# Patient Record
Sex: Female | Born: 1980
Health system: Southern US, Community
[De-identification: ages and names within clinical notes are randomized; demographics above are authoritative.]

## PROBLEM LIST (undated history)

## (undated) DIAGNOSIS — M940 Chondrocostal junction syndrome [Tietze]: Secondary | ICD-10-CM

## (undated) DIAGNOSIS — S83249A Other tear of medial meniscus, current injury, unspecified knee, initial encounter: Secondary | ICD-10-CM

## (undated) DIAGNOSIS — K921 Melena: Secondary | ICD-10-CM

## (undated) DIAGNOSIS — K649 Unspecified hemorrhoids: Secondary | ICD-10-CM

## (undated) DIAGNOSIS — F329 Major depressive disorder, single episode, unspecified: Secondary | ICD-10-CM

## (undated) DIAGNOSIS — F32A Depression, unspecified: Secondary | ICD-10-CM

## (undated) DIAGNOSIS — S83519A Sprain of anterior cruciate ligament of unspecified knee, initial encounter: Secondary | ICD-10-CM

## (undated) DIAGNOSIS — R590 Localized enlarged lymph nodes: Secondary | ICD-10-CM

## (undated) DIAGNOSIS — Z9289 Personal history of other medical treatment: Secondary | ICD-10-CM

## (undated) DIAGNOSIS — D509 Iron deficiency anemia, unspecified: Secondary | ICD-10-CM

## (undated) DIAGNOSIS — D649 Anemia, unspecified: Secondary | ICD-10-CM

## (undated) DIAGNOSIS — R112 Nausea with vomiting, unspecified: Secondary | ICD-10-CM

## (undated) DIAGNOSIS — K922 Gastrointestinal hemorrhage, unspecified: Secondary | ICD-10-CM

## (undated) DIAGNOSIS — Z8719 Personal history of other diseases of the digestive system: Secondary | ICD-10-CM

## (undated) DIAGNOSIS — Z9889 Other specified postprocedural states: Secondary | ICD-10-CM

## (undated) DIAGNOSIS — F419 Anxiety disorder, unspecified: Secondary | ICD-10-CM

## (undated) DIAGNOSIS — Z973 Presence of spectacles and contact lenses: Secondary | ICD-10-CM

## (undated) DIAGNOSIS — N939 Abnormal uterine and vaginal bleeding, unspecified: Secondary | ICD-10-CM

## (undated) HISTORY — PX: HYSTEROSCOPY WITH D & C: SHX1775

## (undated) HISTORY — PX: MULTIPLE TOOTH EXTRACTIONS: SHX2053

## (undated) HISTORY — DX: Anemia, unspecified: D64.9

## (undated) HISTORY — PX: KNEE ARTHROSCOPY WITH ANTERIOR CRUCIATE LIGAMENT (ACL) REPAIR: SHX5644

## (undated) HISTORY — DX: Gastrointestinal hemorrhage, unspecified: K92.2

## (undated) NOTE — *Deleted (*Deleted)
HEMATOLOGY/ONCOLOGY CONSULTATION NOTE  Date of Service: 04/18/2020  Patient Care Team: Cain Saupe, MD as PCP - General (Family Medicine)  CHIEF COMPLAINTS/PURPOSE OF CONSULTATION:  Pelvic Lymphadenopathy  HISTORY OF PRESENTING ILLNESS:   Leah Baker is a wonderful 42 y.o. female who has been referred to [place] by Dr Andrey Farmer for evaluation and management of pelvic lymphadenopathy. The pt reports that she is doing well overall.   The pt reports that her pelvic lymphadenopathy was found incidentally while she was being checked for other abdominal pains. In February pt was seeing blood in her stools and had to be hospitalized due to severe blood loss. Pt's 04/20 Colonoscopy revealed that she did have internal hemorrhoids that were bleeding. Dr. Levora Angel did not think that the pt needed an endoscopy because they believe that her internal hemorrhoid is the sole source of her GI bleeding. She also has a history of menorrhagia, for which she has had to have multiple blood transfusions. Dr. Andrey Farmer started her on Megace in July, her dosage has since been increased. Pt's pelvic region is sensitive to touch and the pain is constant. She reports that the pain is centralized in her hip and does not feel like it's going down her leg. Pt denies right leg swelling or injuries to the right side/leg. It does not hurt when she lifts her leg up, down or out. She does note mild pain when she moves her leg in, towards the center of her body. Heat helps ease the pain, so she often walks around with a heating pad and intercourse makes the pain worse. Dr. Delford Field has given her no indication of what is causing her pain. Pt also experiences non-radiating lower back pain that could be related. Pt had a chest CT in 11/2018 due to chest pains after she was moving heavy objects around but nothing of note was found. Her chest pains have since resolved. Pt reports some hematuria but denies any history of UTIs. She is planning on  seeing a Urologist soon. She has no family history of any autoimmune conditions but does have a large family history of cancer.   Pt can not take Ibuprofen due to bleeding concerns so she has had to take several different pain medications. She was last on Vicodin which helped but she has since run out. Dr. Andrey Farmer was trying to discern if most of her pain was due to her menstrual cramps and wanted to do an endometrial ablation but was told to wait until more is discovered about pt's inguinal lymphadenopathy. Pt has been taking Ferrous Sulfate 4x per day and Metamucil. Pt has no issues passing her bowels. Pt was given Amoxicillin & another antibiotic for about 7 days. She was given no reason why they were giving her antibiotics and it did not help her pain.   She reports that she has had a low-grade fever since Thursday and has had very bad night sweats for the past month. The night sweats appear to be due to the increase in her Megace dosing. She has not had any abnormal vaginal discharge or any boils or bumps in her right pelvic region. Pt has not noticed any lymph node growth or discomfort in the cervical region. She has been helping her children with their at-home schooling and has some new stress due to her father's medical concerns.   Of note prior to the patient's visit today, pt has had Pelvis MRI ([id number]) completed on 02/22/2019 with results revealing "Persistent right inguinal  and iliac adenopathy, previously biopsied and within 1-2 mm of size when compared to 09/14/2018. Significance uncertain given unilateral enlargement, unusual for pelvic nodes with reactive changes. Imaging appearance remains nonspecific but again raises the question of lymphoproliferative disorder. Correlate with previous biopsy results with imaging and clinical follow-up as warranted. Mass on ultrasound likely represented pelvic sidewall/external iliac nodal enlargement on the right."   Pt has had US Pelvic ([id number])  completed on 12/16/2018 with results revealing "1. RIGHT adnexal mass, discrete from the RIGHT ovary is 1.9 centimeters and avascular on Doppler evaluation. This likely represents enlarged RIGHT external iliac lymph node as seen on previous CT exams of the abdomen and pelvis 09/14/2018 and 08/31/2018. 2. Given the persistence of RIGHT iliac lymph node enlargement, consider biopsy of RIGHT inguinal lymph node, seen on prior CT exam. 3. Normal endometrial thickness. If bleeding remains unresponsive to hormonal or medical therapy, sonohysterogram should be considered for focal lesion work-up. (Ref: Radiological Reasoning: Algorithmic  Workup of Abnormal Vaginal Bleeding with Endovaginal Sonography and Sonohysterography. AJR 2008; [id number]:[id number])."  Pt has had Lymph node biopsy (right groin) ([id number]) completed on 09/29/2018 with results revealing "LYMPHOID HYPERPLASIA."  Most recent lab results (02/10/2019) of CBC w/diff is as follows: all values are WNL except for RDW at 18.3, PLTs at 457K. 12/08/2018 BMP is as follows: all values are WNL except for CO2 at 18.   On review of systems, pt reports fevers, night sweats, pelvic pain and denies chills, SOB, coughing, runny nose, bloody stools, new lumps/bumps, abdominal pain, leg swelling, calf pain and any other symptoms.   On PMHx the pt reports menorrhagia, lower GI bleeding On Family Hx the pt reports her great aunt passed from liver cancer, her cousin currently has lung cancer, her mother had a mass in her breast (Removed)  INTERVAL HISTORY: Leah Baker is a wonderful 48 y.o. female who is here for evaluation and management of pelvic lymphadenopathy. The patient's last visit with [place] was on 02/28/2019. The pt reports that she is doing well overall.  The pt reports ***  Of note since the patient's last visit, pt has had MRI Abd ([id number]) completed on 02/29/2020 with results revealing "Stable 1.5 cm mass in segment 2 of the left hepatic lobe, with  characteristics suggestive of benign focal nodular hyperplasia."  Pt has had MRI Pel ([id number]) completed on 03/20/2020 with results revealing "1. Stable enlarged right-sided pelvic and inguinal lymph nodes. 2. No new or progressive findings."  Pt has had Flow Pathology Report ([id number]) completed on 04/12/2020 with results revealing "No monoclonal B-cell population or abnormal T-cell phenotype identified."  Pt has also had Right Inguinal LN Surgical Pathology [id number]) completed on 04/12/2020 with results revealing "Florid lymphoid hyperplasia."  Lab results today (04/19/20) of CBC w/diff and CMP is as follows: all values are WNL except for ***. 04/19/2020 Ferritin at *** 04/19/2020 Iron Panel is as follows: ***  On review of systems, pt reports *** and denies *** and any other symptoms.   A&P: -Discussed pt labwork today, 04/19/20; *** -***   MEDICAL HISTORY:  Past Medical History:  Diagnosis Date  . Acute costochondritis 11/08/2018   anterior chest wall pain  per pt pcp note in epic on 11-10-2018  . Anxiety   . Costochondritis, acute 11/10/2018  . Depression   . Hemorrhoids   . History of lower GI bleeding 08/31/2018   s/p  colonoscopy 09-01-2018,  hemorrhoids  . IDA (iron deficiency anemia)   . Inguinal lymphadenopathy  CT 09-14-2018  right side, followed by pcp  . PONV (postoperative nausea and vomiting)    only happened once  . Wears glasses     SURGICAL HISTORY: Past Surgical History:  Procedure Laterality Date  . BIOPSY  09/01/2018   Procedure: BIOPSY;  Surgeon: Kathi Der, MD;  Location: [facility];  Service: Gastroenterology;;  . CESAREAN SECTION  01/15/2002   @[facility]   . CESAREAN SECTION  03/17/2011   Procedure: CESAREAN SECTION;  Surgeon: Tereso Newcomer, MD;  Location: [facility];  Service: Gynecology;  Laterality: N/A;  . COLONOSCOPY WITH PROPOFOL N/A 09/01/2018   Procedure: COLONOSCOPY WITH PROPOFOL;  Surgeon: Kathi Der, MD;  Location:  [facility];  Service: Gastroenterology;  Laterality: N/A;  . HYSTEROSCOPY WITH D & C  06-21-2009  dr constant @[facility]    w/  REMOVAL IUD   . INGUINAL LYMPH NODE BIOPSY Right 04/12/2020   Procedure: RIGHT INGUINAL LYMPH NODE EXCISIONAL BIOPSY;  Surgeon: Almond Lint, MD;  Location: [facility] OR;  Service: General;  Laterality: Right;  . IUD REMOVAL  06/21/2009  . KNEE ARTHROSCOPY WITH ANTERIOR CRUCIATE LIGAMENT (ACL) REPAIR Left 09-08-2013   dr Eulah Pont  @[facility]    w/  meniscal repair  . MULTIPLE TOOTH EXTRACTIONS    . RECTAL EXAM UNDER ANESTHESIA N/A 11/18/2018   Procedure: ANORECTAL EXAM UNDER ANESTHESIA;  Surgeon: Andria Meuse, MD;  Location: [facility];  Service: General;  Laterality: N/A;  . TUBAL LIGATION Bilateral 2012    SOCIAL HISTORY: Social History   Socioeconomic History  . Marital status: Significant Other    Spouse name: Not on file  . Number of children: Not on file  . Years of education: Not on file  . Highest education level: Not on file  Occupational History  . Not on file  Tobacco Use  . Smoking status: Former Smoker    Years: 5.00    Types: Cigarettes    Quit date: 08/22/2008    Years since quitting: 11.6  . Smokeless tobacco: Never Used  Vaping Use  . Vaping Use: Never used  Substance and Sexual Activity  . Alcohol use: Yes    Comment: occasionally  . Drug use: Not Currently    Comment: per pt younger in college marijuana, none since  . Sexual activity: Yes    Birth control/protection: Surgical  Other Topics Concern  . Not on file  Social History Narrative  . Not on file   Social Determinants of Health   Financial Resource Strain:   . Difficulty of Paying Living Expenses: Not on file  Food Insecurity:   . Worried About Programme researcher, broadcasting/film/video in the Last Year: Not on file  . Ran Out of Food in the Last Year: Not on file  Transportation Needs:   . Lack of Transportation (Medical): Not on file  . Lack of Transportation (Non-Medical): Not on  file  Physical Activity:   . Days of Exercise per Week: Not on file  . Minutes of Exercise per Session: Not on file  Stress:   . Feeling of Stress : Not on file  Social Connections:   . Frequency of Communication with Friends and Family: Not on file  . Frequency of Social Gatherings with Friends and Family: Not on file  . Attends Religious Services: Not on file  . Active Member of Clubs or Organizations: Not on file  . Attends Banker Meetings: Not on file  . Marital Status: Not on file  Intimate Partner Violence:   .  Fear of Current or Ex-Partner: Not on file  . Emotionally Abused: Not on file  . Physically Abused: Not on file  . Sexually Abused: Not on file    FAMILY HISTORY: Family History  Problem Relation Age of Onset  . Cancer Mother   . Kidney failure Mother   . Healthy Father     ALLERGIES:  is allergic to latex.  MEDICATIONS:  Current Outpatient Medications  Medication Sig Dispense Refill  . acetaminophen (TYLENOL) 500 MG tablet Take 1 tablet (500 mg total) by mouth every 6 (six) hours as needed. (Patient not taking: Reported on 03/28/2020) 30 tablet 0  . megestrol (MEGACE) 40 MG tablet Take 2 tablets (80 mg total) by mouth 2 (two) times daily. (Patient taking differently: Take 80 mg by mouth in the morning, at noon, and at bedtime. ) 120 tablet 2  . Menthol-Camphor (TIGER BALM ARTHRITIS RUB EX) Apply 1 application topically daily as needed (knee pain).    . Multiple Vitamins-Minerals (WOMENS MULTI VITAMIN & MINERAL PO) Take 1 tablet by mouth daily.     Marland Kitchen oxyCODONE (OXY IR/ROXICODONE) 5 MG immediate release tablet Take 1 tablet (5 mg total) by mouth every 6 (six) hours as needed for severe pain. 15 tablet 0  . psyllium (HYDROCIL/METAMUCIL) 95 % PACK Take 1 packet by mouth daily.     No current facility-administered medications for this visit.   Facility-Administered Medications Ordered in Other Visits  Medication Dose Route Frequency Provider Last Rate  Last Admin  . bupivacaine liposome (EXPAREL) 1.3 % injection 266 mg  20 mL Infiltration Once Almond Lint, MD        REVIEW OF SYSTEMS:   A 10+ POINT REVIEW OF SYSTEMS WAS OBTAINED including neurology, dermatology, psychiatry, cardiac, respiratory, lymph, extremities, GI, GU, Musculoskeletal, constitutional, breasts, reproductive, HEENT.  All pertinent positives are noted in the HPI.  All others are negative.   PHYSICAL EXAMINATION: ECOG PERFORMANCE STATUS: 2 - Symptomatic, <50% confined to bed  . There were no vitals filed for this visit. There were no vitals filed for this visit. .There is no height or weight on file to calculate BMI.  *** GENERAL:alert, in no acute distress and comfortable SKIN: no acute rashes, no significant lesions EYES: conjunctiva are pink and non-injected, sclera anicteric OROPHARYNX: MMM, no exudates, no oropharyngeal erythema or ulceration NECK: supple, no JVD LYMPH:  no palpable lymphadenopathy in the cervical, axillary or inguinal regions ***Pt has 1-1.5 inch lymph node in the inguinal region. LUNGS: clear to auscultation b/l with normal respiratory effort HEART: regular rate & rhythm ABDOMEN:  normoactive bowel sounds , non tender, not distended. No palpable hepatosplenomegaly.  Extremity: no pedal edema PSYCH: alert & oriented x 3 with fluent speech NEURO: no focal motor/sensory deficits  LABORATORY DATA:  I have reviewed the data as listed  . CBC Latest Ref Rng & Units 04/05/2020 11/13/2019 05/24/2019  WBC 4.0 - 10.5 K/uL 5.8 6.1 6.7  Hemoglobin 12.0 - 15.0 g/dL 10.7(L) 12.3 6.5(LL)  Hematocrit 36 - 46 % 35.1(L) 40.0 21.3(L)  Platelets 150 - 400 K/uL 500(H) 539(H) 656(H)    . CMP Latest Ref Rng & Units 03/28/2020 11/13/2019 05/24/2019  Glucose 65 - 99 mg/dL 91 98 89  BUN 6 - 24 mg/dL 13 8 9   Creatinine 0.57 - 1.00 mg/dL 1.61(W) 9.60 4.54  Sodium 134 - 144 mmol/L 136 138 139  Potassium 3.5 - 5.2 mmol/L 4.2 4.3 3.6  Chloride 96 - 106 mmol/L  105 109 111  CO2 20 - 29 mmol/L 18(L) 21(L) 20(L)  Calcium 8.7 - 10.2 mg/dL 9.8 9.6 1.6(X)  Total Protein 6.5 - 8.1 g/dL - - 6.0(L)  Total Bilirubin 0.3 - 1.2 mg/dL - - 0.3  Alkaline Phos 38 - 126 U/L - - 40  AST 15 - 41 U/L - - 18  ALT 0 - 44 U/L - - 15   04/12/2020 Right Inguinal LN Surgical Pathology [id number]):   04/12/2020 Flow Pathology Report ([id number]):   09/29/2018 Lymph node biopsy    RADIOGRAPHIC STUDIES: I have personally reviewed the radiological images as listed and agreed with the findings in the report. MR PELVIS W WO CONTRAST  Result Date: 03/20/2020 CLINICAL DATA:  Follow-up right-sided pelvic adenopathy. EXAM: MRI PELVIS WITHOUT AND WITH CONTRAST TECHNIQUE: Multiplanar multisequence MR imaging of the pelvis was performed both before and after administration of intravenous contrast. CONTRAST:  16mL MULTIHANCE GADOBENATE DIMEGLUMINE 529 MG/ML IV SOLN COMPARISON:  MRI pelvis 02/22/2019 FINDINGS: Urinary Tract: The bladder is unremarkable. No bladder mass or calculi. Bowel: The rectum, sigmoid colon and visualized small bowel loops are unremarkable. Vascular/Lymphatic: Stable enlarged right-sided pelvic and inguinal lymph nodes. Stable 9 mm right common iliac node on image 4/6. Stable 9 mm right external iliac lymph node on image 14/6. Stable 11 mm right obturator lymph node on image 18/6. Stable 2 cm right inguinal lymph node on image number 29/6. No new or progressive findings are identified. The stability would suggest a benign process. Reproductive:  The uterus and ovaries are unremarkable. Other: No free pelvic fluid collections. No pelvic mass. Artifact from tubal ligation clips are noted. Musculoskeletal: No significant bony findings. IMPRESSION: 1. Stable enlarged right-sided pelvic and inguinal lymph nodes. 2. No new or progressive findings. Electronically Signed   By: Rudie Meyer [name]   On: 03/20/2020 12:36    ASSESSMENT & PLAN:   78 yo with   1) Rt  inguiinal and iliac lymphadenopathy of unclear etiology 09/29/2018 Lymph node biopsy (right groin) ([id number]) which revealed "LYMPHOID HYPERPLASIA."  PLAN: *** -Does not appear that the inguinal lymphadenopathy is the cause of pt's pelvic pain -Will refer to [facility] for inguinal lymph node biopsy - to elucidate definitive etiology for her lymphadenopathy  2) Iron deficiency anemia Ferritin improved from 4 to 28. hgb wnl at 13.1 Plan -continue po iron per PCP  FOLLOW UP: ***   The total time spent in the appt was *** minutes and more than 50% was on counseling and direct patient cares.  All of the patient's questions were answered with apparent satisfaction. The patient knows to call the clinic with any problems, questions or concerns.    Wyvonnia Lora MD MS AAHIVMS [facility] Hematology/Oncology Physician [facility]  (Office):       [phone] (Work cell):  [phone] (Fax):           [phone]  04/18/2020 12:02 PM  I, Carollee Herter, am acting as a scribe for Dr. Wyvonnia Lora.   {Add Production assistant, radio Statement}

---
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
## 2001-02-04 ENCOUNTER — Inpatient Hospital Stay (HOSPITAL_COMMUNITY): Admission: AD | Admit: 2001-02-04 | Discharge: 2001-02-04 | Payer: Self-pay | Admitting: Obstetrics & Gynecology

## 2001-08-01 ENCOUNTER — Inpatient Hospital Stay (HOSPITAL_COMMUNITY): Admission: AD | Admit: 2001-08-01 | Discharge: 2001-08-01 | Payer: Self-pay | Admitting: Obstetrics and Gynecology

## 2001-08-03 ENCOUNTER — Encounter: Payer: Self-pay | Admitting: Obstetrics and Gynecology

## 2001-08-03 ENCOUNTER — Ambulatory Visit (HOSPITAL_COMMUNITY): Admission: RE | Admit: 2001-08-03 | Discharge: 2001-08-03 | Payer: Self-pay | Admitting: Obstetrics and Gynecology

## 2001-09-08 ENCOUNTER — Encounter: Admission: RE | Admit: 2001-09-08 | Discharge: 2001-09-08 | Payer: Self-pay | Admitting: Family Medicine

## 2001-09-24 ENCOUNTER — Encounter: Admission: RE | Admit: 2001-09-24 | Discharge: 2001-09-24 | Payer: Self-pay | Admitting: Family Medicine

## 2001-09-24 ENCOUNTER — Other Ambulatory Visit: Admission: RE | Admit: 2001-09-24 | Discharge: 2001-09-24 | Payer: Self-pay | Admitting: Family Medicine

## 2001-10-25 ENCOUNTER — Encounter: Admission: RE | Admit: 2001-10-25 | Discharge: 2001-10-25 | Payer: Self-pay | Admitting: Family Medicine

## 2001-11-12 ENCOUNTER — Encounter: Admission: RE | Admit: 2001-11-12 | Discharge: 2001-11-12 | Payer: Self-pay | Admitting: Family Medicine

## 2001-11-29 ENCOUNTER — Encounter: Admission: RE | Admit: 2001-11-29 | Discharge: 2001-11-29 | Payer: Self-pay | Admitting: Family Medicine

## 2001-12-17 ENCOUNTER — Encounter: Admission: RE | Admit: 2001-12-17 | Discharge: 2001-12-17 | Payer: Self-pay | Admitting: Family Medicine

## 2001-12-30 ENCOUNTER — Encounter: Admission: RE | Admit: 2001-12-30 | Discharge: 2001-12-30 | Payer: Self-pay | Admitting: Family Medicine

## 2002-01-03 ENCOUNTER — Inpatient Hospital Stay (HOSPITAL_COMMUNITY): Admission: AD | Admit: 2002-01-03 | Discharge: 2002-01-03 | Payer: Self-pay | Admitting: *Deleted

## 2002-01-05 ENCOUNTER — Encounter: Admission: RE | Admit: 2002-01-05 | Discharge: 2002-01-05 | Payer: Self-pay | Admitting: Family Medicine

## 2002-01-12 ENCOUNTER — Encounter: Admission: RE | Admit: 2002-01-12 | Discharge: 2002-01-12 | Payer: Self-pay | Admitting: Family Medicine

## 2002-01-15 ENCOUNTER — Inpatient Hospital Stay (HOSPITAL_COMMUNITY): Admission: AD | Admit: 2002-01-15 | Discharge: 2002-01-18 | Payer: Self-pay | Admitting: Family Medicine

## 2002-04-04 ENCOUNTER — Encounter: Admission: RE | Admit: 2002-04-04 | Discharge: 2002-04-04 | Payer: Self-pay | Admitting: Family Medicine

## 2002-04-22 ENCOUNTER — Emergency Department (HOSPITAL_COMMUNITY): Admission: EM | Admit: 2002-04-22 | Discharge: 2002-04-22 | Payer: Self-pay | Admitting: Emergency Medicine

## 2002-05-04 ENCOUNTER — Emergency Department (HOSPITAL_COMMUNITY): Admission: EM | Admit: 2002-05-04 | Discharge: 2002-05-04 | Payer: Self-pay | Admitting: Emergency Medicine

## 2002-05-11 ENCOUNTER — Encounter: Admission: RE | Admit: 2002-05-11 | Discharge: 2002-05-11 | Payer: Self-pay | Admitting: Family Medicine

## 2002-05-24 ENCOUNTER — Encounter: Admission: RE | Admit: 2002-05-24 | Discharge: 2002-05-24 | Payer: Self-pay | Admitting: Family Medicine

## 2002-07-11 ENCOUNTER — Encounter: Admission: RE | Admit: 2002-07-11 | Discharge: 2002-07-11 | Payer: Self-pay | Admitting: Family Medicine

## 2002-11-01 ENCOUNTER — Other Ambulatory Visit: Admission: RE | Admit: 2002-11-01 | Discharge: 2002-11-01 | Payer: Self-pay | Admitting: Family Medicine

## 2002-11-01 ENCOUNTER — Encounter: Admission: RE | Admit: 2002-11-01 | Discharge: 2002-11-01 | Payer: Self-pay | Admitting: Sports Medicine

## 2003-02-13 ENCOUNTER — Emergency Department (HOSPITAL_COMMUNITY): Admission: EM | Admit: 2003-02-13 | Discharge: 2003-02-13 | Payer: Self-pay | Admitting: Emergency Medicine

## 2003-02-15 ENCOUNTER — Encounter: Admission: RE | Admit: 2003-02-15 | Discharge: 2003-02-15 | Payer: Self-pay | Admitting: Sports Medicine

## 2003-02-23 ENCOUNTER — Encounter: Admission: RE | Admit: 2003-02-23 | Discharge: 2003-02-23 | Payer: Self-pay | Admitting: Family Medicine

## 2003-06-16 ENCOUNTER — Encounter: Admission: RE | Admit: 2003-06-16 | Discharge: 2003-06-16 | Payer: Self-pay | Admitting: Family Medicine

## 2003-07-05 ENCOUNTER — Encounter: Admission: RE | Admit: 2003-07-05 | Discharge: 2003-07-05 | Payer: Self-pay | Admitting: Family Medicine

## 2003-07-19 ENCOUNTER — Encounter: Admission: RE | Admit: 2003-07-19 | Discharge: 2003-07-19 | Payer: Self-pay | Admitting: Family Medicine

## 2003-07-21 ENCOUNTER — Encounter: Admission: RE | Admit: 2003-07-21 | Discharge: 2003-07-21 | Payer: Self-pay | Admitting: Family Medicine

## 2003-08-02 ENCOUNTER — Encounter: Admission: RE | Admit: 2003-08-02 | Discharge: 2003-08-02 | Payer: Self-pay | Admitting: Family Medicine

## 2003-08-13 ENCOUNTER — Emergency Department (HOSPITAL_COMMUNITY): Admission: EM | Admit: 2003-08-13 | Discharge: 2003-08-13 | Payer: Self-pay

## 2003-08-15 ENCOUNTER — Emergency Department (HOSPITAL_COMMUNITY): Admission: EM | Admit: 2003-08-15 | Discharge: 2003-08-15 | Payer: Self-pay | Admitting: Emergency Medicine

## 2003-12-21 ENCOUNTER — Inpatient Hospital Stay (HOSPITAL_COMMUNITY): Admission: AD | Admit: 2003-12-21 | Discharge: 2003-12-21 | Payer: Self-pay | Admitting: *Deleted

## 2003-12-26 ENCOUNTER — Encounter: Admission: RE | Admit: 2003-12-26 | Discharge: 2003-12-26 | Payer: Self-pay | Admitting: Sports Medicine

## 2003-12-26 ENCOUNTER — Other Ambulatory Visit: Admission: RE | Admit: 2003-12-26 | Discharge: 2003-12-26 | Payer: Self-pay | Admitting: Family Medicine

## 2004-01-02 ENCOUNTER — Encounter: Admission: RE | Admit: 2004-01-02 | Discharge: 2004-01-02 | Payer: Self-pay | Admitting: Family Medicine

## 2004-03-12 ENCOUNTER — Ambulatory Visit: Payer: Self-pay | Admitting: Sports Medicine

## 2004-03-19 ENCOUNTER — Ambulatory Visit: Payer: Self-pay | Admitting: Sports Medicine

## 2004-06-21 ENCOUNTER — Ambulatory Visit: Payer: Self-pay | Admitting: Family Medicine

## 2004-10-23 ENCOUNTER — Ambulatory Visit: Payer: Self-pay | Admitting: Family Medicine

## 2004-11-27 ENCOUNTER — Ambulatory Visit: Payer: Self-pay | Admitting: Family Medicine

## 2004-12-14 ENCOUNTER — Emergency Department (HOSPITAL_COMMUNITY): Admission: EM | Admit: 2004-12-14 | Discharge: 2004-12-14 | Payer: Self-pay | Admitting: Emergency Medicine

## 2005-01-31 ENCOUNTER — Ambulatory Visit: Payer: Self-pay | Admitting: Family Medicine

## 2005-04-17 ENCOUNTER — Emergency Department (HOSPITAL_COMMUNITY): Admission: EM | Admit: 2005-04-17 | Discharge: 2005-04-17 | Payer: Self-pay | Admitting: Emergency Medicine

## 2005-07-24 ENCOUNTER — Emergency Department (HOSPITAL_COMMUNITY): Admission: EM | Admit: 2005-07-24 | Discharge: 2005-07-24 | Payer: Self-pay | Admitting: Emergency Medicine

## 2005-07-31 ENCOUNTER — Encounter (INDEPENDENT_AMBULATORY_CARE_PROVIDER_SITE_OTHER): Payer: Self-pay | Admitting: *Deleted

## 2005-08-13 ENCOUNTER — Ambulatory Visit: Payer: Self-pay | Admitting: Family Medicine

## 2005-08-15 ENCOUNTER — Ambulatory Visit: Payer: Self-pay | Admitting: Sports Medicine

## 2005-08-27 ENCOUNTER — Ambulatory Visit: Payer: Self-pay | Admitting: Family Medicine

## 2005-08-27 ENCOUNTER — Encounter: Admission: RE | Admit: 2005-08-27 | Discharge: 2005-08-27 | Payer: Self-pay | Admitting: Sports Medicine

## 2005-09-10 ENCOUNTER — Ambulatory Visit: Payer: Self-pay | Admitting: Family Medicine

## 2005-09-17 ENCOUNTER — Ambulatory Visit: Payer: Self-pay | Admitting: Family Medicine

## 2005-10-04 ENCOUNTER — Emergency Department (HOSPITAL_COMMUNITY): Admission: EM | Admit: 2005-10-04 | Discharge: 2005-10-04 | Payer: Self-pay | Admitting: Emergency Medicine

## 2006-01-30 ENCOUNTER — Emergency Department (HOSPITAL_COMMUNITY): Admission: EM | Admit: 2006-01-30 | Discharge: 2006-01-30 | Payer: Self-pay | Admitting: *Deleted

## 2006-05-10 ENCOUNTER — Emergency Department (HOSPITAL_COMMUNITY): Admission: EM | Admit: 2006-05-10 | Discharge: 2006-05-10 | Payer: Self-pay | Admitting: Emergency Medicine

## 2006-07-17 ENCOUNTER — Ambulatory Visit: Payer: Self-pay

## 2006-07-31 ENCOUNTER — Encounter (INDEPENDENT_AMBULATORY_CARE_PROVIDER_SITE_OTHER): Payer: Self-pay | Admitting: *Deleted

## 2006-09-11 ENCOUNTER — Emergency Department (HOSPITAL_COMMUNITY): Admission: EM | Admit: 2006-09-11 | Discharge: 2006-09-11 | Payer: Self-pay | Admitting: Emergency Medicine

## 2007-06-16 ENCOUNTER — Telehealth: Payer: Self-pay | Admitting: *Deleted

## 2007-06-18 ENCOUNTER — Emergency Department (HOSPITAL_COMMUNITY): Admission: EM | Admit: 2007-06-18 | Discharge: 2007-06-18 | Payer: Self-pay | Admitting: Emergency Medicine

## 2007-11-25 ENCOUNTER — Emergency Department (HOSPITAL_COMMUNITY): Admission: EM | Admit: 2007-11-25 | Discharge: 2007-11-25 | Payer: Self-pay | Admitting: Emergency Medicine

## 2008-02-23 ENCOUNTER — Ambulatory Visit: Payer: Self-pay | Admitting: Family Medicine

## 2008-02-23 ENCOUNTER — Encounter (INDEPENDENT_AMBULATORY_CARE_PROVIDER_SITE_OTHER): Payer: Self-pay | Admitting: Family Medicine

## 2008-02-23 ENCOUNTER — Other Ambulatory Visit: Admission: RE | Admit: 2008-02-23 | Discharge: 2008-02-23 | Payer: Self-pay | Admitting: Family Medicine

## 2008-02-23 DIAGNOSIS — N898 Other specified noninflammatory disorders of vagina: Secondary | ICD-10-CM | POA: Insufficient documentation

## 2008-02-23 DIAGNOSIS — N912 Amenorrhea, unspecified: Secondary | ICD-10-CM

## 2008-02-23 LAB — CONVERTED CEMR LAB
GC Probe Amp, Genital: NEGATIVE
Whiff Test: POSITIVE

## 2008-02-28 ENCOUNTER — Encounter (INDEPENDENT_AMBULATORY_CARE_PROVIDER_SITE_OTHER): Payer: Self-pay | Admitting: Family Medicine

## 2008-12-10 ENCOUNTER — Emergency Department (HOSPITAL_COMMUNITY): Admission: EM | Admit: 2008-12-10 | Discharge: 2008-12-10 | Payer: Self-pay | Admitting: Emergency Medicine

## 2009-02-15 ENCOUNTER — Telehealth: Payer: Self-pay | Admitting: Family Medicine

## 2009-02-20 ENCOUNTER — Ambulatory Visit: Payer: Self-pay | Admitting: Family Medicine

## 2009-02-20 DIAGNOSIS — R109 Unspecified abdominal pain: Secondary | ICD-10-CM | POA: Insufficient documentation

## 2009-02-20 LAB — CONVERTED CEMR LAB
Beta hcg, urine, semiquantitative: NEGATIVE
Bilirubin Urine: NEGATIVE
Glucose, Urine, Semiquant: NEGATIVE
Specific Gravity, Urine: >=1.03
WBC Urine, dipstick: NEGATIVE
pH: 6

## 2009-02-27 ENCOUNTER — Telehealth (INDEPENDENT_AMBULATORY_CARE_PROVIDER_SITE_OTHER): Payer: Self-pay | Admitting: *Deleted

## 2009-03-05 ENCOUNTER — Ambulatory Visit (HOSPITAL_COMMUNITY): Admission: RE | Admit: 2009-03-05 | Discharge: 2009-03-05 | Payer: Self-pay | Admitting: Family Medicine

## 2009-03-10 ENCOUNTER — Encounter (INDEPENDENT_AMBULATORY_CARE_PROVIDER_SITE_OTHER): Payer: Self-pay | Admitting: *Deleted

## 2009-03-10 ENCOUNTER — Emergency Department (HOSPITAL_COMMUNITY): Admission: EM | Admit: 2009-03-10 | Discharge: 2009-03-10 | Payer: Self-pay | Admitting: Emergency Medicine

## 2009-03-10 DIAGNOSIS — F17200 Nicotine dependence, unspecified, uncomplicated: Secondary | ICD-10-CM

## 2009-05-01 ENCOUNTER — Encounter: Payer: Self-pay | Admitting: Family Medicine

## 2009-05-01 ENCOUNTER — Ambulatory Visit: Payer: Self-pay | Admitting: Family Medicine

## 2009-05-01 LAB — CONVERTED CEMR LAB
AST: 23 units/L (ref 0–37)
Alkaline Phosphatase: 46 units/L (ref 39–117)
BUN: 8 mg/dL (ref 6–23)
Basophils Absolute: 0 10*3/uL (ref 0.0–0.1)
Creatinine, Ser: 0.88 mg/dL (ref 0.40–1.20)
Eosinophils Absolute: 0.1 10*3/uL (ref 0.0–0.7)
Eosinophils Relative: 1 % (ref 0–5)
HCT: 39.1 % (ref 36.0–46.0)
Ketones, urine, test strip: NEGATIVE
Lymphocytes Relative: 38 % (ref 12–46)
Neutrophils Relative %: 52 % (ref 43–77)
Nitrite: NEGATIVE
Platelets: 389 10*3/uL (ref 150–400)
Potassium: 3.8 meq/L (ref 3.5–5.3)
Protein, U semiquant: NEGATIVE
RDW: 13.2 % (ref 11.5–15.5)
Specific Gravity, Urine: 1.02
Urobilinogen, UA: 0.2

## 2009-05-24 ENCOUNTER — Ambulatory Visit: Payer: Self-pay | Admitting: Obstetrics and Gynecology

## 2009-06-21 ENCOUNTER — Ambulatory Visit (HOSPITAL_COMMUNITY): Admission: RE | Admit: 2009-06-21 | Discharge: 2009-06-21 | Payer: Self-pay | Admitting: Obstetrics and Gynecology

## 2009-06-21 ENCOUNTER — Ambulatory Visit: Payer: Self-pay | Admitting: Obstetrics and Gynecology

## 2009-06-21 HISTORY — PX: IUD REMOVAL: SHX5392

## 2009-06-21 HISTORY — PX: HYSTEROSCOPY W/D&C: SHX1775

## 2009-07-08 ENCOUNTER — Observation Stay (HOSPITAL_COMMUNITY): Admission: AD | Admit: 2009-07-08 | Discharge: 2009-07-09 | Payer: Self-pay | Admitting: Obstetrics and Gynecology

## 2009-07-08 ENCOUNTER — Ambulatory Visit: Payer: Self-pay | Admitting: Obstetrics and Gynecology

## 2009-07-26 ENCOUNTER — Telehealth: Payer: Self-pay | Admitting: Family Medicine

## 2009-07-30 ENCOUNTER — Encounter: Admission: RE | Admit: 2009-07-30 | Discharge: 2009-07-30 | Payer: Self-pay | Admitting: Family Medicine

## 2009-07-30 ENCOUNTER — Telehealth: Payer: Self-pay | Admitting: Family Medicine

## 2009-08-13 ENCOUNTER — Telehealth: Payer: Self-pay | Admitting: Family Medicine

## 2009-08-14 ENCOUNTER — Telehealth (INDEPENDENT_AMBULATORY_CARE_PROVIDER_SITE_OTHER): Payer: Self-pay | Admitting: *Deleted

## 2010-06-02 HISTORY — PX: MULTIPLE TOOTH EXTRACTIONS: SHX2053

## 2010-06-02 HISTORY — PX: TUBAL LIGATION: SHX77

## 2010-06-24 ENCOUNTER — Encounter: Payer: Self-pay | Admitting: Family Medicine

## 2010-07-02 NOTE — Progress Notes (Signed)
Summary: TRIAGE  Phone Note Call from Patient Call back at [phone]   Caller: fiance, Phil Call For: Nurse Reason for Call: Talk to Nurse Summary of Call: pt appt early April, but condition is worsening... would like to come in much sooner... vomiting increased  Initial call taken by: Vallarie Mare,  August 14, 2009 2:08 PM  Follow-up for Phone Call        Per Phil-Pt. needs a sooner appt, she is vomiting daily since August.  Pt. will see Mike Gip [facility] on 08-15-09 at 1:30pm. Michele Mcalpine will advise pt. of med.list/co-pay/cx.policy. He will have referring MD get records faxed.  Follow-up by: Laureen Ochs LPN,  August 14, 2009 2:31 PM

## 2010-07-02 NOTE — Progress Notes (Signed)
Summary: Referral  Phone Note Call from Patient Call back at Home Phone [phone]   Reason for Call: Talk to Nurse Summary of Call: has appt today for referral but sts has been seen for this problem 3 times and just wants the referral Initial call taken by: Knox Royalty,  August 13, 2009 8:39 AM  Follow-up for Phone Call        wants GI referral . states she has another 24hr episode Sunday. OV notes do indicate that pcp wrote if pt has no improvement, will refer to specialist  message to pcp to make the referral. told pt I will call her back Follow-up by: Golden Circle RN,  August 13, 2009 8:40 AM  Additional Follow-up for Phone Call Additional follow up Details #1::        I will send referral,s he does not have to come in for this Additional Follow-up by: Milinda Antis MD,  August 13, 2009 8:45 AM     Appended Document: Referral patient advised that RN will work on referral and call back.  Appended Document: Referral appointment scheduled 09/10/2009 at 3:30. patient notified.

## 2010-07-02 NOTE — Progress Notes (Signed)
Summary: referral  Phone Note Call from Patient Call back at [phone]   Caller: Patient Summary of Call: pt had IUD removed and is still in a lot of abd pain - wants to know if she can be referral to a specialist Initial call taken by: De Nurse,  July 26, 2009 2:45 PM  Follow-up for Phone Call        spoke with patient . she had IUD removed 06/21/2009. she had to be dilated and IUD  surgically removed because they could not find strings.  she has continued however with nausea and vomiting and abdominal pain off and on. On Super Bowl Sunday  (Feb 6 ) she had a bad bout of N & V and went back to [facility] . her last bout was this week on 07/24/2009. suggested she needs to come back in to follow up with PCP and appointment is scheduled for 08/13/2009 ( this is the first available appointment ). will send message to MD to ask if she wants to go ahead and make referral or if she wants patient to come back in first. Follow-up by: Theresia Lo RN,  July 26, 2009 3:08 PM  Additional Follow-up for Phone Call Additional follow up Details #1::        Continued abdominal pain with cramping, epsidoes of N/V. [facility] I plan to send for CT scan and then to GI  spoke with patient no change in symptoms s/p IUD removal Additional Follow-up by: Milinda Antis MD,  July 27, 2009 12:10 PM

## 2010-07-02 NOTE — Progress Notes (Signed)
  Phone Note Outgoing Call   Call placed by: Milinda Antis MD,  July 30, 2009 3:50 PM Details for Reason: CT results Summary of Call: I discussed with patient unclear cause of abdominal cramping episodes, still no change in stools but with emesis associated, Episodes are intermittant and no specific aura to bring them about. Neg CT scan, neg pelvic ultrasound, negative labs, s/p removal of IUD with no change in symptoms. Will give trial of anti-cholinergic for cramping for IBS, if no change in symptoms will refer to GI. Pt in agreement.    New/Updated Medications: LEVSIN 0.125 MG TABS (HYOSCYAMINE SULFATE) 1 by mouth  every 6 hours as needed abdominal cramps Prescriptions: ZOFRAN 4 MG TABS (ONDANSETRON HCL) 1 by mouth q6hrs as needed N/V  #30 x 1   Entered and Authorized by:   Milinda Antis MD   Signed by:   Milinda Antis MD on 07/30/2009   Method used:   Electronically to        [facility] [id number] (retail)       [address] [place], [place]  [address]       Ph: [phone]       Fax: [phone]   RxID:   [id number] LEVSIN 0.125 MG TABS (HYOSCYAMINE SULFATE) 1 by mouth  every 6 hours as needed abdominal cramps  #30 x 1   Entered and Authorized by:   Milinda Antis MD   Signed by:   Milinda Antis MD on 07/30/2009   Method used:   Electronically to        [facility] [id number] (retail)       [address] [place], [place]  [address]       Ph: [phone]       Fax: [phone]   RxID:   [id number]

## 2010-07-03 ENCOUNTER — Encounter: Payer: Self-pay | Admitting: *Deleted

## 2010-08-16 ENCOUNTER — Other Ambulatory Visit: Payer: Medicaid Other

## 2010-08-16 ENCOUNTER — Other Ambulatory Visit: Payer: Self-pay | Admitting: Family Medicine

## 2010-08-16 DIAGNOSIS — Z3480 Encounter for supervision of other normal pregnancy, unspecified trimester: Secondary | ICD-10-CM

## 2010-08-16 LAB — CONVERTED CEMR LAB
Basophils Absolute: 0 10*3/uL (ref 0.0–0.1)
Basophils Relative: 0 % (ref 0–1)
Eosinophils Absolute: 0.1 10*3/uL (ref 0.0–0.7)
Hepatitis B Surface Ag: NEGATIVE
Lymphs Abs: 2.7 10*3/uL (ref 0.7–4.0)
MCV: 84.6 fL (ref 78.0–100.0)
Neutrophils Relative %: 59 % (ref 43–77)
Platelets: 397 10*3/uL (ref 150–400)
RDW: 13.2 % (ref 11.5–15.5)
Rh Type: POSITIVE
WBC: 8.4 10*3/uL (ref 4.0–10.5)

## 2010-08-16 NOTE — Progress Notes (Signed)
Drew Prenatal Panel, HIV, Sickle Cell, and collected an OB urine for culture. AC

## 2010-08-17 LAB — OBSTETRIC PANEL
Antibody Screen: NEGATIVE
Basophils Relative: 0 % (ref 0–1)
Eosinophils Absolute: 0.1 10*3/uL (ref 0.0–0.7)
HCT: 31.8 % — ABNORMAL LOW (ref 36.0–46.0)
Hemoglobin: 10.5 g/dL — ABNORMAL LOW (ref 12.0–15.0)
MCH: 27.9 pg (ref 26.0–34.0)
MCHC: 33 g/dL (ref 30.0–36.0)
Monocytes Absolute: 0.7 10*3/uL (ref 0.1–1.0)
Monocytes Relative: 9 % (ref 3–12)
Rh Type: POSITIVE

## 2010-08-17 LAB — SICKLE CELL SCREEN: Sickle Cell Screen: NEGATIVE

## 2010-08-18 LAB — CBC
Hemoglobin: 12.3 g/dL (ref 12.0–15.0)
MCHC: 32.8 g/dL (ref 30.0–36.0)
RDW: 13.2 % (ref 11.5–15.5)

## 2010-08-18 LAB — CULTURE, OB URINE: Colony Count: NO GROWTH

## 2010-08-21 LAB — COMPREHENSIVE METABOLIC PANEL
Alkaline Phosphatase: 58 U/L (ref 39–117)
BUN: 13 mg/dL (ref 6–23)
CO2: 21 mEq/L (ref 19–32)
Chloride: 103 mEq/L (ref 96–112)
Creatinine, Ser: 0.83 mg/dL (ref 0.4–1.2)
GFR calc non Af Amer: >60 mL/min (ref >60)
Glucose, Bld: 86 mg/dL (ref 70–99)
Potassium: 3.2 mEq/L — ABNORMAL LOW (ref 3.5–5.1)
Total Bilirubin: 0.5 mg/dL (ref 0.3–1.2)

## 2010-08-21 LAB — DIFFERENTIAL
Basophils Absolute: 0 10*3/uL (ref 0.0–0.1)
Basophils Relative: 1 % (ref 0–1)
Lymphocytes Relative: 36 % (ref 12–46)
Neutro Abs: 3.3 10*3/uL (ref 1.7–7.7)

## 2010-08-21 LAB — CBC
HCT: 42.8 % (ref 36.0–46.0)
Hemoglobin: 13.9 g/dL (ref 12.0–15.0)
MCV: 88.3 fL (ref 78.0–100.0)
RBC: 4.85 MIL/uL (ref 3.87–5.11)
WBC: 6.5 10*3/uL (ref 4.0–10.5)

## 2010-08-21 LAB — WET PREP, GENITAL
Trich, Wet Prep: NONE SEEN
Yeast Wet Prep HPF POC: NONE SEEN

## 2010-08-21 LAB — GC/CHLAMYDIA PROBE AMP, GENITAL: GC Probe Amp, Genital: NEGATIVE

## 2010-08-23 ENCOUNTER — Other Ambulatory Visit (HOSPITAL_COMMUNITY)
Admission: RE | Admit: 2010-08-23 | Discharge: 2010-08-23 | Disposition: A | Discharge status: Home / Self Care | Payer: Medicaid Other | Source: Ambulatory Visit | Attending: Family Medicine | Admitting: Family Medicine

## 2010-08-23 ENCOUNTER — Ambulatory Visit (INDEPENDENT_AMBULATORY_CARE_PROVIDER_SITE_OTHER): Payer: Medicaid Other | Admitting: Family Medicine

## 2010-08-23 ENCOUNTER — Encounter: Payer: Self-pay | Admitting: Family Medicine

## 2010-08-23 DIAGNOSIS — O219 Vomiting of pregnancy, unspecified: Secondary | ICD-10-CM

## 2010-08-23 DIAGNOSIS — F1011 Alcohol abuse, in remission: Secondary | ICD-10-CM

## 2010-08-23 DIAGNOSIS — Z9889 Other specified postprocedural states: Secondary | ICD-10-CM

## 2010-08-23 DIAGNOSIS — O26899 Other specified pregnancy related conditions, unspecified trimester: Secondary | ICD-10-CM

## 2010-08-23 DIAGNOSIS — N898 Other specified noninflammatory disorders of vagina: Secondary | ICD-10-CM

## 2010-08-23 DIAGNOSIS — Z3480 Encounter for supervision of other normal pregnancy, unspecified trimester: Secondary | ICD-10-CM

## 2010-08-23 DIAGNOSIS — Z01419 Encounter for gynecological examination (general) (routine) without abnormal findings: Secondary | ICD-10-CM | POA: Insufficient documentation

## 2010-08-23 DIAGNOSIS — Z331 Pregnant state, incidental: Secondary | ICD-10-CM

## 2010-08-23 DIAGNOSIS — F1010 Alcohol abuse, uncomplicated: Secondary | ICD-10-CM

## 2010-08-23 DIAGNOSIS — A599 Trichomoniasis, unspecified: Secondary | ICD-10-CM

## 2010-08-23 DIAGNOSIS — Z98891 History of uterine scar from previous surgery: Secondary | ICD-10-CM

## 2010-08-23 DIAGNOSIS — D509 Iron deficiency anemia, unspecified: Secondary | ICD-10-CM

## 2010-08-23 DIAGNOSIS — F1210 Cannabis abuse, uncomplicated: Secondary | ICD-10-CM

## 2010-08-23 LAB — COMPREHENSIVE METABOLIC PANEL
ALT: 25 U/L (ref 0–35)
BUN: 13 mg/dL (ref 6–23)
CO2: 21 mEq/L (ref 19–32)
Calcium: 9.6 mg/dL (ref 8.4–10.5)
Chloride: 103 mEq/L (ref 96–112)
Creat: 0.65 mg/dL (ref 0.40–1.20)
Total Bilirubin: 0.2 mg/dL — ABNORMAL LOW (ref 0.3–1.2)

## 2010-08-23 LAB — POCT WET PREP (WET MOUNT)

## 2010-08-23 LAB — CBC
HCT: 31.4 % — ABNORMAL LOW (ref 36.0–46.0)
MCH: 28.6 pg (ref 26.0–34.0)
MCV: 84 fL (ref 78.0–100.0)
Platelets: 398 10*3/uL (ref 150–400)
RBC: 3.74 MIL/uL — ABNORMAL LOW (ref 3.87–5.11)
WBC: 8.4 10*3/uL (ref 4.0–10.5)

## 2010-08-23 LAB — POCT URINALYSIS DIPSTICK
Bilirubin, UA: NEGATIVE
Nitrite, UA: NEGATIVE
Protein, UA: NEGATIVE
Urobilinogen, UA: 0.2
pH, UA: 7

## 2010-08-23 LAB — RETICULOCYTES
ABS Retic: 33.7 10*3/uL (ref 19.0–186.0)
RBC.: 3.74 MIL/uL — ABNORMAL LOW (ref 3.87–5.11)
Retic Ct Pct: 0.9 % (ref 0.4–3.1)

## 2010-08-23 MED ORDER — PYRIDOXINE HCL 100 MG PO TABS: 100.0000 mg | ORAL_TABLET | Freq: Every day | ORAL | Status: DC

## 2010-08-23 NOTE — Patient Instructions (Signed)
Iron Deficiency Anemia There are many types of anemia. Iron deficiency anemia is the most common. Iron deficiency anemia is a decrease in the number of red blood cells caused by too little iron. Without enough iron, your body does not produce enough hemoglobin. Hemoglobin is a substance in red blood cells that carries oxygen to the body's tissues. Iron deficiency anemia may leave you tired and short of breath. CAUSES  Lack of iron in the diet.   This may be seen in infants and children, because there is little iron in milk.   This may be seen in adults who do not eat enough iron-rich foods.   This may be seen in pregnant or breastfeeding women who do not take iron supplements. There is a much higher need for iron intake at these times.   Poor absorption of iron, as seen with intestinal disorders, such as surgical removal of the small intestines or intestinal bypass.   Intestinal bleeding.   Heavy periods.  SYMPTOMS Mild anemia may not be noticeable. Symptoms may include:  Fatigue.   Headache.   Pale skin.   Weakness.   Shortness of breath.   Dizziness.   Cold hands and feet.   Fast or irregular heartbeat.  DIAGNOSIS Diagnosis requires a thorough evaluation and physical exam by your caregiver.  Blood tests are generally used to confirm iron deficiency anemia.   Additional tests may be done to find the underlying cause of your anemia. This may include:   Testing for blood in the stool (fecal occult blood test).   A procedure to see inside the colon and rectum (colonoscopy).   A procedure to see inside the esophagus and stomach (endoscopy).  TREATMENT  Correcting the cause of the iron deficiency is the first step.   Medicines, such as oral contraceptives, can make heavy menstrual flows lighter.   Antibiotics and other medicines can be used to treat peptic ulcers.   Surgery may be needed to remove a bleeding polyp, tumor, or fibroid.   Often, iron supplements  (ferrous sulfate) are taken.   For the best iron absorption, take these supplements with an empty stomach.   You may need to take the supplements with food if you cannot tolerate them on an empty stomach. Vitamin C improves the absorption of iron. Your caregiver might recommend taking your iron tablets with a glass of orange juice or vitamin C supplement.   Milk and antacids should not be taken at the same time as iron supplements. They may interfere with the absorption of iron.   Iron supplements can cause constipation. A stool softener is often recommended.   Pregnant and breastfeeding women will need to take extra iron, because their normal diet usually will not provide the required amount.   Patients who cannot tolerate iron by mouth can take it through a vein (intravenously) or by an injection into the muscle.  HOME CARE INSTRUCTIONS  Ask your dietitian for help with diet questions.   Take iron and vitamins as directed by your caregiver.   Eat a diet rich in iron. Eat liver, lean beef, whole-grain bread, eggs, dried fruit, and dark green, leafy vegetables.  SEEK IMMEDIATE MEDICAL CARE IF:  You have a fainting episode. Do not drive yourself. Call your local emergency services  (911 in [place] if no other help is available.   You have chest pain, nausea, or vomiting.   You develop severe or increased shortness of breath with activities.   You develop weakness or  increased thirst.   You have a rapid heartbeat.   You develop unexplained sweating or become lightheaded when getting up from a chair or bed.  MAKE SURE YOU:  Understand these instructions.   Will watch your condition.   Will get help right away if you are not doing well or get worse.  Document Released: 05/16/2000 Document Re-Released: 11/06/2009 [facility] Patient Information 2011 [place], [place].Marijuana Abuse and Chemical Dependency WHEN IS DRUG USE A PROBLEM? Problems related to drug use usually begin with  abuse of the substance and lead to dependency.  Abuse is repeated use of a drug with recurrent and significant negative consequences. Abuse happens anytime drug use is interfering with normal living activities including:   Failure to fulfill major obligations at work, school or home (poor work International aid/development worker, missing work or school and/or neglecting children and home).   Engaging in activities that are physically dangerous (driving a car or doing recreational activities such as swimming or rock climbing) while under the effects of the drug.   Recurrent drug-related legal problems (arrests for disorderly conduct or assault and battery).   Recurrent social or interpersonal problems caused or increased by the effects of the drug (arguments with family or friends, or physical fights).  Dependency has two parts.  1. You first develop an emotional/psychological dependence. Psychological dependence develops when your mind tells you that the drug is needed. You come to believe it helps you cope with life.  2. This is usually followed by physical dependence which has developed when continuing increases of drugs are required to get the same feeling or "high." This may result in:   Withdrawal symptoms such as shakes or tremors.   The substance being over a longer period of time than intended.   An ongoing desire, or unsuccessful effort to, cut down or control the use.   Greater amounts of time spent getting the drug, using the drug or recovering from the effects of the drug.   Important social, work or interests and activities are given up or reduced because or drug use.   Substance is used despite knowledge of ongoing physical (ulcers) or psychological (depression) problems.  SIGNS OF CHEMICAL DEPENDENCY:  Friends or family say there is a problem.   Fighting when using drugs.   Having blackouts (not remembering what you do while using).   Feel sick from using drugs but continue using.   Lie  about use or amounts of drugs used.   Need drugs to get you going.   Need drugs to relate to people or feel comfortable in social situations.   Use drugs to forget problems.  A "yes" answered to any of the above signs of chemical dependency indicates there are problems. The longer the use of drugs continues, the greater the problems will become. If there is a family history of drug or alcohol use it is best not to experiment with drugs. Experimentation leads to tolerance. Addiction is followed by dependency where drugs are now needed not just to get high but to feel normal. Addiction cannot be cured but it can be stopped. This often requires outside help and the care of professionals. Treatment centers are listed in the yellow pages under: Cocaine, Narcotics, and Alcoholics anonymous. Most hospitals and clinics can refer you to a specialized care center. WHAT IS MARIJUANA? Marijuana is a plant which grows wild all over the world. The plant contains many chemicals but the active ingredient of the plant is THC (tetrahydrocannabinol). This is responsible  for the "high" perceived by people using the drug. HOW IS MARIJUANA USED? Marijuana is smoked, eaten in brownies or any other food, and drank as a tea. WHAT ARE THE EFFECTS OF MARIJUANA? Marijuana is a nervous system depressant which slows the thinking process. Because of this effect, users think marijuana has a calming effect. Actually what happens is the air carrying tubules in the lung become relaxed and allow more oxygen to enter. This causes the user to feel high. The blood pressure falls so less blood reaches the brain and the heart speeds up. As the effects wear off the user becomes depressed. Some people become very paranoid during use. They feel as though people are out to get them. Periodic use can interfere with performance at school or work. Generally Marijuana use does not develop into a physical dependence, but it is very habit forming.  Marijuana is also seen as a gateway to use of harder drugs. Strong habits such as using Marijuana, as with all drugs and addictions, can only be helped by stopping use of all chemicals. This is hard but may save your life.  OTHER HEALTH RISKS OF MARIJUANA AND DRUG USE ARE:  The increased possibility of getting AIDS or hepatitis (liver inflammation).  HOW TO STAY DRUG FREE ONCE YOU HAVE QUIT USING:  Develop healthy activities and form friends who do not use drugs.   Stay away from the drug scene.   Tell the those who want you to use drugs you have other, better things to do.   Have ready excuses available about why you cannot use.   Attend 12-Step Meetings for support from other recovering people.  FOR MORE HELP OR INFORMATION CONTACT YOUR LOCAL CAREGIVER, CLINIC, HOSPITAL OR DIAL 1-800-MARIJUANA [phone]). Document Released: 05/16/2000 Document Re-Released: 02/26/2008 [facility] Patient Information 2011 [place], [place].Pregnancy If you are planning on getting pregnant, it is a good idea to make a preconception appointment with your care- giver to discuss having a healthy lifestyle before getting pregnant. Such as, diet, weight, exercise, taking prenatal vitamins especially folic acid (it helps prevent brain and spinal cord defects), avoiding alcohol, smoking and illegal drugs, medical problems (diabetes, convulsions), family history of genetic problems, working conditions and immunizations. It is better to have knowledge of these things and do something about them before getting pregnant. In your pregnancy, it is important to follow certain guidelines to have a healthy baby. It is very important to get good prenatal care and follow your caregiver's instructions. Prenatal care includes all the medical care you receive before your baby's birth. This helps to prevent problems during the pregnancy and childbirth. HOME CARE INSTRUCTIONS  Start your prenatal visits by the 12th week of pregnancy  or before when possible. They are usually scheduled monthly at first. They are more often in the last 2 months before delivery. It is important that you keep your caregiver's appointments and follow your caregiver's instructions regarding medication use, exercise, and diet.   During pregnancy, you are providing food for you and your baby. Eat a regular, well-balanced diet. Choose foods such as meat, fish, milk and other dairy products, vegetables, fruits, whole-grain breads and cereals. Your caregiver will inform you of the ideal weight gain depending on your current height and weight. Drink lots of liquids. Try to drink 8 glasses of water a day.   Alcohol is associated with a number of birth defects including fetal alcohol syndrome. It is best to avoid alcohol completely. Smoking will cause low birth rate and  prematurity. Use of alcohol and nicotine during your pregnancy also increases the chances that your child will be chemically dependent later in their life and may contribute to SIDS (Sudden Infant Death Syndrome).   Do not use illegal drugs.   Only take prescription or over-the-counter medications that are recommended by your caregiver. Other medications can cause genetic and physical problems in the baby.   Morning sickness can often be helped by keeping soda crackers at the bedside. Eat a couple before arising in the morning.   A sexual relationship may be continued until near the end of pregnancy if there are no other problems such as early (premature) leaking of amniotic fluid from the membranes, vaginal bleeding, painful intercourse or belly (abdominal) pain.   Exercise regularly. Check with your caregiver if you are unsure of the safety of some of your exercises.   Do not use hot tubs, steam rooms or saunas. These increase the risk of fainting or passing out and hurting yourself and the baby. Swimming is OK for exercise. Get plenty of rest, including afternoon naps when possible  especially in the third trimester.   Avoid toxic odors and chemicals.   Do not wear high heels. They may cause you to lose your balance and fall.   Do not lift over 5 pounds. If you do lift anything, lift with your legs and thighs not your back.   Avoid long trips, especially in the third trimester.   If you have to travel out of the city or state, take a copy of your medical records with you.  SEEK IMMEDIATE MEDICAL CARE IF:  You develop an unexplained oral temperature above 100.5, or as your caregiver suggests.   You have leaking of fluid from the vagina. If leaking membranes are suspected, take your temperature and inform your caregiver of this when you call.   There is vaginal spotting or bleeding. Notify your caregiver of the amount and how many pads are used.   You continue to feel sick to your stomach (nauseous) and have no relief from remedies suggested, or you throw up (vomit) blood or coffee ground like materials.   You develop upper abdominal pain.   You have round ligament discomfort in the lower abdominal area. This still must be evaluated by your caregiver.   You feel contractions of the uterus.   You do not feel the baby move, or there is less movement than before.   You have painful urination.   You have abnormal vaginal discharge.   You have persistent diarrhea.   You get a severe headache.   You have problems with your vision.   You develop muscle weakness.   You feel dizzy and faint.   You develop shortness of breath.   You develop chest pain.   You have back pain that travels down to your leg and feet.   You feel irregular or a very fast heartbeat.   You develop excessive weight gain in a short period of time (5 pounds in 3 to 5 days).   You are involved with a domestic violence situation.  Document Released: 05/19/2005 Document Re-Released: 08/13/2009 [facility] Patient Information 2011 [place], [place].

## 2010-08-24 LAB — GC/CHLAMYDIA PROBE AMP, GENITAL
Chlamydia, DNA Probe: NEGATIVE
GC Probe Amp, Genital: NEGATIVE

## 2010-08-25 DIAGNOSIS — F1210 Cannabis abuse, uncomplicated: Secondary | ICD-10-CM | POA: Insufficient documentation

## 2010-08-25 DIAGNOSIS — F1010 Alcohol abuse, uncomplicated: Secondary | ICD-10-CM | POA: Insufficient documentation

## 2010-08-25 LAB — CULTURE, OB URINE: Colony Count: 25000

## 2010-08-25 NOTE — Progress Notes (Signed)
  Subjective:    Patient ID: Leah Baker, female    [name]: 1980-11-21, 30 y.o.   MRN: [id number]  HPI 30 YO G4 P1021 here at approx 9wk 6 days gestation (based on sure LMP) here for new OB visit. Pt denies any acute issues or concerns. Overall stable home environment per pt w/ FOB being overall very supportive.  Pt does report recent sudden death of mother approx 1 month ago. Pt states that this was very stressful for her. No HI/SI, depressed mood per pt.   Drug Use:   1-Pt does report longstanding hx/o daily marijuana use. Pt was previously smoking 1-2 marijuana cigarettes per day. Now down to 1/4-1/2 joint per day. No cigarette use per pt.  2-Pt also reports longstanding hx/o chronic ETOH use. Previously drinking 1-2 beers and up to 1 shot per day of liquor. Pt states that she stopped drinking alcohol once she found out that she was pregnant at the beginning of March.  No other drug use per pt.  Pt currently taking prenatal vitamin daily. Daily emesis per  pt. Some intermittent dysuria, no fever or flank pain. Intermittent vaginal discharge per pt.  Discharge non-foul smelling.     Review of Systems See HPI     Objective:   Physical Exam Gen:up in bed, NAD HEENT: NCAT, EOMI, TMs clear bilaterally, good dentition  CV: RRR, no murmurs auscultated PULM: CTAB, no wheezes, rales, rhoncii ABD: S/NT/+ bowel sounds  GU: Normal external genitalia, no intravaginal lesions, + discharge EXT: 2+ peripheral pulses      Assessment & Plan:  30 YO G4 P1021 here for new OB visit. Reviewed 1st trimester labs. Noted anemia- Encouraged iron intake. Will recheck CBC today Drug Use: Discussed with pt importance of marijuana ETOH abstinence in setting of fetal (especially 1st trimester) growth. Also discussed possible relation of daily emesis with daily marijuana use. Pt agreeable to discontinuation of marijuana. Will also check CMET as to assess any hepatic dysfunction.  Nausea: Discussed Marijuana  abstinence. Will prescribe vitamin B6.  Pap, GC/Chl, Wet prep done today UA and repeat urine cx done today in setting of dysuria. Will perform urine cx if any indicative findings on UA.

## 2010-08-26 ENCOUNTER — Ambulatory Visit (HOSPITAL_COMMUNITY)
Admission: RE | Admit: 2010-08-26 | Discharge: 2010-08-26 | Disposition: A | Discharge status: Home / Self Care | Payer: Medicaid Other | Source: Ambulatory Visit | Attending: Family Medicine | Admitting: Family Medicine

## 2010-08-26 ENCOUNTER — Other Ambulatory Visit: Payer: Self-pay | Admitting: Family Medicine

## 2010-08-26 DIAGNOSIS — Z3689 Encounter for other specified antenatal screening: Secondary | ICD-10-CM | POA: Insufficient documentation

## 2010-08-26 DIAGNOSIS — Z331 Pregnant state, incidental: Secondary | ICD-10-CM

## 2010-08-27 MED ORDER — METRONIDAZOLE 500 MG PO TABS: ORAL_TABLET | ORAL | Status: AC

## 2010-08-28 ENCOUNTER — Encounter: Payer: Self-pay | Admitting: Family Medicine

## 2010-08-28 DIAGNOSIS — Z98891 History of uterine scar from previous surgery: Secondary | ICD-10-CM | POA: Insufficient documentation

## 2010-08-28 NOTE — Progress Notes (Signed)
Addended by: [place], Mozell Haber on: 08/28/2010 12:15 PM   Modules accepted: Level of Service

## 2010-08-29 ENCOUNTER — Telehealth: Payer: Self-pay | Admitting: Family Medicine

## 2010-08-29 NOTE — Telephone Encounter (Signed)
Pt is having issues with toothache and need to know what she can do about it because she is pregnant and can't take a lot of meds.  Please call back to advise

## 2010-08-29 NOTE — Telephone Encounter (Signed)
Called her back and relayed Dr. Deirdre Peer message.  She tried the ice and and more Tylenol it does feel better.  She's going to wait and see what happens.  Told her if it's not any better by tomorrow am to call us early and we would work her in.

## 2010-08-29 NOTE — Telephone Encounter (Signed)
Began experiencing a toothache yesterday and by last night her gum and her jaw were hurting.  Took 2 Tylenol w/o relief.  Told her that she definitely needs to see a dentist to address the issue with the tooth but the only advice I could giver her at this point was to try an OTC numbing agent or ice pack to the side of that jaw.  I will route this to her PCP for any additional advice if would call her back.

## 2010-08-29 NOTE — Telephone Encounter (Signed)
She can take Tylenol for the toothache, If she is worried she can come in to be seen. If she has fever or sees any sign of infection she needs to come in to be seen. Note. Dr. Alvester Morin is her [facility] physician

## 2010-09-02 NOTE — Telephone Encounter (Signed)
Pt interested in [place] [facility] for prenatal care.  She has modified her work schedule to come to the Thursday pm appts.

## 2010-09-09 ENCOUNTER — Ambulatory Visit (INDEPENDENT_AMBULATORY_CARE_PROVIDER_SITE_OTHER): Payer: Medicaid Other | Admitting: Family Medicine

## 2010-09-09 VITALS — BP 112/72 | Wt 153.8 lb

## 2010-09-09 DIAGNOSIS — Z3480 Encounter for supervision of other normal pregnancy, unspecified trimester: Secondary | ICD-10-CM

## 2010-09-09 DIAGNOSIS — D649 Anemia, unspecified: Secondary | ICD-10-CM

## 2010-09-09 MED ORDER — FERROUS SULFATE 325 (65 FE) MG PO TABS: 325.0000 mg | ORAL_TABLET | Freq: Every day | ORAL | Status: DC

## 2010-09-09 NOTE — Patient Instructions (Signed)
Pregnancy - First Trimester During sexual intercourse, millions of sperm go into the vagina. Only 1 sperm will penetrate and fertilize the female egg while it is in the Fallopian tube. One week later, the fertilized egg implants into the wall of the uterus. An embryo begins to develop into a baby. At 6 to 8 weeks, the eyes and face are formed and the heartbeat can be seen on ultrasound. At the end of 12 weeks (first trimester), all the baby's organs are formed. Now that you are pregnant, you will want to do everything you can to have a healthy baby. Two of the most important things are to get good prenatal care and follow your caregiver's instructions. Prenatal care is all the medical care you receive before the baby's birth. It is given to prevent, find and treat problems during the pregnancy and childbirth. PRENATAL EXAMS:  During prenatal visits, your weight, blood pressure and urine are checked. This is done to make sure you are healthy and progressing normally during the pregnancy.   A pregnant woman should gain 25 to 35 pounds during the pregnancy. However, if you are over weight or underweight, your caregiver will advise you regarding your weight.   Your caregiver will ask and answer questions for you.   Blood work, cervical cultures, other necessary tests and a Pap test are done during your prenatal exams. These tests are done to check on your health and the probable health of your baby. Tests are strongly recommended and done for HIV with your permission. This is the virus that causes AIDS. These tests are done because medications can be given to help prevent your baby from being born with this infection should you have been infected without knowing it. Blood work is also used to find out your blood type, previous infections and follow your blood levels (hemoglobin).   Low hemoglobin (anemia) is common during pregnancy. Iron and vitamins are given to help prevent this. Later in the pregnancy,  blood tests for diabetes will be done along with any other tests if any problems develop. You may need tests to make sure you and the baby are doing well.   You may need other tests to make sure you and the baby are doing well.  CHANGES DURING THE FIRST TRIMESTER (THE FIRST 3 MONTHS OF PREGNANCY) Your body goes through many changes during pregnancy. They vary from person to person. Talk to your caregiver about changes you notice and are concerned about. Changes can include:  Your menstrual period stops.   The egg and sperm carry the genes that determine what you look like. Genes from you and your partner are forming a baby. The female genes determine whether the baby is a boy or a girl.   Your body increases in girth and you may feel bloated.   Feeling sick to your stomach (nauseous) and throwing up (vomiting). If the vomiting is uncontrollable, call your caregiver.   Your breasts will begin to enlarge and become tender.   Your nipples may stick out more and become darker.   The need to urinate more. Painful urination may mean you have a bladder infection.   Tiring easily.   Loss of appetite.   Cravings for certain kinds of food.   At first, you may gain or lose a couple of pounds.   You may have changes in your emotions from day to day (excited to be pregnant or concerned something may go wrong with the pregnancy and baby).  You may have more vivid and strange dreams.  HOME CARE INSTRUCTIONS  It is very important to avoid all smoking, alcohol and un-prescribed drugs during your pregnancy. These affect the formation and growth of the baby. Avoid chemicals while pregnant to ensure the delivery of a healthy infant.   Start your prenatal visits by the 12th week of pregnancy. They are usually scheduled monthly at first, then more often in the last 2 months before delivery. Keep your caregiver's appointments. Follow your caregiver's instructions regarding medication use, blood and lab  tests, exercise, and diet.   During pregnancy, you are providing food for you and your baby. Eat regular, well-balanced meals. Choose foods such as meat, fish, milk and other low fat dairy products, vegetables, fruits, and whole-grain breads and cereals. Your caregiver will tell you of the ideal weight gain.   You can help morning sickness by keeping soda crackers (saltines) at the bedside. Eat a couple before arising in the morning. You may want to use the crackers without salt on them.   Eating 4 to 5 small meals rather than 3 large meals a day also may help the nausea and vomiting.   Drinking liquids between meals instead of during meals also seems to help nausea and vomiting.   A physical sexual relationship may be continued throughout pregnancy if there are no other problems. Problems may be early (premature) leaking of amniotic fluid from the membranes, vaginal bleeding, or belly (abdominal) pain.   Exercise regularly if there are no restrictions. Check with your caregiver or physical therapist if you are unsure of the safety of some of your exercises. Greater weight gain will occur in the last 2 trimesters of pregnancy. Exercising will help:   Control your weight.   Keep you in shape.   Prepare you for labor and delivery.   Help you lose your pregnancy weight after you deliver your baby.   Wear a good support or jogging bra for breast tenderness during pregnancy. This may help if worn during sleep too.   Ask when prenatal classes are available. Begin classes when they are offered.   Do not use hot tubs, steam rooms or saunas.   Wear your seat belt when driving. This protects you and your baby if you are in an accident.   Avoid raw meat, uncooked cheese, cat litter boxes and soil used by cats throughout the pregnancy. These carry germs that can cause birth defects in the baby.   The first trimester is a good time to visit your dentist for your dental health. Getting your teeth  cleaned is OK. Use a softer toothbrush and brush gently during pregnancy.   Ask for help if you have financial, counseling or nutritional needs during pregnancy. Your caregiver will be able to offer counseling for these needs as well as refer you for other special needs.   Do not take any medications or herbs unless told by your caregiver.   Inform your caregiver if there is any mental or physical domestic violence.   Make a list of emergency phone numbers of family, friends, hospital, police and fire department.   Write down your questions. Take them to your prenatal visit.   Do not douche.   Do not cross your legs.   If you have to stand for long periods of time, rotate you feet or take small steps in a circle.   You may have more vaginal secretions that may require a sanitary pad. Do not use tampons   or scented sanitary pads.  MEDICATIONS AND DRUG USE IN PREGNANCY  Take prenatal vitamins as directed. The vitamin should contain 1 milligram of folic acid. Keep all vitamins out of reach of children. Only a couple vitamins or tablets containing iron may be fatal to a baby or young child when ingested.   Avoid use of all medications, including herbs, over-the-counter medications, not prescribed or suggested by your caregiver. Only take over-the-counter or prescription medicines for pain, discomfort, or fever as directed by your caregiver. Do not use aspirin, ibuprofen (Motrin, Advil, Nuprin) or naproxen (Aleve) unless OK'd by your caregiver.   Let your caregiver also know about herbs you may be using.   Alcohol is related to a number of birth defects. This includes fetal alcohol syndrome. All alcohol, in any form, should be avoided completely. Smoking will cause low birth rate and premature babies.   Street/illegal drugs are very harmful to the baby. They are absolutely forbidden. A baby born to an addicted mother will be addicted at birth. The baby will go through the same withdrawal  an adult does.   Let your caregiver know about any medications that you have to take and for what reason you take them.  MISCARRIAGE IS COMMON DURING PREGNANCY A miscarriage does not mean you did something wrong. It is not a reason to worry about getting pregnant again. Your caregiver will help you with questions you may have. If you have a miscarriage, you may need minor surgery (a D & C). SEEK MEDICAL CARE IF:  You have any concerns or worries during your pregnancy. It is better to call with your questions if you feel they cannot wait, rather than worry about them.  SEEK IMMEDIATE MEDICAL CARE IF:  An unexplained oral temperature above 100.5 develops, or as your caregiver suggests.   You have leaking of fluid from the vagina (birth canal). If leaking membranes are suspected, take your temperature and inform your caregiver of this when you call.   There is vaginal spotting or bleeding. Notify your caregiver of the amount and how many pads are used.   You develop a bad smelling vaginal discharge with a change in the color.   You continue to feel sick to your stomach (nauseated) and have no relief from remedies suggested. You vomit blood or coffee ground like materials.   You lose more than 2 pounds of weight in one week.   You gain more than 2 pounds of weight in a week and you notice swelling of your face, hands, feet or legs.   You gain 5 pounds or more in 1 week (even if you do not have swelling of your hands, face, legs or feet).   You get exposed to [place] measles and have never had them.   You are exposed to fifth disease or chicken pox.   You develop belly (abdominal) pain. Round ligament discomfort is a common non-cancerous (benign) cause of abdominal pain in pregnancy. Your caregiver still must evaluate this.   You develop headache, fever, diarrhea, pain with urination, or shortness of breath.   You fall, are in a car accident or have any kind of trauma.   There is mental  or physical violence in your home.  Document Released: 05/13/2001 Document Re-Released: 11/06/2009 [facility] Patient Information 2011 [place], [place].

## 2010-09-09 NOTE — Progress Notes (Signed)
S-  Tooth Pain- Went to UC last week 2/2 to significant tooth pain. Was dxd with tooth infection. Rxd amoxicillin. Tooth pain much improved s/p abx. No need for tylenol. No fevers. No longer smoking marijuana per pt No ETOH intake per pt Nausea somewhat improved w/ vit b6.  O- HEENT- No signs of infection, mild dental caries present CV-RRR PULM-CTAB See flowsheet for rest of PE A/P 29 YO G4P1021 here at 12.2 weeks for OB follow up visit Overall doing well  Early u/s concordant with LMP Encouraged MJ and ETOH cessation No signs of infection from HEENT/Tooth infection standpoint--> Will refer pt to medicaid dental list Will also start pt on iron for baseline anemia Will otherwise follow up in 2 weeks

## 2010-09-19 ENCOUNTER — Telehealth: Payer: Self-pay | Admitting: Family Medicine

## 2010-09-19 NOTE — Telephone Encounter (Signed)
Message copied by [place], Vinnie Bobst on Thu Sep 19, 2010 12:38 PM ------      Message from: Knox Royalty      Created: Thu Sep 19, 2010 10:40 AM      Regarding: Patient not coming      Contact: [phone]       Patient called to say she wasn't coming today, did not give a reason.

## 2010-09-19 NOTE — Telephone Encounter (Signed)
Spoke with pt.  She has to cover shifts at work today because her boss's daughter is having surgery.  She has an individual follow up appt next week, and will try to join the group after that.

## 2010-09-25 ENCOUNTER — Ambulatory Visit (INDEPENDENT_AMBULATORY_CARE_PROVIDER_SITE_OTHER): Payer: Medicaid Other | Admitting: Family Medicine

## 2010-09-25 DIAGNOSIS — Z331 Pregnant state, incidental: Secondary | ICD-10-CM

## 2010-09-25 NOTE — Patient Instructions (Addendum)
Next visit in 3 weeks  At that time we will do QUAD Screen and set up Anatomy Ultrasound  Try to take the iron at least once a day, take with water Continue to eat small snacks  If you have any vaginal bleeding or severe pain [facility] You can tylenol as needed  If you have any questions please call.

## 2010-09-27 NOTE — Progress Notes (Signed)
Pt here for routine OB care no concerns No further tooth pain Has not started iron supplements, occ Nausea, no emesis, occ HA, resolves with Tylenol if needed No fever, no recent illness Has stopped ETOH, and marijuana   GEN- NAD, alert and oriented HEENT- oropharynx clear, dental caries no abscess, MMM  A/P 29 YO G4P1021 here at 14.4 weeks for OB follow up visit  Overall doing well  Early u/s concordant with LMP  Discussed SW will see pt secondary to previous substance abuse  No signs of current tooth infection- awaiting dental visit- why may be deferred if pt does well Pt to start taking iron tablets at least once  A day Discussed genetic screening- pt to proceed with QUAD screen, missed first trimester screening RTC 3 week She will not be able to participate in the group prenatal care

## 2010-10-03 ENCOUNTER — Other Ambulatory Visit: Payer: Self-pay | Admitting: Family Medicine

## 2010-10-03 ENCOUNTER — Telehealth: Payer: Self-pay | Admitting: Family Medicine

## 2010-10-03 DIAGNOSIS — D649 Anemia, unspecified: Secondary | ICD-10-CM

## 2010-10-03 MED ORDER — FERROUS SULFATE 325 (65 FE) MG PO TABS: 325.0000 mg | ORAL_TABLET | Freq: Every day | ORAL | Status: DC

## 2010-10-03 NOTE — Telephone Encounter (Signed)
Done. Note Dr. Alvester Morin is pt physician for pregnancy

## 2010-10-03 NOTE — Telephone Encounter (Signed)
Please send Rx to [facility], thanks.Busick, Rodena Medin

## 2010-10-03 NOTE — Telephone Encounter (Signed)
Pt lost rx from last visit for iron, asking for it to be called into [facility]

## 2010-10-18 ENCOUNTER — Ambulatory Visit (INDEPENDENT_AMBULATORY_CARE_PROVIDER_SITE_OTHER): Payer: Medicaid Other | Admitting: Family Medicine

## 2010-10-18 DIAGNOSIS — Z331 Pregnant state, incidental: Secondary | ICD-10-CM

## 2010-10-18 DIAGNOSIS — K5900 Constipation, unspecified: Secondary | ICD-10-CM

## 2010-10-18 MED ORDER — POLYETHYLENE GLYCOL 3350 17 GM/SCOOP PO POWD: 17.0000 g | Freq: Every day | ORAL | Status: AC

## 2010-10-18 NOTE — Discharge Summary (Signed)
NAME:  Leah Baker, Leah Baker                         ACCOUNT NO.:  [id number]   MEDICAL RECORD NO.:  [id number]                   PATIENT TYPE:  INP   LOCATION:  9144                                 FACILITY:  [facility]   PHYSICIAN:  Billey Gosling, MD                   DATE OF BIRTH:  September 11, 1980   DATE OF ADMISSION:  01/15/2002  DATE OF DISCHARGE:  01/18/2002                                 DISCHARGE SUMMARY   ADMISSION DIAGNOSIS:  Intrauterine pregnancy at term with compound  presentation.   DISCHARGE DIAGNOSES:  1. Status post low transverse cesarean section secondary to compound fetal     presentation of a prolapsed hand.  2. Mild preeclampsia status post magnesium sulfate.   ATTENDING PHYSICIAN:  Reva Bores, MD.   RESIDENT:  Billey Gosling, [name]   PROCEDURE:  Low transverse cesarean section, see operative report.   HOSPITAL COURSE:  The patient is a 30 year old G2, P 0-0-1-0 who presented  to the [facility] at 39 weeks and six days with  complaints of leaking of fluid and contractions.  The patient had a gush of  fluid at 0630 on day of admission and painful contractions since 0600 that  day.  The patient was seen at the [facility] by Ocie Doyne,  [name] and history is significant for one spontaneous abortion.  On exam blood  pressure was 143/92.  On speculum exam there was positive pooling, positive  Nitrazine and postive ferning.  The general cervical exam revealed 1-2 cm  long cervix at negative 2 station and a prolapsed hand was felt.  Fetal  heart rate was 125-135 and reassuring.  Contractions were 2-4 minutes apart.  The patient was admitted for expectant management and subsequently had a low  transverse cesarean section secondary to footling breech presentation.  The  procedure went well without complications (see operative report).  The  patient delivered a viable girl with Apgars of 9 at one minute, 9 at five  minutes.  Following date of  admission, hemoglobin was 11.3.  On August 17th,  postop day #1, hemoglobin was 10.1.  Also on postop #0, the patient was  started on magnesium sulfate and after 24 hours was discontinued as the  patient was diuresing and blood pressures were remaining stable in the range  of 135-145/65-90.  On postop day #3, the patient was doing well, had no  complaints and had scant lochia, she was eating well and bottle feeding her  baby without difficulty.  The patient was then discharged and orders were  written to remove staples before being discharged and for the patient to  follow up with Dr. Remer Macho at the [facility] in six weeks  for a postpartum check.   DISCHARGE MEDICATIONS:  1. Percocet 5/325 mg on p.o. q.4 h. p.r.n. pain  #20 given.  2. Ortho Evra #1 pack  with three refills, start on Sunday January 30, 2002.   DISCHARGE DISPOSITION:  Stable.   DIET:  Regular.    ACTIVITY:  Pelvic rest.   FOLLOW UP:  Followup in six weeks at the [facility] with Dr.  Maple Hudson.                                               Billey Gosling, MD    AS/MEDQ  D:  01/18/2002  T:  01/18/2002  Job:  [id number]

## 2010-10-18 NOTE — Patient Instructions (Signed)
Next visit in 4 weeks  We will discuss your QUAD Screen at the next visit  Continue to eat small snacks  If you have any vaginal bleeding or severe pain [facility] to [facility] You can tylenol as needed  Increase your fluid intake and fiber for the constipation You can use the Miralax if you do not have a Bowel movement If you have any questions please call.

## 2010-10-18 NOTE — Op Note (Signed)
   NAME:  Leah Baker, Leah Baker                         ACCOUNT NO.:  [id number]   MEDICAL RECORD NO.:  [id number]                   PATIENT TYPE:  INP   LOCATION:  9179                                 [facility]ILITY:  [facility]   PHYSICIAN:  Reva Bores, MD                   DATE OF BIRTH:  [birth year]   DATE OF PROCEDURE:  01/15/2002  DATE OF DISCHARGE:                                 OPERATIVE REPORT   PREOPERATIVE DIAGNOSIS:  Probable hand presentation.   POSTOPERATIVE DIAGNOSIS:  Footling breech presentation.   OPERATION:  Low transverse cesarean.   SURGEON:  Conni Elliot, [name]   ANESTHESIA:  Spinal.   OPERATIVE FINDINGS:  Fetus with Apgars of 9 and 9.  Cord pH was sent.  Fluids clear.   PROCEDURE:  The patient was placed under spinal anesthetic.  The patient was  supine in the __________ position.  She was given oxygen and was prepped and  draped in sterile fashion, after which a Pfannenstiel incision was made.  This was made in the skin and fascia.  No cavity or bladder flap created.  A  uterine incision was made, pulling intrauterine cavity.  It was apparent  that this was a breech presentation and the incision was extended laterally  with bandage scissors.  The double foot breech presentation was delivered  without difficulty.  There was no entrapment of fetal head.  Cord was double  clamped and cut and baby handed to neonatologist in attendance.  Placenta  delivered spontaneously.  Uterus, bladder flap, anterior peritoneum, fascia  __________.  Estimated blood loss less than 800 cc.       Conni Elliot, [name]                   Reva Bores, MD    ASG/MEDQ  D:  01/15/2002  T:  01/15/2002  Job:  [id number] [id number]

## 2010-10-20 NOTE — Progress Notes (Signed)
S- constipation on and off, last week no BM for 3-4 days, had abd cramping, gas. Tried prune juice which helped some, but still feels constipated, asking for something to help, no N/V, no dysuria  GEN- NAD, alert and oriented ABD- uterus gravid, soft, NT, no CVA tenderness  A/P 29 YO G4P1021 here at 17.6 weeks for OB follow up visit  Early u/s concordant with LMP  Constipation typical of pregnancy- encouraged more fluids, fiber, prune juice okay, if unable to have BM for multiple days and having difficulty okay to use Miralax QUAD screen to be done, Ultrasound scheduled for anatomy F/U 4 weeks Note pt not taking iron supplement

## 2010-10-21 ENCOUNTER — Other Ambulatory Visit: Payer: Medicaid Other

## 2010-10-21 NOTE — Progress Notes (Signed)
QUAD screen drawn and sent to [facility] Nolon Bussing, MLS

## 2010-11-04 ENCOUNTER — Ambulatory Visit (HOSPITAL_COMMUNITY)
Admission: RE | Admit: 2010-11-04 | Discharge: 2010-11-04 | Disposition: A | Discharge status: Home / Self Care | Payer: Medicaid Other | Source: Ambulatory Visit | Attending: Family Medicine | Admitting: Family Medicine

## 2010-11-04 DIAGNOSIS — Z3689 Encounter for other specified antenatal screening: Secondary | ICD-10-CM | POA: Insufficient documentation

## 2010-11-14 ENCOUNTER — Ambulatory Visit (INDEPENDENT_AMBULATORY_CARE_PROVIDER_SITE_OTHER): Payer: Medicaid Other | Admitting: Family Medicine

## 2010-11-14 VITALS — BP 113/75 | Temp 98.7°F | Wt 168.0 lb

## 2010-11-14 DIAGNOSIS — F4321 Adjustment disorder with depressed mood: Secondary | ICD-10-CM

## 2010-11-14 DIAGNOSIS — Z331 Pregnant state, incidental: Secondary | ICD-10-CM

## 2010-11-14 DIAGNOSIS — Z3480 Encounter for supervision of other normal pregnancy, unspecified trimester: Secondary | ICD-10-CM

## 2010-11-14 NOTE — Patient Instructions (Signed)
Move to front of the kitchen at work If you notice you have any bleeding, loss of fluid, severe abdominal pain, go to [facility]. If you do not feel any movement in 2 hours then drink something sugary, if you still don't feel movement go to [facility] numbers as needed Follow-up in 4 weeks with Dr. Alvester Morin

## 2010-11-15 NOTE — Progress Notes (Signed)
Constipation improved, has been feeling very emotional these past few weeks. Pt began to recollect on her Mothers death this year in 2022/08/01 she found out she was pregnant in March, she spent a lot of time getting things together and consoling others but never let herself grief. Now that she is pregnant she finds herself crying at various times, she is upset a lot, and thinks about her mother. She feels this is out of character for her because she is the "strong one".  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  GEN- NAD, alert and oriented  Psych- crying when discussing her mother, good eye contact, does not appear depressed or anxious, but sad during approriate conversation, no apparent SI  A/P 29 YO G4P1021 here at 21.5 weeks for OB follow up visit  Early u/s concordant with LMP  Constipation improved- not on iron supplment secondary to constipation Grief reaction and emotional instability during pregnancy- given information about couseling, also recommended close visits with Dr. Alvester Morin QUAD neg, Anatomy screen-normal Female Infant RTC 4 weeks, sooner if needed to discuss mood, Boyfriend at visit today Move position at work- encouraged fluids to not get dehyrated, elevate feet at home

## 2010-11-18 ENCOUNTER — Inpatient Hospital Stay (HOSPITAL_COMMUNITY)
Admission: AD | Admit: 2010-11-18 | Discharge: 2010-11-18 | Disposition: A | Discharge status: Home / Self Care | Payer: Medicaid Other | Source: Ambulatory Visit | Attending: Family Medicine | Admitting: Family Medicine

## 2010-11-18 ENCOUNTER — Telehealth: Payer: Self-pay | Admitting: Family Medicine

## 2010-11-18 DIAGNOSIS — O212 Late vomiting of pregnancy: Secondary | ICD-10-CM | POA: Insufficient documentation

## 2010-11-18 DIAGNOSIS — K5289 Other specified noninfective gastroenteritis and colitis: Secondary | ICD-10-CM | POA: Insufficient documentation

## 2010-11-18 DIAGNOSIS — O98819 Other maternal infectious and parasitic diseases complicating pregnancy, unspecified trimester: Secondary | ICD-10-CM | POA: Insufficient documentation

## 2010-11-18 DIAGNOSIS — A5901 Trichomonal vulvovaginitis: Secondary | ICD-10-CM

## 2010-11-18 LAB — URINALYSIS, ROUTINE W REFLEX MICROSCOPIC
Nitrite: NEGATIVE
Specific Gravity, Urine: 1.02 (ref 1.005–1.030)
Urobilinogen, UA: 0.2 mg/dL (ref 0.0–1.0)

## 2010-11-18 LAB — URINE MICROSCOPIC-ADD ON

## 2010-11-18 MED ORDER — ONDANSETRON 8 MG PO TBDP: 8.0000 mg | ORAL_TABLET | Freq: Three times a day (TID) | ORAL | Status: AC | PRN

## 2010-11-18 NOTE — Telephone Encounter (Signed)
Patient notified of message from Dr. [place]. States baby is moving as usual. She voices understanding that she is to go to [facility] if she continues to have vomiting and is not able to hold down fluids.

## 2010-11-18 NOTE — Telephone Encounter (Signed)
Needs to talk nurse about nausea/vomiting/diarrhea - ob- 25 weeks

## 2010-11-18 NOTE — Telephone Encounter (Signed)
Discussed with Myrlene Broker, RN. Pt reports vomiting and diarrhea yesterday, last episode of vomiting this morning at 4:30.  Denies fever.  Able to tolerate sips of gatorade and ice chips.  If she has normal fetal movement, no vaginal bleeding, no LOF and no contractions and no abdominal pain, then it is likely she has gastroenteritis that is resolving.  Will call in ondansetron for the perisitent nausea.  Pt to follow up with [facility] if any concerning sx.

## 2010-11-27 ENCOUNTER — Telehealth: Payer: Self-pay | Admitting: Family Medicine

## 2010-11-27 NOTE — Telephone Encounter (Signed)
Req rx for prenatal vitamins to be sent to  [facility]/[address]

## 2010-11-27 NOTE — Telephone Encounter (Signed)
Forward to [place] for Rx request. 

## 2010-11-28 MED ORDER — PRENATAL PLUS 27-1 MG PO TABS: 1.0000 | ORAL_TABLET | Freq: Every day | ORAL | Status: DC

## 2010-11-28 NOTE — Telephone Encounter (Signed)
Medication refilled

## 2010-12-11 ENCOUNTER — Encounter: Payer: Self-pay | Admitting: Family Medicine

## 2010-12-16 ENCOUNTER — Ambulatory Visit (INDEPENDENT_AMBULATORY_CARE_PROVIDER_SITE_OTHER): Payer: Medicaid Other | Admitting: Family Medicine

## 2010-12-16 VITALS — BP 110/62 | Wt 168.5 lb

## 2010-12-16 DIAGNOSIS — N760 Acute vaginitis: Secondary | ICD-10-CM

## 2010-12-16 DIAGNOSIS — A499 Bacterial infection, unspecified: Secondary | ICD-10-CM

## 2010-12-16 DIAGNOSIS — N898 Other specified noninflammatory disorders of vagina: Secondary | ICD-10-CM

## 2010-12-16 DIAGNOSIS — K089 Disorder of teeth and supporting structures, unspecified: Secondary | ICD-10-CM

## 2010-12-16 DIAGNOSIS — O26899 Other specified pregnancy related conditions, unspecified trimester: Secondary | ICD-10-CM

## 2010-12-16 DIAGNOSIS — K0889 Other specified disorders of teeth and supporting structures: Secondary | ICD-10-CM

## 2010-12-16 LAB — POCT WET PREP (WET MOUNT): Trichomonas Wet Prep HPF POC: NEGATIVE

## 2010-12-16 MED ORDER — HYDROCODONE-ACETAMINOPHEN 7.5-500 MG PO TABS: 1.0000 | ORAL_TABLET | ORAL | Status: AC | PRN

## 2010-12-16 MED ORDER — METRONIDAZOLE 500 MG PO TABS: 500.0000 mg | ORAL_TABLET | Freq: Two times a day (BID) | ORAL | Status: AC

## 2010-12-16 NOTE — Patient Instructions (Signed)
It was good to see you again  Your pregnancy is overall doing well We are checking cervical cultures because of your vaginal discharge  Be sure to have your partner tested at the health department to make sure that any STDs do not reoccur.  I am prescribing you some medication for your dental pain.  If you have any severe abdominal pain, vaginal bleeding or decreased fetal movement, please give [place] a call or go to [facility].  Come back in 2 weeks to go to [facility] clinic  Call with any questions,  God Bless,  Doree Albee MD

## 2010-12-16 NOTE — Progress Notes (Signed)
Pt here for routine OB visit:  Pt recently seen at [facility] on 6/18 for gastroenteritis. This has resolved per pt. Pt does report dx/o trichmoniasis at [facility]. Pt reports having Trichimoniasis in the past. Has been monogamous with partner. Partner has never treated for STDs in the past. Pt does report vaginal discharge and mild irritation over last 2-3 weeks. Vaginal discharge mildly foul smelling. Tooth Pain: Pt reports dental pain since beginning of pregnancy. Has been seen by Dentistry previously for R tooth pain. Now with L tooth pain. Pt states that dentistry referred to R tooth being needed to be removed at prior visit. Per pt, dentistry stated that it would likely be able to be removed after pregnancy. Now with recurrence of tooth pain over last 2-3 weeks. Has been using up to 4 g tylenol daily with moderate relief in pain. No fever, no drainage, no swelling per pt.  Mood has been overall stable. Pt states that she was able to look at mother's funeral DVD without significant crying. Feels that she has able to process her emotions, and is more able to deal with mother's passing. No HI/SI. Has not gone to grief counseling, though resources were offered.  Constipation has resolved. Pt is trying to eat high fiber foods. This has helped with BMs. Is ok if she can have 1 BM daily.  Is taking multivitamin daily.  Work has been stable. No significant stressors noted.  Boyfriend Michele Mcalpine has been very supportive. No ETOH, tobacco, marijuana use per pt.   Has not been to [facility] clinic to date per pt.  O:  Gen: up in bed, NAD HEENT: + Tooth decay in L lower molars.  CV: RRR, no murmurs PULM: CTAB, no wheezes ABD: S/NT/gravid to 27 cm EXT: 2+ peripheral pulses, no edema A/P: 30 YO G4P1021 here at 26 2/7 weeks here for routine OB visit.  Will obtain wet prep GC/Chl for vaginal discharge. Stressed importance of partner being tested for STDs. Pt/partner agreeable.  + Clue cells on wet prep--> Will tx w/ flagyl Will place  pt on course of vicodin for dental pain, will also re-refer to dentistry  Constipation has resolved, encouraged high fiber intake Encouraged prenatal vitamin use.  Discussed PTL red flags.  Will refer to [facility] clinic for visit in 2 weeks Will likely need HIV, RPR, CBC, and 1 hour gtt at that time.

## 2010-12-26 ENCOUNTER — Ambulatory Visit: Payer: Medicaid Other | Admitting: Family Medicine

## 2010-12-26 LAB — RPR

## 2010-12-26 LAB — CBC
MCHC: 31.5 g/dL (ref 30.0–36.0)
Platelets: 352 10*3/uL (ref 150–400)
RDW: 14.5 % (ref 11.5–15.5)

## 2010-12-26 LAB — HIV ANTIBODY (ROUTINE TESTING W REFLEX): HIV: NONREACTIVE

## 2010-12-26 NOTE — Patient Instructions (Signed)
It was good to see you again.  Your pregnancy is going overall well Complete your course of metronidazole Be sure to set up an appointment in OB clinic in the next 2 weeks,  Come back to see me 2 weeks after that appointment If you develop any severe abdominal pain, vaginal bleeding, or concerning decreased fetal movement, please call us or go to [facility] Otherwise call with any other questions. God Bless,  Doree Albee MD

## 2010-12-30 ENCOUNTER — Telehealth: Payer: Self-pay | Admitting: *Deleted

## 2010-12-30 ENCOUNTER — Telehealth: Payer: Self-pay | Admitting: Family Medicine

## 2010-12-30 NOTE — Progress Notes (Signed)
Pt here for routine prenatal visit: Pt reports dental pain that has been present over last 1-2 weeks. Has had previous dental work in the past. No swelling, or drainage. But has noticed intermittent pain in L post molar; unrelieved with tylenol.  Otherwise no acute issues per pt. Tolerating prenatal vitamin. 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 No ETOH, marijuana, tobacco use. Boyfriend has been very supportive since onset of pregnancy.  Vaginal D/C from previous visit has resolved s/p flagyl.  O: Gen: up in bed, NAD  HEENT: + Tooth decay in L lower molars.  CV: RRR, no murmurs  PULM: CTAB, no wheezes  ABD: S/NT/gravid to 27 cm  EXT: 2+ peripheral pulses, no edema A/P: 30 YO G4P1021 here at 27 5/7 weeks here for routine prenatal visit.  HIV, RPR, CBC and 1 hour gtt today.  Will re-refer to dentistry for tooth pain.  Pt also instructed to set up appt with OB clinic.  PTL red flags discussed.

## 2010-12-30 NOTE — Telephone Encounter (Signed)
Was given HYDROcodone-acetaminophen (LORTAB) 7.5-500 MG per tablet For tooth pain and she can't get into see the dentist until next Monday - wants to know if she can get enough until then.

## 2010-12-30 NOTE — Telephone Encounter (Signed)
Called pt. Pt reports, that Dr.Newton has given her Lortab before. I told the pt, that I will send her request to Dr.Newton. Leah Baker, Leah Baker

## 2010-12-30 NOTE — Telephone Encounter (Signed)
Paged Dr.Newton in re: rx Lortab. He will write Rx tomorrow and pt can pick it up.  Called pt. She can pick up the RX tomorrow. Pt agreed. Lorenda Hatchet, Renato Battles

## 2010-12-31 ENCOUNTER — Telehealth: Payer: Self-pay | Admitting: Family Medicine

## 2010-12-31 ENCOUNTER — Inpatient Hospital Stay (HOSPITAL_COMMUNITY)
Admission: AD | Admit: 2010-12-31 | Discharge: 2010-12-31 | Disposition: A | Discharge status: Home / Self Care | Payer: Medicaid Other | Source: Ambulatory Visit | Attending: Obstetrics and Gynecology | Admitting: Obstetrics and Gynecology

## 2010-12-31 ENCOUNTER — Encounter (HOSPITAL_COMMUNITY): Payer: Self-pay

## 2010-12-31 DIAGNOSIS — K089 Disorder of teeth and supporting structures, unspecified: Secondary | ICD-10-CM | POA: Insufficient documentation

## 2010-12-31 DIAGNOSIS — O9989 Other specified diseases and conditions complicating pregnancy, childbirth and the puerperium: Secondary | ICD-10-CM | POA: Insufficient documentation

## 2010-12-31 DIAGNOSIS — K0889 Other specified disorders of teeth and supporting structures: Secondary | ICD-10-CM

## 2010-12-31 DIAGNOSIS — Z87891 Personal history of nicotine dependence: Secondary | ICD-10-CM | POA: Insufficient documentation

## 2010-12-31 DIAGNOSIS — K029 Dental caries, unspecified: Secondary | ICD-10-CM | POA: Insufficient documentation

## 2010-12-31 MED ORDER — AMOXICILLIN 500 MG PO CAPS: 500.0000 mg | ORAL_CAPSULE | Freq: Three times a day (TID) | ORAL | Status: AC

## 2010-12-31 MED ORDER — ACETAMINOPHEN-CODEINE 300-30 MG PO TABS: 1.0000 | ORAL_TABLET | ORAL | Status: AC | PRN

## 2010-12-31 NOTE — Telephone Encounter (Signed)
Tooth pain worsened overnight such that no pain medication will work.  Has dental appointment Monday (6 days).  Feeling the baby move. No fevers or chills.

## 2010-12-31 NOTE — ED Provider Notes (Signed)
History     Chief Complaint  Patient presents with  . Dental Pain   HPI  Patient presents with tooth pain x 3 wks; previously seen by Dr. Alvester Morin and referred to dentist.  Pt has scheduled  Appointment with dentist on 01/06/11.  Denies fever, body aches or chills.  +radiation to left ear.  No report of contraction, Leaking of fluid, or vaginal bleeding.    Past Medical History  Diagnosis Date  . Anemia     Past Surgical History  Procedure Date  . Cesarean section   . Iud removal 07/2009    IUD strings were attached to uterine wall, had to be operatively removed.     History reviewed. No pertinent family history.  History  Substance Use Topics  . Smoking status: Former Smoker    Quit date: 08/22/2008  . Smokeless tobacco: Never Used  . Alcohol Use: No    Allergies: No Known Allergies  Prescriptions prior to admission  Medication Sig Dispense Refill  . acetaminophen (TYLENOL) 500 MG tablet Take 1,000 mg by mouth every 6 (six) hours as needed. pain       . bisacodyl (BISACODYL) 5 MG EC tablet Take 5 mg by mouth 2 (two) times daily as needed. bm       . prenatal vitamin w/FE, FA (PRENATAL 1 + 1) 27-1 MG TABS Take 1 tablet by mouth at bedtime.        . ferrous sulfate 325 (65 FE) MG tablet Take 1 tablet (325 mg total) by mouth daily.  30 tablet  6  . PRENATAL MV-MIN-FE FUM-FA-DHA PO Take 1 tablet by mouth.        . prenatal vitamin w/FE, FA (PRENATAL 1 + 1) 27-1 MG TABS Take 1 tablet by mouth daily.  30 each  11  . pyridoxine (B-6) 100 MG tablet Take 1 tablet (100 mg total) by mouth daily.  90 tablet  3    ROS Pertinent ROS included in the HPI.  Physical Exam   Blood pressure 110/75, pulse 91, temperature 98.1 F (36.7 C), temperature source Oral, resp. rate 18, height 5\' 3"  (1.6 m), weight 78.019 kg (172 lb), last menstrual period 06/15/2010.  Physical Exam  Constitutional: She is oriented to person, place, and time. She appears well-developed and well-nourished.    HENT:  Head: Normocephalic.  Mouth/Throat: Dental caries present.    Neck: Normal range of motion. Neck supple.  Cardiovascular: Normal rate, regular rhythm and normal heart sounds.   Respiratory: Effort normal and breath sounds normal.  Genitourinary: No bleeding around the vagina. Vaginal discharge: mucusy.  Neurological: She is alert and oriented to person, place, and time. She has normal reflexes.  Skin: Skin is warm and dry.    MAU Course  Procedures  Assessment and Plan  Dental Pain  Plan: Keep scheduled dental appointment RX Tylenol#3 and Amoxicillin  [facility] 12/31/2010, 7:53 AM

## 2010-12-31 NOTE — Telephone Encounter (Signed)
<<  Append from prior note>> Plan: Either wait for clinic to open or go to the ED. Will likely need antibiotics.

## 2010-12-31 NOTE — Progress Notes (Signed)
Pt reports she has an infected tooth on left lower jaw, has a dental appoint on Monday but pain is unbearable. Denies fever. Pt is 28 weeks preg and a pt at [facility]

## 2010-12-31 NOTE — Progress Notes (Signed)
Pt states bottom left tooth infected. Woke up this morning states tooth "inflamed" and painful. Has dentist appt Monday. Took tylenol with no relief.

## 2011-01-01 NOTE — ED Provider Notes (Signed)
Agree with above note.  Leah Baker 01/01/2011 2:11 PM

## 2011-01-02 ENCOUNTER — Ambulatory Visit (INDEPENDENT_AMBULATORY_CARE_PROVIDER_SITE_OTHER): Payer: Medicaid Other | Admitting: Family Medicine

## 2011-01-02 VITALS — BP 104/60 | Wt 170.3 lb

## 2011-01-02 DIAGNOSIS — K047 Periapical abscess without sinus: Secondary | ICD-10-CM | POA: Insufficient documentation

## 2011-01-02 DIAGNOSIS — Z3480 Encounter for supervision of other normal pregnancy, unspecified trimester: Secondary | ICD-10-CM

## 2011-01-02 MED ORDER — HYDROCODONE-ACETAMINOPHEN 5-325 MG PO TABS: 1.0000 | ORAL_TABLET | ORAL | Status: DC | PRN

## 2011-01-02 NOTE — Patient Instructions (Addendum)
It was a pleasure to see you today.  As we discussed, it is important to see the dentist on Monday for definitive treatment of your tooth issue.   Please use the hydrocodone as needed; keep up with the cold compresses and tylenol as you have been doing.   You may take iron (ferrous sulfate 325mg ) one time daily to help boost your blood count.  You may take fiber supplements (psyllium, or Metamucil or MiraLax) to combat constipation that is worsened with the iron.   Please make a follow up appointment with Dr Alvester Morin in 2 weeks (keep Aug 21 appointment).

## 2011-01-02 NOTE — Progress Notes (Signed)
Patient seen in [facility].  She is [redacted]w[redacted]d by LMP c/w 1st trimester [place].  Daily fetal movement, no ctx, no vaginal bleeding or discharge.   Her biggest concern is L lower molar pain from tooth abscess; went to [facility] on 7/31 and given amoxil, also T#3.  The T3 does not help much, she prefers the hydrocodone that she received from Dr Alvester Morin in July for pain control.  Had seen a dentist who was uncomfortable with idea of tooth extraction in pregnant patient.  Has appointment with dentist on August 6th and is hoping to have extraction done then.  On exam, poor dentition with broken L lower and R lower molars, no gingival erythema.  Plan to continue with pregnancy care routine.   Pregnant patients may have dental extractions during pregnancy, a letter is provided to her for her to give to dentist to address this issue.  Refill on hydrocodone for limited use; she understands concern for prolonged use during pregnancy.  Follow up in 2 weeks.

## 2011-01-06 ENCOUNTER — Telehealth: Payer: Self-pay | Admitting: Family Medicine

## 2011-01-06 NOTE — Telephone Encounter (Signed)
Dentist office needs a letter with permission for X-ray and procedure due to a toothache due to her pregnancy.  Fax # [phone].

## 2011-01-06 NOTE — Telephone Encounter (Signed)
Patient is calling back.  She has an appt with the Dentist today at 4:00.  She wants to reschedule her appt with the Dentist because of being sick on her stomach.  She wants to get the ok from Dr. Edmonia James before she reschedules the Dental appt.  The Dentist office is still going to need a letter of approval since she is pregnant.  Would like a phone call please and is willing to come by and pick up the letter.

## 2011-01-06 NOTE — Telephone Encounter (Signed)
OB patient that had an appt with dentist today was called by them to say dentist had a death in family and can't see her until next week.  She is very upset and has been waiting for 1.5 weeks to be seen and now has to wait again.  She has been up all night with tooth pain and doesn't think she can wait another week.  Wants to know what we can do to help her been seen sooner somewhere else. Burr Medico is the dentist that rescheduled her.

## 2011-01-06 NOTE — Telephone Encounter (Signed)
Forward to Dr Edmonia James for letter

## 2011-01-08 ENCOUNTER — Other Ambulatory Visit: Payer: Self-pay | Admitting: Family Medicine

## 2011-01-08 ENCOUNTER — Encounter: Payer: Self-pay | Admitting: Family Medicine

## 2011-01-08 DIAGNOSIS — K047 Periapical abscess without sinus: Secondary | ICD-10-CM

## 2011-01-08 MED ORDER — HYDROCODONE-ACETAMINOPHEN 5-325 MG PO TABS: 1.0000 | ORAL_TABLET | ORAL | Status: AC | PRN

## 2011-01-08 NOTE — Telephone Encounter (Signed)
Will send to Dr.Newton (Dr.Caviness is out) .Leah Baker

## 2011-01-08 NOTE — Telephone Encounter (Signed)
Called pt back and reviewed case with Dr. Mauricio Po. OK letter for xrays and anesthesia has been given. Short course of additonal norco also written for.

## 2011-01-08 NOTE — Telephone Encounter (Signed)
Pt calling back, needs to know something asap, she is in severe pain.

## 2011-01-21 ENCOUNTER — Ambulatory Visit: Payer: Medicaid Other | Admitting: Family Medicine

## 2011-01-21 NOTE — Patient Instructions (Addendum)
It was good to see you today Be sure to follow up with the dentist today to schedule your tooth extraction Try to avoid drinking caffeinated beverages and try drinking more water to help with your round ligament pain.  You have  an appt at [facility] for c-section counseling on 02/11/11 at 10am.  Come back to see me in 2 weeks for a follow up visit If you have any severe abdominal pain, vaginal bleeding, or decreased fetal movement well below normal (< 5 movements per hour), please call the [facility] or go to [facility], If you have any questions, please call the [facility] God Bless, Doree Albee MD

## 2011-01-21 NOTE — Progress Notes (Signed)
Pt here for routine prenatal visit.  Pt was scheduled for tooth extraction, however, pt had to be rescheduled. Pt is pending follow up appt time with dentistry.  Tooth pain has been intermittent in nature. There are some days that tooth is not painful, there are other days that tooth is not very bothersome. No fevers, mouth swelling, tooth drainage per pt.  Pt has also had some back and lower abdominal pain that has been present over the last 2 weeks. Pt states that back pain was previously minimal and relieved with rest. Pt now noted lower back and lower abdominal that is present throughout the day. Back pain is improved with rest and at night. Lower abdominal pain also improved with rest. Pt also been working on feet at [employer] up to 40 hours per week. Pt states that she has been drinking water daily. Pt states that she has also been drinking a lot of caffeinated drinks including mountain dew and sweet tea.  O  Gen: up in chair, NAD HEENT: NCAT, EOMI, TMs clear bilaterally, poor dentition CV: RRR, no murmurs auscultated PULM: CTAB, no wheezes, rales, rhoncii ABD: S/+ bowel sounds/gravid abdomen/mild tenderness to deep palpation in lower abdomen bilaterally  EXT: 2+ peripheral pulses, no edema A/P: 30 YO G4P1021 here at 31 3/7 weeks for routine prenatal visit. Adbominal pain consistent with round ligament pain. Encouraged liberal po water intake as pt is still fairly active. Will follow up at next prenatal appt.  Pt pending appt with dentist for tooth extraction. Discussed with pt that if any other documentation for medical clearance are needed or if the dentist had any other particular concerns, to call the [facility]. Pt agreeable Will set up pt for c/s counseling at [facility] in setting of hx/o c/s with prior pregnancy Pt is considering BTL for contraception, will follow up on this at next prenatal visit.  Pt plans on pumping.  Discussed PTL red flags.  Will follow up in 2 weeks.

## 2011-01-28 ENCOUNTER — Telehealth: Payer: Self-pay | Admitting: Family Medicine

## 2011-01-28 NOTE — Telephone Encounter (Signed)
Has a dental appt on 9/5 and would like something else for the pain because the Tylenol is not doing the trick.  Please give her a call.

## 2011-01-28 NOTE — Telephone Encounter (Signed)
Patient requesting something for pain, tylenol is not helping.

## 2011-01-30 NOTE — Telephone Encounter (Signed)
Pt needs to come in for evaluation if pain is increasing.  Needs to be evaluated to see if she needs antibiotic or just pain meds to get her through to her appointment.  Will have staff call and schedule pt for a work in appointment.

## 2011-01-30 NOTE — Telephone Encounter (Signed)
Called patient and advised her to schedule appointment to be worked in tomorrow, patient agreed.Busick, Rodena Medin

## 2011-01-31 ENCOUNTER — Ambulatory Visit (INDEPENDENT_AMBULATORY_CARE_PROVIDER_SITE_OTHER): Payer: Medicaid Other | Admitting: Family Medicine

## 2011-01-31 VITALS — BP 110/70 | Wt 180.9 lb

## 2011-01-31 DIAGNOSIS — K047 Periapical abscess without sinus: Secondary | ICD-10-CM

## 2011-01-31 MED ORDER — OXYCODONE HCL 10 MG PO TABS: 1.0000 | ORAL_TABLET | Freq: Two times a day (BID) | ORAL | Status: DC | PRN

## 2011-01-31 MED ORDER — AMOXICILLIN-POT CLAVULANATE 875-125 MG PO TABS: 1.0000 | ORAL_TABLET | Freq: Two times a day (BID) | ORAL | Status: AC

## 2011-01-31 NOTE — Progress Notes (Signed)
Worsening left-sided tooth pain. Pain was 5/10 now 7/10. Difficulty eating and even drinking, although able to drink water. Ice helps. Tylenol not helping. Scheduled for surgery for tooth removal next week 09/05. History of marijuana and alcohol abuse noted.  PE:    Gen: NAD   Mouth: discrete area of erythema in left lower gum and TTP of lower molars and upper molars on that same side; no pus visualized   Abd: gravid  A/P: Precepted with Dr. Mauricio Po 1. Continue to take Tylenol for pain. Will give short-course of oxycodone for breakthrough pain to take at least through next week (day of her oral surgery). Hopefully controlling pain with help her eat regularly and stay hydrated. Baby's heart rate was on the lower side today, probably due to dehydration and decreased PO. Re-discussed kick counts and worrisome symptoms that should prompt return to the clinic or [facility] 2. Follow-up at regular [facility] next week.

## 2011-01-31 NOTE — Patient Instructions (Signed)
Continue to take the Tylenol. You may also take oxycodone as needed. This may cause your baby to become sleepy. If your baby has decreased movement, hold off on taking this medication and see how your baby does the rest of the day. If your baby still is not doing well, come into the clinic or go to [facility].   Take the antibiotic for your tooth infection.   Follow-up with [place] as needed or at your regular prenatal visit.   Drink plenty of fluids (8-10 glasses) and eat regular meals/snacks.

## 2011-02-05 ENCOUNTER — Ambulatory Visit: Payer: Medicaid Other | Admitting: Family Medicine

## 2011-02-05 NOTE — Progress Notes (Signed)
Pt has had some abd pain, cramping consistent with round ligament pain. 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 Has tried rest and fluids. This has helped. Still drinking caffeinated sodas.  Has appt with dentist today. Pain has been controlled since last clinical visit with Dr. Madolyn Frieze. Pt is hopeful that tooth extraction will occur today. Otherwise no acute issues.  PE Unchanged  PTL red flags discussed Pt is planning for pregnancy classes at [facility] Discussed adequate hydration and decreased work activity.  Plan for follow up visit in 2 weeks.  Will obtain GC/Chl/GBS at this time PTL red flags reviewed.

## 2011-02-05 NOTE — Patient Instructions (Addendum)
It was good to see you today  Dry drinking more water and staying off of your feet more If you ave any vaginal bleeding, or decreased fetal movement, please give [place] a call Best wishes with your dental appointment today  Come back to see me in 2-3 weeks Call with any questions,  God Bless, Doree Albee MD

## 2011-02-11 ENCOUNTER — Encounter: Payer: Self-pay | Admitting: Obstetrics and Gynecology

## 2011-02-11 DIAGNOSIS — O34219 Maternal care for unspecified type scar from previous cesarean delivery: Secondary | ICD-10-CM

## 2011-02-11 NOTE — Progress Notes (Signed)
30 y.o. at [redacted]w[redacted]d with Estimated Date of Delivery: 03/22/11 was seen today in office to discuss VBAC versus repeat cesarean section.  The patient had a previous primary cesarean section secondary to breech presentation. The following risks were discussed with the patient.  Risk of uterine rupture at term is 0.78 percent with TOLAC and 0.22 percent with elective repeat cesarean section 1 in 10 uterine ruptures will result in neonatal death or neurological injury. The benefits of a trial of labor after cesarean resulting in a vaginal birth after cesarean include the following: shorter length of hospital stay and postpartum recovery (in most cases); fewer complications, such as postpartum fever, wound or uterine infection, thromboembolism (blood clots in the leg or lung), need for blood transfusion and fewer neonatal breathing problems. The risks of an attempted VBAC or TOLAC include the following: Risk of failed trial of labor after cesarean without a vaginal birth after cesarean resulting in repeat cesarean delivery in about 20 to 40 percent of women who attempt VBAC.  Risk of rupture of uterus resulting in an emergency cesarean delivery. The risk of uterine rupture may be related in part to the type of uterine incision made during the first cesarean delivery. A previous transverse uterine incision has the lowest risk of rupture (0.2 to 1.5 percent risk). Vertical or T-shaped uterine incisions have a higher risk of uterine rupture (4 to 9 percent risk)The risk of fetal death is very low with both VBAC and elective repeat cesarean delivery, but the likelihood of fetal death is higher with VBAC than with elective repeat cesarean section. Maternal death is very rare with either type of delivery.  The risks of a repeat cesarean section were reviewed with the patient including but not limited to: 07/998 risk of uterine rupture which could have serious consequences, bleeding which may require transfusion; infection  which may require antibiotics; injury to bowel, bladder or other surrounding organs (bowel, bladder, ureters); injury to the fetus; need for additional procedures including hysterectomy in the event of a life-threatening hemorrhage; thromboembolic phenomenon, incisional problems and other postoperative/anesthesia complications.    All her questions were answered and the patient verbalized understanding. She signed a consent indicating a preference for trial of labor after cesarean section (TOLAC).  Patient also expressed concern that the fetus was again in breech presentation. A bedside ultrasound demonstrated the fetus to be in vertex position. Preterm labor/fetal movement precautions were discussed.

## 2011-02-12 ENCOUNTER — Telehealth: Payer: Self-pay | Admitting: Family Medicine

## 2011-02-12 NOTE — Telephone Encounter (Signed)
I recommend that pt come in tomorrow for recheck.

## 2011-02-12 NOTE — Telephone Encounter (Signed)
Appointment scheduled for tomorrow.

## 2011-02-12 NOTE — Telephone Encounter (Signed)
Pt is still in a lot of pain with her tooth and oral surgeon cannot do anything until after she delivers.  She is asking some pain meds and/or abx to get her thru delivery.   [facility] [address]

## 2011-02-12 NOTE — Telephone Encounter (Signed)
Dr. Alvester Morin is unavailable today, consulted with Dr. Earnest Bailey. She advises to discuss with Dr. Alvester Morin tomorrow,  Dr. Alvester Morin may want to speak with oral surgeon. Dr. Vela Prose  Phone # [phone]   patient states Dr. Christianne Borrow concern is the anesethia required for the extraction. Patient states the oxycodone was too strong , made her feel bad . Would like another pain medication and she thinks she needs another antibiotic .    will send message to Dr. Edmonia James.

## 2011-02-12 NOTE — Telephone Encounter (Signed)
Spoke with patient . Advised her I will try to contact Dr. Alvester Morin.

## 2011-02-13 ENCOUNTER — Encounter: Payer: Self-pay | Admitting: Family Medicine

## 2011-02-13 ENCOUNTER — Ambulatory Visit (INDEPENDENT_AMBULATORY_CARE_PROVIDER_SITE_OTHER): Payer: Medicaid Other | Admitting: Family Medicine

## 2011-02-13 VITALS — BP 115/79 | HR 105 | Temp 98.7°F | Ht 63.0 in | Wt 179.0 lb

## 2011-02-13 DIAGNOSIS — K047 Periapical abscess without sinus: Secondary | ICD-10-CM

## 2011-02-13 DIAGNOSIS — B3731 Acute candidiasis of vulva and vagina: Secondary | ICD-10-CM | POA: Insufficient documentation

## 2011-02-13 DIAGNOSIS — B373 Candidiasis of vulva and vagina: Secondary | ICD-10-CM

## 2011-02-13 MED ORDER — PENICILLIN V POTASSIUM 500 MG PO TABS: 500.0000 mg | ORAL_TABLET | Freq: Three times a day (TID) | ORAL | Status: AC

## 2011-02-13 MED ORDER — MICONAZOLE NITRATE 2 % VA CREA: 1.0000 | TOPICAL_CREAM | Freq: Every day | VAGINAL | Status: AC

## 2011-02-13 MED ORDER — HYDROCODONE-ACETAMINOPHEN 5-500 MG PO TABS: 1.0000 | ORAL_TABLET | Freq: Four times a day (QID) | ORAL | Status: AC | PRN

## 2011-02-13 NOTE — Assessment & Plan Note (Signed)
Very poor dentition. Patient has done her job of attempting to get this tooth taken care of during this pregnancy. However she will be unable to have extraction until after she delivers. Plan to provide analgesia with oral Vicodin 60 pills with one refill and antibiotics consisting of Penicillin VK 3 times a day. Patient will followup with Dr. Alvester Morin

## 2011-02-13 NOTE — Assessment & Plan Note (Signed)
Based on wet prep results and physical exam. Will treat initially with miconazole cream. Patient will followup with Dr. Alvester Morin in one week for her regularly scheduled prenatal visit. At that point if she is still symptomatic would recommend switching to oral fluconazole.

## 2011-02-13 NOTE — Progress Notes (Signed)
Ms. Ulatowski presents to clinic today for tooth pain in the yeast infection.  Tooth pain: Followup of dental abscess previously diagnosed. In the interim she has seen dentists and an oral surgeon she will have to have oral surgery following her delivery. She has run out of her oral antibiotic that she was taking. Additionally she was taking oxycodone 10 mg however this made her sick to her stomach and excessively sleepy. She requests switching to a different pain. She denies any fevers or chills.  Yeast infection: Patient noted external vaginal irritation and itching starting this more. She thinks this is consistent with her prior yeast infection. She has not tried any medications at this time. She denies any dysuria and does note her normal clear vaginal discharge that she's had with this pregnancy. She feels the baby move and denies any contraction or vaginal bleeding.  PMH reviewed.  ROS as above otherwise neg Medications reviewed.  Exam:  BP 115/79  Pulse 105  Temp(Src) 98.7 F (37.1 C) (Oral)  Ht 5\' 3"  (1.6 m)  Wt 179 lb (81.194 kg)  BMI 31.71 kg/m2  LMP 06/15/2010 Gen: Well NAD HEENT: EOMI,  MMM, very poor dentition with multiple caries noted throughout abscess noted in the upper molar. Lungs: CTABL Nl WOB Heart: RRR no MRG Abd: NABS, NT, ND, gravid positive fetal movement GYN: Normal appearing external genitalia also noted external hemorrhoids. Vaginal canal relatively normal as is the cervix which is closed. Thick white cottage cheese consistence vaginal discharge noted.   Wet prep: Multiple squamous cells seen no clue cells no trichomonas    budding yeast seen.

## 2011-02-13 NOTE — Patient Instructions (Signed)
Thank you for coming in today. 1) Tooth pain: I changed to vicodin and added penicillin antibiotics. Follow up with you regular doctor ([facility]).  2) Yeast infection. Try the cream I called in. Use as directed. If this does not work we will use the pill.  Good luck and take care.

## 2011-02-14 ENCOUNTER — Telehealth: Payer: Self-pay | Admitting: Family Medicine

## 2011-02-14 NOTE — Telephone Encounter (Signed)
Call and spoke to pt about tooth. Pt was recently seen 9/13 for this, placed on abx and vicodin. Pt states that pain has been excruciating, pt was wondering if she could have her c-section time moved up as early as possible because of tooth pain. Pt has been seen by oral surgeon for this with recommendation of tooth extraction inder general anesthesia. Written clearance for this was given on 01/08/11. Per pt, oral surgeon is uncomfortable with general anesthesia and would like to wait until after delivery for tooth extraction.  Instructed pt that it would take some time for abx to kick and to use up to 2 vicodins at once to help with pain as tolerated for the next 24-48 hours.  Attempted to call Dr. Earleen Reaper at [phone] to discuss case as to improtance of abscessed tooth extraction. He was still at lunch. Pager and clinic contact number was given for him to contact me about this. Secretary said that she would pass message on to the pt.

## 2011-02-14 NOTE — Telephone Encounter (Signed)
Pt is still having a really bad time with her tooth pain and can't get anything from oral surgeon.  She is wants to have a Csection asap in order to take care of this tooth.

## 2011-02-17 NOTE — Telephone Encounter (Signed)
Dr Ulla Potash office called her to say that he still didn't feel comfortable with sedation.  Pt has had counseling with C-Section counselor on 9/11.  Everything looks good with that in mind and she is wanting to go ahead and schedule C-Section so she can get this tooth taken care of.

## 2011-02-20 ENCOUNTER — Ambulatory Visit: Payer: Medicaid Other | Admitting: Family Medicine

## 2011-02-20 ENCOUNTER — Telehealth: Payer: Self-pay | Admitting: Family Medicine

## 2011-02-20 NOTE — Telephone Encounter (Signed)
Called to say that [facility] told her that her doctor would need to make referral for her to have C-Section - would not let her make her appt

## 2011-02-20 NOTE — Patient Instructions (Signed)
It was good to see you today, Continue with the antibiotic and pain medication as prescribed, Try taking one capful of miralax with 8ozs of water daily to help with your bowel movements. If you have any vaginal bleeding, or decreased fetal movement, please give [place] a call  Come back to see me in 1 week,  Call with any other questions,  God Bless,  Doree Albee MD

## 2011-02-20 NOTE — Telephone Encounter (Signed)
Called [facility]. We need to call and schedule appt for pt (30-32 weeks) for consultation. Fwd. To Dr.Caviness for order. Lorenda Hatchet, Renato Battles

## 2011-02-20 NOTE — Telephone Encounter (Signed)
Doree Albee saw this pt today, will forward this message to him and ask him if she needs referral to OB/GYN.  I am marked as pt's primary md but I have never seen this pt in the office.

## 2011-02-21 NOTE — Progress Notes (Signed)
Patient is here for routine OB visit. Patient denies any acute issues currently. Patient is noted to have a lingering tooth abscess that was originally scheduled to be surgically removed. However this has been deferred until after pregnancy as the oral surgeon desires for removal of this tooth until after pregnancy. Patient is noted to be currently on long-term narcotics as well as antibiotics with Percocet and penicillin. Patient has been tolerating these medications well no reported nausea or vomiting. Dental pain has been well-controlled Percocet. Patient does report some overall decreased by mouth intake over the past 2-3 weeks patient doesn't denies any issues with chilling. has had adequate by mouth intake per patient. Patient has had some chronic low back pain that is relieved with rest as well as fluid intake with working diagnoses of round ligament pain. No vaginal bleeding vaginal discharge. Patient was recent seen for a C-section counseling at [facility]. Patient states that she discussed a TOLAC,  however patient is considering doing a possible C-section as to expedite delivery so that dental abscess can be removed. Plan: Labor red flags as well as kick counts were discussed today. Will place patient on scheduled MiraLax in setting of chronic narcotic use. Plan for GBS GC and Chlamydia at visit next week. Patient referred back to [facility] for discussion of possible early C-section as currently but overall priority is 4 of expedite delivery as soon as to adjust this chronic dental abscess. Preferably I would like for this abscess to be surgically treated now however will defer this as the patient's oral surgeon desires for surgical removal until after delivery.

## 2011-02-27 ENCOUNTER — Ambulatory Visit: Payer: Medicaid Other | Admitting: Family Medicine

## 2011-02-27 NOTE — Patient Instructions (Signed)
It was good to see you today, Continue with the antibiotic and pain medication as prescribed, If you have any vaginal bleeding, or decreased fetal movement, please give us a call  Come back to see me in 1 week,  Call with any other questions,  God Bless,  Luan Urbani MD  

## 2011-02-28 NOTE — Progress Notes (Signed)
Patient is here for routine OB visit. No acute complaints or concerns currently. Both back and dental pain is currently well controlled. Patient is tolerating Percocet and penicillin well.  Plan: Labor red flags as well as kick counts were discussed today. GBS/GC/Chl performed today Will follow up in 1 week.

## 2011-03-02 LAB — CULTURE, BETA STREP (GROUP B ONLY)

## 2011-03-03 ENCOUNTER — Encounter (HOSPITAL_BASED_OUTPATIENT_CLINIC_OR_DEPARTMENT_OTHER): Payer: Medicaid Other | Admitting: Obstetrics and Gynecology

## 2011-03-03 NOTE — Progress Notes (Signed)
30 y.o. [id number] at [redacted]w[redacted]d with Estimated Date of Delivery: 03/22/11 was seen today in office to discuss elective repeat cesarean delivery.   The patient was previously seen and had elected for a trial of labor. However, she has a dental abscess which requires surgical intervention and would like to start planning for her dental procedure.  For that reason, she has now elected to have an elective repeat cesarean section. Risk, benefits and alternatives were again discussed with the patient, including but not limited to risk of bleeding, infection and damage to adjacent organs.   Patient also desires bilateral tubal ligation at the time of R C/S, also discussed the additional risks of regret, failure rate of 0.5-1%  with increased risk of ectopic gestation and permanent and irreversible nature of the procedure.  Other forms of reversible BCM discussed, also discussed vasectomy, patient desires BTL. Bilateral tubal ligation consent form was signed today in our office.  All her questions answered the patient will be contacted with the date and time of her repeat cesarean section with tubal ligation which will be scheduled at 39 weeks on 10/15.

## 2011-03-05 ENCOUNTER — Ambulatory Visit: Payer: Medicaid Other | Admitting: Family Medicine

## 2011-03-05 NOTE — Patient Instructions (Signed)
It was good to see you today, Continue with the antibiotic and pain medication as prescribed, If you have any vaginal bleeding, or decreased fetal movement, please give [place] a call  Come back to see me in 1 week,  Call with any other questions,  God Bless,  Doree Albee MD

## 2011-03-05 NOTE — Progress Notes (Signed)
Patient is here for routine OB visit. No acute complaints or concerns currently. Both back and dental pain is currently well controlled. Patient is tolerating Percocet and penicillin well. Pt had follow up appt with OB-GYN. Pt planned for c-section at 39 weeks as well as BTL on 03/17/11.  Plan:  Labor red flags as well as kick counts were discussed today.  Will follow up in 1 week.

## 2011-03-11 ENCOUNTER — Encounter: Payer: Medicaid Other | Admitting: Family Medicine

## 2011-03-12 ENCOUNTER — Ambulatory Visit: Payer: Medicaid Other | Admitting: Family Medicine

## 2011-03-12 NOTE — Patient Instructions (Addendum)
   Your procedure is scheduled on: Monday, Oct. 15, 2012 at 1:00pm  Enter through the [employer] of [facility] at: 1130am Pick up the phone at the desk and dial [phone] and inform us of your arrival.  Please call this number if you have any problems the morning of surgery: [phone]  Remember: Do not eat food after midnight: Sunday Do not drink clear liquids after: 9:00am Monday  Take these medicines the morning of surgery with a SIP OF WATER:  Antibotic and vicodin  Do not wear jewelry, make-up, or FINGER nail polish Do not wear lotions, powders, or perfumes.  You may wear deodorant. Do not shave 48 hours prior to surgery. Do not bring valuables to the hospital.  Leave suitcase in the car. After Surgery it may be brought to your room. For patients being admitted to the hospital, checkout time is 11:00am the day of discharge.  Patients discharged on the day of surgery will not be allowed to drive home.   Name and phone number of your driver: [employer] - Harley Hallmark  [phone]  Remember to use your hibiclens as instructed.Please shower with 1/2 bottle the evening before your surgery and the other 1/2 bottle the morning of surgery.

## 2011-03-12 NOTE — Patient Instructions (Signed)
It was good to see you today, Continue with the antibiotic and pain medication as prescribed, If you have any vaginal bleeding, or decreased fetal movement, please give [place] a call  I will see you at your C-Section Call with any other questions,  God Bless,  Doree Albee MD

## 2011-03-12 NOTE — Progress Notes (Signed)
Patient is here for routine OB visit. No acute complaints or concerns currently. Both back and dental pain is currently well controlled. Patient is tolerating Percocet and penicillin well. Pt had follow up appt with OB-GYN. Pt planned for c-section at 39 weeks as well as BTL on 03/17/11. Pt also ade oral surgeon aware of c-section with planned tooth removal s/p c-section.  Plan:  Labor red flags as well as kick counts were discussed today.  Plan for c-section on 15th with hopeful tooth extraction in weeks proceeding c-section.

## 2011-03-13 ENCOUNTER — Encounter (HOSPITAL_COMMUNITY)
Admission: RE | Admit: 2011-03-13 | Discharge: 2011-03-13 | Disposition: A | Discharge status: Home / Self Care | Payer: Medicaid Other | Source: Ambulatory Visit | Attending: Obstetrics & Gynecology | Admitting: Obstetrics & Gynecology

## 2011-03-13 ENCOUNTER — Encounter (HOSPITAL_COMMUNITY): Payer: Self-pay

## 2011-03-13 HISTORY — DX: Depression, unspecified: F32.A

## 2011-03-13 HISTORY — DX: Other specified postprocedural states: Z98.890

## 2011-03-13 HISTORY — DX: Other specified postprocedural states: R11.2

## 2011-03-13 LAB — CBC
HCT: 33.9 % — ABNORMAL LOW (ref 36.0–46.0)
MCH: 28.7 pg (ref 26.0–34.0)
MCHC: 32.7 g/dL (ref 30.0–36.0)
MCV: 87.6 fL (ref 78.0–100.0)
Platelets: 299 10*3/uL (ref 150–400)
RDW: 14.8 % (ref 11.5–15.5)
WBC: 8.6 10*3/uL (ref 4.0–10.5)

## 2011-03-16 MED ORDER — DEXTROSE 5 % IV SOLN
2.0000 g | INTRAVENOUS | Status: AC
  Administered 2011-03-17: 2 g via INTRAVENOUS
  Filled 2011-03-16: qty 2

## 2011-03-17 ENCOUNTER — Encounter (HOSPITAL_COMMUNITY): Payer: Self-pay | Admitting: Anesthesiology

## 2011-03-17 ENCOUNTER — Inpatient Hospital Stay (HOSPITAL_COMMUNITY): Payer: Medicaid Other | Admitting: Anesthesiology

## 2011-03-17 ENCOUNTER — Encounter (HOSPITAL_COMMUNITY)
Admission: RE | Disposition: A | Discharge status: Home / Self Care | Payer: Self-pay | Source: Ambulatory Visit | Attending: Obstetrics & Gynecology

## 2011-03-17 ENCOUNTER — Inpatient Hospital Stay (HOSPITAL_COMMUNITY)
Admission: RE | Admit: 2011-03-17 | Discharge: 2011-03-20 | DRG: 766 | Disposition: A | Discharge status: Home / Self Care | Payer: Medicaid Other | Source: Ambulatory Visit | Attending: Obstetrics & Gynecology | Admitting: Obstetrics & Gynecology

## 2011-03-17 DIAGNOSIS — O34219 Maternal care for unspecified type scar from previous cesarean delivery: Principal | ICD-10-CM | POA: Diagnosis present

## 2011-03-17 DIAGNOSIS — Z98891 History of uterine scar from previous surgery: Secondary | ICD-10-CM

## 2011-03-17 DIAGNOSIS — Z302 Encounter for sterilization: Secondary | ICD-10-CM

## 2011-03-17 LAB — TYPE AND SCREEN: Antibody Screen: NEGATIVE

## 2011-03-17 LAB — ABO/RH: ABO/RH(D): O POS

## 2011-03-17 SURGERY — Surgical Case
Anesthesia: Regional | Site: Uterus | Wound class: Clean Contaminated

## 2011-03-17 MED ORDER — NALOXONE HCL 0.4 MG/ML IJ SOLN: 0.4000 mg | INTRAMUSCULAR | Status: DC | PRN

## 2011-03-17 MED ORDER — NALBUPHINE HCL 10 MG/ML IJ SOLN: 5.0000 mg | INTRAMUSCULAR | Status: DC | PRN

## 2011-03-17 MED ORDER — MEPERIDINE HCL 25 MG/ML IJ SOLN: 6.2500 mg | INTRAMUSCULAR | Status: DC | PRN

## 2011-03-17 MED ORDER — SCOPOLAMINE 1 MG/3DAYS TD PT72: 1.0000 | MEDICATED_PATCH | Freq: Once | TRANSDERMAL | Status: DC

## 2011-03-17 MED ORDER — NALBUPHINE HCL 10 MG/ML IJ SOLN
5.0000 mg | INTRAMUSCULAR | Status: DC | PRN
  Administered 2011-03-18: 10 mg via INTRAVENOUS
  Filled 2011-03-17: qty 1

## 2011-03-17 MED ORDER — KETOROLAC TROMETHAMINE 60 MG/2ML IM SOLN
60.0000 mg | Freq: Once | INTRAMUSCULAR | Status: AC | PRN
  Administered 2011-03-17: 60 mg via INTRAMUSCULAR

## 2011-03-17 MED ORDER — DIPHENHYDRAMINE HCL 50 MG/ML IJ SOLN: 25.0000 mg | INTRAMUSCULAR | Status: DC | PRN

## 2011-03-17 MED ORDER — ONDANSETRON HCL 4 MG/2ML IJ SOLN: 4.0000 mg | Freq: Three times a day (TID) | INTRAMUSCULAR | Status: DC | PRN

## 2011-03-17 MED ORDER — METOCLOPRAMIDE HCL 5 MG/ML IJ SOLN: 10.0000 mg | Freq: Three times a day (TID) | INTRAMUSCULAR | Status: DC | PRN

## 2011-03-17 MED ORDER — LACTATED RINGERS IV SOLN
INTRAVENOUS | Status: DC
  Administered 2011-03-17 (×4): via INTRAVENOUS

## 2011-03-17 MED ORDER — IBUPROFEN 600 MG PO TABS
600.0000 mg | ORAL_TABLET | Freq: Four times a day (QID) | ORAL | Status: DC | PRN
  Filled 2011-03-17 (×4): qty 1

## 2011-03-17 MED ORDER — OXYCODONE-ACETAMINOPHEN 5-325 MG PO TABS
1.0000 | ORAL_TABLET | ORAL | Status: DC | PRN
  Administered 2011-03-18 (×2): 1 via ORAL
  Administered 2011-03-18 – 2011-03-20 (×7): 2 via ORAL
  Filled 2011-03-17 (×4): qty 2
  Filled 2011-03-17 (×2): qty 1
  Filled 2011-03-17 (×3): qty 2

## 2011-03-17 MED ORDER — SIMETHICONE 80 MG PO CHEW
80.0000 mg | CHEWABLE_TABLET | Freq: Three times a day (TID) | ORAL | Status: DC
  Administered 2011-03-17 – 2011-03-20 (×8): 80 mg via ORAL

## 2011-03-17 MED ORDER — ZOLPIDEM TARTRATE 5 MG PO TABS: 5.0000 mg | ORAL_TABLET | Freq: Every evening | ORAL | Status: DC | PRN

## 2011-03-17 MED ORDER — SODIUM CHLORIDE 0.9 % IV SOLN: 1.0000 ug/kg/h | INTRAVENOUS | Status: DC | PRN

## 2011-03-17 MED ORDER — ONDANSETRON HCL 4 MG/2ML IJ SOLN
INTRAMUSCULAR | Status: AC
  Filled 2011-03-17: qty 2

## 2011-03-17 MED ORDER — SIMETHICONE 80 MG PO CHEW: 80.0000 mg | CHEWABLE_TABLET | ORAL | Status: DC | PRN

## 2011-03-17 MED ORDER — WITCH HAZEL-GLYCERIN EX PADS: 1.0000 "application " | MEDICATED_PAD | CUTANEOUS | Status: DC | PRN

## 2011-03-17 MED ORDER — IBUPROFEN 600 MG PO TABS: 600.0000 mg | ORAL_TABLET | Freq: Four times a day (QID) | ORAL | Status: DC | PRN

## 2011-03-17 MED ORDER — KETOROLAC TROMETHAMINE 30 MG/ML IJ SOLN
30.0000 mg | Freq: Four times a day (QID) | INTRAMUSCULAR | Status: AC | PRN
  Administered 2011-03-17: 30 mg via INTRAMUSCULAR
  Filled 2011-03-17: qty 1

## 2011-03-17 MED ORDER — DIPHENHYDRAMINE HCL 25 MG PO CAPS
25.0000 mg | ORAL_CAPSULE | Freq: Four times a day (QID) | ORAL | Status: DC | PRN
  Administered 2011-03-17: 25 mg via ORAL

## 2011-03-17 MED ORDER — SCOPOLAMINE 1 MG/3DAYS TD PT72
1.0000 | MEDICATED_PATCH | Freq: Once | TRANSDERMAL | Status: DC
  Administered 2011-03-17: 1.5 mg via TRANSDERMAL

## 2011-03-17 MED ORDER — FENTANYL CITRATE 0.05 MG/ML IJ SOLN: 25.0000 ug | INTRAMUSCULAR | Status: DC | PRN

## 2011-03-17 MED ORDER — DIBUCAINE 1 % RE OINT: 1.0000 "application " | TOPICAL_OINTMENT | RECTAL | Status: DC | PRN

## 2011-03-17 MED ORDER — PHENYLEPHRINE 40 MCG/ML (10ML) SYRINGE FOR IV PUSH (FOR BLOOD PRESSURE SUPPORT)
PREFILLED_SYRINGE | INTRAVENOUS | Status: AC
  Filled 2011-03-17: qty 5

## 2011-03-17 MED ORDER — BISACODYL 10 MG RE SUPP: 10.0000 mg | Freq: Every day | RECTAL | Status: DC | PRN

## 2011-03-17 MED ORDER — SENNOSIDES-DOCUSATE SODIUM 8.6-50 MG PO TABS
2.0000 | ORAL_TABLET | Freq: Every day | ORAL | Status: DC
  Administered 2011-03-17 – 2011-03-19 (×3): 2 via ORAL

## 2011-03-17 MED ORDER — BUPIVACAINE IN DEXTROSE 0.75-8.25 % IT SOLN
INTRATHECAL | Status: DC | PRN
  Administered 2011-03-17: 11.75 mg via INTRATHECAL

## 2011-03-17 MED ORDER — ONDANSETRON HCL 4 MG PO TABS: 4.0000 mg | ORAL_TABLET | ORAL | Status: DC | PRN

## 2011-03-17 MED ORDER — DIPHENHYDRAMINE HCL 50 MG/ML IJ SOLN: 12.5000 mg | INTRAMUSCULAR | Status: DC | PRN

## 2011-03-17 MED ORDER — OXYTOCIN 10 UNIT/ML IJ SOLN
INTRAMUSCULAR | Status: AC
  Filled 2011-03-17: qty 2

## 2011-03-17 MED ORDER — ONDANSETRON HCL 4 MG/2ML IJ SOLN: 4.0000 mg | INTRAMUSCULAR | Status: DC | PRN

## 2011-03-17 MED ORDER — MENTHOL 3 MG MT LOZG: 1.0000 | LOZENGE | OROMUCOSAL | Status: DC | PRN

## 2011-03-17 MED ORDER — MORPHINE SULFATE (PF) 0.5 MG/ML IJ SOLN
INTRAMUSCULAR | Status: DC | PRN
  Administered 2011-03-17: .1 ug via INTRATHECAL

## 2011-03-17 MED ORDER — FERROUS SULFATE 325 (65 FE) MG PO TABS
325.0000 mg | ORAL_TABLET | Freq: Two times a day (BID) | ORAL | Status: DC
  Administered 2011-03-18 – 2011-03-20 (×5): 325 mg via ORAL
  Filled 2011-03-17 (×5): qty 1

## 2011-03-17 MED ORDER — ONDANSETRON HCL 4 MG/2ML IJ SOLN
INTRAMUSCULAR | Status: DC | PRN
  Administered 2011-03-17: 4 mg via INTRAVENOUS

## 2011-03-17 MED ORDER — MORPHINE SULFATE 0.5 MG/ML IJ SOLN
INTRAMUSCULAR | Status: AC
  Filled 2011-03-17: qty 10

## 2011-03-17 MED ORDER — PHENYLEPHRINE HCL 10 MG/ML IJ SOLN
INTRAMUSCULAR | Status: DC | PRN
  Administered 2011-03-17 (×2): 120 ug via INTRAVENOUS

## 2011-03-17 MED ORDER — FENTANYL CITRATE 0.05 MG/ML IJ SOLN
INTRAMUSCULAR | Status: AC
  Filled 2011-03-17: qty 2

## 2011-03-17 MED ORDER — DIPHENHYDRAMINE HCL 25 MG PO CAPS
25.0000 mg | ORAL_CAPSULE | ORAL | Status: DC | PRN
  Administered 2011-03-18 (×2): 25 mg via ORAL
  Filled 2011-03-17 (×3): qty 1

## 2011-03-17 MED ORDER — IBUPROFEN 600 MG PO TABS
600.0000 mg | ORAL_TABLET | Freq: Four times a day (QID) | ORAL | Status: DC
  Administered 2011-03-18 – 2011-03-20 (×10): 600 mg via ORAL
  Filled 2011-03-17 (×5): qty 1

## 2011-03-17 MED ORDER — SODIUM CHLORIDE 0.9 % IJ SOLN: 3.0000 mL | INTRAMUSCULAR | Status: DC | PRN

## 2011-03-17 MED ORDER — LACTATED RINGERS IV SOLN
INTRAVENOUS | Status: DC
  Administered 2011-03-17: 17:00:00 via INTRAVENOUS

## 2011-03-17 MED ORDER — EPHEDRINE 5 MG/ML INJ
INTRAVENOUS | Status: AC
  Filled 2011-03-17: qty 10

## 2011-03-17 MED ORDER — KETOROLAC TROMETHAMINE 30 MG/ML IJ SOLN: 15.0000 mg | Freq: Once | INTRAMUSCULAR | Status: DC | PRN

## 2011-03-17 MED ORDER — KETOROLAC TROMETHAMINE 60 MG/2ML IM SOLN
INTRAMUSCULAR | Status: AC
  Filled 2011-03-17: qty 2

## 2011-03-17 MED ORDER — TETANUS-DIPHTH-ACELL PERTUSSIS 5-2.5-18.5 LF-MCG/0.5 IM SUSP
0.5000 mL | Freq: Once | INTRAMUSCULAR | Status: AC
  Administered 2011-03-18: 0.5 mL via INTRAMUSCULAR
  Filled 2011-03-17: qty 0.5

## 2011-03-17 MED ORDER — KETOROLAC TROMETHAMINE 30 MG/ML IJ SOLN: 30.0000 mg | Freq: Four times a day (QID) | INTRAMUSCULAR | Status: DC | PRN

## 2011-03-17 MED ORDER — SCOPOLAMINE 1 MG/3DAYS TD PT72
MEDICATED_PATCH | TRANSDERMAL | Status: AC
  Administered 2011-03-17: 1.5 mg via TRANSDERMAL
  Filled 2011-03-17: qty 1

## 2011-03-17 MED ORDER — ACETAMINOPHEN 325 MG PO TABS: 325.0000 mg | ORAL_TABLET | ORAL | Status: DC | PRN

## 2011-03-17 MED ORDER — NALBUPHINE HCL 10 MG/ML IJ SOLN
5.0000 mg | INTRAMUSCULAR | Status: DC | PRN
  Administered 2011-03-17: 10 mg via SUBCUTANEOUS
  Filled 2011-03-17: qty 1

## 2011-03-17 MED ORDER — OXYTOCIN 20 UNITS IN LACTATED RINGERS INFUSION - SIMPLE
INTRAVENOUS | Status: DC | PRN
  Administered 2011-03-17 (×2): 20 [IU] via INTRAVENOUS

## 2011-03-17 MED ORDER — PROMETHAZINE HCL 25 MG/ML IJ SOLN: 6.2500 mg | INTRAMUSCULAR | Status: DC | PRN

## 2011-03-17 MED ORDER — LANOLIN HYDROUS EX OINT: 1.0000 "application " | TOPICAL_OINTMENT | CUTANEOUS | Status: DC | PRN

## 2011-03-17 MED ORDER — DIPHENHYDRAMINE HCL 25 MG PO CAPS: 25.0000 mg | ORAL_CAPSULE | ORAL | Status: DC | PRN

## 2011-03-17 MED ORDER — ACETAMINOPHEN 10 MG/ML IV SOLN: 1000.0000 mg | Freq: Four times a day (QID) | INTRAVENOUS | Status: AC | PRN

## 2011-03-17 MED ORDER — FLEET ENEMA 7-19 GM/118ML RE ENEM: 1.0000 | ENEMA | RECTAL | Status: DC | PRN

## 2011-03-17 MED ORDER — BUPIVACAINE HCL (PF) 0.25 % IJ SOLN
INTRAMUSCULAR | Status: DC | PRN
  Administered 2011-03-17: 20 mL

## 2011-03-17 MED ORDER — OXYTOCIN 20 UNITS IN LACTATED RINGERS INFUSION - SIMPLE: 125.0000 mL/h | INTRAVENOUS | Status: AC

## 2011-03-17 MED ORDER — HYDROMORPHONE HCL 1 MG/ML IJ SOLN: 0.2500 mg | INTRAMUSCULAR | Status: DC | PRN

## 2011-03-17 MED ORDER — PRENATAL PLUS 27-1 MG PO TABS
1.0000 | ORAL_TABLET | Freq: Every day | ORAL | Status: DC
  Administered 2011-03-18 – 2011-03-20 (×3): 1 via ORAL
  Filled 2011-03-17 (×3): qty 1

## 2011-03-17 MED ORDER — FENTANYL CITRATE 0.05 MG/ML IJ SOLN
INTRAMUSCULAR | Status: DC | PRN
  Administered 2011-03-17: 15 ug via INTRATHECAL

## 2011-03-17 SURGICAL SUPPLY — 35 items
APL SKNCLS STERI-STRIP NONHPOA (GAUZE/BANDAGES/DRESSINGS) ×1
BENZOIN TINCTURE PRP APPL 2/3 (GAUZE/BANDAGES/DRESSINGS) ×1 IMPLANT
CHLORAPREP W/TINT 26ML (MISCELLANEOUS) ×2 IMPLANT
CLIP FILSHIE TUBAL LIGA STRL (Clip) ×1 IMPLANT
CLOTH BEACON ORANGE TIMEOUT ST (SAFETY) ×2 IMPLANT
DRESSING TELFA 8X3 (GAUZE/BANDAGES/DRESSINGS) ×2 IMPLANT
ELECT REM PT RETURN 9FT ADLT (ELECTROSURGICAL) ×2
ELECTRODE REM PT RTRN 9FT ADLT (ELECTROSURGICAL) ×1 IMPLANT
EXTRACTOR VACUUM M CUP 4 TUBE (SUCTIONS) IMPLANT
GAUZE SPONGE 4X4 12PLY STRL LF (GAUZE/BANDAGES/DRESSINGS) ×3 IMPLANT
GLOVE BIO SURGEON STRL SZ7 (GLOVE) ×2 IMPLANT
GLOVE BIOGEL PI IND STRL 7.0 (GLOVE) ×2 IMPLANT
GLOVE BIOGEL PI INDICATOR 7.0 (GLOVE) ×2
GOWN PREVENTION PLUS LG XLONG (DISPOSABLE) ×4 IMPLANT
GOWN STRL REIN XL XLG (GOWN DISPOSABLE) ×5 IMPLANT
NDL HYPO 25X5/8 SAFETYGLIDE (NEEDLE) ×1 IMPLANT
NEEDLE HYPO 22GX1.5 SAFETY (NEEDLE) ×2 IMPLANT
NEEDLE HYPO 25X5/8 SAFETYGLIDE (NEEDLE) ×2 IMPLANT
NS IRRIG 1000ML POUR BTL (IV SOLUTION) ×2 IMPLANT
PACK C SECTION WH (CUSTOM PROCEDURE TRAY) ×2 IMPLANT
PAD ABD 7.5X8 STRL (GAUZE/BANDAGES/DRESSINGS) IMPLANT
RTRCTR C-SECT PINK 25CM LRG (MISCELLANEOUS) ×1 IMPLANT
SLEEVE SCD COMPRESS KNEE MED (MISCELLANEOUS) IMPLANT
STRIP CLOSURE SKIN 1/2X4 (GAUZE/BANDAGES/DRESSINGS) ×1 IMPLANT
SUT MNCRL 0 VIOLET CTX 36 (SUTURE) ×2 IMPLANT
SUT MONOCRYL 0 CTX 36 (SUTURE) ×2
SUT PDS AB 0 CT1 27 (SUTURE) IMPLANT
SUT VIC AB 0 CT1 36 (SUTURE) ×4 IMPLANT
SUT VIC AB 2-0 CT1 27 (SUTURE)
SUT VIC AB 2-0 CT1 TAPERPNT 27 (SUTURE) IMPLANT
SUT VIC AB 4-0 KS 27 (SUTURE) ×1 IMPLANT
SYR CONTROL 10ML LL (SYRINGE) ×2 IMPLANT
TOWEL OR 17X24 6PK STRL BLUE (TOWEL DISPOSABLE) ×4 IMPLANT
TRAY FOLEY CATH 14FR (SET/KITS/TRAYS/PACK) ×2 IMPLANT
WATER STERILE IRR 1000ML POUR (IV SOLUTION) ×2 IMPLANT

## 2011-03-17 NOTE — H&P (Signed)
Leah Baker is a 30 y.o. female presenting for RTLCS and BTL .Maternal Medical History:  Reason for admission: Scheduled RTLCS   Fetal activity: Perceived fetal activity is normal.   Last perceived fetal movement was within the past hour.    Prenatal complications: Infection (ongoing treatment for dental abscess. Peding tooth extraction s/p c-section ).   Prenatal Complications - Diabetes: none.    OB History    Grav Para Term Preterm Abortions TAB SAB Ect Mult Living   4 1 1  0 2 2 0 0 0 1     Past Medical History  Diagnosis Date  . Anemia   . PONV (postoperative nausea and vomiting)   . Depression     no meds  . Tooth abscess     currently on abx and vicodin for pain   Past Surgical History  Procedure Date  . Cesarean section   . Iud removal 07/2009    IUD strings were attached to uterine wall, had to be operatively removed.   . Wisdom tooth extraction    Family History: family history is not on file. Social History:  reports that she quit smoking about 2 years ago. Her smoking use included Cigarettes. She has a .25 pack-year smoking history. She has never used smokeless tobacco. She reports that she drinks alcohol. She reports that she uses illicit drugs (Marijuana) about 7 times per week.  Review of Systems  Constitutional: Negative.   HENT:       +intermittent tooth pain   Eyes: Negative.   Respiratory: Negative.   Cardiovascular: Negative.   Gastrointestinal: Negative.   Genitourinary: Negative.   Musculoskeletal: Positive for back pain.  Skin: Negative.   Neurological: Negative.   Endo/Heme/Allergies: Negative.   Psychiatric/Behavioral: Negative.       Blood pressure 126/80, pulse 95, temperature 98.4 F (36.9 C), temperature source Oral, resp. rate 18, last menstrual period 06/15/2010, SpO2 99.00%. Maternal Exam:  Abdomen: Patient reports no abdominal tenderness. Surgical scars: low transverse.   Fundal height is 39.   Estimated fetal weight is 7  pounds.   Fetal presentation: vertex  Introitus: Normal vulva. Normal vagina.  Ferning test: not done.  Nitrazine test: not done.     Physical Exam  Constitutional: She is oriented to person, place, and time. She appears well-developed.  HENT:  Head: Normocephalic and atraumatic.  Eyes: Pupils are equal, round, and reactive to light.  Neck: Normal range of motion. Neck supple.  Cardiovascular: Normal rate and regular rhythm.   Respiratory: Effort normal and breath sounds normal.  GI:       Gravid abdomen appropriate for dating, + fetal movement  Genitourinary: Vagina normal.  Musculoskeletal: Normal range of motion.  Neurological: She is alert and oriented to person, place, and time. She has normal reflexes.  Skin: Skin is warm.  Psychiatric: She has a normal mood and affect.       Mildly nervous about procedure, but also excited     Prenatal labs: ABO, Rh: --/--/O POS (10/15 1140) Antibody: PENDING (10/15 1140) Rubella: 23.2 (03/16 2105) RPR: NON REACTIVE (10/11 1401)  HBsAg: NEGATIVE (03/16 2105)  HIV: NON REACTIVE (07/26 1108)  GBS:   negative   Assessment/Plan: Admission to for RLTCS and BTL Plan for breast and bottle feeding Anticipate routine post-partum course.   Karem Tomaso 03/17/2011, 12:32 PM

## 2011-03-17 NOTE — Transfer of Care (Signed)
Immediate Anesthesia Transfer of Care Note  Patient: Leah Baker  Procedure(s) Performed:  CESAREAN SECTION  Patient Location: PACU  Anesthesia Type: Spinal  Level of Consciousness: awake, alert , oriented and patient cooperative  Airway & Oxygen Therapy: Patient Spontanous Breathing  Post-op Assessment: Report given to PACU RN and Post -op Vital signs reviewed and stable  Post vital signs: Reviewed and stable  Complications: No apparent anesthesia complications

## 2011-03-17 NOTE — Anesthesia Postprocedure Evaluation (Signed)
Anesthesia Post Note  Patient: Leah Baker  Procedure(s) Performed:  CESAREAN SECTION  Anesthesia type: Spinal  Patient location: PACU  Post pain: Pain level controlled  Post assessment: Post-op Vital signs reviewed  Last Vitals:  Filed Vitals:   03/17/11 1545  BP:   Pulse: 77  Temp:   Resp: 18    Post vital signs: Reviewed  Level of consciousness: awake  Complications: No apparent anesthesia complications

## 2011-03-17 NOTE — Anesthesia Preprocedure Evaluation (Signed)
Anesthesia Evaluation  Name, MR# and [name] Patient awake  General Assessment Comment  Reviewed: Allergy & Precautions, H&P , Patient's Chart, lab work & pertinent test results  History of Anesthesia Complications (+) PONV  Airway Mallampati: II TM Distance: >3 FB Neck ROM: full    Dental No notable dental hx.    Pulmonary  clear to auscultation  Pulmonary exam normal       Cardiovascular regular Normal    Neuro/Psych Negative Neurological ROS  Negative Psych ROS   GI/Hepatic negative GI ROS Neg liver ROS    Endo/Other  Negative Endocrine ROS  Renal/GU negative Renal ROS     Musculoskeletal   Abdominal   Peds  Hematology negative hematology ROS (+)   Anesthesia Other Findings   Reproductive/Obstetrics (+) Pregnancy                           Anesthesia Physical Anesthesia Plan  ASA: II  Anesthesia Plan: Spinal   Post-op Pain Management:    Induction:   Airway Management Planned:   Additional Equipment:   Intra-op Plan:   Post-operative Plan:   Informed Consent: I have reviewed the patients History and Physical, chart, labs and discussed the procedure including the risks, benefits and alternatives for the proposed anesthesia with the patient or authorized representative who has indicated his/her understanding and acceptance.     Plan Discussed with:   Anesthesia Plan Comments:         Anesthesia Quick Evaluation

## 2011-03-17 NOTE — Transfer of Care (Incomplete)
Immediate Anesthesia Transfer of Care Note  Patient: Leah Baker  Procedure(s) Performed:  CESAREAN SECTION  Patient Location: PACU  Anesthesia Type: Spinal  Level of Consciousness: awake, alert , oriented and patient cooperative  Airway & Oxygen Therapy: Patient Spontanous Breathing and Patient connected to nasal cannula oxygen  Post-op Assessment: Report given to PACU RN and Post -op Vital signs reviewed and stable  Post vital signs: Reviewed and stable  Complications: No apparent anesthesia complications

## 2011-03-17 NOTE — Op Note (Signed)
Cesarean Section Procedure Note  Indications: patient declines vag del attempt  Pre-operative Diagnosis: 39 week 2 day pregnancy.  Post-operative Diagnosis: same  Surgeon: Jaynie Collins, MD  Assistants: Lucina Mellow, DO and Doree Albee, MD  Anesthesia: Spinal anesthesia  ASA Class: 2   Procedure Details   The patient was seen in the Holding Room. The risks, benefits, complications, treatment options, and expected outcomes were discussed with the patient.  The patient concurred with the proposed plan, giving informed consent.  The site of surgery properly noted/marked. The patient was taken to Operating Room # 9, identified as Leah Baker and the procedure verified as C-Section Delivery. A Time Out was held and the above information confirmed.  After induction of anesthesia, the patient was draped and prepped in the usual sterile manner. A Pfannenstiel incision was made and carried down through the subcutaneous tissue to the fascia. Fascial incision was made and extended transversely. The fascia was separated from the underlying rectus tissue superiorly and inferiorly. The peritoneum was identified and entered. Peritoneal incision was extended longitudinally. An Alexis retractor was placed into the abdomen with good visualization of the uterus. A low transverse uterine incision was made. Delivered from cephalic presentation was a  7 pounds 8.6 ounce Female with Apgar scores of 9 at one minute and 9 at five minutes. After the umbilical cord was clamped and cut cord blood was obtained for evaluation. The placenta was removed intact and appeared normal. The uterine outline, tubes and ovaries appeared normal. The uterine incision was closed with running locked sutures of Monocryl in two layers. Hemostasis was observed.   Attention was then turned to the patient's right ovary which was grasped with the Babcock clamps and a Filshie clip was placed. In a similar fashion, a Filshie clip was  placed on the patient's left tube. Tubes were returned to the abdomen. A single stitch was placed to reapproximate the rectus muscles. The fascia was then reapproximated with running sutures of PDS. The skin was reapproximated with Vicryl.  Instrument, sponge, and needle counts were correct prior the abdominal closure and at the conclusion of the case.   Findings: Viable infant female, normal uterus, tubes, ovaries.  Estimated Blood Loss:  850 mL        Total IV Fluids:  3000 ml         Specimens: Placenta to L&D         Complications:  None; patient tolerated the procedure well.         Disposition: PACU - hemodynamically stable.         Condition: stable

## 2011-03-17 NOTE — Preoperative (Signed)
Beta Blockers   Reason not to administer Beta Blockers:Not Applicable 

## 2011-03-17 NOTE — Anesthesia Procedure Notes (Addendum)
Spinal Block  Patient location during procedure: OR Start time: 03/17/2011 1:15 PM Staffing Performed by: anesthesiologist  Preanesthetic Checklist Completed: patient identified, site marked, surgical consent, pre-op evaluation, timeout performed, IV checked, risks and benefits discussed and monitors and equipment checked Spinal Block Patient position: sitting Prep: site prepped and draped and DuraPrep Patient monitoring: heart rate, cardiac monitor, continuous pulse ox and blood pressure Approach: midline Location: L3-4 Injection technique: single-shot Needle Needle type: Sprotte  Needle gauge: 24 G Needle length: 9 cm Assessment Sensory level: T4 Additional Notes Clear free flow CSF on first attempt.  Jasmine December, MD

## 2011-03-18 ENCOUNTER — Encounter (HOSPITAL_COMMUNITY): Payer: Self-pay | Admitting: *Deleted

## 2011-03-18 LAB — CBC
Hemoglobin: 9.8 g/dL — ABNORMAL LOW (ref 12.0–15.0)
Platelets: 261 10*3/uL (ref 150–400)
RBC: 3.4 MIL/uL — ABNORMAL LOW (ref 3.87–5.11)
WBC: 10.6 10*3/uL — ABNORMAL HIGH (ref 4.0–10.5)

## 2011-03-18 NOTE — Progress Notes (Addendum)
Subjective: Postpartum Day1c: Cesarean Delivery Patient reports tolerating PO, + flatus and no problems voiding.    Objective: Vital signs in last 24 hours: Temp:  [97.4 F (36.3 C)-98.6 F (37 C)] 97.4 F (36.3 C) (10/16 0500) Pulse Rate:  [75-95] 91  (10/16 0500) Resp:  [14-22] 18  (10/16 0500) BP: (104-126)/(69-85) 104/69 mmHg (10/16 0500) SpO2:  [97 %-100 %] 98 % (10/16 0600) Weight:  [186 lb (84.369 kg)] 186 lb (84.369 kg) (10/15 1715)  Physical Exam:  General: alert and cooperative Lochia: appropriate Uterine Fundus: firm Incision: healing well, no significant drainage DVT Evaluation: No evidence of DVT seen on physical exam.   Basename 03/18/11 0510  HGB 9.8*  HCT 30.2*    Assessment/Plan: Status post Cesarean section. Doing well postoperatively.  Continue current care. S/p BTL for birth control   NEWTON,STEVEN 03/18/2011, 8:43 AM

## 2011-03-18 NOTE — Progress Notes (Signed)
PSYCHOSOCIAL ASSESSMENT ~ MATERNAL/CHILD  Name: Leah Baker Age: 30  Referral Date: 16 / 80 / 75  Reason/Source: History of MJ & Etoh / CN  I. FAMILY/HOME ENVIRONMENT  A. Child's Legal Guardian _X__Parent(s) ___Grandparent ___Foster parent ___DSS_________________  Name: Thamas Jaegers [name]: // Age: 30  Address: [address]  Name: Harley Hallmark [name]: // Age: 51  Address: (same as above)  B. Other Household Members/Support Persons Name: Mailyn Steichen Relationship:daughter [name] 01/15/02  Name: Relationship: [name] ___/___/___  Name: Relationship: [name] ___/___/___  Name: Relationship: [name] ___/___/___  C. Other Support:  II. PSYCHOSOCIAL DATA A. Information Source _X_Patient Interview __Family Interview __Other___________ B. Surveyor, quantity and [employer] __Employment:  _X_Medicaid [place]: [place] __Private Insurance: __Self Pay  _X_Food Stamps _X_WIC __Work First __Public Housing __Section 8  __Maternity Care Coordination/Child Service Coordination/Early Intervention  ___School: Grade:  __Other:  Salena Saner Cultural and Environment Information Cultural Issues Impacting Care:  III. STRENGTHS _X__Supportive family/friends  _X__Adequate Resources  ___Compliance with medical plan  _X__Home prepared for Child (including basic supplies)  ___Understanding of illness  ___Other:  RISK FACTORS AND CURRENT PROBLEMS ____No Problems Noted  History of MJ and Etoh  IV. SOCIAL WORK ASSESSMENT Pt to daily MJ use prior to positive UPT at 8 weeks. Pt explained that her mother passed away in 07/20/22, which caused a lot of depression and insomnia. Pt told Sw that the MJ helped her calm down and sleep. Pt continued to smoke MJ for about 2 weeks after she learned of pregnancy before stopping. She denies any use since April '12 or use of other illegal substances. She denies any Etoh abuse, as she states she was never a big drinker. She denies any depression symptoms now. Sw explained  hospital drug testing policy. Pt verbalized understanding. FOB is at the bedside and supportive. She has all the supplies she needs for the infant. Sw will follow up with drug screen results and make a report if needed.  V. SOCIAL WORK PLAN _X__No Further Intervention Required/No Barriers to Discharge  ___Psychosocial Support and Ongoing Assessment of Needs  ___Patient/Family Education:  ___Child Protective Services Report County___________ Date___/____/____  ___Information/Referral to [employer]_________________________  ___Other:

## 2011-03-18 NOTE — Anesthesia Postprocedure Evaluation (Signed)
  Anesthesia Post-op Note  Patient: Leah Baker  Procedure(s) Performed:  CESAREAN SECTION  Patient Location: Mother/Baby  Anesthesia Type: Spinal  Level of Consciousness: awake, alert  and oriented  Airway and Oxygen Therapy: Patient Spontanous Breathing  Post-op Pain: mild  Post-op Assessment: Post-op Vital signs reviewed, Respiratory Function Stable, Patent Airway, No signs of Nausea or vomiting and Adequate PO intake  Post-op Vital Signs: stable  Complications: No apparent anesthesia complications

## 2011-03-18 NOTE — Progress Notes (Signed)
UR chart review completed.  

## 2011-03-19 MED ORDER — POLYETHYLENE GLYCOL 3350 17 G PO PACK
17.0000 g | PACK | Freq: Two times a day (BID) | ORAL | Status: DC
  Administered 2011-03-19: 17 g via ORAL
  Filled 2011-03-19 (×5): qty 1

## 2011-03-19 NOTE — Progress Notes (Signed)
Subjective: Postpartum Day2: Cesarean Delivery Patient reports + flatus and no problems voiding.  Still with no BM   Objective: Vital signs in last 24 hours: Temp:  [98.4 F (36.9 C)-98.5 F (36.9 C)] 98.5 F (36.9 C) (10/17 0557) Pulse Rate:  [87-98] 87  (10/17 0557) Resp:  [18-20] 18  (10/17 0557) BP: (103-113)/(68-79) 113/79 mmHg (10/17 0557) SpO2:  [95 %-98 %] 95 % (10/16 1300)  Physical Exam:  General: alert and cooperative Lochia: appropriate Uterine Fundus: firm Incision: healing well DVT Evaluation: No evidence of DVT seen on physical exam.   Basename 03/18/11 0510  HGB 9.8*  HCT 30.2*    Assessment/Plan: Status post Cesarean section. Doing well postoperatively.  Continue current care. Possible PM D/C vs D/C in am.  Miralax for BM  Leah Baker 03/19/2011, 8:55 AM

## 2011-03-20 MED ORDER — IBUPROFEN 600 MG PO TABS: 600.0000 mg | ORAL_TABLET | Freq: Four times a day (QID) | ORAL | Status: AC | PRN

## 2011-03-20 MED ORDER — FERROUS SULFATE 325 (65 FE) MG PO TABS: 325.0000 mg | ORAL_TABLET | Freq: Two times a day (BID) | ORAL | Status: DC

## 2011-03-20 NOTE — Discharge Summary (Signed)
Obstetric Discharge Summary Reason for Admission: cesarean section Prenatal Procedures: none Intrapartum Procedures: cesarean: low cervical, transverse Postpartum Procedures: P.P. tubal ligation Complications-Operative and Postpartum: none Hemoglobin  Date Value Range Status  03/18/2011 9.8* 12.0-15.0 (g/dL) Final     HCT  Date Value Range Status  03/18/2011 30.2* 36.0-46.0 (%) Final   Physical Exam:  General: alert and cooperative  Lochia: appropriate  Uterine Fundus: firm  Incision: healing well  DVT Evaluation: No evidence of DVT seen on physical exam.    Discharge Diagnoses: Term Pregnancy-delivered  Discharge Information: Date: 03/20/2011 Activity: pelvic rest Diet: routine Medications: PNV, Ibuprofen, Iron and Vicodin Condition: stable Instructions: refer to practice specific booklet Discharge to: home   Newborn Data: Live born female  Birth Weight: 7 lb 8.6 oz (3420 g) APGAR: ,   Home with mother.  Sharol Croghan 03/20/2011, 8:27 AM

## 2011-03-21 ENCOUNTER — Other Ambulatory Visit: Payer: Self-pay | Admitting: Family Medicine

## 2011-03-21 MED ORDER — HYDROCODONE-ACETAMINOPHEN 10-500 MG PO TABS: 1.0000 | ORAL_TABLET | Freq: Four times a day (QID) | ORAL | Status: AC | PRN

## 2011-03-24 ENCOUNTER — Encounter (HOSPITAL_COMMUNITY): Payer: Self-pay | Admitting: Obstetrics & Gynecology

## 2011-04-16 ENCOUNTER — Encounter: Payer: Self-pay | Admitting: Family Medicine

## 2011-04-16 ENCOUNTER — Ambulatory Visit (INDEPENDENT_AMBULATORY_CARE_PROVIDER_SITE_OTHER): Payer: Medicaid Other | Admitting: Family Medicine

## 2011-04-16 VITALS — BP 155/97 | HR 69 | Temp 98.1°F | Ht 63.0 in | Wt 175.0 lb

## 2011-04-16 DIAGNOSIS — K089 Disorder of teeth and supporting structures, unspecified: Secondary | ICD-10-CM

## 2011-04-16 DIAGNOSIS — K0889 Other specified disorders of teeth and supporting structures: Secondary | ICD-10-CM | POA: Insufficient documentation

## 2011-04-16 MED ORDER — HYDROCODONE-ACETAMINOPHEN 10-300 MG PO TABS: 1.0000 | ORAL_TABLET | Freq: Three times a day (TID) | ORAL | Status: DC | PRN

## 2011-04-16 NOTE — Patient Instructions (Signed)
It was good to see today I'm sorry that your teeth are hurting. For pain take ibuprofen scheduled 100 mg every 6-8 hours. Take Vicodin for breakthrough pain every 8-12 hours Be sure to followup with a dentist Call if any questions God bless and Happy Thanksgiving Doree Albee MD

## 2011-04-16 NOTE — Progress Notes (Signed)
  Subjective:    Patient ID: Leah Baker, female    [name]: [birth year], 30 y.o.   MRN: [id number]  HPI Pt recently had dental procedure for dental abscess removal that was present through pregnancy. Pt states that a total of 7 teeth were removed during this procedure. Pt states that she has had severe pain since procedure. Pt states that she attempted to set up appt with orthodontic surgeon, however, pt was referred to PCP.   Today, pt states that she has had severe pain since procedure. Pain has been poorly controlled with vicodin 5/500 (2 tabs QID) as well as high ibuprofen. Pt states that she has an appt with Dentist on the 19th.   Review of Systems See HPI     Objective:   Physical Exam Gen: in bed, in mod distress 2/2 pain  HEENT: multiple missing teeth with peripheral erythema   Assessment & Plan:

## 2011-04-16 NOTE — Assessment & Plan Note (Signed)
Will place pt on short course of vicodin 10/300 and scheduled ibuprofen pending dental appt. Discussed infectious red flags for return.

## 2011-05-08 ENCOUNTER — Ambulatory Visit: Payer: Medicaid Other | Admitting: Family Medicine

## 2011-05-14 ENCOUNTER — Ambulatory Visit (INDEPENDENT_AMBULATORY_CARE_PROVIDER_SITE_OTHER): Payer: Medicaid Other | Admitting: Family Medicine

## 2011-05-14 ENCOUNTER — Encounter: Payer: Self-pay | Admitting: Family Medicine

## 2011-05-14 VITALS — BP 129/88 | HR 75 | Ht 63.0 in | Wt 175.0 lb

## 2011-05-14 DIAGNOSIS — K0889 Other specified disorders of teeth and supporting structures: Secondary | ICD-10-CM

## 2011-05-14 DIAGNOSIS — K089 Disorder of teeth and supporting structures, unspecified: Secondary | ICD-10-CM

## 2011-05-14 DIAGNOSIS — F329 Major depressive disorder, single episode, unspecified: Secondary | ICD-10-CM

## 2011-05-14 MED ORDER — FLUOXETINE HCL 20 MG PO CAPS: 20.0000 mg | ORAL_CAPSULE | Freq: Every day | ORAL | Status: DC

## 2011-05-14 NOTE — Assessment & Plan Note (Addendum)
Plan to start pt on prozac for mood (EPDS score 14). I suspect that pt may have had some mood issues prior to pregnancy given hx/o ETOH and MJ abuse in the past. Another contributing factor is that pt's mother died at beginning of pregnancy. Psych red flags discussed. Handout given. Will follow up 2-4 weeks to reassess.

## 2011-05-14 NOTE — Assessment & Plan Note (Signed)
Otherwise normal postpartum exam s/p c-section. Given subjective complaints of mood lability as well as EPDS score 14 will start pt on prozac. Plan to follow up in 2-4 weeks to reassess sxs. Psych red flags discussed.

## 2011-05-14 NOTE — Assessment & Plan Note (Signed)
Improved s/p extraction.

## 2011-05-14 NOTE — Progress Notes (Signed)
S:  Pt is here for a postpartum visit. Pt is noted to have a viable female  infant born via c-section. Delivery date was 03/17/11. 1.Feeding:breast and bottle; mainly bottle  2.Vaginal bleeding:bleeding has stopped  3.Stress/Mood:Pt states that she has been moody.  4.Contraception:BTL 5.Overall recovery:pt feels that recovery has been overall well  6.Home Support:Good overall support system with father and older daughter  77.Issues or concerns:no  8.Sexual Activity: Yes; no issues 9.EPDS Score: 14  Dental Pain/Abscess: Much improved s/p surgery. Pt states that she still has some teeth that need to be removed in the near future. Pain is overall controlled currently.   O:  Current outpatient prescriptions:bisacodyl (BISACODYL) 5 MG EC tablet, Take 5-10 mg by mouth daily as needed. bm, Disp: , Rfl: ;  ferrous sulfate 325 (65 FE) MG tablet, Take 1 tablet (325 mg total) by mouth 2 (two) times daily with a meal., Disp: 60 tablet, Rfl: 3;  Hydrocodone-Acetaminophen 10-300 MG TABS, Take 1 tablet by mouth every 8 (eight) hours as needed., Disp: 15 each, Rfl: 0 polyethylene glycol (MIRALAX / GLYCOLAX) packet, Take 17 g by mouth daily.  , Disp: , Rfl:   Wt Readings from Last 3 Encounters:  05/14/11 175 lb (79.379 kg)  04/16/11 175 lb (79.379 kg)  03/17/11 186 lb (84.369 kg)   Temp Readings from Last 3 Encounters:  04/16/11 98.1 F (36.7 C) Oral  03/20/11 98.5 F (36.9 C) Oral  03/20/11 98.5 F (36.9 C) Oral   BP Readings from Last 3 Encounters:  05/14/11 129/88  04/16/11 155/97  03/20/11 108/74   Pulse Readings from Last 3 Encounters:  05/14/11 75  04/16/11 69  03/20/11 87    General: alert and cooperative HEENT: PERRLA and extra ocular movement intact; mild dental caries.  Heart: S1, S2 normal, no murmur, rub or gallop, regular rate and rhythm Lungs: clear to auscultation, no wheezes or rales and unlabored breathing Abdomen: abdomen is soft without significant tenderness, masses,  organomegaly or guarding; + lower abdominal transverse scar s/p c-section; well healed.  Extremities: extremities normal, atraumatic, no cyanosis or edema Skin:no rashes  A/P:

## 2011-05-14 NOTE — Patient Instructions (Signed)
Postpartum Depression After delivery, your body is going through a drastic change in hormone levels. You may find yourself crying for no apparent reason and unable to cope with all the changes a new baby brings. This is a common response following a pregnancy. Seek support from your partner and/or friends and just give yourself time to recover. If these feelings persist and you feel you are getting worse, contact your caregiver or other professionals who can help you. WHAT IS DEPRESSION? Depression can be described as feeling sad, blue, unhappy, miserable, or down in the dumps. Most of us feel this way at one time or another for short periods. But true clinical depression is a mood disorder in which feelings of sadness, loss, anger, fear, or frustration interfere with everyday life for an extended time. Depression can be mild, moderate, or severe. The degree of depression, which your caregiver can determine, influences your treatment. Postpartum depression occurs within a couple days to months after delivering your baby. HOW COMMON IS DEPRESSION DURING AND AFTER PREGNANCY? Depression that occurs during pregnancy or within a year after delivery is called perinatal depression. Depression after pregnancy is also called postpartum depression or peripartum depression. The exact number of women with depression during this time is unknown, but it occurs in between 10-15% of women. Researchers believe that depression is one of the most common complications during and after pregnancy. The depression is often not recognized or treated, because some normal pregnancy changes cause similar symptoms and are happening at the same time. Tiredness, problems sleeping, stronger emotional reactions, and changes in body weight may occur during and after pregnancy. But these symptoms may also be signs of depression.  CAUSES  Rapid hormone changes. Estrogen and progesterone usually decrease immediately after delivering your  baby. Researchers think the fast change in hormone levels may lead to depression, just as smaller changes in hormones can affect a woman's moods before she gets her menstrual period.   Decrease in thyroid hormone. Thyroid hormone regulates how your body uses and stores energy from food (metabolism). A simple blood test can tell if this condition is causing a woman's depression. If so, thyroid medicine can be prescribed by your caregiver.   A stressful life event, such as a death in the family. This can cause chemical changes in the brain that lead to depression.   Feeling overwhelmed by caring for and raising a new baby.   Depression is also an illness that runs in some families. It is not always clear what causes depression.  FACTORS THAT MAY INCREASE A WOMAN'S CHANCE OF DEPRESSION DURING PREGNANCY:  History of depression.   Substance abuse, alcohol, or drugs.   Little support from family and friends.   Problems with previous pregnancy or birth.   Young age for motherhood.   Living alone.   Little or no social support.   Family history of mental illness.   Anxiety about the fetus.   Marital or financial problems.   Postpartum depression in a previous pregnancy.   Having a psychiatric illness (schizophrenia, bipolar disorder).   Going through a difficult or stressful pregnancy.   Going through a difficult labor and delivery.   Moving to another city or state during your pregnancy, or just after delivering your baby.  OTHER FACTORS THAT MAY CONTRIBUTE TO POSTPARTUM DEPRESSION INCLUDE:   Feeling tired after delivery, broken sleep patterns, and not getting enough rest. This often keeps a new mother from regaining her full strength for weeks.   Feeling   overwhelmed with a new baby to take care of and doubting your ability to be a good mother.   Feeling stress from changes in work and home routines. Women sometimes think they need to be "super mom" or perfect. This is not  realistic and can add stress.   Having feelings of loss. This can include loss of the identity of who you are, or were, before having the baby, loss of control, loss of your pre-pregnancy figure, and feeling less attractive.   Having less free time and less control over your time. Needing to stay home, indoors, for longer periods of time and having less time to spend with your partner and loved ones can contribute to depression.   Having trouble doing your daily activities at home or at work.   Fears about not knowing how to take of the baby correctly and about harming the baby.   Feelings of guilt that you are not taking care of the baby properly.  SYMPTOMS Any of these symptoms, during and after pregnancy, that last longer than 2 weeks are signs of depression:  Feeling restless or irritable.   Feeling sad, hopeless, and overwhelmed.   Crying a lot.   Having no energy or motivation.   Eating too little or too much.   Sleeping too little or too much.   Trouble focusing, remembering, or making decisions.   Feeling worthless and guilty.   Loss of interest or pleasure in activities.   Withdrawal from friends and family.   Having headaches, chest pains, rapid or irregular heartbeat (palpitations), or fast and shallow breathing (hyperventilation).   After pregnancy, being afraid of hurting the baby or oneself, and not having any interest in the baby.   Not being able to care for yourself or the baby.   Loss of interest in caring for the baby.   Anxiety and panic attacks.   Thoughts of harming yourself, the baby, or someone else.   Feelings of guilt because you feel you are not taking care of the baby well enough.  WHAT IS THE DIFFERENCE BETWEEN "BABY BLUES," POSTPARTUM DEPRESSION, AND POSTPARTUM PSYCHOSIS?  The "baby blues" occurs 70 to 80% of the time, and it can happen in the days right after childbirth. It normally goes away within a few days to a week. A new mother can  have sudden mood swings, sadness, crying spells, loss of appetite, sleeping problems, and feel irritable, restless, anxious, and lonely. Symptoms are not severe and treatment usually is not needed. But there are things you can do to feel better. Nap when the baby does. Ask for help from your spouse, family members, and friends. Join a support group of new moms or talk with other moms. If the "baby blues" does not go away in a week to 10 days or gets worse, you may have postpartum depression.   Postpartum depression can happen anytime within the first year after childbirth. A woman may have a number of symptoms, such as sadness, lack of energy, trouble concentrating, anxiety, and feelings of guilt and worthlessness. The difference between postpartum depression and the "baby blues" is that the feelings in postpartum depression are much stronger and often affects a woman's well-being. It keeps her from functioning well for a longer period of time. Postpartum depression needs to be treated by a caregiver. Counseling, support groups, and medicines can help.   Postpartum psychosis is rare. It occurs in 1 or 2 out of every 1000 births. It usually begins in   the first 6 weeks after delivery. Women who have bipolar disorder, schizoaffective disorder, or family history of psychotic disease have a higher risk for developing postpartum psychosis. Symptoms may include delusions, hallucinations, sleep disturbances, and obsessive thoughts about the baby. A woman may have rapid mood swings, from depression, to irritability, to euphoria. This is a serious condition and needs professional care and treatment.  WHAT STEPS CAN I TAKE IF I HAVE SYMPTOMS OF DEPRESSION DURING PREGNANCY OR AFTER CHILDBIRTH?  Some women do not tell anyone about their symptoms, because they feel embarrassed, ashamed, or guilty about feeling depressed when they are supposed to be happy. They worry that they will be viewed as unfit parents. Perinatal  depression can happen to any woman. It does not mean you are a bad or a "not together" mom. You and your baby do not need to suffer. There is help. You should discuss these feelings with your spouse or partner, family, and caregiver.   There are different types of individual and group "talk therapies" that can help a woman with perinatal depression feel better and do better as a mom and as a person. Limited research suggests that many women with perinatal depression improve when treated with antidepressant medicine. Your caregiver can help you learn more about these options and decide which approach is best for you and your baby.   Speak to your caregiver if you are having symptoms of depression while you are pregnant or after you deliver your baby. Your caregiver can give you a questionnaire to test for depression. You can also be referred to a mental health professional who specializes in treating depression.  HOME CARE INSTRUCTIONS  Try to get as much rest as you can. Try to nap when the baby naps.   Stop putting pressure on yourself to do everything. Do as much as you can and leave the rest.   Ask for help with household chores and nighttime feedings. Ask your partner to bring the baby to you so you can breastfeed. If you can, have a friend, family member, or professional support person help you in the home for part of the day.   Talk to your partner, family, and friends about how you are feeling.   Do not spend a lot of time alone. Get dressed and leave the house. Run an errand or take a short walk.   Spend time alone with your partner.   Talk with other mothers so you can learn from their experiences.   Join a support group for women with depression. Call a local hotline or look in your telephone book for information and services.   Do not make any major life changes during pregnancy. Major changes can cause unneeded stress. However, sometimes big changes cannot be avoided. Arrange  support and help in your new situation ahead of time.   Exercise regularly.   Eat a balanced and nourishing diet.   Seek help if there are marital or financial problems.   Take the medicine your caregiver gives, as directed.   Keep all your postpartum appointments.  TREATMENT There are 2 common types of treatment for depression.  Talk therapy. This involves talking to a therapist, psychologist, clergyperson, or social worker, in order to learn to change how depression makes you think, feel, and act.   Medicine. Your caregiver can give you an antidepressant medicine to help you. These medicines can help relieve the symptoms of depression.   Women who are pregnant or breast-feeding should talk with   their caregivers about the advantages and risks of taking antidepressant medicines. Some women are concerned that taking these medicines may harm the baby. A mother's depression can affect her baby's development. Getting treatment is important for both mother and baby. The risks of taking medicine must be weighed against the risks of depression. It is a decision that women need to discuss carefully with their caregivers. Women who decide to take antidepressant medicines should talk to their caregivers about which antidepressant medicines are safer to take while pregnant or breastfeeding.  What effects can untreated depression have?  Depression not only hurts the mother, but it also affects her family. Some researchers have found that depression during pregnancy can raise the risk of delivering an underweight baby or a premature infant. Some women with depression have difficulty caring for themselves during pregnancy. They may have trouble eating and do not gain enough weight during the pregnancy. They may also have trouble sleeping, may miss prenatal visits, may not follow medical instructions, have a poor diet, or may use harmful substances, like tobacco, alcohol, or illegal drugs.   Postpartum  depression can affect a mother's ability to parent. She may lack energy, have trouble concentrating, be irritable, and not be able to meet her child's needs for love and affection. As a result, she may feel guilty and lose confidence in herself as a mother. This can make the depression worse. Researchers believe that postpartum depression can affect the infant by causing delays in language development, problems with emotional bonding to others, behavioral problems, lower activity levels, sleep problems, and distress. It helps if the father or another caregiver can assist in meeting the needs of the baby, and other children in the family, while the mother is depressed.   All children deserve the chance to have a healthy mom. All moms deserve the chance to enjoy their life and their children. Do not suffer alone. If you are experiencing symptoms of depression during pregnancy or after having a baby, tell a loved one and call your caregiver right away.  SEEK MEDICAL CARE IF:  You think you have postpartum depression.   You want medicine to treat your postpartum depression.   You want a referral to a psychiatrist or psychologist.   You are having a reaction or problems with your medicine.  SEEK IMMEDIATE MEDICAL CARE IF:  You have suicidal feelings.   You feel you may harm the baby.   You feel you may harm your spouse/partner, or someone else.   You feel you need to be admitted to a hospital now.   You feel you are losing control and need treatment immediately.  FOR MORE INFORMATION National Women's Health Information Center: www.womenshealth.gov National Institute of Mental Health, NIH, HHS: www.nimh.nih.gov American Psychological Association: www.apa.org  Postpartum Education for Parents: www.sbpep.org National Mental Health Information Center, SAMHSA, HHS: www.mentalhealth.org  National Mental Health Association: www.nmha.org Postpartum Support International: www.postpartum.net    Document Released: 02/21/2004 Document Revised: 01/29/2011 Document Reviewed: 05/31/2009 [employer] Patient Information 2012 [employer]. 

## 2011-06-14 ENCOUNTER — Emergency Department (HOSPITAL_COMMUNITY): Payer: No Typology Code available for payment source

## 2011-06-14 ENCOUNTER — Emergency Department (HOSPITAL_COMMUNITY)
Admission: EM | Admit: 2011-06-14 | Discharge: 2011-06-14 | Disposition: A | Discharge status: Home / Self Care | Payer: No Typology Code available for payment source | Attending: Emergency Medicine | Admitting: Emergency Medicine

## 2011-06-14 ENCOUNTER — Encounter (HOSPITAL_COMMUNITY): Payer: Self-pay

## 2011-06-14 DIAGNOSIS — IMO0002 Reserved for concepts with insufficient information to code with codable children: Secondary | ICD-10-CM | POA: Insufficient documentation

## 2011-06-14 DIAGNOSIS — M1710 Unilateral primary osteoarthritis, unspecified knee: Secondary | ICD-10-CM | POA: Insufficient documentation

## 2011-06-14 DIAGNOSIS — M25569 Pain in unspecified knee: Secondary | ICD-10-CM | POA: Insufficient documentation

## 2011-06-14 MED ORDER — DIAZEPAM 5 MG PO TABS: 5.0000 mg | ORAL_TABLET | Freq: Two times a day (BID) | ORAL | Status: DC

## 2011-06-14 MED ORDER — IBUPROFEN 800 MG PO TABS: 800.0000 mg | ORAL_TABLET | Freq: Three times a day (TID) | ORAL | Status: DC

## 2011-06-14 MED ORDER — HYDROCODONE-ACETAMINOPHEN 5-325 MG PO TABS: 1.0000 | ORAL_TABLET | ORAL | Status: DC | PRN
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
## 2011-06-14 NOTE — ED Notes (Signed)
[employer] called  
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
## 2011-06-15 NOTE — ED Provider Notes (Signed)
Medical screening examination/treatment/procedure(s) were performed by non-physician practitioner and as supervising physician I was immediately available for consultation/collaboration.   Riham Polyakov E Dazja Houchin, MD 06/15/11 0728 

## 2011-06-16 ENCOUNTER — Ambulatory Visit
Admission: RE | Admit: 2011-06-16 | Discharge: 2011-06-16 | Disposition: A | Discharge status: Home / Self Care | Payer: No Typology Code available for payment source | Source: Ambulatory Visit | Attending: Family Medicine | Admitting: Family Medicine

## 2011-06-16 ENCOUNTER — Ambulatory Visit (INDEPENDENT_AMBULATORY_CARE_PROVIDER_SITE_OTHER): Payer: No Typology Code available for payment source | Admitting: Family Medicine

## 2011-06-16 ENCOUNTER — Encounter: Payer: Self-pay | Admitting: Family Medicine

## 2011-06-16 VITALS — BP 115/81 | HR 71 | Temp 97.6°F | Ht 63.0 in | Wt 177.9 lb

## 2011-06-16 DIAGNOSIS — M25539 Pain in unspecified wrist: Secondary | ICD-10-CM

## 2011-06-16 DIAGNOSIS — M25531 Pain in right wrist: Secondary | ICD-10-CM

## 2011-06-16 MED ORDER — MELOXICAM 15 MG PO TABS: 15.0000 mg | ORAL_TABLET | Freq: Every day | ORAL | Status: DC
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
## 2011-06-20 ENCOUNTER — Ambulatory Visit (INDEPENDENT_AMBULATORY_CARE_PROVIDER_SITE_OTHER): Payer: Self-pay | Admitting: Family Medicine

## 2011-06-20 ENCOUNTER — Encounter: Payer: Self-pay | Admitting: Family Medicine

## 2011-06-20 VITALS — BP 134/82 | HR 86 | Ht 63.0 in | Wt 179.4 lb

## 2011-06-20 DIAGNOSIS — M25531 Pain in right wrist: Secondary | ICD-10-CM

## 2011-06-20 DIAGNOSIS — M25539 Pain in unspecified wrist: Secondary | ICD-10-CM

## 2011-06-20 DIAGNOSIS — M25519 Pain in unspecified shoulder: Secondary | ICD-10-CM

## 2011-06-20 DIAGNOSIS — M25511 Pain in right shoulder: Secondary | ICD-10-CM

## 2011-06-20 MED ORDER — TRAMADOL HCL 50 MG PO TABS: 50.0000 mg | ORAL_TABLET | Freq: Three times a day (TID) | ORAL | Status: AC | PRN
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
## 2011-06-20 NOTE — Assessment & Plan Note (Signed)
X-rays negative. Her differential is right wrist sprain, de Quervain's tenosynovitis, occult scaphoid fracture. Plan to continue right wrist splint for another week.  Will work on removing splint and moving wrist around over the next week. If pain still present continued use of splint. If been present at ten-day followup we'll rex-ray hand to confirm no scaphoid fracture.  She expresses understanding.

## 2011-06-20 NOTE — Assessment & Plan Note (Signed)
I suspect she has a incomplete rotator cuff injury of the supraspinatus muscle.   Plan for range of motion exercises and referral to physical therapy.  Additionally plan for tramadol as needed for pain in addition to meloxicam.  We'll followup in 10 days or sooner if needed. Discussed that this injury will take some time to heal and will heal much better if continued arm range of motion is preserved. She expresses understanding. Handout provided.

## 2011-06-24 ENCOUNTER — Telehealth: Payer: Self-pay | Admitting: Family Medicine

## 2011-06-24 NOTE — Telephone Encounter (Signed)
Need to have a note stating restrictions to duties at work due to shoulder pain.  Call when ready to pickup.

## 2011-06-25 NOTE — Telephone Encounter (Signed)
Letter is at the front desk

## 2011-06-25 NOTE — Telephone Encounter (Signed)
Ms. Greenley is calling about this letter to see if it is ready.  She says that it also needs to say that she was seen in the office on Friday.  Please call her when the letter is ready.

## 2011-06-30 ENCOUNTER — Ambulatory Visit (INDEPENDENT_AMBULATORY_CARE_PROVIDER_SITE_OTHER): Payer: No Typology Code available for payment source | Admitting: Family Medicine

## 2011-06-30 VITALS — BP 116/81 | HR 80 | Ht 63.0 in | Wt 178.0 lb

## 2011-06-30 DIAGNOSIS — M25511 Pain in right shoulder: Secondary | ICD-10-CM

## 2011-06-30 DIAGNOSIS — M25539 Pain in unspecified wrist: Secondary | ICD-10-CM

## 2011-06-30 DIAGNOSIS — M25531 Pain in right wrist: Secondary | ICD-10-CM

## 2011-06-30 DIAGNOSIS — M25519 Pain in unspecified shoulder: Secondary | ICD-10-CM

## 2011-06-30 NOTE — Patient Instructions (Signed)
Thank you for coming in today. Continue wearing the brace.  Get the xray today.  If the xray is normal stop the brace and start doing WRIST PT as well.  If there is a fracture we will send you to orthopedics.  See me or Dr. Alvester Morin in 4 weeks.  I will call you with the xray results.

## 2011-06-30 NOTE — Assessment & Plan Note (Signed)
Continued pain however she will be starting physical therapy in 2 days. Plan to followup in 4 weeks.

## 2011-06-30 NOTE — Assessment & Plan Note (Signed)
I am concerned for occult scaphoid fracture given the location and duration of her pain. Plan to repeat x-ray today to evaluate. If negative will encourage her to physical therapy for her right wrist.
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
## 2011-07-02 ENCOUNTER — Telehealth: Payer: Self-pay | Admitting: Family Medicine

## 2011-07-02 ENCOUNTER — Ambulatory Visit
Payer: No Typology Code available for payment source | Attending: Family Medicine | Admitting: Rehabilitative and Restorative Service Providers"

## 2011-07-02 ENCOUNTER — Ambulatory Visit
Admission: RE | Admit: 2011-07-02 | Discharge: 2011-07-02 | Disposition: A | Discharge status: Home / Self Care | Payer: No Typology Code available for payment source | Source: Ambulatory Visit | Attending: Family Medicine | Admitting: Family Medicine

## 2011-07-02 DIAGNOSIS — IMO0001 Reserved for inherently not codable concepts without codable children: Secondary | ICD-10-CM | POA: Insufficient documentation

## 2011-07-02 DIAGNOSIS — M25519 Pain in unspecified shoulder: Secondary | ICD-10-CM | POA: Insufficient documentation

## 2011-07-02 DIAGNOSIS — M25531 Pain in right wrist: Secondary | ICD-10-CM

## 2011-07-02 DIAGNOSIS — M6281 Muscle weakness (generalized): Secondary | ICD-10-CM | POA: Insufficient documentation

## 2011-07-02 NOTE — Telephone Encounter (Signed)
Message copied by Rodolph Bong on Wed Jul 02, 2011  3:46 PM ------      Message from: Barbaraann Barthel      Created: Wed Jul 02, 2011 11:13 AM                   ----- Message -----         From: Rad Results In Interface         Sent: 07/02/2011  10:45 AM           To: Barbaraann Barthel, MD

## 2011-07-02 NOTE — Telephone Encounter (Signed)
Called about Wrist Xray results.  Encourage no use of splint and incorporation of Wrist PT into PT exercises. Left a message.

## 2011-07-03 ENCOUNTER — Encounter: Payer: No Typology Code available for payment source | Admitting: Rehabilitative and Restorative Service Providers"

## 2011-07-07 ENCOUNTER — Ambulatory Visit
Payer: No Typology Code available for payment source | Attending: Family Medicine | Admitting: Rehabilitative and Restorative Service Providers"

## 2011-07-07 DIAGNOSIS — M25519 Pain in unspecified shoulder: Secondary | ICD-10-CM | POA: Insufficient documentation

## 2011-07-07 DIAGNOSIS — M6281 Muscle weakness (generalized): Secondary | ICD-10-CM | POA: Insufficient documentation

## 2011-07-07 DIAGNOSIS — IMO0001 Reserved for inherently not codable concepts without codable children: Secondary | ICD-10-CM | POA: Insufficient documentation

## 2011-07-09 ENCOUNTER — Ambulatory Visit: Payer: No Typology Code available for payment source | Admitting: Rehabilitative and Restorative Service Providers"

## 2011-07-10 ENCOUNTER — Ambulatory Visit: Payer: No Typology Code available for payment source | Admitting: Rehabilitative and Restorative Service Providers"

## 2011-07-14 ENCOUNTER — Ambulatory Visit: Payer: No Typology Code available for payment source | Admitting: Rehabilitative and Restorative Service Providers"

## 2011-07-15 ENCOUNTER — Encounter: Payer: No Typology Code available for payment source | Admitting: Rehabilitative and Restorative Service Providers"

## 2011-07-15 ENCOUNTER — Telehealth: Payer: Self-pay | Admitting: *Deleted

## 2011-07-15 NOTE — Telephone Encounter (Signed)
Received call from Thornell Sartorius, PT at [facility].  Patient has been seen there for 2 weeks due to right shoulder pain.  Has faxed order/evaluation to our office requesting approval for patient to be able to receive iontopheresis with dexamethasone. Thinks this will help with her shoulder pain.  Patient has appt tomorrow morning.  Can fax order to [phone] or call with verbal order to [phone].  Will send note to Dr. Alvester Morin.  Gaylene Brooks, RN

## 2011-07-15 NOTE — Telephone Encounter (Signed)
Please address .Leah Baker  

## 2011-07-16 ENCOUNTER — Ambulatory Visit: Payer: No Typology Code available for payment source | Admitting: Rehabilitative and Restorative Service Providers"

## 2011-07-17 ENCOUNTER — Encounter: Payer: Self-pay | Admitting: Rehabilitative and Restorative Service Providers"

## 2011-07-21 NOTE — Telephone Encounter (Signed)
Verbal order attempted but office was closed today. Will fax order.Informed pt via voice mail that Order will be faxed in.

## 2011-07-22 ENCOUNTER — Ambulatory Visit: Payer: No Typology Code available for payment source | Admitting: Rehabilitative and Restorative Service Providers"

## 2011-07-23 ENCOUNTER — Ambulatory Visit: Payer: No Typology Code available for payment source | Admitting: Rehabilitative and Restorative Service Providers"

## 2011-07-24 ENCOUNTER — Ambulatory Visit: Payer: No Typology Code available for payment source | Admitting: Rehabilitative and Restorative Service Providers"

## 2011-07-29 ENCOUNTER — Ambulatory Visit: Payer: No Typology Code available for payment source | Admitting: Rehabilitative and Restorative Service Providers"

## 2011-07-31 ENCOUNTER — Ambulatory Visit: Payer: No Typology Code available for payment source | Admitting: Rehabilitative and Restorative Service Providers"

## 2011-08-04 ENCOUNTER — Ambulatory Visit
Payer: No Typology Code available for payment source | Attending: Family Medicine | Admitting: Rehabilitative and Restorative Service Providers"

## 2011-08-04 DIAGNOSIS — M25519 Pain in unspecified shoulder: Secondary | ICD-10-CM | POA: Insufficient documentation

## 2011-08-04 DIAGNOSIS — M6281 Muscle weakness (generalized): Secondary | ICD-10-CM | POA: Insufficient documentation

## 2011-08-04 DIAGNOSIS — IMO0001 Reserved for inherently not codable concepts without codable children: Secondary | ICD-10-CM | POA: Insufficient documentation

## 2011-08-06 ENCOUNTER — Ambulatory Visit: Payer: No Typology Code available for payment source | Admitting: Rehabilitative and Restorative Service Providers"

## 2011-08-07 ENCOUNTER — Encounter: Payer: No Typology Code available for payment source | Admitting: Rehabilitative and Restorative Service Providers"

## 2011-08-08 ENCOUNTER — Encounter: Payer: Self-pay | Admitting: Family Medicine

## 2011-08-08 ENCOUNTER — Ambulatory Visit (INDEPENDENT_AMBULATORY_CARE_PROVIDER_SITE_OTHER): Payer: No Typology Code available for payment source | Admitting: Family Medicine

## 2011-08-08 VITALS — BP 108/70 | HR 89 | Temp 98.0°F | Ht 63.0 in | Wt 179.0 lb

## 2011-08-08 DIAGNOSIS — M25539 Pain in unspecified wrist: Secondary | ICD-10-CM

## 2011-08-08 DIAGNOSIS — M25519 Pain in unspecified shoulder: Secondary | ICD-10-CM

## 2011-08-08 DIAGNOSIS — M25511 Pain in right shoulder: Secondary | ICD-10-CM

## 2011-08-08 DIAGNOSIS — M25531 Pain in right wrist: Secondary | ICD-10-CM

## 2011-08-08 NOTE — Patient Instructions (Signed)
It was good to see you today  I would like to get an xray of your shoulder  I am referring you to sports medicine for your shoulder and wrist pain You can take tramadol and tylenol combined for your pain Follow up with me in 2-3 months, Call if any questions,  God Bless, Doree Albee MD

## 2011-08-12 ENCOUNTER — Ambulatory Visit: Payer: No Typology Code available for payment source | Admitting: Rehabilitative and Restorative Service Providers"
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
## 2011-08-13 ENCOUNTER — Ambulatory Visit: Payer: No Typology Code available for payment source | Admitting: Rehabilitative and Restorative Service Providers"

## 2011-08-14 ENCOUNTER — Telehealth: Payer: Self-pay | Admitting: Family Medicine

## 2011-08-14 NOTE — Assessment & Plan Note (Signed)
Will refer to [facility] for this. Will obtain shoulder xrays as these have not been obtained to date. Will continue tramadol and tylenol for pain.
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
## 2011-08-14 NOTE — Assessment & Plan Note (Signed)
Referral to [facility]. Tylenol and tramadol for pain relief. Given nonspecificity of wrist ain, ?if pt may benefit from wrist U/S.

## 2011-08-14 NOTE — Telephone Encounter (Signed)
Returned call to patient.  Is having PT 3-4 days/week.  Has been having increased right shoulder pain (8/10).  Took 2 Tramadol tabs with Tylenol and still no pain relief.  Work-in appt scheduled for tomorrow with Dr. Edmonia James at 8:45 am.  Gaylene Brooks, RN

## 2011-08-15 ENCOUNTER — Encounter: Payer: Self-pay | Admitting: Family Medicine

## 2011-08-15 ENCOUNTER — Ambulatory Visit (INDEPENDENT_AMBULATORY_CARE_PROVIDER_SITE_OTHER): Payer: No Typology Code available for payment source | Admitting: Family Medicine

## 2011-08-15 VITALS — BP 119/83 | HR 98 | Ht 63.0 in | Wt 179.3 lb

## 2011-08-15 DIAGNOSIS — M25519 Pain in unspecified shoulder: Secondary | ICD-10-CM

## 2011-08-15 DIAGNOSIS — M25539 Pain in unspecified wrist: Secondary | ICD-10-CM

## 2011-08-15 DIAGNOSIS — M25531 Pain in right wrist: Secondary | ICD-10-CM

## 2011-08-15 DIAGNOSIS — M25511 Pain in right shoulder: Secondary | ICD-10-CM

## 2011-08-15 MED ORDER — IBUPROFEN 800 MG PO TABS: 800.0000 mg | ORAL_TABLET | Freq: Three times a day (TID) | ORAL | Status: AC | PRN

## 2011-08-15 MED ORDER — TRAMADOL HCL 50 MG PO TABS: 100.0000 mg | ORAL_TABLET | Freq: Four times a day (QID) | ORAL | Status: AC | PRN
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
## 2011-08-15 NOTE — Assessment & Plan Note (Signed)
Most likely rotator cuff injury- pt to continue PT as scheduled.  Go to appt with SM as scheduled for Tuesday.  Pt to control pain over the weekend with motrin 800mg  po 2 x day scheduled and tramadol 2 tabs Q 8 hours as needed for breakthrough pain.

## 2011-08-15 NOTE — Patient Instructions (Signed)
For right shoulder: Motrin 800mg  2 x day- scheduled.   You can take motrin 800mg  up to q 8 hours if needed.  I am going to increase tramadol--  100mg  every 6 hours as needed for breakthrough pain.  Follow up with sports medicine as scheduled.

## 2011-08-15 NOTE — Assessment & Plan Note (Signed)
Pain management with motrin and tramadol as per above.  Pain not worsened compared to previous exam.  No other intervention needed at this time.  Go to PT as scheduled.  F/Up with sports medicine as scheduled.

## 2011-08-19 ENCOUNTER — Encounter: Payer: No Typology Code available for payment source | Admitting: Rehabilitative and Restorative Service Providers"

## 2011-08-19 ENCOUNTER — Ambulatory Visit: Payer: Self-pay | Admitting: Sports Medicine

## 2011-08-25 ENCOUNTER — Encounter: Payer: No Typology Code available for payment source | Admitting: Rehabilitative and Restorative Service Providers"

## 2011-08-27 ENCOUNTER — Ambulatory Visit: Payer: No Typology Code available for payment source | Admitting: Rehabilitative and Restorative Service Providers"

## 2011-09-02 ENCOUNTER — Ambulatory Visit (INDEPENDENT_AMBULATORY_CARE_PROVIDER_SITE_OTHER): Payer: Self-pay | Admitting: Sports Medicine

## 2011-09-02 VITALS — BP 110/70

## 2011-09-02 DIAGNOSIS — M25539 Pain in unspecified wrist: Secondary | ICD-10-CM

## 2011-09-02 DIAGNOSIS — M25531 Pain in right wrist: Secondary | ICD-10-CM

## 2011-09-02 NOTE — Assessment & Plan Note (Signed)
Symptoms mostly likely related to trigger thumb/FPL nodule. US guided CSI as above. Trigger finger rehab. RTC 3 weeks to reassess.

## 2011-09-02 NOTE — Progress Notes (Signed)
  Subjective:    Patient ID: Leah Baker, female    [name]: [birth year], 31 y.o.   MRN: [id number]  HPI I am seeing this patient is a referral from the family practice center. She has had several months of pain in her right hand that she localizes at the metacarpophalangeal joint of her thumb. She notes episodes where it will lock and she will be unable to get the thumb straight. The pain does tend to radiate up to her distal forearm.  Past medical history, surgical history, family history, social history, allergies, and medications reviewed from the medical record and no changes needed.  Review of Systems    No fevers, chills, night sweats, weight loss, chest pain, or shortness of breath.  Social History: Non-smoker. Objective:   Physical Exam General:  Well developed, well nourished, and in no acute distress. Neuro:  Alert and oriented x3, extra-ocular muscles intact. Skin: Warm and dry, no rashes noted. Respiratory:  Not using accessory muscles, speaking in full sentences. Musculoskeletal: Gen. Palpation over the volar aspect of the first metacarpophalangeal joint on the right side. Palpable nodule noted as interphalangeal joint taken through its range of motion. No tenderness to palpation over snuffbox or over first extensor compartment. Negative Finkelstein's test.  I did independently reviewed her x-rays, and they show no signs of degenerative changes at the carpometacarpal joint.  Real-time Ultrasound Guided Injection of: right first flexor pollicis longus tendon sheath at the A1 pulley Ultrasound guided injection is preferred based studies that show increased duration, increased effect, greater accuracy, decreased procedural pain, increased response rate, and decreased cost with ultrasound guided versus blind injection. Consent obtained. Time-out conducted. Noted no overlying erythema, induration, or other signs of local infection. Skin prepped in a sterile fashion. Local  anesthesia: Topical Ethyl chloride. Thumb basal joint as well as first eccentric compartment appeared overall unremarkable there was slightly more fluid in the first extensor compartment than would be expected however. Noted nodule a flexor pollicis longus tendon. With sterile technique and under real time ultrasound guidance: needle advanced in short axis into flexor pollicis longus tendon sheath, 1 cc Kenalog-40, 1 cc lidocaine injected without difficulty. Completed without difficulty Pain immediately resolved suggesting accurate placement of the medication. Advised to call if fevers/chills, erythema, induration, drainage, or persistent bleeding. Images saved.     Assessment & Plan:

## 2011-09-03 ENCOUNTER — Encounter: Payer: Self-pay | Admitting: Rehabilitative and Restorative Service Providers"

## 2011-09-08 ENCOUNTER — Ambulatory Visit
Payer: No Typology Code available for payment source | Attending: Family Medicine | Admitting: Rehabilitative and Restorative Service Providers"

## 2011-09-08 DIAGNOSIS — M6281 Muscle weakness (generalized): Secondary | ICD-10-CM | POA: Insufficient documentation

## 2011-09-08 DIAGNOSIS — IMO0001 Reserved for inherently not codable concepts without codable children: Secondary | ICD-10-CM | POA: Insufficient documentation

## 2011-09-08 DIAGNOSIS — M25519 Pain in unspecified shoulder: Secondary | ICD-10-CM | POA: Insufficient documentation

## 2011-09-10 ENCOUNTER — Ambulatory Visit: Payer: No Typology Code available for payment source | Admitting: Rehabilitative and Restorative Service Providers"

## 2011-09-16 ENCOUNTER — Ambulatory Visit: Payer: No Typology Code available for payment source | Admitting: Rehabilitative and Restorative Service Providers"

## 2011-09-18 ENCOUNTER — Ambulatory Visit: Payer: No Typology Code available for payment source | Admitting: Rehabilitative and Restorative Service Providers"

## 2011-09-24 ENCOUNTER — Ambulatory Visit: Payer: Self-pay | Admitting: Sports Medicine

## 2011-09-30 ENCOUNTER — Ambulatory Visit: Payer: No Typology Code available for payment source | Admitting: Rehabilitative and Restorative Service Providers"

## 2011-10-01 ENCOUNTER — Ambulatory Visit: Payer: Self-pay | Admitting: Sports Medicine

## 2011-10-02 ENCOUNTER — Encounter: Payer: Self-pay | Admitting: Rehabilitative and Restorative Service Providers"

## 2011-10-09 ENCOUNTER — Ambulatory Visit: Payer: Self-pay | Admitting: Family Medicine

## 2011-10-16 ENCOUNTER — Ambulatory Visit (INDEPENDENT_AMBULATORY_CARE_PROVIDER_SITE_OTHER): Payer: Self-pay | Admitting: Family Medicine

## 2011-10-16 ENCOUNTER — Encounter: Payer: Self-pay | Admitting: Family Medicine

## 2011-10-16 VITALS — BP 114/73 | HR 74 | Temp 98.1°F | Ht 63.0 in | Wt 175.0 lb

## 2011-10-16 DIAGNOSIS — N946 Dysmenorrhea, unspecified: Secondary | ICD-10-CM

## 2011-10-16 DIAGNOSIS — M25511 Pain in right shoulder: Secondary | ICD-10-CM

## 2011-10-16 DIAGNOSIS — M25519 Pain in unspecified shoulder: Secondary | ICD-10-CM
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
## 2011-10-16 NOTE — Patient Instructions (Signed)
Dysmenorrhea Menstrual pain is caused by the muscles of the uterus tightening (contracting) during a menstrual period. The muscles of the uterus contract due to the chemicals in the uterine lining. Primary dysmenorrhea is menstrual cramps that last a couple of days when you start having menstrual periods or soon after. This often begins after a teenager starts having her period. As a woman gets older or has a baby, the cramps will usually lesson or disappear. Secondary dysmenorrhea begins later in life, lasts longer, and the pain may be stronger than primary dysmenorrhea. The pain may start before the period and last a few days after the period. This type of dysmenorrhea is usually caused by an underlying problem such as:  The tissue lining the uterus grows outside of the uterus in other areas of the body (endometriosis).   The endometrial tissue, which normally lines the uterus, is found in or grows into the muscular walls of the uterus (adenomyosis).   The pelvic blood vessels are engorged with blood just before the menstrual period (pelvic congestive syndrome).   Overgrowth of cells in the lining of the uterus or cervix (polyps of the uterus or cervix).   Falling down of the uterus (prolapse) because of loose or stretched ligaments.   Depression.   Bladder problems, infection, or inflammation.   Problems with the intestine, a tumor, or irritable bowel syndrome.   Cancer of the female organs or bladder.   A severely tipped uterus.   A very tight opening or closed cervix.   Noncancerous tumors of the uterus (fibroids).   Pelvic inflammatory disease (PID).   Pelvic scarring (adhesions) from a previous surgery.   Ovarian cyst.   An intrauterine device (IUD) used for birth control.  CAUSES  The cause of menstrual pain is often unknown. SYMPTOMS   Cramping or throbbing pain in your lower abdomen.   Sometimes, a woman may also experience headaches.   Lower back pain.    Feeling sick to your stomach (nausea) or vomiting.   Diarrhea.   Sweating or dizziness.  DIAGNOSIS  A diagnosis is based on your history, symptoms, physical examination, diagnostic tests, or procedures. Diagnostic tests or procedures may include:  Blood tests.   An ultrasound.   An examination of the lining of the uterus (dilation and curettage, D&C).   An examination inside your abdomen or pelvis with a scope (laparoscopy).   X-rays.   CT Scan.   MRI.   An examination inside the bladder with a scope (cystoscopy).   An examination inside the intestine or stomach with a scope (colonoscopy, gastroscopy).  TREATMENT  Treatment depends on the cause of the dysmenorrhea. Treatment may include:  Pain medicine prescribed by your caregiver.   Birth control pills.   Hormone replacement therapy.   Nonsteroidal anti-inflammatory drugs (NSAIDs). These may help stop the production of prostaglandins.   An IUD with progesterone hormone in it.   Acupuncture.   Surgery to remove adhesions, endometriosis, ovarian cyst, or fibroids.   Removal of the uterus (hysterectomy).   Progesterone shots to stop the menstrual period.   Cutting the nerves on the sacrum that go to the female organs (presacral neurectomy).   Electric currant to the sacral nerves (sacral nerve stimulation).   Antidepressant medicine.   Psychiatric therapy, counseling, or group therapy.   Exercise and physical therapy.   Meditation and yoga therapy.  HOME CARE INSTRUCTIONS   Only take over-the-counter or prescription medicines for pain, discomfort, or fever as directed by your   caregiver.   Place a heating pad or hot water bottle on your lower back or abdomen. Do not sleep with the heating pad.   Use aerobic exercises, walking, swimming, biking, and other exercises to help lessen the cramping.   Massage to the lower back or abdomen may help.   Stop smoking.   Avoid alcohol and caffeine.   Yoga,  meditation, or acupuncture may help.  SEEK MEDICAL CARE IF:   The pain does not get better with medicine.   You have pain with sexual intercourse.  SEEK IMMEDIATE MEDICAL CARE IF:   Your pain increases and is not controlled with medicines.   You have a fever.   You develop nausea or vomiting with your period not controlled with medicine.   You have abnormal vaginal bleeding with your period.   You pass out.  MAKE SURE YOU:   Understand these instructions.   Will watch your condition.   Will get help right away if you are not doing well or get worse.  Document Released: 05/19/2005 Document Revised: 05/08/2011 Document Reviewed: 09/04/2008 [employer] Patient Information 2012 [employer]. 

## 2011-10-17 ENCOUNTER — Telehealth: Payer: Self-pay | Admitting: Family Medicine

## 2011-10-17 ENCOUNTER — Other Ambulatory Visit: Payer: Self-pay | Admitting: Family Medicine

## 2011-10-17 DIAGNOSIS — N946 Dysmenorrhea, unspecified: Secondary | ICD-10-CM

## 2011-10-17 MED ORDER — NORETHINDRONE 0.35 MG PO TABS: 1.0000 | ORAL_TABLET | Freq: Every day | ORAL | Status: DC

## 2011-10-17 NOTE — Telephone Encounter (Signed)
Called to follow up with pt. Pt has been having severe cramping with steady menstrual flow despite NSAIDs. Discussed with pt that I would call in some micronor to help with bleeding (discussed Depo shot, but was almost 5 oclock. Discussed continued NSAIDs as well as ED visit/calling if sxs didn't improve with micronor).  Case also precepted with Dr. Shawnie Pons on batphone. Agrees with treatment plan.

## 2011-10-17 NOTE — Telephone Encounter (Signed)
Pt was here yesterday for really bad menses cramps - she has done what he suggested and she is still in a lot of pain.  Not sure what to do.

## 2011-10-17 NOTE — Telephone Encounter (Signed)
Returned call to patient.  Has been taking ibuprofen 800mg  and alternating with Tylenol.  Not helping with pain.  Felt nauseous last night and was unable to sleep.  Has also been using heating pad.  Pain is 7-8 and has had constant cramping with small clots and steady menstrual flow.  Will route note to Dr. Alvester Morin for advice and call patient back.  Gaylene Brooks, RN

## 2011-10-19 DIAGNOSIS — N946 Dysmenorrhea, unspecified: Secondary | ICD-10-CM | POA: Insufficient documentation

## 2011-10-19 NOTE — Assessment & Plan Note (Signed)
Clinically improved s/p PT. Continue to follow as needed.

## 2011-10-19 NOTE — Assessment & Plan Note (Signed)
Will place on scheduled NSAIDs. Next step would be depo shot vs. Progestin OCP. Handout given.

## 2011-10-22 ENCOUNTER — Telehealth: Payer: Self-pay | Admitting: Family Medicine

## 2011-10-22 NOTE — Telephone Encounter (Signed)
Leah Baker was expecting to receive letter stating when she was to return to work.  Did not have it with her discharge form when she came in for her appt.  Please let her know when ready for pickup.  Need to have before next Tuesday.

## 2011-10-23 ENCOUNTER — Encounter: Payer: Self-pay | Admitting: Family Medicine

## 2011-10-23 ENCOUNTER — Telehealth: Payer: Self-pay | Admitting: Family Medicine

## 2011-10-23 NOTE — Telephone Encounter (Signed)
Advised pt note to return to work is done per Dr Alvester Morin. Pt to rt to work 10/28/11.

## 2011-10-23 NOTE — Telephone Encounter (Signed)
OK for pt to have return to work note for date of earliest convenience for pt (preferrably no later than day after memorial day) Thank you

## 2011-12-02 ENCOUNTER — Emergency Department (HOSPITAL_COMMUNITY): Payer: No Typology Code available for payment source

## 2011-12-02 ENCOUNTER — Encounter (HOSPITAL_COMMUNITY): Payer: Self-pay | Admitting: Emergency Medicine

## 2011-12-02 ENCOUNTER — Emergency Department (HOSPITAL_COMMUNITY)
Admission: EM | Admit: 2011-12-02 | Discharge: 2011-12-02 | Disposition: A | Discharge status: Home / Self Care | Payer: No Typology Code available for payment source | Attending: Emergency Medicine | Admitting: Emergency Medicine

## 2011-12-02 DIAGNOSIS — M25569 Pain in unspecified knee: Secondary | ICD-10-CM | POA: Insufficient documentation

## 2011-12-02 DIAGNOSIS — S8992XA Unspecified injury of left lower leg, initial encounter: Secondary | ICD-10-CM

## 2011-12-02 DIAGNOSIS — S8002XA Contusion of left knee, initial encounter: Secondary | ICD-10-CM

## 2011-12-02 MED ORDER — HYDROCODONE-ACETAMINOPHEN 5-325 MG PO TABS: 1.0000 | ORAL_TABLET | Freq: Four times a day (QID) | ORAL | Status: AC | PRN

## 2011-12-02 MED ORDER — IBUPROFEN 800 MG PO TABS: 800.0000 mg | ORAL_TABLET | Freq: Three times a day (TID) | ORAL | Status: AC | PRN
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
## 2011-12-02 NOTE — ED Notes (Signed)
Patient transported to X-ray 

## 2011-12-02 NOTE — ED Notes (Signed)
Pt refused knee sleeve 

## 2011-12-02 NOTE — Discharge Instructions (Signed)
Return to emergency Dept. as needed.  Followup with your orthopedist for recheck. ice and elevate your knee.  Your x-rays were normal here today

## 2011-12-03 NOTE — ED Provider Notes (Signed)
Medical screening examination/treatment/procedure(s) were performed by non-physician practitioner and as supervising physician I was immediately available for consultation/collaboration.  Cheri Guppy, MD 12/03/11 [phone]

## 2011-12-30 ENCOUNTER — Ambulatory Visit (INDEPENDENT_AMBULATORY_CARE_PROVIDER_SITE_OTHER): Payer: Self-pay | Admitting: Family Medicine

## 2011-12-30 ENCOUNTER — Encounter: Payer: Self-pay | Admitting: Family Medicine

## 2011-12-30 VITALS — BP 94/67 | HR 87 | Ht 63.0 in | Wt 163.0 lb

## 2011-12-30 DIAGNOSIS — M25511 Pain in right shoulder: Secondary | ICD-10-CM

## 2011-12-30 DIAGNOSIS — M25519 Pain in unspecified shoulder: Secondary | ICD-10-CM

## 2011-12-30 MED ORDER — TRAMADOL HCL 50 MG PO TABS: 50.0000 mg | ORAL_TABLET | Freq: Three times a day (TID) | ORAL | Status: AC | PRN

## 2011-12-30 MED ORDER — CYCLOBENZAPRINE HCL 10 MG PO TABS: 10.0000 mg | ORAL_TABLET | Freq: Three times a day (TID) | ORAL | Status: AC | PRN

## 2011-12-30 MED ORDER — ACETAMINOPHEN 500 MG PO TABS: 500.0000 mg | ORAL_TABLET | Freq: Four times a day (QID) | ORAL | Status: AC | PRN

## 2011-12-30 NOTE — Patient Instructions (Addendum)
It sounds like the shoulder pain may be having a flare of your shoulder pain from the rotator cuff injury.  You can take tylenol 500mg , no more than 6 tablets in 24 hours (no more than 3000mg ). You an alternate between the ibuprofen and the tylenol.  I would recommend decreasing the amount of ibuprofen to 800mg  twice daily. I am also going to send a muscle relaxant to help with the tightness in the muscle. Continue with water therapy if you can as this could help. Additional physical therapy will probably also be helpful.
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
## 2011-12-30 NOTE — Assessment & Plan Note (Addendum)
Likely from impingement syndrome from previous rotator cuff inury. Recommended scheduled ibuprofen 800mg  bid. Recommended reducing current ibuprofen use since she had been taking up to 5 tabs. Patient said she does not tolerate tramadol (nausea and bad abdominal pain), therefore recommended high dose tylenol (no more than 3000mg ) to alternate. Flexeril for muscle spasm. Reduce activities that make pain worst and return to physical therapy. Recommended to return if pain not improved or worsening.

## 2013-02-22 ENCOUNTER — Inpatient Hospital Stay (HOSPITAL_COMMUNITY)
Admission: AD | Admit: 2013-02-22 | Discharge: 2013-02-22 | Disposition: A | Discharge status: Home / Self Care | Payer: Self-pay | Source: Ambulatory Visit | Attending: Obstetrics & Gynecology | Admitting: Obstetrics & Gynecology

## 2013-02-22 ENCOUNTER — Inpatient Hospital Stay (HOSPITAL_COMMUNITY): Payer: Self-pay

## 2013-02-22 ENCOUNTER — Encounter (HOSPITAL_COMMUNITY): Payer: Self-pay | Admitting: General Practice

## 2013-02-22 DIAGNOSIS — N946 Dysmenorrhea, unspecified: Secondary | ICD-10-CM | POA: Insufficient documentation

## 2013-02-22 DIAGNOSIS — N920 Excessive and frequent menstruation with regular cycle: Secondary | ICD-10-CM | POA: Insufficient documentation

## 2013-02-22 DIAGNOSIS — N939 Abnormal uterine and vaginal bleeding, unspecified: Secondary | ICD-10-CM

## 2013-02-22 DIAGNOSIS — R109 Unspecified abdominal pain: Secondary | ICD-10-CM | POA: Insufficient documentation

## 2013-02-22 LAB — CBC
Hemoglobin: 11.4 g/dL — ABNORMAL LOW (ref 12.0–15.0)
MCH: 28.1 pg (ref 26.0–34.0)
Platelets: 314 10*3/uL (ref 150–400)
RBC: 4.05 MIL/uL (ref 3.87–5.11)
WBC: 6.3 10*3/uL (ref 4.0–10.5)

## 2013-02-22 LAB — URINALYSIS, ROUTINE W REFLEX MICROSCOPIC
Bilirubin Urine: NEGATIVE
Nitrite: NEGATIVE
Specific Gravity, Urine: 1.02 (ref 1.005–1.030)
Urobilinogen, UA: 0.2 mg/dL (ref 0.0–1.0)
pH: 7 (ref 5.0–8.0)

## 2013-02-22 LAB — URINE MICROSCOPIC-ADD ON

## 2013-02-22 MED ORDER — OXYCODONE-ACETAMINOPHEN 5-325 MG PO TABS
2.0000 | ORAL_TABLET | ORAL | Status: AC
  Administered 2013-02-22: 2 via ORAL
  Filled 2013-02-22: qty 2

## 2013-02-22 MED ORDER — KETOROLAC TROMETHAMINE 60 MG/2ML IM SOLN
60.0000 mg | INTRAMUSCULAR | Status: AC
  Administered 2013-02-22: 60 mg via INTRAMUSCULAR
  Filled 2013-02-22: qty 2

## 2013-02-22 MED ORDER — MEGESTROL ACETATE 20 MG PO TABS: ORAL_TABLET | ORAL | Status: DC

## 2013-02-22 MED ORDER — MEGESTROL ACETATE 40 MG PO TABS: ORAL_TABLET | ORAL | Status: DC

## 2013-02-22 MED ORDER — OXYCODONE-ACETAMINOPHEN 5-325 MG PO TABS: 1.0000 | ORAL_TABLET | Freq: Four times a day (QID) | ORAL | Status: DC | PRN
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
## 2013-02-22 NOTE — MAU Provider Note (Signed)
Chief Complaint: Vaginal Bleeding and Abdominal Pain   First Provider Initiated Contact with Patient 02/22/13 1736     SUBJECTIVE HPI: Leah Baker is a 32 y.o. [id number] who presents to maternity admissions reporting heavy menstrual bleeding with clots x1 month, dizziness, and cramping x2 weeks, worsening today.  Ibuprofen resolved her cramping when it started but has been ineffective in the last couple of days, last dose at 1 am this morning.  She denies exposure to STDs, vaginal itching/burning, urinary symptoms, h/a, n/v, or fever/chills.    Past Medical History  Diagnosis Date  . Anemia   . PONV (postoperative nausea and vomiting)   . Depression     no meds  . Tooth abscess     currently on abx and vicodin for pain   Past Surgical History  Procedure Laterality Date  . Cesarean section    . Iud removal  07/2009    IUD strings were attached to uterine wall, had to be operatively removed.   . Wisdom tooth extraction    . Cesarean section  03/17/2011    Procedure: CESAREAN SECTION;  Surgeon: Tereso Newcomer, MD;  Location: [facility];  Service: Gynecology;  Laterality: N/A;   History   Social History  . Marital Status: Single    Spouse Name: N/A    Number of Children: N/A  . Years of Education: N/A   Occupational History  . Not on file.   Social History Main Topics  . Smoking status: Former Smoker -- 0.25 packs/day for 1 years    Types: Cigarettes    Quit date: 08/22/2008  . Smokeless tobacco: Never Used  . Alcohol Use: No     Comment: chronic ETOH use. 1-2 beers and 1 shot liquor - stopping ETOH in 09/2010  . Drug Use: No     Comment: last use marijuana 09/2010 (daily use 1-2 marijuana cigarettes per day prior to last use)  . Sexual Activity: Yes     Comment: pregnant   Other Topics Concern  . Not on file   Social History Narrative  . No narrative on file   No current facility-administered medications on file prior to encounter.   Current Outpatient Prescriptions  on File Prior to Encounter  Medication Sig Dispense Refill  . ibuprofen (ADVIL,MOTRIN) 600 MG tablet Take 600 mg by mouth every 6 (six) hours as needed. Pain      . ferrous sulfate 325 (65 FE) MG tablet Take 1 tablet (325 mg total) by mouth 2 (two) times daily with a meal.  60 tablet  3  . FLUoxetine (PROZAC) 20 MG capsule Take 1 capsule (20 mg total) by mouth daily.  30 capsule  2   No Known Allergies  ROS: Pertinent items in HPI  OBJECTIVE Blood pressure 109/72, pulse 68, temperature 97.7 F (36.5 C), temperature source Oral, resp. rate 18, height 5\' 3"  (1.6 m), weight 73.936 kg (163 lb), last menstrual period 01/31/2013. GENERAL: Well-developed, well-nourished female in no acute distress.  HEENT: Normocephalic HEART: normal rate RESP: normal effort ABDOMEN: Soft, non-tender EXTREMITIES: Nontender, no edema NEURO: Alert and oriented Pelvic exam: Cervix pink, visually closed, without lesion, moderate amount dark red blood without clots noted, vaginal walls and external genitalia normal Bimanual exam: Cervix 0/long/high, firm, anterior, neg CMT, uterus mildly tender, nonenlarged, adnexa without tenderness, enlargement, or mass  LAB RESULTS Results for orders placed during the hospital encounter of 02/22/13 (from the past 24 hour(s))  URINALYSIS, ROUTINE W REFLEX MICROSCOPIC  Status: Abnormal   Collection Time    02/22/13  5:00 PM      Result Value Range   Color, Urine YELLOW  YELLOW   APPearance HAZY (*) CLEAR   Specific Gravity, Urine 1.020  1.005 - 1.030   pH 7.0  5.0 - 8.0   Glucose, UA NEGATIVE  NEGATIVE mg/dL   Hgb urine dipstick LARGE (*) NEGATIVE   Bilirubin Urine NEGATIVE  NEGATIVE   Ketones, ur NEGATIVE  NEGATIVE mg/dL   Protein, ur NEGATIVE  NEGATIVE mg/dL   Urobilinogen, UA 0.2  0.0 - 1.0 mg/dL   Nitrite NEGATIVE  NEGATIVE   Leukocytes, UA NEGATIVE  NEGATIVE  URINE MICROSCOPIC-ADD ON     Status: Abnormal   Collection Time    02/22/13  5:00 PM      Result  Value Range   Squamous Epithelial / LPF FEW (*) RARE   RBC / HPF 21-50  <3 RBC/hpf  POCT PREGNANCY, URINE     Status: None   Collection Time    02/22/13  5:08 PM      Result Value Range   Preg Test, Ur NEGATIVE  NEGATIVE  CBC     Status: Abnormal   Collection Time    02/22/13  7:13 PM      Result Value Range   WBC 6.3  4.0 - 10.5 K/uL   RBC 4.05  3.87 - 5.11 MIL/uL   Hemoglobin 11.4 (*) 12.0 - 15.0 g/dL   HCT 30.8 (*) 65.7 - 84.6 %   MCV 84.7  78.0 - 100.0 fL   MCH 28.1  26.0 - 34.0 pg   MCHC 33.2  30.0 - 36.0 g/dL   RDW 96.2  95.2 - 84.1 %   Platelets 314  150 - 400 K/uL    IMAGING US Transvaginal Non-ob  02/22/2013   CLINICAL DATA:  Heavy vaginal bleeding for 3 weeks. History of IUD removal.  EXAM: TRANSABDOMINAL AND TRANSVAGINAL ULTRASOUND OF PELVIS  TECHNIQUE: Both transabdominal and transvaginal ultrasound examinations of the pelvis were performed. Transabdominal technique was performed for global imaging of the pelvis including uterus, ovaries, adnexal regions, and pelvic cul-de-sac. It was necessary to proceed with endovaginal exam following the transabdominal exam to visualize the endometrium and ovaries to better advantage.  COMPARISON:  Pelvic ultrasound 03/05/2009.  FINDINGS: Uterus  Measurements: 9.5 x 3.6 x 5.5 cm. No fibroids or other mass visualized.  Endometrium  Thickness: 11 mm. No intrauterine device demonstrated. No focal abnormality visualized.  Right ovary  Measurements: 3.2 x 2.7 x 2.4 cm. Normal appearance/no adnexal mass.  Left ovary  Measurements: 2.6 x 2.5 x 2.3 cm. Normal appearance/no adnexal mass.  Other findings  No free fluid.  IMPRESSION: Normal pelvic ultrasound. No significant endometrial thickening or adnexal pathology.   Electronically Signed   By: Roxy Horseman   On: 02/22/2013 19:09   US Pelvis Complete  02/22/2013   CLINICAL DATA:  Heavy vaginal bleeding for 3 weeks. History of IUD removal.  EXAM: TRANSABDOMINAL AND TRANSVAGINAL ULTRASOUND OF PELVIS   TECHNIQUE: Both transabdominal and transvaginal ultrasound examinations of the pelvis were performed. Transabdominal technique was performed for global imaging of the pelvis including uterus, ovaries, adnexal regions, and pelvic cul-de-sac. It was necessary to proceed with endovaginal exam following the transabdominal exam to visualize the endometrium and ovaries to better advantage.  COMPARISON:  Pelvic ultrasound 03/05/2009.  FINDINGS: Uterus  Measurements: 9.5 x 3.6 x 5.5 cm. No fibroids or other mass visualized.  Endometrium  Thickness: 11 mm. No intrauterine device demonstrated. No focal abnormality visualized.  Right ovary  Measurements: 3.2 x 2.7 x 2.4 cm. Normal appearance/no adnexal mass.  Left ovary  Measurements: 2.6 x 2.5 x 2.3 cm. Normal appearance/no adnexal mass.  Other findings  No free fluid.  IMPRESSION: Normal pelvic ultrasound. No significant endometrial thickening or adnexal pathology.   Electronically Signed   By: Roxy Horseman   On: 02/22/2013 19:09    ASSESSMENT 1. Dysmenorrhea   2. Abnormal uterine bleeding (AUB)     PLAN Toradol 60 mg IM and Percocet 2 tabs in MAU Discharge home Megace 40 mg TID x3 days, BID x3 days, then daily for total of 30 days tx Ibuprofen 600 mg PO Q 6hours Percocet 5/325 x15 tabs, take 1-2 tabs Q 4 hours PRN F/U with Family Practice in October when insurance begins Return to MAU as needed     Medication List    STOP taking these medications       multivitamin-prenatal 27-0.8 MG Tabs tablet      TAKE these medications       ferrous sulfate 325 (65 FE) MG tablet  Take 1 tablet (325 mg total) by mouth 2 (two) times daily with a meal.     FLUoxetine 20 MG capsule  Commonly known as:  PROZAC  Take 1 capsule (20 mg total) by mouth daily.     ibuprofen 600 MG tablet  Commonly known as:  ADVIL,MOTRIN  Take 600 mg by mouth every 6 (six) hours as needed. Pain     megestrol 20 MG tablet  Commonly known as:  MEGACE  Take 2 tablets 3  times/day for 3 days, 2 tablets twice per day/3 days, then 2 tablets daily for the rest of the month.     oxyCODONE-acetaminophen 5-325 MG per tablet  Commonly known as:  PERCOCET/ROXICET  Take 1-2 tablets by mouth every 6 (six) hours as needed for pain.       Follow-up Information   Schedule an appointment as soon as possible for a visit with [facility]   Contact information:   [address] [place] [address]       Sharen Counter Certified Nurse-Midwife 02/22/2013  8:47 PM

## 2013-02-26 NOTE — MAU Provider Note (Signed)
Attestation of Attending Supervision of Advanced Practitioner (CNM/NP): Evaluation and management procedures were performed by the Advanced Practitioner under my supervision and collaboration. I have reviewed the Advanced Practitioner's note and chart, and I agree with the management and plan.  Lilian Fuhs H. 4:34 PM

## 2013-03-10 ENCOUNTER — Ambulatory Visit: Payer: Self-pay | Admitting: Emergency Medicine

## 2013-03-11 ENCOUNTER — Ambulatory Visit (INDEPENDENT_AMBULATORY_CARE_PROVIDER_SITE_OTHER): Payer: Self-pay | Admitting: Emergency Medicine

## 2013-03-11 ENCOUNTER — Encounter: Payer: Self-pay | Admitting: Emergency Medicine

## 2013-03-11 VITALS — BP 108/71 | HR 71 | Temp 98.2°F | Ht 63.0 in | Wt 159.0 lb

## 2013-03-11 DIAGNOSIS — N926 Irregular menstruation, unspecified: Secondary | ICD-10-CM

## 2013-03-11 DIAGNOSIS — N939 Abnormal uterine and vaginal bleeding, unspecified: Secondary | ICD-10-CM

## 2013-03-11 LAB — CBC
HCT: 34.2 % — ABNORMAL LOW (ref 36.0–46.0)
Hemoglobin: 11.3 g/dL — ABNORMAL LOW (ref 12.0–15.0)
RDW: 14.4 % (ref 11.5–15.5)
WBC: 5.3 10*3/uL (ref 4.0–10.5)

## 2013-03-11 LAB — TSH: TSH: 0.831 u[IU]/mL (ref 0.350–4.500)

## 2013-03-11 MED ORDER — DICLOFENAC SODIUM 75 MG PO TBEC: 75.0000 mg | DELAYED_RELEASE_TABLET | Freq: Two times a day (BID) | ORAL | Status: DC

## 2013-03-11 MED ORDER — MEDROXYPROGESTERONE ACETATE 10 MG PO TABS: 10.0000 mg | ORAL_TABLET | Freq: Every day | ORAL | Status: DC

## 2013-03-11 NOTE — Progress Notes (Signed)
  Subjective:    Patient ID: Leah Baker, female    [name]: [birth year], 32 y.o.   MRN: [id number]  HPI Leah Baker is here for vaginal bleeding.  She reports heavy vaginal bleeding for the last 6 weeks.  She was having regular periods (with cramps and heavy bleeding) for the last 2 years.  Her cycle started like normal on 01/31/13.  It slowed down as usual on the 6th, but then she felt a large gush on the 7th.  Since then, she has had heavier than normal bleeding, worse cramps, and is passing lots of clots.  She is going through at least 8 pads a day.  She was seen at MAU 2 weeks ago and started on Megace and ibuprofen 600mg .  She had a pelvic ultrasound at that time that was normal and a hemoglobin that was stable from previous.  She states that the medications have not helped the bleeding or the cramps at all.  She is taking iron pills. She has had 2 children; had a BTL after her 2nd child 2 years ago.  Has previously been on OCPs, depo and had a mirena.    I have reviewed and updated the following as appropriate: allergies and current medications SHx: former smoker  Review of Systems Pressure headache No dizziness, shortness of breath or chest pain     Objective:   Physical Exam BP 108/71  Pulse 71  Temp(Src) 98.2 F (36.8 C) (Oral)  Ht 5\' 3"  (1.6 m)  Wt 159 lb (72.122 kg)  BMI 28.17 kg/m2  LMP 01/31/2013  Breastfeeding? No Gen: alert, cooperative, NAD Pelvis: normal external genitalia, normal vagina, blood and clots in vaginal vault, normal appearing cervix with slowly oozing blood from os     Assessment & Plan:

## 2013-03-11 NOTE — Assessment & Plan Note (Addendum)
Continued bleeding despite megace. Pelvic ultrasound reviewed and normal. Will check TSH and CBC.  Will stop megace and start provera 10mg  x10 days. Will give diclofenac 75mg  BID. Follow up in 2-3 weeks. May need to consider endometrial biopsy vs referral for hysteroscopy if continued bleeding.

## 2013-03-11 NOTE — Patient Instructions (Signed)
It was nice to meet you!  Please stop the Megace and Ibuprofen. Start taking Provera 10mg , 1 pill once a day for 10 days. I sent a prescription for Diclofenac - this is a stronger medicine than Ibuprofen for your cramps.  Take 1 pill twice a day.  Do not take with ibuprofen, advil or aleve.    We will go over your lab test at your follow up appointment.  Follow up in 2-3 weeks.

## 2013-05-19 ENCOUNTER — Encounter: Payer: Self-pay | Admitting: Family Medicine

## 2013-05-19 ENCOUNTER — Ambulatory Visit (INDEPENDENT_AMBULATORY_CARE_PROVIDER_SITE_OTHER): Payer: Self-pay | Admitting: Family Medicine

## 2013-05-19 ENCOUNTER — Ambulatory Visit
Admission: RE | Admit: 2013-05-19 | Discharge: 2013-05-19 | Disposition: A | Discharge status: Home / Self Care | Payer: No Typology Code available for payment source | Source: Ambulatory Visit | Attending: Family Medicine | Admitting: Family Medicine

## 2013-05-19 VITALS — BP 105/77 | HR 80 | Ht 63.0 in | Wt 157.9 lb

## 2013-05-19 DIAGNOSIS — M25569 Pain in unspecified knee: Secondary | ICD-10-CM

## 2013-05-19 DIAGNOSIS — M25562 Pain in left knee: Secondary | ICD-10-CM

## 2013-05-19 DIAGNOSIS — M25519 Pain in unspecified shoulder: Secondary | ICD-10-CM

## 2013-05-19 DIAGNOSIS — M25511 Pain in right shoulder: Secondary | ICD-10-CM

## 2013-05-19 DIAGNOSIS — G8929 Other chronic pain: Secondary | ICD-10-CM | POA: Insufficient documentation

## 2013-05-19 MED ORDER — DICLOFENAC SODIUM 75 MG PO TBEC: 75.0000 mg | DELAYED_RELEASE_TABLET | Freq: Two times a day (BID) | ORAL | Status: DC

## 2013-05-19 MED ORDER — CYCLOBENZAPRINE HCL 10 MG PO TABS: 10.0000 mg | ORAL_TABLET | Freq: Two times a day (BID) | ORAL | Status: DC | PRN

## 2013-05-19 NOTE — Patient Instructions (Signed)
Use ice and compression to the knee. Take the diclofenac twice a day for 2-3 days with food and water.   If not better in 1-2 weeks, please return to the clinic.
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
## 2013-05-19 NOTE — Assessment & Plan Note (Signed)
Xray obtained is normal not showing any dislocation or AC separation Pain is mostly located in trapezius muscle. Normal range of motion and no evidence of new rotator cuff injury at this time Will treat symptomatically with diclofenac and flexeril.  Return to care if not better or worst in 1-2 weeks

## 2013-05-20 ENCOUNTER — Telehealth: Payer: Self-pay | Admitting: Emergency Medicine

## 2013-05-20 MED ORDER — TRAMADOL HCL 50 MG PO TABS: 50.0000 mg | ORAL_TABLET | Freq: Three times a day (TID) | ORAL | Status: DC | PRN

## 2013-05-20 NOTE — Telephone Encounter (Signed)
Called and discussed with patient.  She states that she was up off and on all night secondary to pain in her shoulder.  She is taking the diclofenac and flexeril as prescribed.  I will call in tramadol 50mg  q8hr prn #20.  Discussed using this primarily at night to help with sleep.  She is to continue the diclofenac and flexeril.  Discussed that the tramadol is a temporary medication and if she finds she still needs it in 1-2 weeks, she needs to come to re-evaluate the shoulder.  She expressed understanding and agreement.

## 2013-05-20 NOTE — Telephone Encounter (Signed)
Pt called to let Dr. Piedad Climes  To call her in something stronger than diclofenac. She said that it is not helping at all. jw

## 2013-05-20 NOTE — Telephone Encounter (Signed)
Will fwd. To PCP. Last OV states 'f/up in 2 weeks, if not better'. X-ray was normal. .Arlyss Repress

## 2013-07-20 ENCOUNTER — Emergency Department (HOSPITAL_COMMUNITY)
Admission: EM | Admit: 2013-07-20 | Discharge: 2013-07-20 | Disposition: A | Discharge status: Home / Self Care | Payer: Medicaid Other | Source: Home / Self Care | Attending: Emergency Medicine | Admitting: Emergency Medicine

## 2013-07-20 ENCOUNTER — Encounter (HOSPITAL_COMMUNITY): Payer: Self-pay | Admitting: Emergency Medicine

## 2013-07-20 DIAGNOSIS — K089 Disorder of teeth and supporting structures, unspecified: Secondary | ICD-10-CM

## 2013-07-20 DIAGNOSIS — K0889 Other specified disorders of teeth and supporting structures: Secondary | ICD-10-CM

## 2013-07-20 DIAGNOSIS — K029 Dental caries, unspecified: Secondary | ICD-10-CM

## 2013-07-20 MED ORDER — HYDROCODONE-ACETAMINOPHEN 5-325 MG PO TABS: 1.0000 | ORAL_TABLET | Freq: Four times a day (QID) | ORAL | Status: DC | PRN

## 2013-07-20 MED ORDER — LIDOCAINE VISCOUS 2 % MT SOLN: OROMUCOSAL | Status: DC

## 2013-07-20 MED ORDER — PENICILLIN V POTASSIUM 500 MG PO TABS: 500.0000 mg | ORAL_TABLET | Freq: Three times a day (TID) | ORAL | Status: DC

## 2013-07-20 NOTE — ED Notes (Signed)
Pt c/o right lower dental pain since Monday Sxs also include right ear pain Taking diclofenac and ibup w/no relief Alert w/no signs of acute distress.

## 2013-07-20 NOTE — ED Provider Notes (Signed)
CSN: [id number]     Arrival date & time 07/20/13  0907 History   First MD Initiated Contact with Patient 07/20/13 [phone]     Chief Complaint  Patient presents with  . Dental Pain     (Consider location/radiation/quality/duration/timing/severity/associated sxs/prior Treatment) HPI Comments: Patient reports that right rear molar broke while she was eating a piece of candy on 07/17/2013. Remainder of tooth and gumline have been painful since fracture. Has developed radiation of pain to right ear over past 12-24 hours. Limited relief with ibuprofen and voltaren. No fever or facial swelling. Patient is non-smoker  Patient is a 33 y.o. female presenting with tooth pain. The history is provided by the patient.  Dental Pain   Past Medical History  Diagnosis Date  . Anemia   . PONV (postoperative nausea and vomiting)   . Depression     no meds  . Tooth abscess     currently on abx and vicodin for pain   Past Surgical History  Procedure Laterality Date  . Cesarean section    . Iud removal  07/2009    IUD strings were attached to uterine wall, had to be operatively removed.   . Wisdom tooth extraction    . Cesarean section  03/17/2011    Procedure: CESAREAN SECTION;  Surgeon: Osborne Oman, MD;  Location: [facility];  Service: Gynecology;  Laterality: N/A;   No family history on file. History  Substance Use Topics  . Smoking status: Former Smoker -- 0.25 packs/day for 1 years    Types: Cigarettes    Quit date: 08/22/2008  . Smokeless tobacco: Never Used  . Alcohol Use: No     Comment: chronic ETOH use. 1-2 beers and 1 shot liquor - stopping ETOH in 09/2010   OB History   Grav Para Term Preterm Abortions TAB SAB Ect Mult Living   4 2 2  0 2 2 0 0 0 2     Review of Systems  All other systems reviewed and are negative.      Allergies  Review of patient's allergies indicates no known allergies.  Home Medications   Current Outpatient Rx  Name  Route  Sig  Dispense  Refill  .  diclofenac (VOLTAREN) 75 MG EC tablet   Oral   Take 1 tablet (75 mg total) by mouth 2 (two) times daily.   30 tablet   1   . cyclobenzaprine (FLEXERIL) 10 MG tablet   Oral   Take 1 tablet (10 mg total) by mouth 2 (two) times daily as needed for muscle spasms.   30 tablet   0   . EXPIRED: ferrous sulfate 325 (65 FE) MG tablet   Oral   Take 1 tablet (325 mg total) by mouth 2 (two) times daily with a meal.   60 tablet   3   . EXPIRED: FLUoxetine (PROZAC) 20 MG capsule   Oral   Take 1 capsule (20 mg total) by mouth daily.   30 capsule   2   . medroxyPROGESTERone (PROVERA) 10 MG tablet   Oral   Take 1 tablet (10 mg total) by mouth daily.   10 tablet   0   . traMADol (ULTRAM) 50 MG tablet   Oral   Take 1 tablet (50 mg total) by mouth every 8 (eight) hours as needed for moderate pain.   20 tablet   0    BP 107/77  Pulse 76  Temp(Src) 97.7 F (36.5 C) (Oral)  Resp  18  SpO2 97%  LMP 06/18/2013 Physical Exam  Nursing note and vitals reviewed. Constitutional: She is oriented to person, place, and time. She appears well-developed and well-nourished. No distress.  HENT:  Head: Normocephalic and atraumatic.  Right Ear: Hearing, tympanic membrane, external ear and ear canal normal.  Left Ear: Hearing, tympanic membrane, external ear and ear canal normal.  Nose: Nose normal.  Mouth/Throat:    Diffuse poor dentition  Eyes: Conjunctivae are normal.  Neck: Normal range of motion. Neck supple.  Cardiovascular: Normal rate.   Pulmonary/Chest: Effort normal.  Lymphadenopathy:    She has no cervical adenopathy.  Neurological: She is alert and oriented to person, place, and time.  Skin: Skin is warm and dry. No rash noted.  Psychiatric: She has a normal mood and affect. Her behavior is normal.    ED Course  Procedures (including critical care time) Labs Review Labs Reviewed - No data to display Imaging Review No results found.    MDM   Final diagnoses:  None    Patient states she has established an appointment with her dentist on March 3rd, 2015. Patient given additional dental contact information in hopes that she can find an appointment before March. Will place on PCN VK, topical lidocaine and vicodin (#15) and for gumline swelling and facial pain.    [place], [place] 07/20/13 [phone]

## 2013-07-20 NOTE — Discharge Instructions (Signed)
Dental Caries  Dental caries (also called tooth decay) is the most common oral disease. It can occur at any age, but is more common in children and young adults.  HOW DENTAL CARIES DEVELOPS  The process of decay begins when bacteria and foods (particularly sugars and starches) combine in your mouth to produce plaque. Plaque is a substance that sticks to the hard, outer surface of a tooth (enamel). The bacteria in plaque produce acids that attack enamel. These acids may also attack the root surface of a tooth (cementum) if it is exposed. Repeated attacks dissolve these surfaces and create holes in the tooth (cavities). If left untreated, the acids destroy the other layers of the tooth.  RISK FACTORS  Frequent sipping of sugary beverages.   Frequent snacking on sugary and starchy foods, especially those that easily get stuck in the teeth.   Poor oral hygiene.   Dry mouth.   Substance abuse such as methamphetamine abuse.   Broken or poor-fitting dental restorations.   Eating disorders.   Gastroesophageal reflux disease (GERD).   Certain radiation treatments to the head and neck. SYMPTOMS In the early stages of dental caries, symptoms are seldom present. Sometimes white, chalky areas may be seen on the enamel or other tooth layers. In later stages, symptoms may include:  Pits and holes on the enamel.  Toothache after sweet, hot, or cold foods or drinks are consumed.  Pain around the tooth.  Swelling around the tooth. DIAGNOSIS  Most of the time, dental caries is detected during a regular dental checkup. A diagnosis is made after a thorough medical and dental history is taken and the surfaces of your teeth are checked for signs of dental caries. Sometimes special instruments, such as lasers, are used to check for dental caries. Dental X-ray exams may be taken so that areas not visible to the eye (such as between the contact areas of the teeth) can be checked for cavities.    TREATMENT  If dental caries is in its early stages, it may be reversed with a fluoride treatment or an application of a remineralizing agent at the dental office. Thorough brushing and flossing at home is needed to aid these treatments. If it is in its later stages, treatment depends on the location and extent of tooth destruction:   If a small area of the tooth has been destroyed, the destroyed area will be removed and cavities will be filled with a material such as gold, silver amalgam, or composite resin.   If a large area of the tooth has been destroyed, the destroyed area will be removed and a cap (crown) will be fitted over the remaining tooth structure.   If the center part of the tooth (pulp) is affected, a procedure called a root canal will be needed before a filling or crown can be placed.   If most of the tooth has been destroyed, the tooth may need to be pulled (extracted). HOME CARE INSTRUCTIONS You can prevent, stop, or reverse dental caries at home by practicing good oral hygiene. Good oral hygiene includes:  Thoroughly cleaning your teeth at least twice a day with a toothbrush and dental floss.   Using a fluoride toothpaste. A fluoride mouth rinse may also be used if recommended by your dentist or health care provider.   Restricting the amount of sugary and starchy foods and sugary liquids you consume.   Avoiding frequent snacking on these foods and sipping of these liquids.   Keeping regular visits  Restricting the amount of sugary and starchy foods and sugary liquids you consume.    · Avoiding frequent snacking on these foods and sipping of these liquids.    · Keeping regular visits with a dentist for checkups and cleanings.  PREVENTION   · Practice good oral hygiene.  · Consider a dental sealant. A dental sealant is a coating material that is applied by your dentist to the pits and grooves of teeth. The sealant prevents food from being trapped in them. It may protect the teeth for several years.  · Ask about fluoride supplements if you live in a community without fluorinated water or with water that has a low fluoride content. Use fluoride supplements  as directed by your dentist or health care provider.  · Allow fluoride varnish applications to teeth if directed by your dentist or health care provider.  Document Released: 02/08/2002 Document Revised: 01/19/2013 Document Reviewed: 05/21/2012  [employer]® Patient Information ©2014 [employer].

## 2013-07-21 NOTE — ED Provider Notes (Signed)
Medical screening examination/treatment/procedure(s) were performed by non-physician practitioner and as supervising physician I was immediately available for consultation/collaboration.  Philipp Deputy, [name]  Harden Mo, MD 07/21/13 [phone]

## 2013-08-03 ENCOUNTER — Ambulatory Visit (INDEPENDENT_AMBULATORY_CARE_PROVIDER_SITE_OTHER): Payer: Medicaid Other | Admitting: Family Medicine

## 2013-08-03 ENCOUNTER — Encounter: Payer: Self-pay | Admitting: Family Medicine

## 2013-08-03 VITALS — BP 102/65 | HR 92 | Temp 98.1°F | Ht 63.0 in | Wt 156.0 lb

## 2013-08-03 DIAGNOSIS — M25562 Pain in left knee: Secondary | ICD-10-CM

## 2013-08-03 DIAGNOSIS — M25569 Pain in unspecified knee: Secondary | ICD-10-CM

## 2013-08-03 MED ORDER — NAPROXEN 500 MG PO TABS: 500.0000 mg | ORAL_TABLET | Freq: Two times a day (BID) | ORAL | Status: DC

## 2013-08-03 NOTE — Patient Instructions (Signed)
Apply a compressive bandage or knee sleeve. Rest and elevate the affected painful area.  Apply cold compresses intermittently as needed.  As pain recedes, begin normal activities slowly as tolerated.  Call if symptoms persist.

## 2013-08-05 NOTE — Progress Notes (Signed)
Family Medicine Office Visit Note   Subjective:   Patient ID: Leah Baker, female  [name]: 01-04-1981, 33 y.o.. MRN: [id number]   Pt that comes today for same day appointment complaining of left knee pain after a fall. She reports on Monday she missed a step and hit the ground with her knee. She has been able to bare weight and walk without difficulty but reports pain still persists. Denies redness, bruising or swelling. She mentions the using tight pants help her with pain.  Review of Systems:  Per HPI  Objective:   Physical Exam: Gen:  NAD HEENT: Moist mucous membranes  CV: Regular rate and rhythm, no murmurs rubs or gallops PULM: Clear to auscultation bilaterally. No wheezes/rales/rhonchi EXT: left knee with no edema, erythema, ROM is intact. No joint laxity. Neurovascular intact.  Neuro: Alert and oriented x3. No focalization  Assessment & Plan:

## 2013-08-05 NOTE — Assessment & Plan Note (Signed)
Similar episode 2 months ago. Knee with no signs of trauma, completely bening physical exam.  xrays ordered, RICE and f/u pending on xray results and/or symptoms.

## 2013-08-09 ENCOUNTER — Telehealth: Payer: Self-pay | Admitting: Emergency Medicine

## 2013-08-09 NOTE — Telephone Encounter (Signed)
Will fwd to [place] for review. (see last OV) .[place]

## 2013-08-09 NOTE — Telephone Encounter (Signed)
Patient was prescribed Naproxen and states that meds are not agreeing with her stomach. She is having a lot of nausea and a churning feeling. Please advise patient what she needs to do.

## 2013-08-09 NOTE — Telephone Encounter (Signed)
Most of medication in that category have the same side effect profile. She can stop med and come for re-evaluation

## 2013-08-12 ENCOUNTER — Encounter (HOSPITAL_COMMUNITY): Payer: Self-pay | Admitting: Emergency Medicine

## 2013-08-12 ENCOUNTER — Emergency Department (INDEPENDENT_AMBULATORY_CARE_PROVIDER_SITE_OTHER)
Admission: EM | Admit: 2013-08-12 | Discharge: 2013-08-12 | Disposition: A | Discharge status: Home / Self Care | Payer: Medicaid Other | Source: Home / Self Care | Attending: Family Medicine | Admitting: Family Medicine

## 2013-08-12 ENCOUNTER — Ambulatory Visit: Payer: Medicaid Other | Admitting: Emergency Medicine

## 2013-08-12 DIAGNOSIS — M25569 Pain in unspecified knee: Secondary | ICD-10-CM

## 2013-08-12 MED ORDER — METHYLPREDNISOLONE ACETATE 40 MG/ML IJ SUSP
INTRAMUSCULAR | Status: AC
  Filled 2013-08-12: qty 5

## 2013-08-12 MED ORDER — METHYLPREDNISOLONE ACETATE 40 MG/ML IJ SUSP
40.0000 mg | Freq: Once | INTRAMUSCULAR | Status: AC
  Administered 2013-08-12: 40 mg via INTRA_ARTICULAR

## 2013-08-12 MED ORDER — TRAMADOL HCL 50 MG PO TABS: 50.0000 mg | ORAL_TABLET | Freq: Four times a day (QID) | ORAL | Status: DC | PRN

## 2013-08-12 NOTE — ED Notes (Addendum)
Patient reports twisted left knee.  Pain intermittent for 2 weeks.  Patient reports this is an old basketball injury and reoccurs over the years.

## 2013-08-12 NOTE — ED Provider Notes (Signed)
Leah Baker is a 33 y.o. female who presents to Urgent Care today for left knee pain. Patient has had ongoing intermittent left knee pain for several years. She states that her left knee feels unstable. He notes occasional locking catching and giving way. She exacerbated her knee this morning when trying to get out of the car. She points to the medial aspect of her knee. She denies any radiating pain weakness or numbness. She's had several x-rays recently. She has not had MRI.   Past Medical History  Diagnosis Date  . Anemia   . PONV (postoperative nausea and vomiting)   . Depression     no meds  . Tooth abscess     currently on abx and vicodin for pain   History  Substance Use Topics  . Smoking status: Former Smoker -- 0.25 packs/day for 1 years    Types: Cigarettes    Quit date: 08/22/2008  . Smokeless tobacco: Never Used  . Alcohol Use: No     Comment: chronic ETOH use. 1-2 beers and 1 shot liquor - stopping ETOH in 09/2010   ROS as above Medications: No current facility-administered medications for this encounter.   Current Outpatient Prescriptions  Medication Sig Dispense Refill  . ferrous sulfate 325 (65 FE) MG tablet Take 1 tablet (325 mg total) by mouth 2 (two) times daily with a meal.  60 tablet  3  . FLUoxetine (PROZAC) 20 MG capsule Take 1 capsule (20 mg total) by mouth daily.  30 capsule  2  . medroxyPROGESTERone (PROVERA) 10 MG tablet Take 1 tablet (10 mg total) by mouth daily.  10 tablet  0  . traMADol (ULTRAM) 50 MG tablet Take 1 tablet (50 mg total) by mouth every 6 (six) hours as needed.  15 tablet  0    Exam:  BP 109/68  Pulse 74  Temp(Src) 98.6 F (37 C) (Oral)  Resp 18  SpO2 100%  LMP 07/19/2013 Gen: Well NAD Left knee: Normal-appearing no effusion. Range of motion 0-120 with 1+ retropatellar crepitations. Tender palpation medial joint line. Nontender otherwise. Stable ligamentous exam. Positive medial McMurray's test negative  bilaterally. Negative patellar apprehension test.   Right knee: Normal-appearing no effusion. Full range of motion. Stable ligamentous exam. Negative McMurray's testing.   Capillary refill sensation pulses are intact bilateral lower extremities  Knee injection. Left Consent obtained and timeout performed. Patient seated in a comfortable position with legs hanging off the table.  The medial Peri-patellar tendon space was palpated and marked. The skin was then cleaned with alcohol. Cold spray applied. A 25-gauge inch and a half needle was used to inject 40 mg of Depo-Medrol and 4 mL of lidocaine. Patient tolerated procedure well no bleeding. Pain improved following injection  Assessment and Plan: 33 y.o. female with left knee pain. Most likely medial meniscus injury. Corticosteroid injection today. Recommend patient followup with orthopedics for further evaluation and management. Would benefit from MRI.  Discussed warning signs or symptoms. Please see discharge instructions. Patient expresses understanding.    Gregor Hams, MD 08/12/13 [phone]

## 2013-08-12 NOTE — Discharge Instructions (Signed)
Thank you for coming in today. I think you need an MRI to evaluate for medial meniscus injury.  Use tramadol as needed Followup with Dr. Alfonso Ramus at [facility] or Dr. Bridgett Larsson for referral.  This will continue to bother you. Call or go to the ER if you develop a large red swollen joint with extreme pain or oozing puss.   Joint Injection Care After Refer to this sheet in the next few days. These instructions provide you with information on caring for yourself after you have had a joint injection. Your caregiver also may give you more specific instructions. Your treatment has been planned according to current medical practices, but problems sometimes occur. Call your caregiver if you have any problems or questions after your procedure. After any type of joint injection, it is not uncommon to experience:  Soreness, swelling, or bruising around the injection site.  Mild numbness, tingling, or weakness around the injection site caused by the numbing medicine used before or with the injection. It also is possible to experience the following effects associated with the specific agent after injection:  Iodine-based contrast agents:  Allergic reaction (itching, hives, widespread redness, and swelling beyond the injection site).  Corticosteroids (These effects are rare.):  Allergic reaction.  Increased blood sugar levels (If you have diabetes and you notice that your blood sugar levels have increased, notify your caregiver).  Increased blood pressure levels.  Mood swings.  Hyaluronic acid in the use of viscosupplementation.  Temporary heat or redness.  Temporary rash and itching.  Increased fluid accumulation in the injected joint. These effects all should resolve within a day after your procedure.  HOME CARE INSTRUCTIONS  Limit yourself to light activity the day of your procedure. Avoid lifting heavy objects, bending, stooping, or twisting.  Take prescription or  over-the-counter pain medication as directed by your caregiver.  You may apply ice to your injection site to reduce pain and swelling the day of your procedure. Ice may be applied 03-04 times:  Put ice in a plastic bag.  Place a towel between your skin and the bag.  Leave the ice on for no longer than 15-20 minutes each time. SEEK IMMEDIATE MEDICAL CARE IF:   Pain and swelling get worse rather than better or extend beyond the injection site.  Numbness does not go away.  Blood or fluid continues to leak from the injection site.  You have chest pain.  You have swelling of your face or tongue.  You have trouble breathing or you become dizzy.  You develop a fever, chills, or severe tenderness at the injection site that last longer than 1 day. MAKE SURE YOU:  Understand these instructions.  Watch your condition.  Get help right away if you are not doing well or if you get worse. Document Released: 01/30/2011 Document Revised: 08/11/2011 Document Reviewed: 01/30/2011 [facility] Patient Information 2014 [place].

## 2013-08-15 ENCOUNTER — Encounter: Payer: Self-pay | Admitting: Emergency Medicine

## 2013-08-15 ENCOUNTER — Ambulatory Visit (INDEPENDENT_AMBULATORY_CARE_PROVIDER_SITE_OTHER): Payer: Medicaid Other | Admitting: Emergency Medicine

## 2013-08-15 VITALS — BP 113/76 | HR 85 | Temp 98.4°F | Ht 63.0 in | Wt 158.0 lb

## 2013-08-15 DIAGNOSIS — M25569 Pain in unspecified knee: Secondary | ICD-10-CM

## 2013-08-15 DIAGNOSIS — M25562 Pain in left knee: Secondary | ICD-10-CM

## 2013-08-15 MED ORDER — HYDROCODONE-ACETAMINOPHEN 5-325 MG PO TABS: 1.0000 | ORAL_TABLET | Freq: Four times a day (QID) | ORAL | Status: DC | PRN

## 2013-08-15 MED ORDER — CELECOXIB 100 MG PO CAPS: 100.0000 mg | ORAL_CAPSULE | Freq: Two times a day (BID) | ORAL | Status: DC

## 2013-08-15 NOTE — Progress Notes (Signed)
   Subjective:    Patient ID: Leah Baker, female    [name]: 1980-11-14, 33 y.o.   MRN: [id number]  HPI AMERIS AKAMINE is here for urgent care f/u.  She was seen at urgent care last Friday for left knee pain.  She reports that on Friday, she stepped out of her car funny and had immediate pain and swelling in her left knee.  She was seen at urgent care and diagnosed with likely medial meniscal tear.  She received a steroid injection at that time.  Today, she reports continued pain - described as burning and crunchy.  She is able to bear weight on the leg.  States the swelling has improved with ice and elevation.  Has not been able to tolerate any NSAIDs secondary to GI upset.  She also states she feels like the knee is shifting or sliding sometimes.  Reports popping and crunching, but no locking.   She states that she injured the knee playing basketball in high school, and reports another injury about 2 weeks ago when she stepped off a curb wrong.  Current Outpatient Prescriptions on File Prior to Visit  Medication Sig Dispense Refill  . traMADol (ULTRAM) 50 MG tablet Take 1 tablet (50 mg total) by mouth every 6 (six) hours as needed.  15 tablet  0  . ferrous sulfate 325 (65 FE) MG tablet Take 1 tablet (325 mg total) by mouth 2 (two) times daily with a meal.  60 tablet  3  . FLUoxetine (PROZAC) 20 MG capsule Take 1 capsule (20 mg total) by mouth daily.  30 capsule  2  . medroxyPROGESTERone (PROVERA) 10 MG tablet Take 1 tablet (10 mg total) by mouth daily.  10 tablet  0   No current facility-administered medications on file prior to visit.    I have reviewed and updated the following as appropriate: allergies and current medications SHx: former smoker   Review of Systems See HPI    Objective:   Physical Exam BP 113/76  Pulse 85  Temp(Src) 98.4 F (36.9 C) (Oral)  Ht 5\' 3"  (1.6 m)  Wt 158 lb (71.668 kg)  BMI 28.00 kg/m2  LMP 07/19/2013 Gen: alert, cooperative, NAD, walks with  limp Left knee: soft tissue swelling over medial aspect, minimal joint effusion; no erythema or warmth; tender over medial joint line only; +McMurrays, normal anterior drawer, some mild laxity of posterior drawer with reproduction of "sliding" sensation; LCL intact; MCL with slight laxity     Assessment & Plan:

## 2013-08-15 NOTE — Assessment & Plan Note (Signed)
Swelling improved. Concerned for meniscal injury.  Also some concern for partial tear of PCL and/or MCL. MRI ordered. Celebrex 100mg  BID x1 week. Continue ice and elevation. Recommended brace to help with the sliding sensation. Norco for pain. Referral to orthopedics placed. F/u in 2-4 weeks if not improving.

## 2013-08-15 NOTE — Patient Instructions (Signed)
It was nice to see you! I'm sorry about your knee.  We are going to work on scheduling an MRI of the knee. I will also work on getting you in to see an orthopedic doctor.  In the meantime... - Take the celebrex twice a day for the next week, then as needed.  Do not take with ibuprofen, diclofenac or naprosyn. - Use the Vicodin as needed.  Do not drive while on this medicine.  Do not take with Tramadol. - continue to ice and elevated the leg. - get a brace at [facility] to help with the sliding sensation.  Follow up in 2-4 weeks if knee is not improving.

## 2013-08-18 ENCOUNTER — Ambulatory Visit
Admission: RE | Admit: 2013-08-18 | Discharge: 2013-08-18 | Disposition: A | Discharge status: Home / Self Care | Payer: Medicaid Other | Source: Ambulatory Visit | Attending: Family Medicine | Admitting: Family Medicine

## 2013-08-18 DIAGNOSIS — M25562 Pain in left knee: Secondary | ICD-10-CM

## 2013-08-22 ENCOUNTER — Telehealth: Payer: Self-pay | Admitting: Emergency Medicine

## 2013-08-22 NOTE — Telephone Encounter (Signed)
Called and reviewed MRI results with patient.  She has a medical meniscus tear, ACL tear, and PCL degeneration.  She has an appointment scheduled with orthopedics on Thursday.  She purchased a brace for her knee which she states helps some with the stability issues.  Still having significant discomfort, particularly at night.  Discussed that it is okay to take 2 norco at bedtime.  All of her questions were answered.

## 2013-08-25 ENCOUNTER — Telehealth: Payer: Self-pay | Admitting: Emergency Medicine

## 2013-08-25 MED ORDER — OXYCODONE-ACETAMINOPHEN 5-325 MG PO TABS: 1.0000 | ORAL_TABLET | Freq: Three times a day (TID) | ORAL | Status: DC | PRN

## 2013-08-25 NOTE — Telephone Encounter (Signed)
Called and spoke with patient.  She had a frustrating experience at [facility] this morning regarding her knee.  She has been scheduled to see one of the surgeons on Tuesday morning.  However, she continues to have significant trouble resting at night.  She does okay during the day, but her knee aches and swells at night.  I will change her Norco to Percocet and have put a prescription up front for pick up.  Patient expressed appreciation.  Discussed not taking Norco and Percocet together.

## 2013-09-01 ENCOUNTER — Encounter (HOSPITAL_BASED_OUTPATIENT_CLINIC_OR_DEPARTMENT_OTHER): Payer: Self-pay | Admitting: *Deleted

## 2013-09-05 ENCOUNTER — Other Ambulatory Visit: Payer: Self-pay | Admitting: Physician Assistant

## 2013-09-07 NOTE — H&P (Signed)
Leah Baker/[facility] [address] [place], [place] [address] [phone] A Division of [facility]  [name], [name]   [name], [name]   [name], [name]   [name], [name]   [name], [name] [name]. [name], [name]  [name], [name]  [name], [name]    [name], [name] [name], PA-C  [name], PA-C  [name], PA-C [place], [place]  RE: Leah Baker, Leah Baker   [phone]      [name]: Jul 09, 1980 INITIAL EVALUATION: 08-25-13  SUBJECTIVE: Ms. Fasig is a 33 year old female new patient to me whom I see at the request of her physician at the [facility].  She comes in with concerns about her left knee.  She injured her knee in high school playing basketball.  Never sought medical treatment but she never has felt her knee has been right since.  Several weeks ago she stepped off a curb, her knee gave way, and she felt a pop.  She had another episode several weeks ago where her knee twisted and gave way and she has had discreet sense of catching, popping, and locking of the knee since.  She does describe occasional episodes of giving way over the years but not a lot of daily instability or symptoms of instability.  She had seen her primary care physician and was put on Celebrex and Vicodin but has had no improvement with her pain.  She sees me in further evaluation after having undergone an MRI of the left knee.  Upon review of the MRI, there is a large flap tear to the medial meniscus with evidence of an ACL deficient knee. The ACL is torn proximally.  There is some proximal PCL degeneration as well.  Some degenerative changes of the medial patellar facet.    She comes in frustrated with continued pain and catching to the inner side of her knee.   Her Medical History upon review otherwise is unremarkable.  She has no allergies.  No history of diabetes.  She is on no  chronic medications. Past Surgical History is unremarkable.   Family History is negative upon review.   Review of systems is significant for depression after the death of her mother several years ago.   She is a nonsmoker, single, who currently is a full time Ship broker.  OBJECTIVE: 5'3" , 158 pounds, blood pressure 114/80.  She can fully weight bear with the left lower extremity with a subtle limp.  Exam of the left knee shows a positive Lachman.  I cannot elicit a pivot slip.  She has discreet tenderness at the medial joint line.  Positive McMurry's, stable varus and valgus stress.  No effusion.  She is neurovascularly intact distally.     X-RAYS: None repeated.  Negative by review.  MRI upon review shows an ACL deficient knee with a medial meniscus tear.     Continued----- Leah Baker/[facility] [address] [place], [place] [address] [phone] A Division of [facility]  [name], [name]   [name], [name]   [name], [name]   [name], [name]   [name], [name] [name]. [name], [name]  [name], [name]  [name], [name]    [name], [name] [name], PA-C  [name], PA-C  Josh Atglen, PA-C [place],  OPA-C  RE: Leah Baker, Leah Baker   [phone]      [name]: Mar 12, 1981 INITIAL EVALUATION: 08-25-13 Page 2   ASSESSMENT: 1. ACL deficient left knee. 2. Medial meniscus tear.  PLAN: I had a lengthy discussion with Leah Baker as to her knee dilemma.  Literature is provided in the midst of our discussion.  I discussed with her symptoms of instability and her having an ACL deficient knee relating back to her high school basketball injury.  If she wants definitive intervention in light of her feeling of instability and giving way, she would require an ACL reconstruction with associated attention to her current new meniscus tear.  That being said, we discussed the merits of  knee arthroscopy, not addressing her ACL with consideration of functional bracing and physical therapy.  She is not sure how she wants to proceed although with her worsening pain and ongoing level of functional disability at a minimum she is interested in knee arthroscopy with attention to the new acute meniscus tear.  To that end, crutches are provided.  Norco 10/325 #30 for short term acute pain.  Surgical consultation with Dr. [name] next week to garner his further  diagnostic and therapeutic recommendations with regards to her knee dilemma, that being knee arthroscopy or ACL reconstruction.    [name], [name]  Electronically verified by [name], [name] JSK:gde D 08-25-13 T 08-26-13  Chrles Selley/[facility] [address] [place], [place] [address] [phone] A Division of [facility]  [name], [name]   [name], [name]   [name], [name]   [name], [name]   [name], M.D [name]. [name], [name]  [name], [name]  [name], [name]    [name], [name] [name], PA-C  [name], PA-C  Josh Calmar, PA-C [place], [place]   RE: Leah Baker, Leah Baker PROGRESS NOTE: 08-30-13 Leah Baker is seen in consultation today at the request of Dr. Alfonso Ramus. History, workup and treatment to date is reviewed.  Previous x-rays, as well as MRI scan from August 18, 2013 reviewed.  Longstanding ACL deficient knee on the left.  A degree of instability since this was injured in high school.  New traumatic injury with a documented medial meniscus tear.  She feels like she is also having more instability since the more recent event.  She presents to discuss definitive treatment.  Her degree of meniscus tear is to a point that this needs to be addressed, the main question being whether or not we should address her chronic ACL instability.  She is 33 years old and wants to remain active.   The instability she has had prior to this event has definitely had an impact on her degree of being able to participate in all activity.  Her MRI shows what appears to be an acute on chronic ACL tear.  I think whatever she had left of her ACL is now gone.  Some mild degenerative changes, but nothing marked.  Large displaced fragment tear medial meniscus.   Remaining history and general exam is reviewed, updated and included in the chart.    EXAMINATION: Specifically, healthy appearing 33 year-old female.  She still has an antalgic gait on the left and does not like going into full extension.  ACL is deficient.  Positive McMurray's.  PCL and other ligaments are stable.  Neurovascularly intact distally.  Her swelling is definitely better    X-RAYS: I  did get a four view standing x-ray today and happy to report that I am not seeing any significant degenerative change, despite her history of injury in the past.    DISPOSITION:  We have discussed definitive treatment.  We talked about two options.  After thorough discussion I don't think she or I will be happy with just addressing her meniscus tear and leaving her unstable.  Elected to proceed with definitive treatment of both problems.  Exam under anesthesia, arthroscopy and partial medial meniscectomy.  An anterior tib allograft, arthroscopic endoscopic reconstruction.  Procedure, risks, benefits and complications reviewed in detail.  All paperwork complete.  All questions answered.  More than 25 minutes spent with her face-to-face, almost all of this in regards to counseling of injury and treatment.  I will see her at the time of operative intervention.    [name], [name]   Electronically verified by [name], [name] DFM:jjh D 08-30-13 T 08-31-13

## 2013-09-08 ENCOUNTER — Ambulatory Visit (HOSPITAL_BASED_OUTPATIENT_CLINIC_OR_DEPARTMENT_OTHER): Payer: Medicaid Other | Admitting: Anesthesiology

## 2013-09-08 ENCOUNTER — Ambulatory Visit (HOSPITAL_BASED_OUTPATIENT_CLINIC_OR_DEPARTMENT_OTHER)
Admission: RE | Admit: 2013-09-08 | Discharge: 2013-09-09 | Disposition: A | Discharge status: Home / Self Care | Payer: Medicaid Other | Source: Ambulatory Visit | Attending: Orthopedic Surgery | Admitting: Orthopedic Surgery

## 2013-09-08 ENCOUNTER — Encounter (HOSPITAL_BASED_OUTPATIENT_CLINIC_OR_DEPARTMENT_OTHER): Payer: Medicaid Other | Admitting: Anesthesiology

## 2013-09-08 ENCOUNTER — Encounter (HOSPITAL_BASED_OUTPATIENT_CLINIC_OR_DEPARTMENT_OTHER): Payer: Self-pay

## 2013-09-08 ENCOUNTER — Encounter (HOSPITAL_BASED_OUTPATIENT_CLINIC_OR_DEPARTMENT_OTHER)
Admission: RE | Disposition: A | Discharge status: Home / Self Care | Payer: Self-pay | Source: Ambulatory Visit | Attending: Orthopedic Surgery

## 2013-09-08 DIAGNOSIS — S83509A Sprain of unspecified cruciate ligament of unspecified knee, initial encounter: Secondary | ICD-10-CM | POA: Insufficient documentation

## 2013-09-08 DIAGNOSIS — IMO0002 Reserved for concepts with insufficient information to code with codable children: Secondary | ICD-10-CM | POA: Insufficient documentation

## 2013-09-08 DIAGNOSIS — S83512A Sprain of anterior cruciate ligament of left knee, initial encounter: Secondary | ICD-10-CM

## 2013-09-08 DIAGNOSIS — S83207A Unspecified tear of unspecified meniscus, current injury, left knee, initial encounter: Secondary | ICD-10-CM

## 2013-09-08 DIAGNOSIS — Z87891 Personal history of nicotine dependence: Secondary | ICD-10-CM | POA: Insufficient documentation

## 2013-09-08 DIAGNOSIS — S83289A Other tear of lateral meniscus, current injury, unspecified knee, initial encounter: Secondary | ICD-10-CM | POA: Insufficient documentation

## 2013-09-08 DIAGNOSIS — M2340 Loose body in knee, unspecified knee: Secondary | ICD-10-CM | POA: Insufficient documentation

## 2013-09-08 DIAGNOSIS — F32A Depression, unspecified: Secondary | ICD-10-CM

## 2013-09-08 DIAGNOSIS — F329 Major depressive disorder, single episode, unspecified: Secondary | ICD-10-CM

## 2013-09-08 DIAGNOSIS — W101XXA Fall (on)(from) sidewalk curb, initial encounter: Secondary | ICD-10-CM | POA: Insufficient documentation

## 2013-09-08 DIAGNOSIS — M2350 Chronic instability of knee, unspecified knee: Secondary | ICD-10-CM | POA: Insufficient documentation

## 2013-09-08 HISTORY — DX: Sprain of anterior cruciate ligament of unspecified knee, initial encounter: S83.519A

## 2013-09-08 HISTORY — DX: Other tear of medial meniscus, current injury, unspecified knee, initial encounter: S83.249A

## 2013-09-08 LAB — POCT HEMOGLOBIN-HEMACUE: HEMOGLOBIN: 11.1 g/dL — AB (ref 12.0–15.0)

## 2013-09-08 SURGERY — KNEE ARTHROSCOPY WITH ANTERIOR CRUCIATE LIGAMENT (ACL) REPAIR WITH ANTERIOR TIBILIAS GRAFT
Anesthesia: General | Site: Knee | Laterality: Left

## 2013-09-08 MED ORDER — FENTANYL CITRATE 0.05 MG/ML IJ SOLN
INTRAMUSCULAR | Status: AC
  Filled 2013-09-08: qty 2

## 2013-09-08 MED ORDER — METOCLOPRAMIDE HCL 5 MG PO TABS: 5.0000 mg | ORAL_TABLET | Freq: Three times a day (TID) | ORAL | Status: DC | PRN

## 2013-09-08 MED ORDER — BISACODYL 5 MG PO TBEC: 5.0000 mg | DELAYED_RELEASE_TABLET | Freq: Every day | ORAL | Status: DC | PRN

## 2013-09-08 MED ORDER — ONDANSETRON HCL 4 MG/2ML IJ SOLN
4.0000 mg | Freq: Once | INTRAMUSCULAR | Status: AC | PRN
  Filled 2013-09-08: qty 2

## 2013-09-08 MED ORDER — FERROUS SULFATE 325 (65 FE) MG PO TABS: 325.0000 mg | ORAL_TABLET | Freq: Two times a day (BID) | ORAL | Status: DC

## 2013-09-08 MED ORDER — ONDANSETRON HCL 4 MG PO TABS: 4.0000 mg | ORAL_TABLET | Freq: Four times a day (QID) | ORAL | Status: DC | PRN

## 2013-09-08 MED ORDER — METOCLOPRAMIDE HCL 5 MG/ML IJ SOLN: 5.0000 mg | Freq: Three times a day (TID) | INTRAMUSCULAR | Status: DC | PRN

## 2013-09-08 MED ORDER — OXYCODONE HCL 5 MG/5ML PO SOLN: 5.0000 mg | Freq: Once | ORAL | Status: AC | PRN

## 2013-09-08 MED ORDER — MORPHINE SULFATE 10 MG/ML IJ SOLN
INTRAMUSCULAR | Status: DC | PRN
  Administered 2013-09-08 (×3): 2 mg via INTRAVENOUS

## 2013-09-08 MED ORDER — OXYCODONE HCL 5 MG PO TABS: 5.0000 mg | ORAL_TABLET | Freq: Once | ORAL | Status: AC | PRN

## 2013-09-08 MED ORDER — CEFAZOLIN SODIUM 1-5 GM-% IV SOLN
1.0000 g | Freq: Four times a day (QID) | INTRAVENOUS | Status: DC
  Administered 2013-09-08 – 2013-09-09 (×2): 1 g via INTRAVENOUS

## 2013-09-08 MED ORDER — SODIUM CHLORIDE 0.9 % IR SOLN
Status: DC | PRN
  Administered 2013-09-08: 12000 mL

## 2013-09-08 MED ORDER — FLUOXETINE HCL 20 MG PO CAPS: 20.0000 mg | ORAL_CAPSULE | Freq: Every day | ORAL | Status: DC

## 2013-09-08 MED ORDER — OXYCODONE-ACETAMINOPHEN 5-325 MG PO TABS
1.0000 | ORAL_TABLET | ORAL | Status: DC | PRN
Start: 2013-09-08 — End: 2013-09-09
  Administered 2013-09-08 – 2013-09-09 (×4): 2 via ORAL
  Filled 2013-09-08 (×4): qty 2

## 2013-09-08 MED ORDER — ESMOLOL HCL 10 MG/ML IV SOLN
INTRAVENOUS | Status: DC | PRN
  Administered 2013-09-08 (×3): 20 mg via INTRAVENOUS

## 2013-09-08 MED ORDER — BUPIVACAINE-EPINEPHRINE PF 0.5-1:200000 % IJ SOLN
INTRAMUSCULAR | Status: DC | PRN
  Administered 2013-09-08: 22 mL via PERINEURAL

## 2013-09-08 MED ORDER — ONDANSETRON HCL 4 MG/2ML IJ SOLN
INTRAMUSCULAR | Status: DC | PRN
  Administered 2013-09-08: 4 mg via INTRAVENOUS

## 2013-09-08 MED ORDER — HYDROMORPHONE HCL PF 1 MG/ML IJ SOLN: 0.5000 mg | INTRAMUSCULAR | Status: DC | PRN

## 2013-09-08 MED ORDER — FENTANYL CITRATE 0.05 MG/ML IJ SOLN
50.0000 ug | INTRAMUSCULAR | Status: DC | PRN
  Administered 2013-09-08: 100 ug via INTRAVENOUS

## 2013-09-08 MED ORDER — CEFAZOLIN SODIUM 1-5 GM-% IV SOLN
INTRAVENOUS | Status: AC
  Filled 2013-09-08: qty 100

## 2013-09-08 MED ORDER — MIDAZOLAM HCL 2 MG/ML PO SYRP: 12.0000 mg | ORAL_SOLUTION | Freq: Once | ORAL | Status: DC | PRN

## 2013-09-08 MED ORDER — CEFAZOLIN SODIUM-DEXTROSE 2-3 GM-% IV SOLR
INTRAVENOUS | Status: AC
  Filled 2013-09-08: qty 50

## 2013-09-08 MED ORDER — METHOCARBAMOL 500 MG PO TABS
500.0000 mg | ORAL_TABLET | Freq: Four times a day (QID) | ORAL | Status: DC | PRN
  Administered 2013-09-08 (×2): 500 mg via ORAL
  Filled 2013-09-08 (×3): qty 1

## 2013-09-08 MED ORDER — HYDROMORPHONE HCL PF 1 MG/ML IJ SOLN
INTRAMUSCULAR | Status: AC
  Filled 2013-09-08: qty 1

## 2013-09-08 MED ORDER — MORPHINE SULFATE 10 MG/ML IJ SOLN
INTRAMUSCULAR | Status: AC
  Filled 2013-09-08: qty 1

## 2013-09-08 MED ORDER — OXYCODONE-ACETAMINOPHEN 5-325 MG PO TABS: 1.0000 | ORAL_TABLET | ORAL | Status: DC | PRN

## 2013-09-08 MED ORDER — LACTATED RINGERS IV SOLN
INTRAVENOUS | Status: DC
Start: 2013-09-08 — End: 2013-09-08

## 2013-09-08 MED ORDER — PROPOFOL 10 MG/ML IV BOLUS
INTRAVENOUS | Status: DC | PRN
  Administered 2013-09-08: 200 mg via INTRAVENOUS

## 2013-09-08 MED ORDER — CHLORHEXIDINE GLUCONATE 4 % EX LIQD: 60.0000 mL | Freq: Once | CUTANEOUS | Status: DC

## 2013-09-08 MED ORDER — HYDROMORPHONE HCL PF 1 MG/ML IJ SOLN
0.2500 mg | INTRAMUSCULAR | Status: DC | PRN
  Administered 2013-09-08 (×2): 0.5 mg via INTRAVENOUS

## 2013-09-08 MED ORDER — OXYCODONE-ACETAMINOPHEN 5-325 MG PO TABS
1.0000 | ORAL_TABLET | ORAL | Status: DC | PRN
  Administered 2013-09-09: 2 via ORAL
  Filled 2013-09-08: qty 2

## 2013-09-08 MED ORDER — METHOCARBAMOL 500 MG PO TABS
500.0000 mg | ORAL_TABLET | Freq: Four times a day (QID) | ORAL | Status: DC | PRN
  Administered 2013-09-09: 500 mg via ORAL

## 2013-09-08 MED ORDER — MIDAZOLAM HCL 2 MG/2ML IJ SOLN
1.0000 mg | INTRAMUSCULAR | Status: DC | PRN
  Administered 2013-09-08: 2 mg via INTRAVENOUS

## 2013-09-08 MED ORDER — METHOCARBAMOL 100 MG/ML IJ SOLN: 500.0000 mg | Freq: Four times a day (QID) | INTRAVENOUS | Status: DC | PRN

## 2013-09-08 MED ORDER — ONDANSETRON HCL 4 MG/2ML IJ SOLN
4.0000 mg | Freq: Four times a day (QID) | INTRAMUSCULAR | Status: DC | PRN
  Administered 2013-09-08: 4 mg via INTRAVENOUS

## 2013-09-08 MED ORDER — SODIUM CHLORIDE 0.9 % IV SOLN
INTRAVENOUS | Status: DC
  Administered 2013-09-08: 75 mL/h via INTRAVENOUS

## 2013-09-08 MED ORDER — DOCUSATE SODIUM 100 MG PO CAPS
100.0000 mg | ORAL_CAPSULE | Freq: Two times a day (BID) | ORAL | Status: DC
  Administered 2013-09-08: 100 mg via ORAL
  Filled 2013-09-08: qty 1

## 2013-09-08 MED ORDER — MIDAZOLAM HCL 2 MG/2ML IJ SOLN
INTRAMUSCULAR | Status: AC
Start: 2013-09-08 — End: 2013-09-08
  Filled 2013-09-08: qty 2

## 2013-09-08 MED ORDER — ONDANSETRON HCL 4 MG/2ML IJ SOLN: 4.0000 mg | Freq: Four times a day (QID) | INTRAMUSCULAR | Status: DC | PRN

## 2013-09-08 MED ORDER — DEXAMETHASONE SODIUM PHOSPHATE 4 MG/ML IJ SOLN
INTRAMUSCULAR | Status: DC | PRN
  Administered 2013-09-08: 10 mg via INTRAVENOUS

## 2013-09-08 MED ORDER — CEFAZOLIN SODIUM-DEXTROSE 2-3 GM-% IV SOLR
2.0000 g | INTRAVENOUS | Status: AC
  Administered 2013-09-08: 2 g via INTRAVENOUS

## 2013-09-08 MED ORDER — LACTATED RINGERS IV SOLN
INTRAVENOUS | Status: DC
  Administered 2013-09-08: 07:00:00 via INTRAVENOUS

## 2013-09-08 MED ORDER — LIDOCAINE HCL (CARDIAC) 20 MG/ML IV SOLN
INTRAVENOUS | Status: DC | PRN
  Administered 2013-09-08: 80 mg via INTRAVENOUS

## 2013-09-08 MED ORDER — ONDANSETRON HCL 4 MG PO TABS: 4.0000 mg | ORAL_TABLET | Freq: Three times a day (TID) | ORAL | Status: DC | PRN

## 2013-09-08 SURGICAL SUPPLY — 84 items
ANCH SUT PUSHLCK 19.5X3.5 STRL (Anchor) ×1 IMPLANT
ANCHOR BUTTON TIGHTROPE ACL RT (Orthopedic Implant) ×2 IMPLANT
ANCHOR PUSHLOCK PEEK 3.5X19.5 (Anchor) ×2 IMPLANT
APL SKNCLS STERI-STRIP NONHPOA (GAUZE/BANDAGES/DRESSINGS) ×1
BANDAGE ELASTIC 6 VELCRO ST LF (GAUZE/BANDAGES/DRESSINGS) ×2 IMPLANT
BANDAGE ESMARK 6X9 LF (GAUZE/BANDAGES/DRESSINGS) ×1 IMPLANT
BENZOIN TINCTURE PRP APPL 2/3 (GAUZE/BANDAGES/DRESSINGS) ×3 IMPLANT
BLADE 4.2CUDA (BLADE) IMPLANT
BLADE CUDA 5.5 (BLADE) IMPLANT
BLADE CUDA GRT WHITE 3.5 (BLADE) IMPLANT
BLADE CUTTER GATOR 3.5 (BLADE) ×3 IMPLANT
BLADE CUTTER MENIS 5.5 (BLADE) IMPLANT
BLADE GREAT WHITE 4.2 (BLADE) ×2 IMPLANT
BLADE GREAT WHITE 4.2MM (BLADE) ×1
BLADE SURG 15 STRL LF DISP TIS (BLADE) ×1 IMPLANT
BLADE SURG 15 STRL SS (BLADE) ×3
BNDG CMPR 9X6 STRL LF SNTH (GAUZE/BANDAGES/DRESSINGS) ×1
BNDG ESMARK 6X9 LF (GAUZE/BANDAGES/DRESSINGS) ×3
BUR OVAL 6.0 (BURR) ×3 IMPLANT
CANISTER SUCT 3000ML (MISCELLANEOUS) IMPLANT
CLOSURE WOUND 1/2 X4 (GAUZE/BANDAGES/DRESSINGS) ×1
COVER TABLE BACK 60X90 (DRAPES) ×3 IMPLANT
CUFF TOURNIQUET SINGLE 34IN LL (TOURNIQUET CUFF) ×2 IMPLANT
CUTTER MENISCUS  4.2MM (BLADE)
CUTTER MENISCUS 4.2MM (BLADE) IMPLANT
DECANTER SPIKE VIAL GLASS SM (MISCELLANEOUS) IMPLANT
DRAPE ARTHROSCOPY W/POUCH 114 (DRAPES) ×3 IMPLANT
DRAPE OEC MINIVIEW 54X84 (DRAPES) ×3 IMPLANT
DRAPE U-SHAPE 47X51 STRL (DRAPES) ×3 IMPLANT
DURAPREP 26ML APPLICATOR (WOUND CARE) ×3 IMPLANT
ELECT MENISCUS 165MM 90D (ELECTRODE) IMPLANT
ELECT REM PT RETURN 9FT ADLT (ELECTROSURGICAL) ×3
ELECTRODE REM PT RTRN 9FT ADLT (ELECTROSURGICAL) ×1 IMPLANT
GAUZE XEROFORM 1X8 LF (GAUZE/BANDAGES/DRESSINGS) ×3 IMPLANT
GLOVE BIO SURGEON STRL SZ 6.5 (GLOVE) ×1 IMPLANT
GLOVE BIO SURGEONS STRL SZ 6.5 (GLOVE) ×1
GLOVE BIOGEL PI IND STRL 7.0 (GLOVE) ×1 IMPLANT
GLOVE BIOGEL PI INDICATOR 7.0 (GLOVE) ×4
GLOVE ECLIPSE 6.5 STRL STRAW (GLOVE) ×3 IMPLANT
GLOVE EXAM NITRILE PF MED BLUE (GLOVE) ×2 IMPLANT
GLOVE ORTHO TXT STRL SZ7.5 (GLOVE) ×4 IMPLANT
GOWN STRL REUS W/ TWL LRG LVL3 (GOWN DISPOSABLE) ×2 IMPLANT
GOWN STRL REUS W/ TWL XL LVL3 (GOWN DISPOSABLE) ×1 IMPLANT
GOWN STRL REUS W/TWL LRG LVL3 (GOWN DISPOSABLE) ×9
GOWN STRL REUS W/TWL XL LVL3 (GOWN DISPOSABLE)
GRAFT TISS ANT TIB TNDN (Tissue) IMPLANT
IMMOBILIZER KNEE 22 UNIV (SOFTGOODS) ×3 IMPLANT
IMMOBILIZER KNEE 24 THIGH 36 (MISCELLANEOUS) ×1 IMPLANT
IMMOBILIZER KNEE 24 UNIV (MISCELLANEOUS)
IV NS IRRIG 3000ML ARTHROMATIC (IV SOLUTION) ×12 IMPLANT
KNEE WRAP E Z 3 GEL PACK (MISCELLANEOUS) ×3 IMPLANT
MANIFOLD NEPTUNE II (INSTRUMENTS) ×3 IMPLANT
NEEDLE MENISCAL REPAIR DBL ARM (NEEDLE) IMPLANT
NS IRRIG 1000ML POUR BTL (IV SOLUTION) ×3 IMPLANT
PACK ARTHROSCOPY DSU (CUSTOM PROCEDURE TRAY) ×3 IMPLANT
PACK BASIN DAY SURGERY FS (CUSTOM PROCEDURE TRAY) ×3 IMPLANT
PAD CAST 4YDX4 CTTN HI CHSV (CAST SUPPLIES) ×1 IMPLANT
PADDING CAST COTTON 4X4 STRL (CAST SUPPLIES)
PADDING CAST COTTON 6X4 STRL (CAST SUPPLIES) ×3 IMPLANT
PENCIL BUTTON HOLSTER BLD 10FT (ELECTRODE) IMPLANT
PIN DRILL ACL TIGHTROPE 4MM (PIN) ×2 IMPLANT
SCREW BIO INTER 8X23 (Screw) ×2 IMPLANT
SET ARTHROSCOPY TUBING (MISCELLANEOUS) ×3
SET ARTHROSCOPY TUBING LN (MISCELLANEOUS) ×1 IMPLANT
SLEEVE SCD COMPRESS KNEE MED (MISCELLANEOUS) ×2 IMPLANT
SPONGE GAUZE 4X4 12PLY (GAUZE/BANDAGES/DRESSINGS) ×6 IMPLANT
SPONGE LAP 4X18 X RAY DECT (DISPOSABLE) ×3 IMPLANT
STRIP CLOSURE SKIN 1/2X4 (GAUZE/BANDAGES/DRESSINGS) ×2 IMPLANT
SUCTION FRAZIER TIP 10 FR DISP (SUCTIONS) IMPLANT
SUT 2 FIBERLOOP 20 STRT BLUE (SUTURE) ×3
SUT ETHILON 3 0 PS 1 (SUTURE) ×3 IMPLANT
SUT FIBERWIRE #2 38 T-5 BLUE (SUTURE)
SUT MNCRL AB 3-0 PS2 18 (SUTURE) ×3 IMPLANT
SUT VIC AB 2-0 SH 27 (SUTURE) ×3
SUT VIC AB 2-0 SH 27XBRD (SUTURE) ×1 IMPLANT
SUT VIC AB 3-0 SH 27 (SUTURE)
SUT VIC AB 3-0 SH 27X BRD (SUTURE) IMPLANT
SUT VICRYL 4-0 PS2 18IN ABS (SUTURE) IMPLANT
SUTURE 2 FIBERLOOP 20 STRT BLU (SUTURE) ×1 IMPLANT
SUTURE FIBERWR #2 38 T-5 BLUE (SUTURE) IMPLANT
TENDON ANTERIOR TIBIALIS (Tissue) ×3 IMPLANT
TOWEL OR 17X24 6PK STRL BLUE (TOWEL DISPOSABLE) ×4 IMPLANT
TOWEL OR NON WOVEN STRL DISP B (DISPOSABLE) ×3 IMPLANT
WATER STERILE IRR 1000ML POUR (IV SOLUTION) ×3 IMPLANT

## 2013-09-08 NOTE — Anesthesia Procedure Notes (Addendum)
Anesthesia Procedure Image  Anesthesia Regional Block:  Femoral nerve block  Pre-Anesthetic Checklist: ,, timeout performed, Correct Patient, Correct Site, Correct Laterality, Correct Procedure, Correct Position, site marked, Risks and benefits discussed,  Surgical consent,  Pre-op evaluation,  At surgeon's request and post-op pain management  Laterality: Left and Lower  Prep: chloraprep       Needles:  Injection technique: Single-shot  Needle Type: Echogenic Needle     Needle Length: 9cm 9 cm Needle Gauge: 21 and 21 G    Additional Needles:  Procedures: ultrasound guided (picture in chart) Femoral nerve block Narrative:  Start time: 09/08/2013 7:15 AM End time: 09/08/2013 7:20 AM Injection made incrementally with aspirations every 5 mL.  Performed by: Personally  Anesthesiologist: Lorrene Reid, MD

## 2013-09-08 NOTE — Progress Notes (Signed)
Assisted Dr. Al Corpus with left, ultrasound guided, femoral nerve block. Side rails up, monitors on throughout procedure. See vital signs in flow sheet. Tolerated Procedure well.

## 2013-09-08 NOTE — Anesthesia Preprocedure Evaluation (Signed)
Anesthesia Evaluation  Patient identified by MRN, date of birth, ID band Patient awake    Reviewed: Allergy & Precautions, H&P , NPO status , Patient's Chart, lab work & pertinent test results  History of Anesthesia Complications (+) PONV  Airway Mallampati: I TM Distance: >3 FB Neck ROM: Full    Dental  (+) Teeth Intact, Dental Advisory Given   Pulmonary former smoker,  breath sounds clear to auscultation        Cardiovascular Rhythm:Regular Rate:Normal     Neuro/Psych    GI/Hepatic   Endo/Other    Renal/GU      Musculoskeletal   Abdominal   Peds  Hematology   Anesthesia Other Findings   Reproductive/Obstetrics                           Anesthesia Physical Anesthesia Plan  ASA: I  Anesthesia Plan: General   Post-op Pain Management:    Induction: Intravenous  Airway Management Planned: LMA  Additional Equipment:   Intra-op Plan:   Post-operative Plan: Extubation in OR  Informed Consent: I have reviewed the patients History and Physical, chart, labs and discussed the procedure including the risks, benefits and alternatives for the proposed anesthesia with the patient or authorized representative who has indicated his/her understanding and acceptance.   Dental advisory given  Plan Discussed with: CRNA, Anesthesiologist and Surgeon  Anesthesia Plan Comments:         Anesthesia Quick Evaluation

## 2013-09-08 NOTE — Discharge Instructions (Signed)
Anterior Cruciate Ligament Reconstruction Care After Refer to this sheet in the next few weeks. These instructions provide you with information on caring for yourself after your procedure. Your caregiver may also give you specific instructions. Your treatment has been planned according to current medical practices, but problems sometimes occur. Call your caregiver if you have any problems or questions after your procedure. HOME CARE INSTRUCTIONS  Weight bearing as tolerated but must be in knee immobilizer at all times unless using CPM machine.  May change dressing on Sunday.  May shower on Tuesday, but do not let incision get wet.  May apply ice for up to 20 minutes at a time for pain and swelling.  Follow up appointment in our office in one week.  To ease pain and swelling, apply ice to your knee, twice per day, for 2 3 days after your procedure:  Put ice in a plastic bag.  Place a towel between your skin and the bag.  Leave the ice on for 15 minutes.  Change dressings as directed.  Take over-the-counter or prescription medicines for pain, discomfort, or fever only as directed by your caregiver.  Use crutches or braces or splints as directed by your caregiver.  Do not exercise your leg unless instructed to do so by your caregiver. SEEK IMMEDIATE MEDICAL CARE IF:   You develop increased redness, swelling, or pain around your incision sites.  You have increased pain with movement of your knee.  You have a marked increase in swelling around your knee.  You have a lot of pain in your leg when you move your foot up and down at your ankle.  There is pus or any unusual drainage coming from your incision sites.  You develop a fever.  You notice a bad smell coming from your incision sites.  Any of your incisions break open (edges do not stay together) after sutures or staples have been removed. MAKE SURE YOU:  Understand these instructions.  Will watch your condition.  Will get  help right away if you are not doing well or get worse. Document Released: 12/06/2004 Document Revised: 02/11/2012 Document Reviewed: 10/12/2011 [facility] Patient Information 2014 [place].  Post Anesthesia Home Care Instructions  Activity: Get plenty of rest for the remainder of the day. A responsible adult should stay with you for 24 hours following the procedure.  For the next 24 hours, DO NOT: -Drive a car -Paediatric nurse -Drink alcoholic beverages -Take any medication unless instructed by your physician -Make any legal decisions or sign important papers.  Meals: Start with liquid foods such as gelatin or soup. Progress to regular foods as tolerated. Avoid greasy, spicy, heavy foods. If nausea and/or vomiting occur, drink only clear liquids until the nausea and/or vomiting subsides. Call your physician if vomiting continues.  Special Instructions/Symptoms: Your throat may feel dry or sore from the anesthesia or the breathing tube placed in your throat during surgery. If this causes discomfort, gargle with warm salt water. The discomfort should disappear within 24 hours.   Regional Anesthesia Blocks  1. Numbness or the inability to move the "blocked" extremity may last from 3-48 hours after placement. The length of time depends on the medication injected and your individual response to the medication. If the numbness is not going away after 48 hours, call your surgeon.  2. The extremity that is blocked will need to be protected until the numbness is gone and the  Strength has returned. Because you cannot feel it, you will  need to take extra care to avoid injury. Because it may be weak, you may have difficulty moving it or using it. You may not know what position it is in without looking at it while the block is in effect.  3. For blocks in the legs and feet, returning to weight bearing and walking needs to be done carefully. You will need to wait until the numbness is entirely  gone and the strength has returned. You should be able to move your leg and foot normally before you try and bear weight or walk. You will need someone to be with you when you first try to ensure you do not fall and possibly risk injury.  4. Bruising and tenderness at the needle site are common side effects and will resolve in a few days.  5. Persistent numbness or new problems with movement should be communicated to the surgeon or the [facility] [phone] [place] [phone]).

## 2013-09-08 NOTE — Interval H&P Note (Signed)
History and Physical Interval Note:  09/08/2013 7:28 AM  Leah Baker  has presented today for surgery, with the diagnosis of LEFT KNEE TEAR MENISCUS MEDIAL KNEE,SPRAIN/STRAIN/TEAR CRUCIATE LIGAMENT  The various methods of treatment have been discussed with the patient and family. After consideration of risks, benefits and other options for treatment, the patient has consented to  Procedure(s): LEFT KNEE ARTHROSCOPY WITH MEDIAL MENISECTOMY ANTERIOR CRUCIATE LIGAMENT REPAIR (Left) as a surgical intervention .  The patient's history has been reviewed, patient examined, no change in status, stable for surgery.  I have reviewed the patient's chart and labs.  Questions were answered to the patient's satisfaction.     Leah Baker

## 2013-09-08 NOTE — Anesthesia Postprocedure Evaluation (Signed)
  Anesthesia Post-op Note  Patient: Leah Baker  Procedure(s) Performed: Procedure(s): LEFT KNEE ARTHROSCOPY WITH MEDIAL AND LATERAL MENISCECTOMY, REMOVAL LOOSE BODY, ANTERIOR CRUCIATE LIGAMENT REPAIR (Left)  Patient Location: PACU  Anesthesia Type:GA combined with regional for post-op pain  Level of Consciousness: awake, alert  and oriented  Airway and Oxygen Therapy: Patient Spontanous Breathing  Post-op Pain: mild  Post-op Assessment: Post-op Vital signs reviewed  Post-op Vital Signs: Reviewed  Last Vitals:  Filed Vitals:   09/08/13 1000  BP: 117/72  Pulse: 88  Temp:   Resp: 8    Complications: No apparent anesthesia complications

## 2013-09-08 NOTE — Transfer of Care (Signed)
Immediate Anesthesia Transfer of Care Note  Patient: EMIYAH SPRAGGINS  Procedure(s) Performed: Procedure(s): LEFT KNEE ARTHROSCOPY WITH MEDIAL AND LATERAL MENISCECTOMY, REMOVAL LOOSE BODY, ANTERIOR CRUCIATE LIGAMENT REPAIR (Left)  Patient Location: PACU  Anesthesia Type:General  Level of Consciousness: awake, alert  and oriented  Airway & Oxygen Therapy: Patient Spontanous Breathing and Patient connected to face mask oxygen  Post-op Assessment: Report given to PACU RN and Post -op Vital signs reviewed and stable  Post vital signs: Reviewed and stable  Complications: No apparent anesthesia complications

## 2013-09-09 NOTE — Op Note (Signed)
NAMEANNER, BAITY NO.:  [id number]  MEDICAL RECORD NO.:  [id number]  LOCATION:                               FACILITY:  [facility]  PHYSICIAN:  [name], [name] DATE OF BIRTH:  [birth year]  DATE OF PROCEDURE:  09/08/2013 DATE OF DISCHARGE:  09/08/2013                              OPERATIVE REPORT   PREOPERATIVE DIAGNOSIS:  Left knee acute on chronic anterior cruciate ligament tear with anterolateral rotatory instability.  Medial meniscus tear.  POSTOPERATIVE DIAGNOSES:  Left knee acute on chronic anterior cruciate ligament tear with anterolateral rotatory instability.  Medial meniscus tear with reparable complex tearing medial meniscus.  Also radial tearing of lateral meniscus.  Also, a soft tissue meniscal loose body found in the lateral compartment.  PROCEDURE:  Left knee exam under anesthesia, arthroscopy.  Partial medial and lateral meniscectomy.  Removal of loose body.  Arthroscopic and endoscopic anterior cruciate ligament reconstruction utilizing anterior tib allograft.  Tight rope above with interference screw and push lock distally.  Notchplasty.  SURGEON:  [name], [name]  ASSISTANT:  Doran Stabler PA-C, present throughout the entire case and necessary for timely completion of the procedure.  ANESTHESIA:  General.  BLOOD LOSS:  Minimal.  SPECIMENS:  None.  CULTURES:  None.  COMPLICATIONS:  None.  DRESSINGS:  Soft compressive knee immobilizer.  TOURNIQUET TIME:  1 hour and 10 minutes.  PROCEDURE:  The patient was brought to the operating room, placed on the operating table in supine position.  After adequate anesthesia had been obtained, knee examined.  Positive Lachman, drawer, pivot shift.  Other ligament stable.  Tourniquet applied.  Prepped and draped in usual sterile fashion.  Exsanguinated with elevation of Esmarch.  Tourniquet inflated to 350 mmHg.  Two portals, one each medial and lateral parapatellar.  Arthroscope  was introduced.  Knee distended and inspected.  Good articular cartilage throughout.  Good patellar tracking.  Acute on chronic ACL tear with some remaining fibers from her new injury, but nothing functional.  Debrided.  Notch opened with notchplasty.  Medial compartment, medial meniscus tear, bucket-handle torn in half with ball of fragments on both ends.  All the loose tissue removed.  Salvaged a little bit of the posterior horn tip remaining meniscus.  That compartment otherwise looked good.  Laterally radial tearing throughout the lateral meniscus that was saucerized out and tapered in smoothly.  Some partial tearing on the top of the back debrided.  I found a 4-5 mm loose body in the posterolateral aspect that appeared to be a meniscal fragments on the medial side that was removed with a grasping forceps.  A anterior tibial artery was prepared for 9-mm tunnels.  With appropriate hardware for the tightrope above and fixation distally.  A small incision next to tibial tubercle.  Guidewire from there up to the footprint of the ACL.  Overdrilled with a 9-mm reamer. Debris cleared with a shaver.  Femoral guide was inserted across tibial tunnel notch on the back cortex of femur.  K-wire driven, overdrilled with a reamer for appropriate depth of the graft.  Debris cleared throughout.  These tunnels assessed on either position.  The 2 pin Passer  inserted across both tunnels out through a stab wound on the thigh.  The graft was then attached to that and pulled in across the knee with the tightrope button going up in the femur.  With the fluoroscopic guidance, this was exited through the femur then seated down well on the femur.  Suture advanced pointing the graft out nicely into the femoral tunnel.  Fluoroscopy used to confirm good position of that.  The graft was then tensioned to 70 degrees and fixed with interference screw in the tibial tunnel distally.  Exiting sutures were then further  anchored down into a push lock anchor.  At completion, nice stability.  Good clearance of graft throughout.  Wounds were all irrigated.  Subcutaneous and subcuticular closure.  Portals were closed with nylon.  A sterile compressive dressing applied.  Knee immobilizer applied.  Anesthesia reversed.  Brought to the recovery room.  Tolerated the surgery well.  No complications.     [name], [name]     DFM/MEDQ  D:  09/08/2013  T:  09/08/2013  Job:  [id number]

## 2013-09-10 ENCOUNTER — Telehealth: Payer: Self-pay | Admitting: Family Medicine

## 2013-09-10 NOTE — Telephone Encounter (Signed)
Received call on the emergency line. Pt reports she had knee surgery yesterday by Dr. [name] and has been in significant pain since then, and is taking oxycodone 1-2 pills every 4 hours as needed (per prescription / instructions), but has only one dose left from a Rx filled 2 days prior to surgery (#20). Her pharmacy would not fill the Rx she got yesterday (#60); she states they told her "you have a 7 day supply at home already." She denies fever, increased swelling, N/V, bleeding or drainage from the wound, but reports her pain is significant and she feels unable to get by without pain medication.  Advised pt that I cannot prescribe or give a new Rx for narcotics over the phone. It seems odd that her pharmacy would not honor a new valid Rx, but I advised her to call or go to the [facility] clinic (they should be open from 10-2 today) to see if their providers can help. Otherwise gave her red flags to watch for and instructions on when to contact her surgeon from a potential complication standpoint. Otherwise advised her to go to regular urgent care or the ED if her pain increases or is not controlled, or if she is unable to otherwise figure out pain management or get her Rx filled. I suggested she try a different pharmacy, potentially, as well. Pt voiced understanding.  Emmaline Kluver, MD PGY-2, [facility] 09/10/2013, 8:17 AM

## 2013-09-29 ENCOUNTER — Ambulatory Visit: Payer: Medicaid Other | Admitting: Occupational Therapy

## 2013-10-04 ENCOUNTER — Ambulatory Visit: Payer: Medicaid Other | Attending: Orthopedic Surgery

## 2013-10-04 DIAGNOSIS — R262 Difficulty in walking, not elsewhere classified: Secondary | ICD-10-CM | POA: Insufficient documentation

## 2013-10-04 DIAGNOSIS — IMO0001 Reserved for inherently not codable concepts without codable children: Secondary | ICD-10-CM | POA: Insufficient documentation

## 2013-10-04 DIAGNOSIS — M25569 Pain in unspecified knee: Secondary | ICD-10-CM | POA: Insufficient documentation

## 2013-10-25 ENCOUNTER — Telehealth: Payer: Self-pay | Admitting: Emergency Medicine

## 2013-10-25 NOTE — Telephone Encounter (Signed)
Left message for patient to return call. Please ask her if she has a particular Dr or practice she prefers and what it is she needs a second opinion on.Jaymes Graff Busick

## 2013-10-25 NOTE — Telephone Encounter (Signed)
Pt called and would like a second opinion from another ortho doctor. jw

## 2013-10-25 NOTE — Telephone Encounter (Signed)
Pt called to give her new phone number [phone] jw

## 2013-10-31 NOTE — Telephone Encounter (Signed)
Pt never called back. Leah Baker

## 2013-11-16 ENCOUNTER — Encounter: Payer: Self-pay | Admitting: Emergency Medicine

## 2013-11-16 ENCOUNTER — Ambulatory Visit (INDEPENDENT_AMBULATORY_CARE_PROVIDER_SITE_OTHER): Payer: Medicaid Other | Admitting: Emergency Medicine

## 2013-11-16 VITALS — BP 105/73 | HR 87 | Temp 98.1°F | Ht 63.0 in | Wt 147.5 lb

## 2013-11-16 DIAGNOSIS — S83207A Unspecified tear of unspecified meniscus, current injury, left knee, initial encounter: Secondary | ICD-10-CM

## 2013-11-16 DIAGNOSIS — S83512A Sprain of anterior cruciate ligament of left knee, initial encounter: Principal | ICD-10-CM

## 2013-11-16 DIAGNOSIS — S83509A Sprain of unspecified cruciate ligament of unspecified knee, initial encounter: Secondary | ICD-10-CM

## 2013-11-16 DIAGNOSIS — IMO0002 Reserved for concepts with insufficient information to code with codable children: Secondary | ICD-10-CM

## 2013-11-16 MED ORDER — MELOXICAM 15 MG PO TABS: 15.0000 mg | ORAL_TABLET | Freq: Every day | ORAL | Status: DC

## 2013-11-16 NOTE — Assessment & Plan Note (Signed)
Provided reassurance to the patient that her knee is stable at this point. We also discussed that her left knee will never be the same as her right knee. She will likely to continue to have some mild aches and pains, however these should be controlled with occasional ibuprofen and Tylenol. She is currently on Norco from the orthopedic office. I have changed her anti-inflammatory to meloxicam 15 mg daily. Discussed that she needs to stop all ibuprofen, Naprosyn, Aleve, Advil while on this medicine. I think physical therapy would be very beneficial for her. I have placed an additional order for physical therapy to see if this will help get her scheduled. I also discussed continuing to do gentle range of motion exercises. Recommended starting straight leg raises in all 4 directions. Followup as needed.

## 2013-11-16 NOTE — Patient Instructions (Signed)
It was nice to see you! I'm sorry there's been so much trouble getting you into physical therapy.  I put in a PT referral to see if that will help.  I also sent in a prescription for meloxicam.  Take 1 pill daily.  Do not take any ibuprofen, aleve, advil, naprosyn with this medicine.  Follow up as needed.

## 2013-11-16 NOTE — Progress Notes (Signed)
   Subjective:    Patient ID: Leah Baker, female    [name]: 19-Aug-1980, 33 y.o.   MRN: [id number]  HPI Leah Baker is here for followup left knee surgery.  She had surgery on her left knee in April 2015 for torn meniscus and anterior cruciate ligament. She has been very frustrated since the surgery. She continues to feel like her knee is unstable, particularly going up and down stairs. She also continues to have pain, requiring Norco. She has pain with both full extension and flexion. She has been doing some straight leg raises at home, as well as range of motion exercises. She has not been able to go to physical therapy. She did go for an initial evaluation, and her Medicaid approved for physical therapy, but she has not been able to schedule any appointments. She is also taking ibuprofen 800 mg 4 times a day.  Current Outpatient Prescriptions on File Prior to Visit  Medication Sig Dispense Refill  . bisacodyl (DULCOLAX) 5 MG EC tablet Take 1 tablet (5 mg total) by mouth daily as needed for moderate constipation.  30 tablet  0  . ferrous sulfate 325 (65 FE) MG tablet Take 325 mg by mouth daily with breakfast.      . FLUoxetine (PROZAC) 20 MG capsule Take 1 capsule (20 mg total) by mouth daily.  30 capsule  2  . ondansetron (ZOFRAN) 4 MG tablet Take 1 tablet (4 mg total) by mouth every 8 (eight) hours as needed for nausea or vomiting.  40 tablet  0   No current facility-administered medications on file prior to visit.    I have reviewed and updated the following as appropriate: allergies and current medications SHx: former smoker  Review of Systems See HPI    Objective:   Physical Exam BP 105/73  Pulse 87  Temp(Src) 98.1 F (36.7 C) (Oral)  Ht 5\' 3"  (1.6 m)  Wt 147 lb 8 oz (66.906 kg)  BMI 26.14 kg/m2  LMP 11/11/2013 Gen: alert, cooperative, NAD Left knee: wearing immobilizer; slightly warm with medial subcutaneous swelling, no joint effusion appreciated; tender along the  medial and lateral joint lines; MCL and LCL without laxity; ACL and PCL also stable although not quite the same as on the right     Assessment & Plan:

## 2013-11-30 ENCOUNTER — Ambulatory Visit: Payer: Medicaid Other | Attending: Orthopedic Surgery | Admitting: Physical Therapy

## 2013-11-30 DIAGNOSIS — R262 Difficulty in walking, not elsewhere classified: Secondary | ICD-10-CM | POA: Insufficient documentation

## 2013-11-30 DIAGNOSIS — M25569 Pain in unspecified knee: Secondary | ICD-10-CM | POA: Insufficient documentation

## 2013-11-30 DIAGNOSIS — IMO0001 Reserved for inherently not codable concepts without codable children: Secondary | ICD-10-CM | POA: Insufficient documentation

## 2013-12-07 ENCOUNTER — Encounter: Payer: Medicaid Other | Admitting: Physical Therapy

## 2013-12-12 ENCOUNTER — Ambulatory Visit: Payer: Medicaid Other | Admitting: Physical Therapy

## 2013-12-14 ENCOUNTER — Encounter: Payer: Medicaid Other | Admitting: Physical Therapy

## 2014-01-02 ENCOUNTER — Ambulatory Visit (INDEPENDENT_AMBULATORY_CARE_PROVIDER_SITE_OTHER): Payer: Medicaid Other | Admitting: Family Medicine

## 2014-01-02 ENCOUNTER — Encounter: Payer: Self-pay | Admitting: Family Medicine

## 2014-01-02 VITALS — BP 127/89 | HR 90 | Temp 98.1°F | Wt 143.0 lb

## 2014-01-02 DIAGNOSIS — Y33XXXS Other specified events, undetermined intent, sequela: Secondary | ICD-10-CM

## 2014-01-02 DIAGNOSIS — S83207S Unspecified tear of unspecified meniscus, current injury, left knee, sequela: Secondary | ICD-10-CM

## 2014-01-02 DIAGNOSIS — M25562 Pain in left knee: Secondary | ICD-10-CM

## 2014-01-02 DIAGNOSIS — M25569 Pain in unspecified knee: Secondary | ICD-10-CM

## 2014-01-02 DIAGNOSIS — Z0000 Encounter for general adult medical examination without abnormal findings: Secondary | ICD-10-CM

## 2014-01-02 DIAGNOSIS — IMO0002 Reserved for concepts with insufficient information to code with codable children: Secondary | ICD-10-CM

## 2014-01-02 DIAGNOSIS — S83512S Sprain of anterior cruciate ligament of left knee, sequela: Secondary | ICD-10-CM

## 2014-01-02 MED ORDER — TRAMADOL HCL 50 MG PO TABS
50.0000 mg | ORAL_TABLET | Freq: Three times a day (TID) | ORAL | Status: DC | PRN
Start: 2014-01-02 — End: 2014-01-13

## 2014-01-02 NOTE — Progress Notes (Signed)
Patient ID: Leah Baker, female   [name]: 06-30-1980, 33 y.o.   MRN: [id number]  Kenn File, MD Phone: [phone]  Subjective:  Chief complaint-noted  Pt Here for knee pain.  Patient states that in March she had knee pain for which she pursued an MRI 4. She had tears of the meniscus and anterior cruciate ligament of the left knee at that time and had a reconstructive left knee surgery by orthopedic surgery. She's had continued pain since that time and states that the pain is actually worse than it was before. She describes deep seated throbbing pain that keeps her awake at night. She states that she recently saw her repeat surgeon whom she feels like insulted her, so she would like a referral to a different orthopedic surgeon She was referred to physical therapy and when she showed up for her appointment after her initial evaluation, about 2 months later, she was told that her initial referral was expired and that she needed a new referral. She would like any PT referral  Healthcare maintenance Due for Pap smear States that she has a strong family history of breast cancer, her mother, grandmother, and great-grandmother all had breast cancer and ovarian cancer. She states that her mother had breast cancer in her 79s. She denies any concerning nipple discharge or lumps at this time. Up until the idea of a genetics consult for BRCA testing.  ROS-  Per history of present illness  Past Medical History Patient Active Problem List   Diagnosis Date Noted  . Healthcare maintenance 01/02/2014  . Tears of meniscus and ACL of left knee 09/08/2013  . Right shoulder pain 05/19/2013  . Left knee pain 05/19/2013  . Abnormal uterine bleeding 03/11/2013  . Dysmenorrhea 10/19/2011  . Depression 05/14/2011  . H/O: cesarean section 08/28/2010  . Alcohol abuse 08/25/2010  . Marijuana abuse 08/25/2010  . TOBACCO USER 03/10/2009    Medications- reviewed and updated Current Outpatient  Prescriptions  Medication Sig Dispense Refill  . bisacodyl (DULCOLAX) 5 MG EC tablet Take 1 tablet (5 mg total) by mouth daily as needed for moderate constipation.  30 tablet  0  . ferrous sulfate 325 (65 FE) MG tablet Take 325 mg by mouth daily with breakfast.      . FLUoxetine (PROZAC) 20 MG capsule Take 1 capsule (20 mg total) by mouth daily.  30 capsule  2  . HYDROcodone-acetaminophen (NORCO/VICODIN) 5-325 MG per tablet Take 1 tablet by mouth every 6 (six) hours as needed for moderate pain.      . meloxicam (MOBIC) 15 MG tablet Take 1 tablet (15 mg total) by mouth daily.  30 tablet  2  . ondansetron (ZOFRAN) 4 MG tablet Take 1 tablet (4 mg total) by mouth every 8 (eight) hours as needed for nausea or vomiting.  40 tablet  0  . traMADol (ULTRAM) 50 MG tablet Take 1 tablet (50 mg total) by mouth every 8 (eight) hours as needed.  30 tablet  0   No current facility-administered medications for this visit.    Objective: BP 127/89  Pulse 90  Temp(Src) 98.1 F (36.7 C) (Oral)  Wt 143 lb (64.864 kg) Gen: NAD, alert, cooperative with exam HEENT: NCAT CV: RRR, good S1/S2, no murmur Resp: CTABL, no wheezes, non-labored Ext: Left knee with brace present Neuro: Alert and oriented, No gross deficits   Assessment/Plan:  Tears of meniscus and ACL of left knee Still with pain more severe than you would expect given far out  she is for surgery. Started her on tramadol She requests referral to different orthopedic surgeon due to es with her last one. Discussed appropriate Advil as and recommend taking it with food Physical therapy referral written today. She has not daily in the physical therapy due to the previous referral expiring.   Healthcare maintenance Pap smear do, she will schedule this Strong history of breast cancer, states that her mother, grandmother, and great-grandmother had breast and ovarian cancer. She believes her mother had it at the age of 6. I think BRCA testing would  probably be appropriate, refer to genetics for this discussion and further discussion of possible MEN syndromes.  Plan breast exam next visit, no current inserting lumps, would have very low threshold for diagnostic or screening mammo.    Orders Placed This Encounter  Procedures  . Ambulatory referral to Physical Therapy    Referral Priority:  Routine    Referral Type:  Physical Medicine    Referral Reason:  Specialty Services Required    Requested Specialty:  Physical Therapy    Number of Visits Requested:  1  . Ambulatory referral to Orthopedic Surgery    Referral Priority:  Routine    Referral Type:  Surgical    Referral Reason:  Specialty Services Required    Requested Specialty:  Orthopedic Surgery    Number of Visits Requested:  1  . Ambulatory referral to Genetics    Referral Priority:  Routine    Referral Type:  Consultation    Referral Reason:  Specialty Services Required    Number of Visits Requested:  1    Meds ordered this encounter  Medications  . traMADol (ULTRAM) 50 MG tablet    Sig: Take 1 tablet (50 mg total) by mouth every 8 (eight) hours as needed.    Dispense:  30 tablet    Refill:  0

## 2014-01-02 NOTE — Assessment & Plan Note (Signed)
Pap smear do, she will schedule this Strong history of breast cancer, states that her mother, grandmother, and great-grandmother had breast and ovarian cancer. She believes her mother had it at the age of 71. I think BRCA testing would probably be appropriate, refer to genetics for this discussion and further discussion of possible MEN syndromes.  Plan breast exam next visit, no current inserting lumps, would have very low threshold for diagnostic or screening mammo.

## 2014-01-02 NOTE — Patient Instructions (Signed)
Great to see you!  You should be called about PT and your ortho referral.   Try the tramadol, make an appt with me  In the next 4-6 weeks for a pap and breast exam.

## 2014-01-02 NOTE — Assessment & Plan Note (Signed)
Still with pain more severe than you would expect given far out she is for surgery. Started her on tramadol She requests referral to different orthopedic surgeon due to es with her last one. Discussed appropriate Advil as and recommend taking it with food Physical therapy referral written today. She has not daily in the physical therapy due to the previous referral expiring.

## 2014-01-13 ENCOUNTER — Telehealth: Payer: Self-pay | Admitting: Family Medicine

## 2014-01-13 DIAGNOSIS — M25562 Pain in left knee: Secondary | ICD-10-CM

## 2014-01-13 MED ORDER — TRAMADOL HCL 50 MG PO TABS: 50.0000 mg | ORAL_TABLET | Freq: Three times a day (TID) | ORAL | Status: DC | PRN

## 2014-01-13 NOTE — Telephone Encounter (Signed)
Refilled tramadol. Will Rx 90 per month so she may use up to 1 tid PRN. She will need appts for refills.   Laroy Apple, MD [place] Resident, PGY-3 01/13/2014, 2:36 PM

## 2014-01-13 NOTE — Telephone Encounter (Signed)
Pt informed. Said she will schedule appt. [place], [place] CMA

## 2014-01-13 NOTE — Telephone Encounter (Signed)
Pt called and would like a refill on her Tramadol left up front since this seems to be working for her knee. jw

## 2014-01-17 ENCOUNTER — Ambulatory Visit: Payer: Medicaid Other | Attending: Orthopedic Surgery | Admitting: Physical Therapy

## 2014-01-17 DIAGNOSIS — M25569 Pain in unspecified knee: Secondary | ICD-10-CM | POA: Insufficient documentation

## 2014-01-17 DIAGNOSIS — IMO0001 Reserved for inherently not codable concepts without codable children: Secondary | ICD-10-CM | POA: Diagnosis not present

## 2014-01-17 DIAGNOSIS — R262 Difficulty in walking, not elsewhere classified: Secondary | ICD-10-CM | POA: Insufficient documentation

## 2014-01-31 ENCOUNTER — Ambulatory Visit: Payer: Medicaid Other | Attending: Orthopedic Surgery | Admitting: Physical Therapy

## 2014-01-31 DIAGNOSIS — M25569 Pain in unspecified knee: Secondary | ICD-10-CM | POA: Diagnosis not present

## 2014-01-31 DIAGNOSIS — IMO0001 Reserved for inherently not codable concepts without codable children: Secondary | ICD-10-CM | POA: Diagnosis not present

## 2014-01-31 DIAGNOSIS — R262 Difficulty in walking, not elsewhere classified: Secondary | ICD-10-CM | POA: Insufficient documentation

## 2014-02-01 ENCOUNTER — Ambulatory Visit: Payer: Medicaid Other | Admitting: Family Medicine

## 2014-02-07 ENCOUNTER — Encounter: Payer: Medicaid Other | Admitting: Physical Therapy

## 2014-02-14 ENCOUNTER — Ambulatory Visit: Payer: Medicaid Other | Admitting: Physical Therapy

## 2014-02-14 DIAGNOSIS — IMO0001 Reserved for inherently not codable concepts without codable children: Secondary | ICD-10-CM | POA: Diagnosis not present

## 2014-02-20 ENCOUNTER — Ambulatory Visit (INDEPENDENT_AMBULATORY_CARE_PROVIDER_SITE_OTHER): Payer: Medicaid Other | Admitting: Family Medicine

## 2014-02-20 ENCOUNTER — Encounter: Payer: Self-pay | Admitting: Family Medicine

## 2014-02-20 VITALS — BP 103/66 | HR 82 | Temp 97.9°F | Ht 63.0 in | Wt 139.7 lb

## 2014-02-20 DIAGNOSIS — F3289 Other specified depressive episodes: Secondary | ICD-10-CM

## 2014-02-20 DIAGNOSIS — F329 Major depressive disorder, single episode, unspecified: Secondary | ICD-10-CM

## 2014-02-20 DIAGNOSIS — F39 Unspecified mood [affective] disorder: Secondary | ICD-10-CM | POA: Insufficient documentation

## 2014-02-20 DIAGNOSIS — Y33XXXS Other specified events, undetermined intent, sequela: Secondary | ICD-10-CM

## 2014-02-20 DIAGNOSIS — F32A Depression, unspecified: Secondary | ICD-10-CM

## 2014-02-20 DIAGNOSIS — IMO0002 Reserved for concepts with insufficient information to code with codable children: Secondary | ICD-10-CM

## 2014-02-20 DIAGNOSIS — S83207S Unspecified tear of unspecified meniscus, current injury, left knee, sequela: Secondary | ICD-10-CM

## 2014-02-20 DIAGNOSIS — S83512S Sprain of anterior cruciate ligament of left knee, sequela: Secondary | ICD-10-CM

## 2014-02-20 MED ORDER — TRAMADOL HCL 50 MG PO TABS: 50.0000 mg | ORAL_TABLET | Freq: Three times a day (TID) | ORAL | Status: DC | PRN

## 2014-02-20 MED ORDER — FLUOXETINE HCL 20 MG PO TABS: 20.0000 mg | ORAL_TABLET | Freq: Every day | ORAL | Status: DC

## 2014-02-20 NOTE — Patient Instructions (Signed)
Great to see you!  Please make a follow up appt for 2 weeks.

## 2014-02-20 NOTE — Assessment & Plan Note (Signed)
Pain improved, now taking 2 in am an d2 at night Difficulty sleeping seems to be due to mood Follow up ion ortho referral, will ask staff to assisst Rx given for tramadol 50 mg #120, R2 Follow up 2 -3 months

## 2014-02-20 NOTE — Assessment & Plan Note (Addendum)
Appears to be struggling with depression again Start prozac No SI PHQ-9 score 21 and extremely difficult, GAD score 19 and extremely diff F/u 2 weeks
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
## 2014-03-06 ENCOUNTER — Ambulatory Visit: Payer: Medicaid Other | Admitting: Family Medicine

## 2014-03-07 ENCOUNTER — Telehealth: Payer: Self-pay | Admitting: *Deleted

## 2014-03-07 NOTE — Telephone Encounter (Signed)
Called Santiago Glad, she then gave me another number ([phone]) to set up referral. Left message on voicemail of Arsenio Loader, awaiting callback. FYI to PCP

## 2014-03-07 NOTE — Telephone Encounter (Signed)
Message copied by Nathaniel Man on Tue Mar 07, 2014  3:25 PM ------      Message from: Timmothy Euler      Created: Fri Mar 03, 2014 12:05 PM      Regarding: FW: Genetic Appointment      Contact: [phone]             Hey guys, I'm trying to get her in to see genetics for BRCA 1 testing. Do you mind setting it up with Roma Kayser like wanda said?             Thanks!      Sam            ----- Message -----         From: Skeet Latch         Sent: 03/02/2014  10:51 AM           To: Timmothy Euler, MD      Subject: RE: Genetic Appointment                                  Sorry Wendi Snipes for my delayed response it's been crazy here.       This should be referred to Roma Kayser at the [facility] [place]. Any patients with a family history of cancer should be referred to her. Her number is [phone]. If you like to speak with Dr. Abelina Bachelor regarding this patient her pager # is [phone].            Thanks,      Mariann Laster            ----- Message -----         From: Timmothy Euler, MD         Sent: 02/22/2014  12:17 PM           To: Skeet Latch      Subject: RE: Genetic Appointment                                  Geronimo Boot,             I sent her b/c of a very strong fam Hx of breast and ovarian cancer. I felt you guys would probably want to do BRCA testing and would consider MEN syndrome.             Thanks!      Sam      ----- Message -----         From: Skeet Latch         Sent: 02/22/2014  11:55 AM           To: Timmothy Euler, MD      Subject: Genetic Appointment                                      Hi Dr. Wendi Snipes,            I saw a genetic referral for Ms Scheerer and needed more information as to why you feel she needs to see Dr. Abelina Bachelor. V70.0 (ICD-9-CM) - Healthcare maintenance is all that was listed.            Feel  free to give me a call.            Thanks,      Mariann Laster                   ------

## 2014-03-09 ENCOUNTER — Telehealth: Payer: Self-pay | Admitting: *Deleted

## 2014-03-09 NOTE — Telephone Encounter (Signed)
Received a message from the [facility] requesting for a genetic appt.  Called and left a message for the pt to return my call so I can schedule a genetic appt.

## 2014-03-13 ENCOUNTER — Telehealth: Payer: Self-pay | Admitting: *Deleted

## 2014-03-13 NOTE — Telephone Encounter (Signed)
Called and left a message for the pt to return my call so I can schedule a genetic appt.

## 2014-03-13 NOTE — Telephone Encounter (Signed)
Pt returned my call and I confirmed 03/23/14 genetic appt w/ her.  Called referring and left a message to make them aware.

## 2014-03-23 ENCOUNTER — Other Ambulatory Visit: Payer: Medicaid Other

## 2014-03-31 ENCOUNTER — Ambulatory Visit: Payer: Medicaid Other | Admitting: Family Medicine

## 2014-04-03 ENCOUNTER — Encounter: Payer: Self-pay | Admitting: Family Medicine

## 2014-04-17 ENCOUNTER — Encounter: Payer: Medicaid Other | Admitting: Family Medicine

## 2014-04-18 NOTE — Progress Notes (Signed)
This encounter was created in error - please disregard.

## 2014-05-03 ENCOUNTER — Encounter: Payer: Self-pay | Admitting: Family Medicine

## 2014-05-03 ENCOUNTER — Ambulatory Visit (INDEPENDENT_AMBULATORY_CARE_PROVIDER_SITE_OTHER): Payer: Medicaid Other | Admitting: Family Medicine

## 2014-05-03 VITALS — BP 104/71 | HR 102 | Temp 98.1°F | Ht 63.0 in | Wt 142.4 lb

## 2014-05-03 DIAGNOSIS — N939 Abnormal uterine and vaginal bleeding, unspecified: Secondary | ICD-10-CM

## 2014-05-03 DIAGNOSIS — S83512S Sprain of anterior cruciate ligament of left knee, sequela: Secondary | ICD-10-CM

## 2014-05-03 DIAGNOSIS — S83207S Unspecified tear of unspecified meniscus, current injury, left knee, sequela: Secondary | ICD-10-CM

## 2014-05-03 DIAGNOSIS — N946 Dysmenorrhea, unspecified: Secondary | ICD-10-CM

## 2014-05-03 LAB — BASIC METABOLIC PANEL
BUN: 10 mg/dL (ref 6–23)
CO2: 29 mEq/L (ref 19–32)
CREATININE: 0.83 mg/dL (ref 0.50–1.10)
Calcium: 9.4 mg/dL (ref 8.4–10.5)
Chloride: 106 mEq/L (ref 96–112)
GLUCOSE: 80 mg/dL (ref 70–99)
Potassium: 3.9 mEq/L (ref 3.5–5.3)
Sodium: 139 mEq/L (ref 135–145)

## 2014-05-03 LAB — CBC WITH DIFFERENTIAL/PLATELET
Basophils Absolute: 0 10*3/uL (ref 0.0–0.1)
Basophils Relative: 0 % (ref 0–1)
Eosinophils Absolute: 0.3 10*3/uL (ref 0.0–0.7)
Eosinophils Relative: 4 % (ref 0–5)
HCT: 31.8 % — ABNORMAL LOW (ref 36.0–46.0)
Hemoglobin: 10.6 g/dL — ABNORMAL LOW (ref 12.0–15.0)
LYMPHS PCT: 58 % — AB (ref 12–46)
Lymphs Abs: 3.7 10*3/uL (ref 0.7–4.0)
MCH: 27.8 pg (ref 26.0–34.0)
MCHC: 33.3 g/dL (ref 30.0–36.0)
MCV: 83.5 fL (ref 78.0–100.0)
MONO ABS: 0.5 10*3/uL (ref 0.1–1.0)
MPV: 8.4 fL — AB (ref 9.4–12.4)
Monocytes Relative: 8 % (ref 3–12)
NEUTROS ABS: 1.9 10*3/uL (ref 1.7–7.7)
Neutrophils Relative %: 30 % — ABNORMAL LOW (ref 43–77)
Platelets: 358 10*3/uL (ref 150–400)
RBC: 3.81 MIL/uL — ABNORMAL LOW (ref 3.87–5.11)
RDW: 14.4 % (ref 11.5–15.5)
WBC: 6.3 10*3/uL (ref 4.0–10.5)

## 2014-05-03 LAB — TSH: TSH: 1.804 u[IU]/mL (ref 0.350–4.500)

## 2014-05-03 NOTE — Patient Instructions (Signed)
Great to see you again!  i am sorry about your recent struggles, I will contact yo uabout your labs  You should hear something about your referrals in the next few days, if you haven't heard in 1 week call us

## 2014-05-03 NOTE — Assessment & Plan Note (Signed)
Pain continued in her knee,  Muscle spasms seem to be worsening follow-up on orthopedic referral for second opinion , nursing will investigate

## 2014-05-03 NOTE — Assessment & Plan Note (Signed)
Bleeding resolved, anovulatory cycles  has failed NSAIDs, OCPs, Depo-Medrol  Has had an IUD which implanted in the wall of her uterus and had to be removed surgically  previous exam and workup  Did not radially in etiology, TSH was normal,  pelvic ultrasound was normal  considering her repeated episodes and her age with failing several reasonable treatments will refer her to GYN for evaluation and management.

## 2014-05-03 NOTE — Progress Notes (Signed)
Patient ID: Leah Baker, female   [name]: 04-08-1981, 33 y.o.   MRN: [id number]   HPI  Patient presents today for  Follow-up knee pain and menorrhagia   menorrhagia Patient states that she's had   Irregular periods for several years. She did have regular periods when she was teenager and does not have any family history of bleeding disorders  She states that she just finished a. Which she bled for literally one month using approximate 6 tampons daily and had large clots at this time.  previously she has tried diclofenac which she could not tolerate due to GI issues, OCPs did not help , Megace did not control her bleeding , Provera lightened her bleeding and a previous episode of bleeding  last year she had a similar complaint and was worked up with a TSH CBC and pelvic ultrasound which were all normal.   She's had an IUD previously and states that it implanted into her uterus wall and she had had surgically removed.   knee pain  Left knee pain after surgery due to anterior cruciate ligament tear. She has followed through with physical therapy continues to have pain that she feels is an appropriate after her surgery. She would like a referral for a second opinion.  Smoking status noted ROS: Per HPI  Objective: BP 104/71 mmHg  Pulse 102  Temp(Src) 98.1 F (36.7 C) (Oral)  Ht 5\' 3"  (1.6 m)  Wt 142 lb 6.4 oz (64.592 kg)  BMI 25.23 kg/m2  LMP 03/24/2014 Gen: NAD, alert, cooperative with exam HEENT: NCAT CV: RRR, good S1/S2, no murmur Resp: CTABL, no wheezes, non-labored Ext:  Brace on left knee Neuro: Alert and oriented, No gross deficits  Assessment and plan:  Abnormal uterine bleeding  Bleeding resolved, anovulatory cycles  has failed NSAIDs, OCPs, Depo-Medrol  Has had an IUD which implanted in the wall of her uterus and had to be removed surgically  previous exam and workup  Did not radially in etiology, TSH was normal,  pelvic ultrasound was normal  considering her repeated  episodes and her age with failing several reasonable treatments will refer her to GYN for evaluation and management.  Tears of meniscus and ACL of left knee Pain continued in her knee,  Muscle spasms seem to be worsening follow-up on orthopedic referral for second opinion , nursing will investigate      Orders Placed This Encounter  Procedures  . TSH  . CBC with Differential  . Prolactin  . Basic Metabolic Panel  . Ambulatory referral to Obstetrics / Gynecology    Referral Priority:  Routine    Referral Type:  Consultation    Referral Reason:  Specialty Services Required    Requested Specialty:  Obstetrics and Gynecology    Number of Visits Requested:  1

## 2014-05-04 ENCOUNTER — Telehealth: Payer: Self-pay | Admitting: Family Medicine

## 2014-05-04 ENCOUNTER — Telehealth: Payer: Self-pay

## 2014-05-04 DIAGNOSIS — D509 Iron deficiency anemia, unspecified: Secondary | ICD-10-CM | POA: Insufficient documentation

## 2014-05-04 DIAGNOSIS — D649 Anemia, unspecified: Secondary | ICD-10-CM

## 2014-05-04 LAB — PROLACTIN: Prolactin: 14.4 ng/mL

## 2014-05-04 MED ORDER — FERROUS SULFATE 325 (65 FE) MG PO TABS: 325.0000 mg | ORAL_TABLET | Freq: Every day | ORAL | Status: DC

## 2014-05-04 NOTE — Telephone Encounter (Signed)
Called to discuss labs. No reveaing labs for evaluating her menorrhagia. She has anemia and I will start Fe therapy for this.   Anemia Likely iron deficiency anemia from chronic blood loss 2/2 menorrhagia.  This is not reflected by her MCV, as she is normocytic. I offered ferritin to investigate further but given that it is most likely iron deficiency given her blood loss will go ahead and treat with PO iron.  She now has f/u with OB for eval or menorrhagia.     Laroy Apple, MD [place] Resident, PGY-3 05/04/2014, 2:17 PM

## 2014-05-04 NOTE — Telephone Encounter (Signed)
GAVE PATIENT APPT TIME OF 12/7 AT 9:15AM W/DR Jodi Mourning

## 2014-05-04 NOTE — Assessment & Plan Note (Signed)
Likely iron deficiency anemia from chronic blood loss 2/2 menorrhagia.  This is not reflected by her MCV, as she is normocytic. I offered ferritin to investigate further but given that it is most likely iron deficiency given her blood loss will go ahead and treat with PO iron.  She now has f/u with OB for eval or menorrhagia.

## 2014-05-08 ENCOUNTER — Telehealth: Payer: Self-pay | Admitting: Family Medicine

## 2014-05-08 ENCOUNTER — Ambulatory Visit: Payer: Medicaid Other | Admitting: Obstetrics

## 2014-05-08 DIAGNOSIS — N939 Abnormal uterine and vaginal bleeding, unspecified: Secondary | ICD-10-CM

## 2014-05-08 MED ORDER — MEDROXYPROGESTERONE ACETATE 10 MG PO TABS: 10.0000 mg | ORAL_TABLET | Freq: Every day | ORAL | Status: DC

## 2014-05-08 MED ORDER — IBUPROFEN 800 MG PO TABS: 800.0000 mg | ORAL_TABLET | Freq: Three times a day (TID) | ORAL | Status: DC | PRN

## 2014-05-08 NOTE — Telephone Encounter (Signed)
Returning call  She states that last 2 days she's had very  Heavy vaginal bleeding using a pads per day and still with heavy clots on top of that, also she notes severe cramping.  I recommended a course of Provera, 10 mg 14 days, also 800 mg of Motrin for the next 3-5 days scheduled for her uterine cramps.  She has an appointment rescheduled with OB in 2 weeks.  Shes had Tubal ligation for contraception  Laroy Apple, MD [place] Resident, PGY-3 05/08/2014, 5:21 PM

## 2014-05-08 NOTE — Assessment & Plan Note (Signed)
Heavy bleeding and OB had to re-schedule 14 ds provera, motrin for cramping.

## 2014-05-08 NOTE — Telephone Encounter (Signed)
Pt called because she is still bleeding and having very bad cramps. She had an appointment today 12/7 at [facility], but they canceled because the doctor was not going to be in today. She was hoping that Dr. Wendi Snipes can call something in for her. jw

## 2014-05-10 NOTE — Telephone Encounter (Signed)
Pt called back.  She took 3 pills (2400 mg) last night but it did not help with cramping. She would something stronger Please advise

## 2014-05-10 NOTE — Telephone Encounter (Signed)
Returned call, no answer.   Will try again tomorrow am.   Laroy Apple, MD [place] Resident, PGY-3 05/10/2014, 4:58 PM

## 2014-05-11 NOTE — Telephone Encounter (Signed)
Called back  She is complaining of continued cramping and bleeding. The bleeding has improved but not resolved with provera.   I explained to take only 1 of the ibuprofen i prescribed up to TID.   She is thinking of going to urgent care, I recommended that if she is having unbearable pain that it was understandable and encouraged her to get medical help as she deems necessary.   Laroy Apple, MD [place] Resident, PGY-3 05/11/2014, 12:42 PM

## 2014-05-15 ENCOUNTER — Other Ambulatory Visit: Payer: Self-pay | Admitting: Family Medicine

## 2014-05-15 DIAGNOSIS — S83207S Unspecified tear of unspecified meniscus, current injury, left knee, sequela: Secondary | ICD-10-CM

## 2014-05-15 DIAGNOSIS — S83512S Sprain of anterior cruciate ligament of left knee, sequela: Principal | ICD-10-CM

## 2014-05-15 NOTE — Telephone Encounter (Signed)
Needs refill on tramadol Please notify when ready

## 2014-05-17 MED ORDER — TRAMADOL HCL 50 MG PO TABS: 50.0000 mg | ORAL_TABLET | Freq: Three times a day (TID) | ORAL | Status: DC | PRN

## 2014-05-17 NOTE — Telephone Encounter (Signed)
Refilled tramdol, will ask nursing to inform, this was due at our visit 10 days ago.   Laroy Apple, MD [place] Resident, PGY-3 05/17/2014, 9:35 AM

## 2014-05-17 NOTE — Telephone Encounter (Signed)
Patient informed. 

## 2014-06-17 ENCOUNTER — Encounter (HOSPITAL_COMMUNITY): Payer: Self-pay | Admitting: Emergency Medicine

## 2014-06-17 ENCOUNTER — Emergency Department (INDEPENDENT_AMBULATORY_CARE_PROVIDER_SITE_OTHER): Payer: Medicaid Other

## 2014-06-17 ENCOUNTER — Emergency Department (HOSPITAL_COMMUNITY)
Admission: EM | Admit: 2014-06-17 | Discharge: 2014-06-17 | Disposition: A | Discharge status: Home / Self Care | Payer: Medicaid Other | Source: Home / Self Care | Attending: Emergency Medicine | Admitting: Emergency Medicine

## 2014-06-17 DIAGNOSIS — S83412A Sprain of medial collateral ligament of left knee, initial encounter: Secondary | ICD-10-CM

## 2014-06-17 DIAGNOSIS — M25569 Pain in unspecified knee: Secondary | ICD-10-CM

## 2014-06-17 MED ORDER — OXYCODONE-ACETAMINOPHEN 5-325 MG PO TABS
ORAL_TABLET | ORAL | Status: DC
Start: 2014-06-17 — End: 2014-11-29

## 2014-06-17 MED ORDER — LIDOCAINE HCL (PF) 1 % IJ SOLN
INTRAMUSCULAR | Status: AC
  Filled 2014-06-17: qty 5

## 2014-06-17 MED ORDER — HYDROCODONE-ACETAMINOPHEN 5-325 MG PO TABS
2.0000 | ORAL_TABLET | Freq: Once | ORAL | Status: AC
  Administered 2014-06-17: 2 via ORAL

## 2014-06-17 MED ORDER — CEFTRIAXONE SODIUM 1 G IJ SOLR
INTRAMUSCULAR | Status: AC
  Filled 2014-06-17: qty 10

## 2014-06-17 MED ORDER — HYDROCODONE-ACETAMINOPHEN 5-325 MG PO TABS
ORAL_TABLET | ORAL | Status: AC
  Filled 2014-06-17: qty 2

## 2014-06-17 NOTE — ED Notes (Signed)
Left knee pain.  Reports incident yesterday.  Patient was pulling clothes out of dryer, stepped back and felt pain, grabbing pain.  Patient reports stretching out leg, and was able to continue daily activities.  Throughout the day, pain increased, pain throughout the night, and extreme pain today.  Reports aching, throbbing, spasms in knee.  Has had reconstructive surgery on this knee 08/2013 by dr Maretta Los

## 2014-06-17 NOTE — ED Provider Notes (Signed)
Chief Complaint   Knee Pain   History of Present Illness   Leah Baker is a 34 year old female who had repair of a torn anterior cruciate ligament this past April by Dr. Kathryne Hitch. She also had a torn meniscus and a Baker's cyst. She did well, up until yesterday morning, when she was loading some laundry into the dryer. She reached into the dryer, stepped back, and felt a grabbing sensation in her left knee. The pain has been worsening since then. She is able walk with a limp. She's had some muscle spasms. It seems a little bit swollen.  Review of Systems   Other than as noted above, the patient denies any of the following symptoms: Systemic:  No fevers or chills. Musculoskeletal:  No arthritis, swelling, or joint pain.  Neurological:  No muscular weakness or paresthesias.  [place]   Past medical history, family history, social history, meds, and allergies were reviewed.     Physical Examination   Vital signs:  BP 111/74 mmHg  Pulse 109  Resp 12  SpO2 100%  LMP 06/09/2014 Gen:  Alert and oriented times 3.  In no distress. Musculoskeletal: She has pain to palpation over the medial joint line. The leg has a limited range of motion with just a few degrees of movement.   Unable to perform McMurray's test.  Lachman's test was negative.  Anterior drawer test was negative.   Varus and valgus stress has pain with valgus stress.  Able to do a straight leg raise. Otherwise, all joints had a full a ROM with no swelling, bruising or deformity.  No edema, pulses full. Extremities were warm and pink.  Capillary refill was brisk.  Skin:  Clear, warm and dry.  No rash. Neuro:  Alert and oriented times 3.  Muscle strength was normal.  Sensation was intact to light touch.   Radiology   Dg Knee Complete 4 Views Left  06/17/2014   CLINICAL DATA:  Pt had knee surgery in April with ACL graft. Patient was unloading dryer yesterday and started having pain medial and posterior left knee  EXAM:  LEFT KNEE - COMPLETE 4+ VIEW  COMPARISON:  05/19/2013 and MR dated 08/18/2013.  FINDINGS: Interval changes of an ACL repair with tibial and femoral tunnels. There is lucency in the tibia distal to the tibial anchor. Mild spur formation involving all 3 joint compartments. Minimal effusion.  IMPRESSION: 1. Status post ACL repair with proximal tibial lucency distal to the tibial anchor. Correlation with any interval knee radiographs is recommended to determine if this is a new finding or a chronic change related to the surgery. 2. Mild tricompartmental degenerative changes. 3. Minimal effusion.   Electronically Signed   By: Enrique Sack [name]   On: 06/17/2014 10:31    I reviewed the images independently and personally and concur with the radiologist's findings.    Course in Urgent [place]   The following medications were given:  Medications  HYDROcodone-acetaminophen (NORCO/VICODIN) 5-325 MG per tablet 2 tablet (2 tablets Oral Given 06/17/14 1016)    After consultation with the on-call physician for Dr. [name], she was placed in a knee immobilizer, and will follow-up with Dr. [name] next week.  Assessment   The primary encounter diagnosis was Medial collateral ligament sprain of knee, left, initial encounter. A diagnosis of Knee pain was also pertinent to this visit.  Plan    1.  Meds:  The following meds were prescribed:   Discharge Medication List as  of 06/17/2014 11:04 AM    START taking these medications   Details  oxyCODONE-acetaminophen (PERCOCET) 5-325 MG per tablet 1 to 2 tablets every 6 hours as needed for pain., Print        2.  Patient Education/Counseling:  The patient was given appropriate handouts, self care instructions, and instructed in symptomatic relief, including rest and activity, elevation, application of ice and compression.    3.  Follow up:  The patient was told to follow up here if no better in 3 to 4 days, or sooner if becoming worse in any way, and given  some red flag symptoms such as worsening pain or neurological symptoms which would prompt immediate return.  Follow-up with Dr. [name] next week.     Harden Mo, MD 06/17/14 2117
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
## 2014-06-17 NOTE — Discharge Instructions (Signed)
RICE: Routine Care for Injuries The routine care of many injuries includes Rest, Ice, Compression, and Elevation (RICE). HOME CARE INSTRUCTIONS  Rest is needed to allow your body to heal. Routine activities can usually be resumed when comfortable. Injured tendons and bones can take up to 6 weeks to heal. Tendons are the cord-like structures that attach muscle to bone.  Ice following an injury helps keep the swelling down and reduces pain.  Put ice in a plastic bag.  Place a towel between your skin and the bag.  Leave the ice on for 15-20 minutes, 3-4 times a day, or as directed by your health care provider. Do this while awake, for the first 24 to 48 hours. After that, continue as directed by your caregiver.  Compression helps keep swelling down. It also gives support and helps with discomfort. If an elastic bandage has been applied, it should be removed and reapplied every 3 to 4 hours. It should not be applied tightly, but firmly enough to keep swelling down. Watch fingers or toes for swelling, bluish discoloration, coldness, numbness, or excessive pain. If any of these problems occur, remove the bandage and reapply loosely. Contact your caregiver if these problems continue.  Elevation helps reduce swelling and decreases pain. With extremities, such as the arms, hands, legs, and feet, the injured area should be placed near or above the level of the heart, if possible. SEEK IMMEDIATE MEDICAL CARE IF:  You have persistent pain and swelling.  You develop redness, numbness, or unexpected weakness.  Your symptoms are getting worse rather than improving after several days. These symptoms may indicate that further evaluation or further X-rays are needed. Sometimes, X-rays may not show a small broken bone (fracture) until 1 week or 10 days later. Make a follow-up appointment with your caregiver. Ask when your X-ray results will be ready. Make sure you get your X-ray results. Document Released:  08/31/2000 Document Revised: 05/24/2013 Document Reviewed: 10/18/2010 ExitCare Patient Information 2015 [employer]. This information is not intended to replace advice given to you by your health care provider. Make sure you discuss any questions you have with your health care provider.  

## 2014-06-21 ENCOUNTER — Telehealth: Payer: Self-pay | Admitting: Family Medicine

## 2014-06-21 NOTE — Telephone Encounter (Signed)
Pt called and would like a referral to another Ortho doctor. jw

## 2014-06-21 NOTE — Telephone Encounter (Signed)
Appt. Scheduled with [facility] for 1/26 at 2:15 with Dr. Mayer Camel, patient informed.

## 2014-08-10 ENCOUNTER — Ambulatory Visit: Payer: Medicaid Other | Admitting: Obstetrics

## 2014-10-24 ENCOUNTER — Ambulatory Visit: Payer: Medicaid Other | Admitting: Family Medicine

## 2014-11-29 ENCOUNTER — Telehealth: Payer: Self-pay

## 2014-11-29 ENCOUNTER — Ambulatory Visit (INDEPENDENT_AMBULATORY_CARE_PROVIDER_SITE_OTHER): Payer: Medicaid Other | Admitting: Family Medicine

## 2014-11-29 ENCOUNTER — Encounter: Payer: Self-pay | Admitting: Family Medicine

## 2014-11-29 VITALS — BP 104/67 | HR 77 | Temp 98.2°F | Ht 63.0 in | Wt 152.0 lb

## 2014-11-29 DIAGNOSIS — D649 Anemia, unspecified: Secondary | ICD-10-CM | POA: Diagnosis not present

## 2014-11-29 DIAGNOSIS — N939 Abnormal uterine and vaginal bleeding, unspecified: Secondary | ICD-10-CM | POA: Diagnosis not present

## 2014-11-29 DIAGNOSIS — K649 Unspecified hemorrhoids: Secondary | ICD-10-CM | POA: Diagnosis not present

## 2014-11-29 DIAGNOSIS — F329 Major depressive disorder, single episode, unspecified: Secondary | ICD-10-CM

## 2014-11-29 DIAGNOSIS — F32A Depression, unspecified: Secondary | ICD-10-CM

## 2014-11-29 MED ORDER — FERROUS SULFATE 325 (65 FE) MG PO TBEC: 325.0000 mg | DELAYED_RELEASE_TABLET | Freq: Three times a day (TID) | ORAL | Status: DC

## 2014-11-29 MED ORDER — HYDROCORTISONE 2.5 % RE CREA
1.0000 | TOPICAL_CREAM | Freq: Two times a day (BID) | RECTAL | Status: DC
Start: 2014-11-29 — End: 2018-09-06

## 2014-11-29 NOTE — Assessment & Plan Note (Signed)
New onset hemorhoids Adding iron PO so stressed importance of fiber +/- metamucil anusol rectally

## 2014-11-29 NOTE — Telephone Encounter (Signed)
HOME NUMBER KEPT RINGING ETC, LEFT VM ON MOBILE - NEW PATIENT APPT IS 12/28/14 AT 10AM

## 2014-11-29 NOTE — Patient Instructions (Signed)
Great to see you!  If you don't hear anything about your GYN appointment within a week or so please call us to check in abnout it.   Try the hydrocortisone Add fiber or metamucil Please call if you have any questions
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
## 2014-11-29 NOTE — Assessment & Plan Note (Signed)
Has stopped prozac, doesn't feel she needs it anymore.

## 2014-11-29 NOTE — Assessment & Plan Note (Signed)
Chronically low Hgb from DUB, check ferritin and start iron PO

## 2014-11-30 ENCOUNTER — Encounter: Payer: Self-pay | Admitting: Family Medicine

## 2014-11-30 LAB — CBC WITH DIFFERENTIAL/PLATELET
Basophils Absolute: 0.1 10*3/uL (ref 0.0–0.1)
Basophils Relative: 1 % (ref 0–1)
EOS PCT: 3 % (ref 0–5)
Eosinophils Absolute: 0.2 10*3/uL (ref 0.0–0.7)
HCT: 32.3 % — ABNORMAL LOW (ref 36.0–46.0)
Hemoglobin: 10.7 g/dL — ABNORMAL LOW (ref 12.0–15.0)
LYMPHS PCT: 51 % — AB (ref 12–46)
Lymphs Abs: 3 10*3/uL (ref 0.7–4.0)
MCH: 28.3 pg (ref 26.0–34.0)
MCHC: 33.1 g/dL (ref 30.0–36.0)
MCV: 85.4 fL (ref 78.0–100.0)
MONO ABS: 0.2 10*3/uL (ref 0.1–1.0)
MPV: 9.2 fL (ref 8.6–12.4)
Monocytes Relative: 4 % (ref 3–12)
Neutro Abs: 2.4 10*3/uL (ref 1.7–7.7)
Neutrophils Relative %: 41 % — ABNORMAL LOW (ref 43–77)
Platelets: 372 10*3/uL (ref 150–400)
RBC: 3.78 MIL/uL — ABNORMAL LOW (ref 3.87–5.11)
RDW: 13.5 % (ref 11.5–15.5)
WBC: 5.9 10*3/uL (ref 4.0–10.5)

## 2014-11-30 LAB — FERRITIN: Ferritin: 18 ng/mL (ref 10–291)

## 2014-12-28 ENCOUNTER — Ambulatory Visit: Payer: Medicaid Other | Admitting: Certified Nurse Midwife

## 2018-08-31 ENCOUNTER — Other Ambulatory Visit: Payer: Self-pay

## 2018-08-31 ENCOUNTER — Emergency Department (HOSPITAL_COMMUNITY): Payer: Medicaid Other

## 2018-08-31 ENCOUNTER — Encounter (HOSPITAL_COMMUNITY): Payer: Self-pay | Admitting: Emergency Medicine

## 2018-08-31 ENCOUNTER — Inpatient Hospital Stay (HOSPITAL_COMMUNITY)
Admission: EM | Admit: 2018-08-31 | Discharge: 2018-09-02 | DRG: 394 | Disposition: A | Discharge status: Home / Self Care | Payer: Medicaid Other | Attending: Internal Medicine | Admitting: Internal Medicine

## 2018-08-31 DIAGNOSIS — E876 Hypokalemia: Secondary | ICD-10-CM | POA: Diagnosis present

## 2018-08-31 DIAGNOSIS — K922 Gastrointestinal hemorrhage, unspecified: Secondary | ICD-10-CM

## 2018-08-31 DIAGNOSIS — Z720 Tobacco use: Secondary | ICD-10-CM

## 2018-08-31 DIAGNOSIS — D509 Iron deficiency anemia, unspecified: Secondary | ICD-10-CM

## 2018-08-31 DIAGNOSIS — K921 Melena: Secondary | ICD-10-CM | POA: Diagnosis present

## 2018-08-31 DIAGNOSIS — D62 Acute posthemorrhagic anemia: Secondary | ICD-10-CM | POA: Diagnosis present

## 2018-08-31 DIAGNOSIS — Z79899 Other long term (current) drug therapy: Secondary | ICD-10-CM

## 2018-08-31 DIAGNOSIS — K625 Hemorrhage of anus and rectum: Secondary | ICD-10-CM | POA: Diagnosis not present

## 2018-08-31 DIAGNOSIS — Z8719 Personal history of other diseases of the digestive system: Secondary | ICD-10-CM

## 2018-08-31 DIAGNOSIS — R590 Localized enlarged lymph nodes: Secondary | ICD-10-CM | POA: Diagnosis not present

## 2018-08-31 DIAGNOSIS — F17290 Nicotine dependence, other tobacco product, uncomplicated: Secondary | ICD-10-CM | POA: Diagnosis present

## 2018-08-31 DIAGNOSIS — K644 Residual hemorrhoidal skin tags: Secondary | ICD-10-CM | POA: Diagnosis present

## 2018-08-31 DIAGNOSIS — Z9889 Other specified postprocedural states: Secondary | ICD-10-CM

## 2018-08-31 DIAGNOSIS — D649 Anemia, unspecified: Secondary | ICD-10-CM | POA: Diagnosis not present

## 2018-08-31 DIAGNOSIS — K648 Other hemorrhoids: Secondary | ICD-10-CM | POA: Diagnosis present

## 2018-08-31 HISTORY — DX: Personal history of other diseases of the digestive system: Z87.19

## 2018-08-31 LAB — CBC WITH DIFFERENTIAL/PLATELET
Abs Immature Granulocytes: 0.02 10*3/uL (ref 0.00–0.07)
Basophils Absolute: 0 10*3/uL (ref 0.0–0.1)
Basophils Relative: 0 %
Eosinophils Absolute: 0.3 10*3/uL (ref 0.0–0.5)
Eosinophils Relative: 3 %
HCT: 25 % — ABNORMAL LOW (ref 36.0–46.0)
Hemoglobin: 7.8 g/dL — ABNORMAL LOW (ref 12.0–15.0)
Immature Granulocytes: 0 %
Lymphocytes Relative: 33 %
Lymphs Abs: 2.4 10*3/uL (ref 0.7–4.0)
MCH: 25.9 pg — ABNORMAL LOW (ref 26.0–34.0)
MCHC: 31.2 g/dL (ref 30.0–36.0)
MCV: 83.1 fL (ref 80.0–100.0)
Monocytes Absolute: 0.6 10*3/uL (ref 0.1–1.0)
Monocytes Relative: 9 %
Neutro Abs: 3.9 10*3/uL (ref 1.7–7.7)
Neutrophils Relative %: 55 %
Platelets: 405 10*3/uL — ABNORMAL HIGH (ref 150–400)
RBC: 3.01 MIL/uL — ABNORMAL LOW (ref 3.87–5.11)
RDW: 17 % — ABNORMAL HIGH (ref 11.5–15.5)
WBC: 7.3 10*3/uL (ref 4.0–10.5)
nRBC: 0 % (ref 0.0–0.2)

## 2018-08-31 LAB — COMPREHENSIVE METABOLIC PANEL
ALT: 16 U/L (ref 0–44)
AST: 21 U/L (ref 15–41)
Albumin: 3.8 g/dL (ref 3.5–5.0)
Alkaline Phosphatase: 42 U/L (ref 38–126)
Anion gap: 10 (ref 5–15)
BUN: 10 mg/dL (ref 6–20)
CO2: 21 mmol/L — ABNORMAL LOW (ref 22–32)
Calcium: 9 mg/dL (ref 8.9–10.3)
Chloride: 107 mmol/L (ref 98–111)
Creatinine, Ser: 0.85 mg/dL (ref 0.44–1.00)
GFR calc Af Amer: >60 mL/min (ref >60)
GFR calc non Af Amer: >60 mL/min (ref >60)
Glucose, Bld: 105 mg/dL — ABNORMAL HIGH (ref 70–99)
Potassium: 3.3 mmol/L — ABNORMAL LOW (ref 3.5–5.1)
Sodium: 138 mmol/L (ref 135–145)
Total Bilirubin: 0.6 mg/dL (ref 0.3–1.2)
Total Protein: 6.9 g/dL (ref 6.5–8.1)

## 2018-08-31 LAB — HEMOGLOBIN AND HEMATOCRIT, BLOOD
HCT: 21.7 % — ABNORMAL LOW (ref 36.0–46.0)
Hemoglobin: 6.7 g/dL — CL (ref 12.0–15.0)

## 2018-08-31 LAB — ABO/RH: ABO/RH(D): O POS

## 2018-08-31 LAB — POC OCCULT BLOOD, ED: FECAL OCCULT BLD: POSITIVE — AB

## 2018-08-31 LAB — IRON AND TIBC
Iron: 14 ug/dL — ABNORMAL LOW (ref 28–170)
SATURATION RATIOS: 3 % — AB (ref 10.4–31.8)
TIBC: 505 ug/dL — AB (ref 250–450)
UIBC: 491 ug/dL

## 2018-08-31 LAB — FERRITIN: Ferritin: 3 ng/mL — ABNORMAL LOW (ref 11–307)

## 2018-08-31 LAB — PROTIME-INR
INR: 0.9 (ref 0.8–1.2)
Prothrombin Time: 12.4 seconds (ref 11.4–15.2)

## 2018-08-31 LAB — PREPARE RBC (CROSSMATCH)

## 2018-08-31 LAB — POC URINE PREG, ED: Preg Test, Ur: NEGATIVE

## 2018-08-31 MED ORDER — MORPHINE SULFATE (PF) 4 MG/ML IV SOLN
4.0000 mg | Freq: Once | INTRAVENOUS | Status: AC
  Administered 2018-08-31: 4 mg via INTRAVENOUS
  Filled 2018-08-31: qty 1

## 2018-08-31 MED ORDER — SODIUM CHLORIDE 0.9% IV SOLUTION: Freq: Once | INTRAVENOUS | Status: DC

## 2018-08-31 MED ORDER — ONDANSETRON HCL 4 MG/2ML IJ SOLN
4.0000 mg | Freq: Once | INTRAMUSCULAR | Status: AC
  Administered 2018-08-31: 4 mg via INTRAVENOUS
  Filled 2018-08-31: qty 2

## 2018-08-31 MED ORDER — MORPHINE SULFATE (PF) 4 MG/ML IV SOLN
4.0000 mg | Freq: Four times a day (QID) | INTRAVENOUS | Status: DC | PRN
  Administered 2018-08-31 – 2018-09-02 (×4): 4 mg via INTRAVENOUS
  Filled 2018-08-31 (×4): qty 1

## 2018-08-31 MED ORDER — IOPAMIDOL (ISOVUE-300) INJECTION 61%
100.0000 mL | Freq: Once | INTRAVENOUS | Status: AC | PRN
  Administered 2018-08-31: 100 mL via INTRAVENOUS

## 2018-08-31 MED ORDER — PEG 3350-KCL-NA BICARB-NACL 420 G PO SOLR
4000.0000 mL | Freq: Once | ORAL | Status: AC
  Administered 2018-08-31: 4000 mL via ORAL
  Filled 2018-08-31: qty 4000

## 2018-08-31 MED ORDER — SODIUM CHLORIDE 0.9% FLUSH
3.0000 mL | Freq: Two times a day (BID) | INTRAVENOUS | Status: DC
  Administered 2018-08-31: 3 mL via INTRAVENOUS

## 2018-08-31 MED ORDER — SODIUM CHLORIDE 0.9 % IV SOLN
INTRAVENOUS | Status: DC
  Administered 2018-08-31: 15:00:00 via INTRAVENOUS

## 2018-08-31 MED ORDER — SODIUM CHLORIDE 0.9 % IV BOLUS
1000.0000 mL | Freq: Once | INTRAVENOUS | Status: AC
  Administered 2018-08-31: 1000 mL via INTRAVENOUS

## 2018-08-31 MED ORDER — ONDANSETRON HCL 4 MG PO TABS: 4.0000 mg | ORAL_TABLET | Freq: Four times a day (QID) | ORAL | Status: DC | PRN

## 2018-08-31 MED ORDER — ONDANSETRON HCL 4 MG/2ML IJ SOLN
4.0000 mg | Freq: Four times a day (QID) | INTRAMUSCULAR | Status: DC | PRN
  Administered 2018-08-31 – 2018-09-02 (×2): 4 mg via INTRAVENOUS
  Filled 2018-08-31 (×2): qty 2

## 2018-08-31 NOTE — ED Notes (Signed)
Patient transported to CT 

## 2018-08-31 NOTE — Progress Notes (Signed)
CRITICAL VALUE ALERT  Critical Value: Hgb 6.7  Date & Time Notied: 08/31/18 2052  Provider Notified: Dr. Myrtie Hawk  Orders Received/Actions taken: transfusion ordered

## 2018-08-31 NOTE — ED Notes (Signed)
Pt signed consent form. MD at bedside.

## 2018-08-31 NOTE — ED Notes (Signed)
ED TO INPATIENT HANDOFF REPORT  ED Nurse Name and Phone #: Gordan Payment  [phone]  S Name/Age/Gender Leah Baker 38 y.o. female Room/Bed: 009C/009C  Code Status   Code Status: Full Code  Home/SNF/Other Home Patient oriented to: self, place, time and situation Is this baseline? Yes   Triage Complete: Triage complete  Chief Complaint bleeding  Triage Note Pt reports feeling sick for a few days but has noticed blood in her stool that has worsened since yesterday. Endorses N/V. States she can taste blood in her mouth but has not thrown any blood up.   Allergies No Known Allergies  Level of Care/Admitting Diagnosis ED Disposition    ED Disposition Condition [facility] Area: [place] [[id number]]  Level of Care: Telemetry Medical [104]  Diagnosis: GI bleed [188416]  Admitting Physician: Aldine Contes [[id number]]  Attending Physician: Aldine Contes [phone]  Estimated length of stay: past midnight tomorrow  Certification:: I certify this patient will need inpatient services for at least 2 midnights  PT Class (Do Not Modify): Inpatient [101]  PT Acc Code (Do Not Modify): Private [1]       B Medical/Surgery History Past Medical History:  Diagnosis Date  . ACL tear 08/2013   left knee  . Anemia    takes iron supplement   . Medial meniscus tear 08/2013   left knee  . PONV (postoperative nausea and vomiting)    only happened once   Past Surgical History:  Procedure Laterality Date  . CESAREAN SECTION  01/15/2002  . CESAREAN SECTION  03/17/2011   Procedure: CESAREAN SECTION;  Surgeon: Osborne Oman, MD;  Location: [facility];  Service: Gynecology;  Laterality: N/A;  . HYSTEROSCOPY W/D&C  06/21/2009  . IUD REMOVAL  06/21/2009  . MULTIPLE TOOTH EXTRACTIONS       A IV Location/Drains/Wounds Patient Lines/Drains/Airways Status   Active Line/Drains/Airways    Name:   Placement date:   Placement time:   Site:   Days:   Peripheral IV 08/31/18 Left Forearm   08/31/18    0830    Forearm   less than 1   Incision 03/17/11 Abdomen Other (Comment)   03/17/11    1234     2724   Incision 03/17/11 Perineum Other (Comment)   03/17/11    1234     2724   Incision (Closed) 09/08/13 Knee Left   09/08/13    0820     1818          Intake/Output Last 24 hours  Intake/Output Summary (Last 24 hours) at 08/31/2018 1321 Last data filed at 08/31/2018 1006 Gross per 24 hour  Intake 1000 ml  Output -  Net 1000 ml    Labs/Imaging Results for orders placed or performed during the hospital encounter of 08/31/18 (from the past 48 hour(s))  POC occult blood, ED     Status: Abnormal   Collection Time: 08/31/18  8:25 AM  Result Value Ref Range   Fecal Occult Bld POSITIVE (A) NEGATIVE  CBC with Differential     Status: Abnormal   Collection Time: 08/31/18  8:28 AM  Result Value Ref Range   WBC 7.3 4.0 - 10.5 K/uL   RBC 3.01 (L) 3.87 - 5.11 MIL/uL   Hemoglobin 7.8 (L) 12.0 - 15.0 g/dL   HCT 25.0 (L) 36.0 - 46.0 %   MCV 83.1 80.0 - 100.0 fL   MCH 25.9 (L) 26.0 - 34.0 pg   MCHC  31.2 30.0 - 36.0 g/dL   RDW 17.0 (H) 11.5 - 15.5 %   Platelets 405 (H) 150 - 400 K/uL   nRBC 0.0 0.0 - 0.2 %   Neutrophils Relative % 55 %   Neutro Abs 3.9 1.7 - 7.7 K/uL   Lymphocytes Relative 33 %   Lymphs Abs 2.4 0.7 - 4.0 K/uL   Monocytes Relative 9 %   Monocytes Absolute 0.6 0.1 - 1.0 K/uL   Eosinophils Relative 3 %   Eosinophils Absolute 0.3 0.0 - 0.5 K/uL   Basophils Relative 0 %   Basophils Absolute 0.0 0.0 - 0.1 K/uL   Immature Granulocytes 0 %   Abs Immature Granulocytes 0.02 0.00 - 0.07 K/uL    Comment: Performed at [facility] [address], [place], [place] [address]  Comprehensive metabolic panel     Status: Abnormal   Collection Time: 08/31/18  8:28 AM  Result Value Ref Range   Sodium 138 135 - 145 mmol/L   Potassium 3.3 (L) 3.5 - 5.1 mmol/L   Chloride 107 98 - 111 mmol/L   CO2 21 (L) 22 - 32 mmol/L   Glucose, Bld  105 (H) 70 - 99 mg/dL   BUN 10 6 - 20 mg/dL   Creatinine, Ser 0.85 0.44 - 1.00 mg/dL   Calcium 9.0 8.9 - 10.3 mg/dL   Total Protein 6.9 6.5 - 8.1 g/dL   Albumin 3.8 3.5 - 5.0 g/dL   AST 21 15 - 41 U/L   ALT 16 0 - 44 U/L   Alkaline Phosphatase 42 38 - 126 U/L   Total Bilirubin 0.6 0.3 - 1.2 mg/dL   GFR calc non Af Amer >60 >60 mL/min   GFR calc Af Amer >60 >60 mL/min   Anion gap 10 5 - 15    Comment: Performed at [facility], [place] [address] [place], [place] [address]  Protime-INR     Status: None   Collection Time: 08/31/18  8:28 AM  Result Value Ref Range   Prothrombin Time 12.4 11.4 - 15.2 seconds   INR 0.9 0.8 - 1.2    Comment: (NOTE) INR goal varies based on device and disease states. Performed at [facility], [place] [address] [place], [place] [address]   POC Urine Pregnancy, ED (not at [facility])     Status: None   Collection Time: 08/31/18  9:51 AM  Result Value Ref Range   Preg Test, Ur NEGATIVE NEGATIVE    Comment:        THE SENSITIVITY OF THIS METHODOLOGY IS >24 mIU/mL   Type and screen [place]     Status: None   Collection Time: 08/31/18 10:04 AM  Result Value Ref Range   ABO/RH(D) O POS    Antibody Screen NEG    Sample Expiration      09/03/2018 Performed at [facility], [place] [address] [place], [place] [address]   ABO/Rh     Status: None (Preliminary result)   Collection Time: 08/31/18 10:04 AM  Result Value Ref Range   ABO/RH(D)      O POS Performed at [facility] [address], [place],  [address]    Ct Abdomen Pelvis W Contrast  Result Date: 08/31/2018 CLINICAL DATA:  Right lower abdominal pain for 2 days. EXAM: CT ABDOMEN AND PELVIS WITH CONTRAST TECHNIQUE: Multidetector CT imaging of the abdomen and pelvis was performed using the standard protocol following bolus  administration of intravenous contrast. CONTRAST:  11mL ISOVUE-300 IOPAMIDOL (ISOVUE-300) INJECTION 61% COMPARISON:  07/30/2009  FINDINGS: Lower chest: Dependent subsegmental atelectasis in the lung bases. No pleural effusion. Hepatobiliary: Partly rounded though somewhat poorly defined area of hyperenhancement peripherally in hepatic segment II measuring approximately 1.1 cm without an abnormality evident in this location on the prior unenhanced CT. Unremarkable gallbladder. No biliary dilatation. Pancreas: Unremarkable. Spleen: Unremarkable. Adrenals/Urinary Tract: Unremarkable adrenal glands. No evidence of renal mass, calculi, or hydronephrosis. Unremarkable bladder. Stomach/Bowel: The stomach is unremarkable. No bowel dilatation or gross bowel wall thickening is identified. Scattered sigmoid colon diverticula are noted without evidence of diverticulitis. The appendix is unremarkable. Vascular/Lymphatic: No significant vascular findings are present. A borderline enlarged right common iliac lymph node measures 10 mm in short axis. Mildly enlarged right external iliac nodes measure 11-12 mm in short axis while right inguinal nodes measure up to 17 mm. Reproductive: Unremarkable uterus. Tubal ligation. Slightly prominent size of both ovaries without a focal abnormality identified by CT. Other: No ascites. Small fat containing umbilical hernia. Musculoskeletal: No suspicious osseous lesion. IMPRESSION: 1. New right iliac and right inguinal lymphadenopathy of uncertain etiology. While a reactive/inflammatory etiology is possible, metastatic disease is also a concern. 2. Normal appendix. 3. 1.1 cm hypervascular lesion versus perfusion anomaly in the left hepatic lobe. Nonemergent abdominal MRI (without and with contrast) is recommended for further evaluation. Electronically Signed   By: Logan Bores [name]   On: 08/31/2018 10:43    Pending Labs Unresulted Labs (From admission, onward)    Start     Ordered   09/01/18 0500  CBC  Tomorrow morning,   R     08/31/18 1235   08/31/18 1258  HIV antibody (Routine Testing)  Once,   R     08/31/18  1306          Vitals/Pain Today's Vitals   08/31/18 1103 08/31/18 1200 08/31/18 1230 08/31/18 1300  BP: 123/71 (!) 126/91 119/84 122/89  Pulse: 81 84 77 79  Resp: 20 20  13   Temp:      TempSrc:      SpO2: 99% 100% 100% 100%  Weight:      Height:      PainSc:        Isolation Precautions No active isolations  Medications Medications  polyethylene glycol-electrolytes (NuLYTELY/GoLYTELY) solution 4,000 mL (has no administration in time range)  sodium chloride flush (NS) 0.9 % injection 3 mL (has no administration in time range)  ondansetron (ZOFRAN) tablet 4 mg (has no administration in time range)    Or  ondansetron (ZOFRAN) injection 4 mg (has no administration in time range)  ondansetron (ZOFRAN) injection 4 mg (4 mg Intravenous Given 08/31/18 0833)  sodium chloride 0.9 % bolus 1,000 mL (0 mLs Intravenous Stopped 08/31/18 1006)  morphine 4 MG/ML injection 4 mg (4 mg Intravenous Given 08/31/18 1006)  iopamidol (ISOVUE-300) 61 % injection 100 mL (100 mLs Intravenous Contrast Given 08/31/18 1025)    Mobility walks Low fall risk   Focused Assessments GI    R Recommendations: See Admitting Provider Note  Report given to:   Additional Notes:

## 2018-08-31 NOTE — ED Triage Notes (Signed)
Pt reports feeling sick for a few days but has noticed blood in her stool that has worsened since yesterday. Endorses N/V. States she can taste blood in her mouth but has not thrown any blood up.

## 2018-08-31 NOTE — H&P (Signed)
Date: 08/31/2018               Patient Name:  Leah Baker MRN: [id number]  [name]: 04/20/1981 Age / Sex: 38 y.o., female   PCP: No primary care provider on file.         Medical Service: Internal Medicine Teaching Service         Attending Physician: Dr. Aldine Contes, MD    First Contact: Dr. Koleen Distance  Pager: [phone]  Second Contact: Dr. Trilby Drummer  Pager: [phone]       After Hours (After 5p/  First Contact Pager: [phone]  weekends / holidays): Second Contact Pager: [phone]   Chief Complaint: BRBPR  History of Present Illness: Leah Baker is a 38 y/o otherwise healthy female who presents with 1 week of intermittent BRBPR. She first noticed it when she went to wipe after a BM. It became progressively heavier throughout the week. This morning she passed a large clot and subsequently became nauseated with associated lightheadedness. No syncope. She has also noticed worsening fatigue and DOE since symptom onset. Endorses decreased appetite the last 2 days. Her bowel movements have been painless. She also developed acute onset sharp RLQ pain this morning that is non-radiating. Denies fevers, chills, weight loss, headaches, vomiting, chest pain. No history of similar symptoms.  She has a history of hemorrhoids during her pregnancy 16 years ago, but has not had issues with rectal bleeding since then. She takes NSAIDs 3-4x week for the last several years after an ACL repair of her left knee.   Meds:  Current Meds  Medication Sig  . hydrocortisone (ANUSOL-HC) 2.5 % rectal cream Place 1 application rectally 2 (two) times daily.  . Multiple Vitamins-Minerals (WOMENS MULTI VITAMIN & MINERAL PO) Take 1 tablet by mouth daily.  . shark liver oil-cocoa butter (PREPARATION H) 0.25-88.44 % suppository Place 1 suppository rectally as needed for hemorrhoids.     Allergies: Allergies as of 08/31/2018  . (No Known Allergies)   Past Medical History:  Diagnosis Date  . ACL tear 08/2013   left  knee  . Anemia    takes iron supplement   . Medial meniscus tear 08/2013   left knee  . PONV (postoperative nausea and vomiting)    only happened once    Family History: significant for cancer throughout mother's side including throat and liver. Mother had ESRD 2/2 drug abuse.   Social History: Leah Baker works in medical coding. She smokes a cigar and drinks EtOH for special occasions. No illicit substances.   Review of Systems: A complete ROS was negative except as per HPI.   Physical Exam: Blood pressure 122/89, pulse 79, temperature 98.1 F (36.7 C), temperature source Oral, resp. rate 13, height 5\' 2"  (1.575 m), weight 73.5 kg, last menstrual period 08/01/2018, SpO2 100 %. General: awake, alert, pleasant female sitting up in bed in NAD HEENT: [place]/AT. Conjunctival pallor.  Neck: Supple. no thyromegaly or LAD CV: RRR; no murmurs rubs or gallops Pulm: normal respiratory effort on room air; lungs CTAB Abd: BS+; abdomen is soft, non-distended. Mild RLQ tenderness without rebound or guarding.  Ext: no edema; good distal pulses Neuro: A&Ox3; no focal deficits Skin: warm and dry. No rashes or skin breakdown Psych: appropriate mood and affect   Assessment & Plan by Problem: Active Problems:   GI bleed  Leah Baker is a 38 y/o otherwise healthy female who presents with 1 week history of BRBPR with progressive fatigue and DOE. On  arrival, she was mildly tachycardic but otherwise hemodynamically stable. Work-up significant for Hgb of 7.8, normal white count. CMP unremarkable other than mild hypokalemia at 3.3. Normal Coags. CT of the abdomen and pelvis showed right iliac and inguinal LAD, but otherwise unremarkable.   1. Acute blood loss anemia 2/2 GI bleed - symptoms consistent with lower GI bleed - symptomatic anemia symptom have improved after a liter of fluids - appreciate GI evaluation; they will proceed with colonoscopy tomorrow - she is on clear liquid diet; NPO at midnight -  given her hgb of 7.8 and symptoms resolved after IVF, no indication for transfusion at this time - Repeat H&H at 8 pm to ensure blood counts have remained stable; transfusion goal of >7  2. Mild hypokalemia - will replete orally with 40 mEq   Diet: Clear liquid; NPO at midnight DVT ppx: SCDs CODE: FULL  Dispo: Admit patient to Inpatient with expected length of stay greater than 2 midnights.  SignedDelice Bison, DO 08/31/2018, 1:29 PM  Pager: [phone]

## 2018-08-31 NOTE — Consult Note (Signed)
Referring Provider:  EDP Primary Care Physician:  Nuala Alpha, DO Primary Gastroenterologist: Althia Forts  Reason for Consultation: Rectal bleeding  HPI: Leah Baker is a 38 y.o. female presented to the hospital with abdominal pain and rectal bleeding.  Upon initial evaluation in the emergency room, she was found to have anemia with hemoglobin of 7.8.  Normal CMP except for potassium of 3.3.  Normal INR.  Her hemoglobin in June 2019 was around 10.7.  She was also found to have some shortness of breath and tachycardia.  GI is consulted for further evaluation.  Patient seen and examined at bedside in the emergency room.  She has been having intermittent blood on the tissue paper since her pregnancy which was attributed to hemorrhoids.  She was at her unusual state of health until 3 weeks ago when she had an episode of diarrhea which was self resolving.  Around 1 week ago she started noticing bright red blood per rectum.  Symptoms gradually getting worse.  Also complaining of right lower quadrant abdominal pain which started this morning.  Complaining of nausea but denied any vomiting.  Denied any weight loss.  No Family history of colon cancer.  No previous EGD or colonoscopy  CT abdomen pelvis with contrast showed new right iliac and right inguinal lymphadenopathy as well as 1.1 cm hypervascular lesion versus perfusion abnormality in the left hepatic lobe.  Nonemergent abdominal MRI recommended for further evaluation.  Past Medical History:  Diagnosis Date  . ACL tear 08/2013   left knee  . Anemia    takes iron supplement   . Medial meniscus tear 08/2013   left knee  . PONV (postoperative nausea and vomiting)    only happened once    Past Surgical History:  Procedure Laterality Date  . CESAREAN SECTION  01/15/2002  . CESAREAN SECTION  03/17/2011   Procedure: CESAREAN SECTION;  Surgeon: Osborne Oman, MD;  Location: [facility];  Service: Gynecology;  Laterality: N/A;  . HYSTEROSCOPY  W/D&C  06/21/2009  . IUD REMOVAL  06/21/2009  . MULTIPLE TOOTH EXTRACTIONS      Prior to Admission medications   Medication Sig Start Date End Date Taking? Authorizing Provider  ferrous sulfate 325 (65 FE) MG EC tablet Take 1 tablet (325 mg total) by mouth 3 (three) times daily with meals. 11/29/14   Timmothy Euler, MD  FLUoxetine (PROZAC) 20 MG capsule Take 1 capsule (20 mg total) by mouth daily. 05/14/11 05/13/12  Deneise Lever, MD  hydrocortisone (ANUSOL-HC) 2.5 % rectal cream Place 1 application rectally 2 (two) times daily. 11/29/14   Timmothy Euler, MD    Scheduled Meds: Continuous Infusions: PRN Meds:.  Allergies as of 08/31/2018  . (No Known Allergies)    No family history on file.  Social History   Socioeconomic History  . Marital status: Single    Spouse name: Not on file  . Number of children: Not on file  . Years of education: Not on file  . Highest education level: Not on file  Occupational History  . Not on file  Social Needs  . Financial resource strain: Not on file  . Food insecurity:    Worry: Not on file    Inability: Not on file  . Transportation needs:    Medical: Not on file    Non-medical: Not on file  Tobacco Use  . Smoking status: Former Smoker    Last attempt to quit: 08/22/2008    Years since quitting: 10.0  .  Smokeless tobacco: Never Used  Substance and Sexual Activity  . Alcohol use: Yes    Comment: occasionally  . Drug use: No  . Sexual activity: Yes  Lifestyle  . Physical activity:    Days per week: Not on file    Minutes per session: Not on file  . Stress: Not on file  Relationships  . Social connections:    Talks on phone: Not on file    Gets together: Not on file    Attends religious service: Not on file    Active member of club or organization: Not on file    Attends meetings of clubs or organizations: Not on file    Relationship status: Not on file  . Intimate partner violence:    Fear of current or ex partner:  Not on file    Emotionally abused: Not on file    Physically abused: Not on file    Forced sexual activity: Not on file  Other Topics Concern  . Not on file  Social History Narrative  . Not on file    Review of Systems: Review of Systems  Constitutional: Negative for chills and fever.  HENT: Negative for hearing loss and tinnitus.   Eyes: Negative for blurred vision and double vision.  Respiratory: Negative for cough, hemoptysis and sputum production.   Cardiovascular: Negative for chest pain and palpitations.  Gastrointestinal: Positive for abdominal pain, blood in stool, diarrhea, heartburn and nausea. Negative for constipation and vomiting.  Genitourinary: Negative for dysuria and urgency.  Musculoskeletal: Positive for myalgias.  Skin: Negative for itching and rash.  Neurological: Negative for seizures and loss of consciousness.  Endo/Heme/Allergies: Does not bruise/bleed easily.  Psychiatric/Behavioral: Negative for hallucinations and memory loss. The patient is nervous/anxious.     Physical Exam: Vital signs: Vitals:   08/31/18 1100 08/31/18 1103  BP: 123/71 123/71  Pulse:  81  Resp: 15 20  Temp:    SpO2:  99%     Physical Exam  Constitutional: She is oriented to person, place, and time. She appears well-developed and well-nourished. No distress.  HENT:  Head: Normocephalic and atraumatic.  Mouth/Throat: Oropharynx is clear and moist. No oropharyngeal exudate.  Eyes: Pupils are equal, round, and reactive to light. EOM are normal. No scleral icterus.  Neck: Normal range of motion. Neck supple. No thyromegaly present.  Cardiovascular: Normal rate, regular rhythm and normal heart sounds.  Pulmonary/Chest: Effort normal and breath sounds normal. No respiratory distress.  Abdominal: Soft. Bowel sounds are normal. She exhibits no distension. There is abdominal tenderness. There is no rebound and no guarding.  Mild right lower quadrant tenderness to palpation   Musculoskeletal: Normal range of motion.        General: No edema.  Neurological: She is alert and oriented to person, place, and time.  Skin: Skin is warm. No erythema.  Psychiatric: She has a normal mood and affect. Judgment normal.  Vitals reviewed.  GI:  Lab Results: Recent Labs    08/31/18 0828  WBC 7.3  HGB 7.8*  HCT 25.0*  PLT 405*   BMET Recent Labs    08/31/18 0828  NA 138  K 3.3*  CL 107  CO2 21*  GLUCOSE 105*  BUN 10  CREATININE 0.85  CALCIUM 9.0   LFT Recent Labs    08/31/18 0828  PROT 6.9  ALBUMIN 3.8  AST 21  ALT 16  ALKPHOS 42  BILITOT 0.6   PT/INR Recent Labs    08/31/18 0828  LABPROT 12.4  INR 0.9     Studies/Results: Ct Abdomen Pelvis W Contrast  Result Date: 08/31/2018 CLINICAL DATA:  Right lower abdominal pain for 2 days. EXAM: CT ABDOMEN AND PELVIS WITH CONTRAST TECHNIQUE: Multidetector CT imaging of the abdomen and pelvis was performed using the standard protocol following bolus administration of intravenous contrast. CONTRAST:  120mL ISOVUE-300 IOPAMIDOL (ISOVUE-300) INJECTION 61% COMPARISON:  07/30/2009 FINDINGS: Lower chest: Dependent subsegmental atelectasis in the lung bases. No pleural effusion. Hepatobiliary: Partly rounded though somewhat poorly defined area of hyperenhancement peripherally in hepatic segment II measuring approximately 1.1 cm without an abnormality evident in this location on the prior unenhanced CT. Unremarkable gallbladder. No biliary dilatation. Pancreas: Unremarkable. Spleen: Unremarkable. Adrenals/Urinary Tract: Unremarkable adrenal glands. No evidence of renal mass, calculi, or hydronephrosis. Unremarkable bladder. Stomach/Bowel: The stomach is unremarkable. No bowel dilatation or gross bowel wall thickening is identified. Scattered sigmoid colon diverticula are noted without evidence of diverticulitis. The appendix is unremarkable. Vascular/Lymphatic: No significant vascular findings are present. A borderline  enlarged right common iliac lymph node measures 10 mm in short axis. Mildly enlarged right external iliac nodes measure 11-12 mm in short axis while right inguinal nodes measure up to 17 mm. Reproductive: Unremarkable uterus. Tubal ligation. Slightly prominent size of both ovaries without a focal abnormality identified by CT. Other: No ascites. Small fat containing umbilical hernia. Musculoskeletal: No suspicious osseous lesion. IMPRESSION: 1. New right iliac and right inguinal lymphadenopathy of uncertain etiology. While a reactive/inflammatory etiology is possible, metastatic disease is also a concern. 2. Normal appendix. 3. 1.1 cm hypervascular lesion versus perfusion anomaly in the left hepatic lobe. Nonemergent abdominal MRI (without and with contrast) is recommended for further evaluation. Electronically Signed   By: Logan Bores [name]   On: 08/31/2018 10:43    Impression/Plan: -Rectal bleeding.  Intermittent rectal bleeding for many years with recent worsening of symptoms.  CT scan showing right iliac and right inguinal lymphadenopathy which could be reactive but there is also possibility of metastatic disease. -Acute blood loss anemia -1.1 cm hypervascular lesion in the left lobe of liver.  Recommend nonemergent MRI as an outpatient basis.  Recommendations ------------------------- -Start clear liquid diet today. -N P.o. past midnight -Colonoscopy tomorrow. -Monitor H&H.  Transfuse to keep hemoglobin around 7 to  8.  Risks (bleeding, infection, bowel perforation that could require surgery, sedation-related changes in cardiopulmonary systems), benefits (identification and possible treatment of source of symptoms, exclusion of certain causes of symptoms), and alternatives (watchful waiting, radiographic imaging studies, empiric medical treatment)  were explained to patient/family in detail and patient wishes to proceed.   LOS: 0 days   Otis Brace  MD, FACP 08/31/2018, 12:10  PM  Contact #  [phone]

## 2018-08-31 NOTE — ED Provider Notes (Signed)
[facility] EMERGENCY DEPARTMENT Provider Note   CSN: [id number] Arrival date & time: 08/31/18  0809    History   Chief Complaint Chief Complaint  Patient presents with  . Blood In Stools    HPI Leah Baker is a 38 y.o. female.     HPI   38 year old female presents today with complaints of abdominal pain and rectal bleeding.  Patient notes approximately 1 week ago she started feeling ill and nauseous with decreased appetite.  She notes a small amount of blood on her tissue when wiping from the rectum.  She thought this was likely secondary to hemorrhoids which she has had in the past.  She notes the bleeding has increased, this morning she developed acute right lower quadrant abdominal pain and had large clots and red blood with her bowel movement, she notes a stream of blood per rectum with bowel movement this morning..  She notes her stool has had normal consistency and is normal brown.  She denies any upper abdominal pain, she denies any vomiting but notes nausea.  She denies any abnormal food or drink.  She has had cesarean sections no other abdominal surgeries.  She denies any fever at home.  She does not drink alcohol or use any anticoagulants.  She denies any other abnormal bruising or bleeding.  She notes she has had shortness of breath and fatigue, notes that walking short distances because shortness of breath.  Past Medical History:  Diagnosis Date  . ACL tear 08/2013   left knee  . Anemia    takes iron supplement   . Medial meniscus tear 08/2013   left knee  . PONV (postoperative nausea and vomiting)    only happened once    Patient Active Problem List   Diagnosis Date Noted  . Lower GI bleed 08/31/2018  . Hemorrhoid 11/29/2014  . Symptomatic anemia 05/04/2014  . Mood disorder ([place]) 02/20/2014  . Healthcare maintenance 01/02/2014  . Tears of meniscus and ACL of left knee 09/08/2013  . Right shoulder pain 05/19/2013  . Left knee pain  05/19/2013  . Abnormal uterine bleeding 03/11/2013  . Dysmenorrhea 10/19/2011  . Depression 05/14/2011  . H/O: cesarean section 08/28/2010  . Alcohol abuse 08/25/2010  . Marijuana abuse 08/25/2010  . TOBACCO USER 03/10/2009    Past Surgical History:  Procedure Laterality Date  . CESAREAN SECTION  01/15/2002  . CESAREAN SECTION  03/17/2011   Procedure: CESAREAN SECTION;  Surgeon: Osborne Oman, MD;  Location: [facility];  Service: Gynecology;  Laterality: N/A;  . HYSTEROSCOPY W/D&C  06/21/2009  . IUD REMOVAL  06/21/2009  . MULTIPLE TOOTH EXTRACTIONS       OB History    Gravida  4   Para  2   Term  2   Preterm  0   AB  2   Living  2     SAB  0   TAB  2   Ectopic  0   Multiple  0   Live Births  2            Home Medications    Prior to Admission medications   Medication Sig Start Date End Date Taking? Authorizing Provider  hydrocortisone (ANUSOL-HC) 2.5 % rectal cream Place 1 application rectally 2 (two) times daily. 11/29/14  Yes Timmothy Euler, MD  Multiple Vitamins-Minerals (WOMENS MULTI VITAMIN & MINERAL PO) Take 1 tablet by mouth daily.   Yes [provider]  shark liver oil-cocoa  butter (PREPARATION H) 0.25-88.44 % suppository Place 1 suppository rectally as needed for hemorrhoids.   Yes [provider]  ferrous sulfate 325 (65 FE) MG EC tablet Take 1 tablet (325 mg total) by mouth 3 (three) times daily with meals. Patient not taking: Reported on 08/31/2018 11/29/14   Timmothy Euler, MD  FLUoxetine (PROZAC) 20 MG capsule Take 1 capsule (20 mg total) by mouth daily. Patient not taking: Reported on 08/31/2018 05/14/11 05/13/12  Deneise Lever, MD    Family History No family history on file.  Social History Social History   Tobacco Use  . Smoking status: Former Smoker    Last attempt to quit: 08/22/2008    Years since quitting: 10.0  . Smokeless tobacco: Never Used  Substance Use Topics  . Alcohol use: Yes    Comment:  occasionally  . Drug use: No     Allergies   Patient has no known allergies.   Review of Systems Review of Systems  All other systems reviewed and are negative.   Physical Exam Updated Vital Signs BP 113/78 (BP Location: Right Arm)   Pulse 72   Temp 98.2 F (36.8 C) (Oral)   Resp 18   Ht 5\' 2"  (1.575 m)   Wt 73.5 kg   LMP 08/01/2018   SpO2 99%   BMI 29.63 kg/m   Physical Exam Vitals signs and nursing note reviewed.  Constitutional:      Appearance: She is well-developed.  HENT:     Head: Normocephalic and atraumatic.  Eyes:     General: No scleral icterus.       Right eye: No discharge.        Left eye: No discharge.     Conjunctiva/sclera: Conjunctivae normal.     Pupils: Pupils are equal, round, and reactive to light.  Neck:     Musculoskeletal: Normal range of motion.     Vascular: No JVD.     Trachea: No tracheal deviation.  Pulmonary:     Effort: Pulmonary effort is normal.     Breath sounds: No stridor.  Abdominal:     Comments: Tenderness palpation right lower quadrant remainder abdomen soft nontender  Genitourinary:    Comments: No external hemorrhoids noted, internal exam with no acute masses, no blood noted Neurological:     Mental Status: She is alert and oriented to person, place, and time.     Coordination: Coordination normal.  Psychiatric:        Behavior: Behavior normal.        Thought Content: Thought content normal.        Judgment: Judgment normal.      ED Treatments / Results  Labs (all labs ordered are listed, but only abnormal results are displayed) Labs Reviewed  CBC WITH DIFFERENTIAL/PLATELET - Abnormal; Notable for the following components:      Result Value   RBC 3.01 (*)    Hemoglobin 7.8 (*)    HCT 25.0 (*)    MCH 25.9 (*)    RDW 17.0 (*)    Platelets 405 (*)    All other components within normal limits  COMPREHENSIVE METABOLIC PANEL - Abnormal; Notable for the following components:   Potassium 3.3 (*)    CO2 21  (*)    Glucose, Bld 105 (*)    All other components within normal limits  HEMOGLOBIN AND HEMATOCRIT, BLOOD - Abnormal; Notable for the following components:   Hemoglobin 6.7 (*)    HCT 21.7 (*)  All other components within normal limits  IRON AND TIBC - Abnormal; Notable for the following components:   Iron 14 (*)    TIBC 505 (*)    Saturation Ratios 3 (*)    All other components within normal limits  FERRITIN - Abnormal; Notable for the following components:   Ferritin 3 (*)    All other components within normal limits  CBC - Abnormal; Notable for the following components:   RBC 3.10 (*)    Hemoglobin 8.0 (*)    HCT 25.0 (*)    MCH 25.8 (*)    RDW 16.7 (*)    All other components within normal limits  BASIC METABOLIC PANEL - Abnormal; Notable for the following components:   Potassium 3.2 (*)    Glucose, Bld 100 (*)    BUN 5 (*)    Calcium 8.4 (*)    All other components within normal limits  POC OCCULT BLOOD, ED - Abnormal; Notable for the following components:   Fecal Occult Bld POSITIVE (*)    All other components within normal limits  PROTIME-INR  HIV ANTIBODY (ROUTINE TESTING W REFLEX)  POC URINE PREG, ED  TYPE AND SCREEN  ABO/RH  PREPARE RBC (CROSSMATCH)    EKG None  Radiology Ct Abdomen Pelvis W Contrast  Result Date: 08/31/2018 CLINICAL DATA:  Right lower abdominal pain for 2 days. EXAM: CT ABDOMEN AND PELVIS WITH CONTRAST TECHNIQUE: Multidetector CT imaging of the abdomen and pelvis was performed using the standard protocol following bolus administration of intravenous contrast. CONTRAST:  194mL ISOVUE-300 IOPAMIDOL (ISOVUE-300) INJECTION 61% COMPARISON:  07/30/2009 FINDINGS: Lower chest: Dependent subsegmental atelectasis in the lung bases. No pleural effusion. Hepatobiliary: Partly rounded though somewhat poorly defined area of hyperenhancement peripherally in hepatic segment II measuring approximately 1.1 cm without an abnormality evident in this location on  the prior unenhanced CT. Unremarkable gallbladder. No biliary dilatation. Pancreas: Unremarkable. Spleen: Unremarkable. Adrenals/Urinary Tract: Unremarkable adrenal glands. No evidence of renal mass, calculi, or hydronephrosis. Unremarkable bladder. Stomach/Bowel: The stomach is unremarkable. No bowel dilatation or gross bowel wall thickening is identified. Scattered sigmoid colon diverticula are noted without evidence of diverticulitis. The appendix is unremarkable. Vascular/Lymphatic: No significant vascular findings are present. A borderline enlarged right common iliac lymph node measures 10 mm in short axis. Mildly enlarged right external iliac nodes measure 11-12 mm in short axis while right inguinal nodes measure up to 17 mm. Reproductive: Unremarkable uterus. Tubal ligation. Slightly prominent size of both ovaries without a focal abnormality identified by CT. Other: No ascites. Small fat containing umbilical hernia. Musculoskeletal: No suspicious osseous lesion. IMPRESSION: 1. New right iliac and right inguinal lymphadenopathy of uncertain etiology. While a reactive/inflammatory etiology is possible, metastatic disease is also a concern. 2. Normal appendix. 3. 1.1 cm hypervascular lesion versus perfusion anomaly in the left hepatic lobe. Nonemergent abdominal MRI (without and with contrast) is recommended for further evaluation. Electronically Signed   By: Logan Bores [name]   On: 08/31/2018 10:43    Procedures Procedures (including critical care time)  Medications Ordered in ED Medications  0.9 %  sodium chloride infusion ( Intravenous New Bag/Given 08/31/18 1524)  sodium chloride flush (NS) 0.9 % injection 3 mL (3 mLs Intravenous Given 08/31/18 1441)  ondansetron (ZOFRAN) tablet 4 mg ( Oral See Alternative 08/31/18 1759)    Or  ondansetron (ZOFRAN) injection 4 mg (4 mg Intravenous Given 08/31/18 1759)  morphine 4 MG/ML injection 4 mg (4 mg Intravenous Given 08/31/18 2120)  0.9 %  sodium chloride  infusion (Manually program via Guardrails IV Fluids) (has no administration in time range)  potassium chloride 10 mEq in 100 mL IVPB (has no administration in time range)  ondansetron (ZOFRAN) injection 4 mg (4 mg Intravenous Given 08/31/18 0833)  sodium chloride 0.9 % bolus 1,000 mL (0 mLs Intravenous Stopped 08/31/18 1006)  morphine 4 MG/ML injection 4 mg (4 mg Intravenous Given 08/31/18 1006)  iopamidol (ISOVUE-300) 61 % injection 100 mL (100 mLs Intravenous Contrast Given 08/31/18 1025)  polyethylene glycol-electrolytes (NuLYTELY/GoLYTELY) solution 4,000 mL (4,000 mLs Oral Given 08/31/18 1438)     Initial Impression / Assessment and Plan / ED Course  I have reviewed the triage vital signs and the nursing notes.  Pertinent labs & imaging results that were available during my care of the patient were reviewed by me and considered in my medical decision making (see chart for details).        Labs: Type and screen, point-of-care urine prior, CBC CMP, PT/INR, point-of-care occult blood  Imaging: CT abdomen and pelvis with contrast  Consults: Gastroenterology  Therapeutics: Normal saline, morphine  Discharge Meds:   Assessment/Plan: 38 year old female presents today with gastrointestinal bleed.  She is symptomatic from this, she has a hemoglobin of 9.6 down from 12.1 on 04/19/2017.  Patient CT scan shows inguinal and iliac lymphadenopathy but no acute findings noted.  Incidental finding of 1.1 cm hypervascular lesion versus perfusion abnormality in the left hepatic lobe, this was discussed with the patient and she is aware that she will need imaging of this non-emergently.  Given her symptomatic anemia from gastrointestinal bleed gastroenterology will be consulted at this time.  Gastroenterology consulted who will proceed with colonoscopy tomorrow morning.  Patient's tachycardia improved with fluids, no immediate indication for transfusion at this time.  Internal medicine consulted who will  accept the patient.    Final Clinical Impressions(s) / ED Diagnoses   Final diagnoses:  Lower GI bleed  Symptomatic anemia    ED Discharge Orders    None       Francee Gentile 09/01/18 0720    Virgel Manifold, MD 09/02/18 1329

## 2018-08-31 NOTE — TOC Initial Note (Signed)
Transition of Care [facility]) - Initial/Assessment Note    Patient Details  Name: Leah Baker MRN: [id number] Date of Birth: Jan 06, 1981  [facility]) CM/SW Contact:    Marilu Favre, RN Phone Number: 08/31/2018, 2:55 PM  Clinical Narrative:                 Patient from home with husband Leah Baker. Patient has not seen a PCP in 2 years and is currently does not have insurance. She has started a new job and will be eligible for insurance "soon".   Patient interested in establishing care at [facility] schedule appointment for September 08, 2018 at 0850 am.   Will continue to follow for discharge needs. Patient can use pharmacy at [facility] also.    Expected Discharge Plan: Home/Self Care Barriers to Discharge: Continued Medical Work up   Patient Goals and CMS Choice Patient states their goals for this hospitalization and ongoing recovery are:: to go home       Expected Discharge Plan and Services Expected Discharge Plan: Home/Self Care In-house Referral: Financial Counselor Discharge Planning Services: CM Consult, Follow-up appt scheduled, [facility], [facility] Choice: NA Living arrangements for the past 2 months: Single Family Home                 DME Arranged: N/A DME Agency: NA HH Arranged: NA HH Agency: NA  Prior Living Arrangements/Services Living arrangements for the past 2 months: Single Family Home Lives with:: Spouse(Patient stated Leah Baker is her husband ) Patient language and need for interpreter reviewed:: No Do you feel safe going back to the place where you live?: Yes            Criminal Activity/Legal Involvement Pertinent to Current Situation/Hospitalization: No - Comment as needed  Activities of Daily Living      Permission Sought/Granted Permission sought to share information with : Case Manager, Family Supports Permission granted to share information with :  Yes, Verbal Permission Granted  Share Information with NAME: Leah Baker      Permission granted to share info w Relationship: husband   Permission granted to share info w Contact Information: yes   Emotional Assessment Appearance:: Appears stated age Attitude/Demeanor/Rapport: Engaged Affect (typically observed): Accepting Orientation: : Oriented to Place, Oriented to  Time, Oriented to Self, Oriented to Situation   Psych Involvement: No (comment)  Admission diagnosis:  Lower GI bleed [K92.2] Symptomatic anemia [D64.9] Patient Active Problem List   Diagnosis Date Noted  . Lower GI bleed 08/31/2018  . Hemorrhoid 11/29/2014  . Symptomatic anemia 05/04/2014  . Mood disorder ([place]) 02/20/2014  . Healthcare maintenance 01/02/2014  . Tears of meniscus and ACL of left knee 09/08/2013  . Right shoulder pain 05/19/2013  . Left knee pain 05/19/2013  . Abnormal uterine bleeding 03/11/2013  . Dysmenorrhea 10/19/2011  . Depression 05/14/2011  . H/O: cesarean section 08/28/2010  . Alcohol abuse 08/25/2010  . Marijuana abuse 08/25/2010  . TOBACCO USER 03/10/2009   PCP:  No primary care provider on file. Pharmacy:   [facility] [address] [place] - [address] [place] [address] Phone: [phone] Fax: [phone]  [place], [place] - [address] [place] [address] Phone: [phone] Fax: [phone]     Social Determinants of Health (SDOH) Interventions    Readmission Risk Interventions No flowsheet data found.

## 2018-09-01 ENCOUNTER — Other Ambulatory Visit: Payer: Self-pay

## 2018-09-01 ENCOUNTER — Encounter (HOSPITAL_COMMUNITY): Payer: Self-pay

## 2018-09-01 ENCOUNTER — Inpatient Hospital Stay (HOSPITAL_COMMUNITY): Payer: Medicaid Other | Admitting: Registered Nurse

## 2018-09-01 ENCOUNTER — Encounter (HOSPITAL_COMMUNITY)
Admission: EM | Disposition: A | Discharge status: Home / Self Care | Payer: Self-pay | Source: Home / Self Care | Attending: Internal Medicine

## 2018-09-01 DIAGNOSIS — K922 Gastrointestinal hemorrhage, unspecified: Secondary | ICD-10-CM

## 2018-09-01 DIAGNOSIS — D509 Iron deficiency anemia, unspecified: Secondary | ICD-10-CM

## 2018-09-01 HISTORY — PX: BIOPSY: SHX5522

## 2018-09-01 HISTORY — PX: COLONOSCOPY WITH PROPOFOL: SHX5780

## 2018-09-01 LAB — TYPE AND SCREEN
ABO/RH(D): O POS
Antibody Screen: NEGATIVE
Unit division: 0

## 2018-09-01 LAB — CBC
HCT: 25 % — ABNORMAL LOW (ref 36.0–46.0)
Hemoglobin: 8 g/dL — ABNORMAL LOW (ref 12.0–15.0)
MCH: 25.8 pg — ABNORMAL LOW (ref 26.0–34.0)
MCHC: 32 g/dL (ref 30.0–36.0)
MCV: 80.6 fL (ref 80.0–100.0)
PLATELETS: 360 10*3/uL (ref 150–400)
RBC: 3.1 MIL/uL — ABNORMAL LOW (ref 3.87–5.11)
RDW: 16.7 % — ABNORMAL HIGH (ref 11.5–15.5)
WBC: 6.5 10*3/uL (ref 4.0–10.5)
nRBC: 0 % (ref 0.0–0.2)

## 2018-09-01 LAB — BPAM RBC
Blood Product Expiration Date: 202004222359
ISSUE DATE / TIME: 202003312343
Unit Type and Rh: 5100

## 2018-09-01 LAB — HIV ANTIBODY (ROUTINE TESTING W REFLEX): HIV Screen 4th Generation wRfx: NONREACTIVE

## 2018-09-01 LAB — BASIC METABOLIC PANEL
Anion gap: 8 (ref 5–15)
BUN: 5 mg/dL — ABNORMAL LOW (ref 6–20)
CO2: 22 mmol/L (ref 22–32)
Calcium: 8.4 mg/dL — ABNORMAL LOW (ref 8.9–10.3)
Chloride: 108 mmol/L (ref 98–111)
Creatinine, Ser: 0.79 mg/dL (ref 0.44–1.00)
GFR calc Af Amer: >60 mL/min (ref >60)
GFR calc non Af Amer: >60 mL/min (ref >60)
Glucose, Bld: 100 mg/dL — ABNORMAL HIGH (ref 70–99)
Potassium: 3.2 mmol/L — ABNORMAL LOW (ref 3.5–5.1)
Sodium: 138 mmol/L (ref 135–145)

## 2018-09-01 SURGERY — COLONOSCOPY WITH PROPOFOL
Anesthesia: Monitor Anesthesia Care

## 2018-09-01 MED ORDER — SODIUM CHLORIDE 0.9 % IV SOLN
510.0000 mg | Freq: Once | INTRAVENOUS | Status: AC
  Administered 2018-09-01: 510 mg via INTRAVENOUS
  Filled 2018-09-01 (×2): qty 17

## 2018-09-01 MED ORDER — POTASSIUM CHLORIDE 10 MEQ/100ML IV SOLN
10.0000 meq | Freq: Once | INTRAVENOUS | Status: AC
  Administered 2018-09-01: 10 meq via INTRAVENOUS
  Filled 2018-09-01: qty 100

## 2018-09-01 MED ORDER — LACTATED RINGERS IV SOLN
INTRAVENOUS | Status: DC
  Administered 2018-09-01: 13:00:00 via INTRAVENOUS

## 2018-09-01 MED ORDER — HYDROCORTISONE ACETATE 25 MG RE SUPP
25.0000 mg | Freq: Two times a day (BID) | RECTAL | Status: DC
  Administered 2018-09-01 – 2018-09-02 (×3): 25 mg via RECTAL
  Filled 2018-09-01 (×3): qty 1

## 2018-09-01 MED ORDER — PROPOFOL 10 MG/ML IV BOLUS
INTRAVENOUS | Status: DC | PRN
  Administered 2018-09-01: 30 mg via INTRAVENOUS
  Administered 2018-09-01: 20 mg via INTRAVENOUS
  Administered 2018-09-01 (×2): 30 mg via INTRAVENOUS
  Administered 2018-09-01: 20 mg via INTRAVENOUS

## 2018-09-01 MED ORDER — PROPOFOL 500 MG/50ML IV EMUL
INTRAVENOUS | Status: DC | PRN
  Administered 2018-09-01: 75 ug/kg/min via INTRAVENOUS

## 2018-09-01 MED ORDER — POTASSIUM CHLORIDE 10 MEQ/100ML IV SOLN
10.0000 meq | INTRAVENOUS | Status: AC
  Administered 2018-09-01 (×3): 10 meq via INTRAVENOUS
  Filled 2018-09-01 (×3): qty 100

## 2018-09-01 SURGICAL SUPPLY — 22 items

## 2018-09-01 NOTE — Op Note (Signed)
[facility] Patient Name: Leah Baker Procedure Date : 09/01/2018 MRN: [id number] Attending MD: Otis Brace , MD Date of Birth: 09/12/1980 CSN: [id number] Age: 38 Admit Type: Inpatient Procedure:                Colonoscopy Indications:              This is the patient's first colonoscopy, Rectal                            bleeding Providers:                Otis Brace, MD, Raynelle Bring, RN, William Dalton, Technician Referring MD:              Medicines:                Sedation Administered by an Anesthesia Professional Complications:            No immediate complications. Estimated Blood Loss:     Estimated blood loss was minimal. Procedure:                Pre-Anesthesia Assessment:                           - Prior to the procedure, a History and Physical                            was performed, and patient medications and                            allergies were reviewed. The patient's tolerance of                            previous anesthesia was also reviewed. The risks                            and benefits of the procedure and the sedation                            options and risks were discussed with the patient.                            All questions were answered, and informed consent                            was obtained. Prior Anticoagulants: The patient has                            taken no previous anticoagulant or antiplatelet                            agents. ASA Grade Assessment: II - A patient with  mild systemic disease. After reviewing the risks                            and benefits, the patient was deemed in                            satisfactory condition to undergo the procedure.                           After obtaining informed consent, the colonoscope                            was passed under direct vision. Throughout the                            procedure, the  patient's blood pressure, pulse, and                            oxygen saturations were monitored continuously. The                            [id number]-[id number] ([id number]) Olympus pediatric colonscope                            was introduced through the anus and advanced to the                            terminal ileum, with identification of the                            appendiceal orifice and IC valve. The colonoscopy                            was performed without difficulty. The patient                            tolerated the procedure well. The quality of the                            bowel preparation was good except the sigmoid colon                            was fair. Scope In: 1:21:02 PM Scope Out: 1:42:35 PM Scope Withdrawal Time: 0 hours 13 minutes 43 seconds  Total Procedure Duration: 0 hours 21 minutes 33 seconds  Findings:      Skin tags were found on perianal exam.      Hemorrhoids were found on perianal exam.      The terminal ileum appeared normal.      Normal mucosa was found in the entire colon. Biopsies were taken with a       cold forceps for histology.      Bleeding ( small oozing of blood from ) internal hemorrhoids were found       during retroflexion. The hemorrhoids were moderate. Impression:               -  Perianal skin tags found on perianal exam.                           - Hemorrhoids found on perianal exam.                           - The examined portion of the ileum was normal.                           - Normal mucosa in the entire examined colon.                            Biopsied.                           - Bleeding internal hemorrhoids. Recommendation:           - Return patient to hospital ward for ongoing care.                           - Resume regular diet.                           - Continue present medications.                           - Await pathology results.                           - Anusol (pramoxine) suppository: Insert rectally                             BID for 2 weeks. Procedure Code(s):        --- Professional ---                           [id number], Colonoscopy, flexible; with biopsy, single                            or multiple Diagnosis Code(s):        --- Professional ---                           K64.8, Other hemorrhoids                           K64.4, Residual hemorrhoidal skin tags                           K62.5, Hemorrhage of anus and rectum CPT copyright 2019 [employer]. All rights reserved. The codes documented in this report are preliminary and upon coder review may  be revised to meet current compliance requirements. Otis Brace, MD Otis Brace, MD 09/01/2018 1:57:14 PM Number of Addenda: 0

## 2018-09-01 NOTE — Discharge Summary (Signed)
Name: Leah Baker MRN: [id number] [name]: 1980/11/22 38 y.o. PCP: No primary care provider on file.  Date of Admission: 08/31/2018  8:09 AM Date of Discharge:  Attending Physician: Aldine Contes, MD  Discharge Diagnosis: 1.  Acute lower GI bleed secondary to internal hemorrhoids 2.  Iron deficiency anemia  Discharge Medications: Allergies as of 09/02/2018   No Known Allergies     Medication List    TAKE these medications   ferrous sulfate 325 (65 FE) MG EC tablet Take 1 tablet (325 mg total) by mouth daily. What changed:  when to take this   FLUoxetine 20 MG capsule Commonly known as:  PROZAC Take 1 capsule (20 mg total) by mouth daily.   hydrocortisone 2.5 % rectal cream Commonly known as:  Anusol-HC Place 1 application rectally 2 (two) times daily.   hydrocortisone 25 MG suppository Commonly known as:  ANUSOL-HC Place 1 suppository (25 mg total) rectally 2 (two) times daily.   Preparation H 0.25-88.44 % suppository Generic drug:  shark liver oil-cocoa butter Place 1 suppository rectally as needed for hemorrhoids.   WOMENS MULTI VITAMIN & MINERAL PO Take 1 tablet by mouth daily.       Disposition and follow-up:   Leah Baker was discharged from [facility] in [place] condition.  At the hospital follow up visit please address:  1.  Acute lower GI bleed secondary to internal hemorrhoids: Please ensure patient is compliant with Anusol suppository twice daily       Iron deficiency anemia: Please ensure she is compliant with oral iron supplements.  Incidental finding on CT scan:  1. New right iliac and right inguinal lymphadenopathy of uncertain etiology. While a reactive/inflammatory etiology is possible, metastatic disease is also a concern. 2. Normal appendix. 3. 1.1 cm hypervascular lesion versus perfusion anomaly in the left hepatic lobe. Nonemergent abdominal MRI (without and with contrast) is recommended for further evaluation.   2.  Labs / imaging needed at time of follow-up: CBC  3.  Pending labs/ test needing follow-up: Biopsies from colonoscopy  Follow-up Appointments: Follow-up [facility] up.   Why:  follow up appointment September 08, 2018 at 0850 am  Contact information: [address] [place] [id number] [phone]          Hospital Course by problem list: 1.  Acute lower GI bleed secondary to internal hemorrhoids: Leah Baker is a 38 year old African-American woman with no pertinent medical history who presented to [facility] emergency department following an episode of bright red blood per rectum with progressive fatigue and dyspnea on exertion.  On arrival to the ED, she was noted to be tachycardic however she was hemodynamically stable.  CBC in the ED revealed hemoglobin of 7.8, though her baseline is around 10.7.  She continued to have bright red blood per rectum with decrease in hemoglobin down to 6.7 and received 1 unit transfusion of packed red blood cell.  She subsequently underwent colonoscopy which revealed bleeding internal hemorrhoids.  The bright red blood per rectum ceased after colonoscopy and she was advised to start Anusol suppository rectally twice daily for 2 weeks.  2.  Iron deficiency anemia: Iron studies revealed iron level of 14, low saturation of 3, low ferritin of 3.  She received IV Feraheme during her admission and was advised to start oral iron supplements daily.   Discharge Vitals:   BP 118/75 (BP Location: Right Arm)  Pulse 72   Temp 98.3 F (36.8 C) (Oral)   Resp 16   Ht 5\' 2"  (1.575 m)   Wt 73.5 kg   LMP 08/01/2018   SpO2 100%   BMI 29.63 kg/m   Pertinent Labs, Studies, and Procedures:   CT abdomen and pelvis with contrast 1. New right iliac and right inguinal lymphadenopathy of uncertain etiology. While a reactive/inflammatory etiology is possible, metastatic disease is also a concern.  2. Normal appendix. 3. 1.1 cm hypervascular lesion versus perfusion anomaly in the left hepatic lobe. Nonemergent abdominal MRI (without and with contrast) is recommended for further evaluation.  Colonoscopy - Perianal skin tags found on perianal exam. - Hemorrhoids found on perianal exam. - The examined portion of the ileum was normal. - Normal mucosa in the entire examined colon. Biopsied. - Bleeding internal hemorrhoids.  CBC Latest Ref Rng & Units 09/02/2018 09/01/2018 08/31/2018  WBC 4.0 - 10.5 K/uL 8.9 6.5 -  Hemoglobin 12.0 - 15.0 g/dL 8.6(L) 8.0(L) 6.7(LL)  Hematocrit 36.0 - 46.0 % 26.9(L) 25.0(L) 21.7(L)  Platelets 150 - 400 K/uL 383 360 -    Discharge Instructions: Discharge Instructions    Call MD for:  extreme fatigue   Complete by:  As directed    Call MD for:  persistant dizziness or light-headedness   Complete by:  As directed    Call MD for:  persistant nausea and vomiting   Complete by:  As directed    Diet - low sodium heart healthy   Complete by:  As directed    Discharge instructions   Complete by:  As directed    Leah Baker,   It was a pleasure taking care of you here at the hospital.  You were admitted because of GI bleeding.  After your colonoscopy, we found that you had a moderate sized internal hemorrhoids.  I have given you a prescription for Anusol suppository (used twice a day) as well as oral iron supplements (take 1 pill once a day).  I have made an appointment with you to see [place] at the internal medicine clinic on September 09, 2018 at 10:45 AM.  The address is below [address], [place] [address]  You should receive a call about this appointment.  Take care   Increase activity slowly   Complete by:  As directed       Signed: Jean Rosenthal, MD 09/02/2018, 12:27 PM   Pager: [phone] IMTS PGY-1

## 2018-09-01 NOTE — Progress Notes (Signed)
   Subjective: HD#1   Overnight, she drank the bowel prep solution for colonoscopy today.   Today, Ms. Czarnecki states that she continued having bright red blood per rectum overnight. She states that the blood is separate from the stool. She feels tired today because she was up overnight going to the bathroom. She denies any family history of colon cancer. Denies lightheadedness or shortness of breath. Discussed plan for colonoscopy today and iron transfusion.   Objective:  Vital signs in last 24 hours: Vitals:   08/31/18 2330 09/01/18 0000 09/01/18 0245 09/01/18 0452  BP: 107/69 110/84 115/87 113/78  Pulse: 71 73 73 72  Resp: 20 18 16 18   Temp: 98.5 F (36.9 C) 98.4 F (36.9 C) 98.1 F (36.7 C) 98.2 F (36.8 C)  TempSrc: Oral Oral Oral Oral  SpO2: 100% 100% 100% 99%  Weight:      Height:       Constitutional: In no apparent distress, lying comfortably in bed Respiratory: Clear to auscultation bilaterally, no wheezes, crackles, rhonchi Cardiovascular: RRR, no murmurs, gallops, rubs Abdomen: Bowel sounds present, nontender to palpation. Extremity: No lower extremity edema   Assessment/Plan:  Active Problems:   Symptomatic anemia   Lower GI bleed  Ms. Korber is a 38 y/o otherwise healthy female who presents with 1 week history of BRBPR with progressive fatigue and DOE.  Noted to have symptomatic anemia with hemoglobin of 7.8.  CT of the abdomen and pelvis showed right iliac and inguinal LAD, but otherwise unremarkable.  Acute blood loss anemia secondary to GI bleed: Overnight, reports that she continued to have bright red blood per rectum however denies hemoptysis, hematemesis.  Also continues to endorse fatigue with no improvement since admission.  She denies any history of GI malignancies in her family however endorses throat cancer in grandmother and liver cancer in mother.  Overnight hemoglobin dropped to 6.7 and received transfusion of 1 PRBC.  Pressure diagnosis includes  diverticular bleed versus AVM versus vascular polyp versus malignancy versus hemorrhoids. -Colonoscopy today -Appreciate GI recommendation -Continue to monitor H&H (diffuse if hemoglobin<7) -Will start PPI after colonoscopy  Iron deficiency anemia: Iron studies reviewed iron 14, low saturation 3, low ferritin of 3. -Will administer IV Feraheme after colonoscopy -Continue to monitor  Hypokalemia: K+ 3.2 -Will replete    FEN: Replace electrolytes as needed VTE ppx: SCDs CODE STATUS: Full code  Dispo: Anticipated discharge in approximately 1-2 day(s).   Jean Rosenthal, MD 09/01/2018, 6:30 AM Pager: [phone] IMTS PGY-1

## 2018-09-01 NOTE — Anesthesia Preprocedure Evaluation (Addendum)
Anesthesia Evaluation  Patient identified by MRN, date of birth, ID band Patient awake    Reviewed: Allergy & Precautions, NPO status , Patient's Chart, lab work & pertinent test results  History of Anesthesia Complications (+) PONV and history of anesthetic complications  Airway Mallampati: II  TM Distance: >3 FB     Dental   Pulmonary neg pulmonary ROS, former smoker,    breath sounds clear to auscultation       Cardiovascular negative cardio ROS   Rhythm:Regular Rate:Normal     Neuro/Psych PSYCHIATRIC DISORDERS Depression negative neurological ROS     GI/Hepatic negative GI ROS, Neg liver ROS,   Endo/Other  negative endocrine ROS  Renal/GU negative Renal ROS  negative genitourinary   Musculoskeletal negative musculoskeletal ROS (+)   Abdominal   Peds  Hematology  (+) Blood dyscrasia (Hgb 8.0), anemia ,   Anesthesia Other Findings BRBPR  Reproductive/Obstetrics                            Anesthesia Physical Anesthesia Plan  ASA: II  Anesthesia Plan: MAC   Post-op Pain Management:    Induction: Intravenous  PONV Risk Score and Plan: 3 and Propofol infusion and Treatment may vary due to age or medical condition  Airway Management Planned: Natural Airway and Simple Face Mask  Additional Equipment:   Intra-op Plan:   Post-operative Plan:   Informed Consent: I have reviewed the patients History and Physical, chart, labs and discussed the procedure including the risks, benefits and alternatives for the proposed anesthesia with the patient or authorized representative who has indicated his/her understanding and acceptance.     Dental advisory given  Plan Discussed with: CRNA  Anesthesia Plan Comments:         Anesthesia Quick Evaluation

## 2018-09-01 NOTE — Plan of Care (Signed)
  Problem: Clinical Measurements: Goal: Diagnostic test results will improve Outcome: Progressing   Problem: Activity: Goal: Risk for activity intolerance will decrease Outcome: Progressing   Problem: Pain Managment: Goal: General experience of comfort will improve Outcome: Progressing   

## 2018-09-01 NOTE — Plan of Care (Signed)
  Problem: Health Behavior/Discharge Planning: Goal: Ability to manage health-related needs will improve Outcome: Progressing   Problem: Clinical Measurements: Goal: Ability to maintain clinical measurements within normal limits will improve Outcome: Progressing Goal: Will remain free from infection Outcome: Progressing   

## 2018-09-01 NOTE — Brief Op Note (Signed)
08/31/2018 - 09/01/2018  1:57 PM  PATIENT:  Leah Baker  38 y.o. female  PRE-OPERATIVE DIAGNOSIS:  Rectal bleeding  POST-OPERATIVE DIAGNOSIS:  biopsies r/o colitis  PROCEDURE:  Procedure(s): COLONOSCOPY WITH PROPOFOL (N/A) BIOPSY  SURGEON:  Surgeon(s) and Role:    * Azaliah Carrero, MD - Primary   Findings ---------- -No evidence of active bleeding inside the colon or in the terminal ileum.  Slow  oozing from internal hemorrhoids noted on retroflexion.  Recommendations ------------------------- -Bleeding most likely from internal hemorrhoids.  Start Anusol HC suppository twice a day for 2 weeks -Advance diet -Okay to discharge from GI standpoint.  GI will sign off.  Call us back if needed  Otis Brace MD, [place] 09/01/2018, 1:59 PM  Contact #  [phone]

## 2018-09-01 NOTE — Transfer of Care (Signed)
Immediate Anesthesia Transfer of Care Note  Patient: SHAYONNA OCAMPO  Procedure(s) Performed: COLONOSCOPY WITH PROPOFOL (N/A ) BIOPSY  Patient Location: PACU and Endoscopy Unit  Anesthesia Type:MAC  Level of Consciousness: awake, alert  and oriented  Airway & Oxygen Therapy: Patient Spontanous Breathing  Post-op Assessment: Report given to RN and Post -op Vital signs reviewed and stable  Post vital signs: Reviewed and stable  Last Vitals:  Vitals Value Taken Time  BP 113/65 09/01/2018  1:48 PM  Temp 36.9 C 09/01/2018  1:48 PM  Pulse 79 09/01/2018  1:48 PM  Resp 16 09/01/2018  1:48 PM  SpO2 100 % 09/01/2018  1:48 PM    Last Pain:  Vitals:   09/01/18 1348  TempSrc: Oral  PainSc: 0-No pain         Complications: No apparent anesthesia complications

## 2018-09-01 NOTE — Anesthesia Postprocedure Evaluation (Signed)
Anesthesia Post Note  Patient: Leah Baker  Procedure(s) Performed: COLONOSCOPY WITH PROPOFOL (N/A ) BIOPSY     Patient location during evaluation: PACU Anesthesia Type: MAC Level of consciousness: awake and alert Pain management: pain level controlled Vital Signs Assessment: post-procedure vital signs reviewed and stable Respiratory status: spontaneous breathing, nonlabored ventilation, respiratory function stable and patient connected to nasal cannula oxygen Cardiovascular status: stable and blood pressure returned to baseline Postop Assessment: no apparent nausea or vomiting Anesthetic complications: no    Last Vitals:  Vitals:   09/01/18 1400 09/01/18 1424  BP: (!) 168/91 127/84  Pulse: (!) 102 74  Resp: 18 16  Temp:  36.8 C  SpO2: 91% 100%    Last Pain:  Vitals:   09/01/18 1424  TempSrc: Oral  PainSc:                  Tiajuana Amass

## 2018-09-01 NOTE — Anesthesia Procedure Notes (Signed)
Date/Time: 09/01/2018 1:13 PM Performed by: Trinna Post., CRNA Pre-anesthesia Checklist: Patient identified, Emergency Drugs available, Suction available, Timeout performed and Patient being monitored Patient Re-evaluated:Patient Re-evaluated prior to induction Oxygen Delivery Method: Nasal cannula Preoxygenation: Pre-oxygenation with 100% oxygen Induction Type: IV induction Placement Confirmation: positive ETCO2

## 2018-09-02 LAB — CBC
HCT: 26.9 % — ABNORMAL LOW (ref 36.0–46.0)
Hemoglobin: 8.6 g/dL — ABNORMAL LOW (ref 12.0–15.0)
MCH: 25.6 pg — ABNORMAL LOW (ref 26.0–34.0)
MCHC: 32 g/dL (ref 30.0–36.0)
MCV: 80.1 fL (ref 80.0–100.0)
Platelets: 383 10*3/uL (ref 150–400)
RBC: 3.36 MIL/uL — ABNORMAL LOW (ref 3.87–5.11)
RDW: 16.2 % — ABNORMAL HIGH (ref 11.5–15.5)
WBC: 8.9 10*3/uL (ref 4.0–10.5)
nRBC: 0 % (ref 0.0–0.2)

## 2018-09-02 LAB — BASIC METABOLIC PANEL
Anion gap: 5 (ref 5–15)
BUN: 7 mg/dL (ref 6–20)
CO2: 26 mmol/L (ref 22–32)
Calcium: 9 mg/dL (ref 8.9–10.3)
Chloride: 105 mmol/L (ref 98–111)
Creatinine, Ser: 0.87 mg/dL (ref 0.44–1.00)
GFR calc Af Amer: >60 mL/min (ref >60)
GFR calc non Af Amer: >60 mL/min (ref >60)
Glucose, Bld: 109 mg/dL — ABNORMAL HIGH (ref 70–99)
Potassium: 4.1 mmol/L (ref 3.5–5.1)
Sodium: 136 mmol/L (ref 135–145)

## 2018-09-02 MED ORDER — HYDROCORTISONE ACETATE 25 MG RE SUPP: 25.0000 mg | Freq: Two times a day (BID) | RECTAL | 0 refills | Status: DC

## 2018-09-02 MED ORDER — FERROUS SULFATE 325 (65 FE) MG PO TABS
325.0000 mg | ORAL_TABLET | Freq: Every day | ORAL | Status: DC
  Administered 2018-09-02: 08:00:00 325 mg via ORAL
  Filled 2018-09-02: qty 1

## 2018-09-02 MED ORDER — FERROUS SULFATE 325 (65 FE) MG PO TBEC: 325.0000 mg | DELAYED_RELEASE_TABLET | Freq: Every day | ORAL | 3 refills | Status: DC

## 2018-09-02 NOTE — Progress Notes (Signed)
   Subjective: HD#2   Overnight: No acute events reported  Today she endorses low abdominal pain with bowel movements. Described as "birthing pain." She has had this pain since admission. We discussed that her pain is likely secondary to known internal hemorrhoids and we are hopeful that the steroid suppository she has been receiving will eventually help with her pain. She has not had any more BMs since yesterday morning.   Discussed plan to discharge home today on po daily or every other day iron supplementation and steroid suppository. Also recommended increased fiber intake and prn miralax to maintain regular daily BMs. Will schedule f/u in our clinic next week for hemoglobin check.   Objective:  Vital signs in last 24 hours: Vitals:   09/01/18 1400 09/01/18 1424 09/01/18 2124 09/02/18 0533  BP: (!) 168/91 127/84 110/82 118/75  Pulse: (!) 102 74 82 72  Resp: 18 16 16 16   Temp:  98.2 F (36.8 C) 98.4 F (36.9 C) 98.3 F (36.8 C)  TempSrc:  Oral Oral Oral  SpO2: 91% 100% 99% 100%  Weight:      Height:       General: well appearing, alert, interactive, NAD HEENT: Atraumatic, normocephalic Respiratory: CTA BL, no wheezes, crackles, rhonchi Cardiovascular: RRR, no murmurs, gallops, rubs Abdomen: soft, NT, ND  Assessment/Plan:  Active Problems:   Symptomatic anemia   Lower GI bleed  Leah Baker is a 38 y/o otherwise healthy female who presents with 1 week history of BRBPR with progressive fatigue and DOE.  Noted to have symptomatic anemia with hemoglobin of 7.8 in the ED.  CT of the abdomen and pelvis showed right iliac and inguinal LAD, but otherwise unremarkable.  Acute lower GI bleed secondary to internal hemorrhoids: She successfully underwent colonoscopy on September 01, 2018 which revealed slow oozing internal hemorrhoids.  A biopsy was taken to rule out colitis.  Her hemoglobin is stable today at 8.6. - Advance diet as tolerated - Most likely discharge today with Anusol  suppository twice daily for 2 weeks.  Iron deficiency anemia: She is status post IV Feraheme yesterday.  Definitely requires oral iron supplements given her low ferritin, iron and saturation. -Start oral iron supplementation.  FEN: Replace electrolytes as needed, regular diet VTE ppx: SCDs CODE STATUS: Full code  Dispo: Anticipated discharge in approximately today.  Jean Rosenthal, MD 09/02/2018, 6:16 AM Pager: [phone] IMTS PGY-1

## 2018-09-06 NOTE — Progress Notes (Addendum)
Subjective:    Patient ID: Leah Baker, female    [name]: Oct 25, 1980, 38 y.o.   MRN: [id number]  37 y.o.F seen in post hospital follow-up for lower GI bleeding secondary to internal hemorrhoids and associated iron deficiency anemia.  The patient is new to this practice and this is a post hospital visit  The patient was admitted on 31 March discharged on 2 April secondary to noting bright red blood per rectum with bowel movements.  Patient has associated dizziness and lightheadedness.  She required a 1 unit packed red cell transfusion.  She was discharged on Anusol suppositories and external cream along with iron supplements.  Hemoglobin was as low as 6.7 and she did receive a 1 unit transfusion and upon discharge hemoglobin was increased to 8.0.  The patient states upon discharge she returned to having some bleeding with each bowel movement.  Her stools are soft at this time.  She is using MiraLAX for this.  She also complains of right lower quadrant pain.  A CT scan of the abdomen done during this hospitalization revealed inflammatory lymph nodes in the right lower quadrant area in the iliac and right inguinal location.  This is of uncertain etiology. Discharge summary excerpts are as belowHospital Course by problem list: 1.  Acute lower GI bleed secondary to internal hemorrhoids: Ms. Sanjurjo is a 38 year old African-American woman with no pertinent medical history who presented to [facility] emergency department following an episode of bright red blood per rectum with progressive fatigue and dyspnea on exertion.  On arrival to the ED, she was noted to be tachycardic however she was hemodynamically stable.  CBC in the ED revealed hemoglobin of 7.8, though her baseline is around 10.7.  She continued to have bright red blood per rectum with decrease in hemoglobin down to 6.7 and received 1 unit transfusion of packed red blood cell.  She subsequently underwent colonoscopy which revealed bleeding internal  hemorrhoids.  The bright red blood per rectum ceased after colonoscopy and she was advised to start Anusol suppository rectally twice daily for 2 weeks.  2.  Iron deficiency anemia: Iron studies revealed iron level of 14, low saturation of 3, low ferritin of 3.  She received IV Feraheme during her admission and was advised to start oral iron supplements daily.  The patient denies any chest pain or shortness of breath.  There is no nausea or vomiting.    Past Medical History:  Diagnosis Date  . ACL tear 08/2013   left knee  . Anemia    takes iron supplement   . Medial meniscus tear 08/2013   left knee  . PONV (postoperative nausea and vomiting)    only happened once     No family history on file.   Social History   Socioeconomic History  . Marital status: Single    Spouse name: Not on file  . Number of children: Not on file  . Years of education: Not on file  . Highest education level: Not on file  Occupational History  . Not on file  Social Needs  . Financial resource strain: Not on file  . Food insecurity:    Worry: Not on file    Inability: Not on file  . Transportation needs:    Medical: Not on file    Non-medical: Not on file  Tobacco Use  . Smoking status: Former Smoker    Last attempt to quit: 08/22/2008    Years since quitting: 10.0  .  Smokeless tobacco: Never Used  Substance and Sexual Activity  . Alcohol use: Yes    Comment: occasionally  . Drug use: No  . Sexual activity: Yes  Lifestyle  . Physical activity:    Days per week: Not on file    Minutes per session: Not on file  . Stress: Not on file  Relationships  . Social connections:    Talks on phone: Not on file    Gets together: Not on file    Attends religious service: Not on file    Active member of club or organization: Not on file    Attends meetings of clubs or organizations: Not on file    Relationship status: Not on file  . Intimate partner violence:    Fear of current or ex partner: Not  on file    Emotionally abused: Not on file    Physically abused: Not on file    Forced sexual activity: Not on file  Other Topics Concern  . Not on file  Social History Narrative  . Not on file     No Known Allergies   Outpatient Medications Prior to Visit  Medication Sig Dispense Refill  . ferrous sulfate 325 (65 FE) MG EC tablet Take 1 tablet (325 mg total) by mouth daily. 90 tablet 3  . Multiple Vitamins-Minerals (WOMENS MULTI VITAMIN & MINERAL PO) Take 1 tablet by mouth daily.    . polyethylene glycol (MIRALAX / GLYCOLAX) packet Take 17 g by mouth daily. 1/2 packet daily    . shark liver oil-cocoa butter (PREPARATION H) 0.25-88.44 % suppository Place 1 suppository rectally as needed for hemorrhoids.    . hydrocortisone (ANUSOL-HC) 25 MG suppository Place 1 suppository (25 mg total) rectally 2 (two) times daily. 12 suppository 0  . FLUoxetine (PROZAC) 20 MG capsule Take 1 capsule (20 mg total) by mouth daily. (Patient not taking: Reported on 08/31/2018) 30 capsule 2  . hydrocortisone (ANUSOL-HC) 2.5 % rectal cream Place 1 application rectally 2 (two) times daily. 30 g 0   No facility-administered medications prior to visit.       Review of Systems  Constitutional: Positive for fatigue. Negative for chills, diaphoresis and fever.  HENT: Negative.   Eyes: Negative.   Respiratory: Negative.   Cardiovascular: Negative.   Gastrointestinal: Positive for abdominal pain, anal bleeding and blood in stool.       RLQ pain   Genitourinary: Negative.   Musculoskeletal: Negative.   Neurological: Positive for dizziness, weakness and light-headedness. Negative for seizures.  Psychiatric/Behavioral: Negative for self-injury and suicidal ideas. The patient is nervous/anxious.        Objective:   Physical Exam  Vitals:   09/07/18 0850  BP: 110/75  Pulse: 88  Resp: 17  Temp: 98.2 F (36.8 C)  TempSrc: Oral  SpO2: 99%  Weight: 161 lb 3.2 oz (73.1 kg)  Height: 5\' 2"  (1.575 m)     Gen: Pleasant, well-nourished, in no distress,  normal affect  ENT: No lesions,  mouth clear,  oropharynx clear, no postnasal drip  Neck: No JVD, no TMG, no carotid bruits  Lungs: No use of accessory muscles, no dullness to percussion, clear without rales or rhonchi  Cardiovascular: RRR, heart sounds normal, no murmur or gallops, no peripheral edema  Abdomen: soft ,Mild tenderness in right lower quadrant  , no HSM,  BS normal  Musculoskeletal: No deformities, no cyanosis or clubbing  Neuro: alert, non focal  Skin: Warm, no lesions or rashes  CBC Latest  Ref Rng & Units 09/02/2018 09/01/2018 08/31/2018  WBC 4.0 - 10.5 K/uL 8.9 6.5 -  Hemoglobin 12.0 - 15.0 g/dL 8.6(L) 8.0(L) 6.7(LL)  Hematocrit 36.0 - 46.0 % 26.9(L) 25.0(L) 21.7(L)  Platelets 150 - 400 K/uL 383 360 -   CT abdomen and pelvis with contrast 1. New right iliac and right inguinal lymphadenopathy of uncertain etiology. While a reactive/inflammatory etiology is possible, metastatic disease is also a concern. 2. Normal appendix. 3. 1.1 cm hypervascular lesion versus perfusion anomaly in the left hepatic lobe. Nonemergent abdominal MRI (without and with contrast) is recommended for further evaluation.  Colonoscopy - Perianal skin tags found on perianal exam. - Hemorrhoids found on perianal exam. - The examined portion of the ileum was normal. - Normal mucosa in the entire examined colon. Biopsied. - Bleeding internal hemorrhoids.       Assessment & Plan:  I personally reviewed all images and lab data in the [facility] system as well as any outside material available during this office visit and agree with the  radiology impressions.   Lower GI bleed Significant lower gastrointestinal bleeding secondary to internal hemorrhoids persisting in nature  Lower quadrant right-sided abdominal pain is of some concern with inflammatory changes seen on CT scan of abdomen  I spoke to the gastroenterologist who performed a  colonoscopy and he is recommending continued iron sulfate Anusol and observations if bleeding worsens the patient may need to return to the hospital  There may be an element of diverticulosis in this patient   Hemorrhoid Hemorrhoidal bleeding as noted note she has had this problem previously  Symptomatic anemia Recurrent symptomatic anemia for this the patient will maintain iron supplementation

## 2018-09-07 ENCOUNTER — Ambulatory Visit: Payer: Self-pay | Attending: Critical Care Medicine | Admitting: Critical Care Medicine

## 2018-09-07 ENCOUNTER — Other Ambulatory Visit: Payer: Self-pay

## 2018-09-07 ENCOUNTER — Encounter: Payer: Self-pay | Admitting: Critical Care Medicine

## 2018-09-07 VITALS — BP 110/75 | HR 88 | Temp 98.2°F | Resp 17 | Ht 62.0 in | Wt 161.2 lb

## 2018-09-07 DIAGNOSIS — K922 Gastrointestinal hemorrhage, unspecified: Secondary | ICD-10-CM

## 2018-09-07 DIAGNOSIS — D649 Anemia, unspecified: Secondary | ICD-10-CM

## 2018-09-07 DIAGNOSIS — K649 Unspecified hemorrhoids: Secondary | ICD-10-CM

## 2018-09-07 MED ORDER — HYDROCORTISONE ACETATE 25 MG RE SUPP: 25.0000 mg | Freq: Two times a day (BID) | RECTAL | 0 refills | Status: DC

## 2018-09-07 NOTE — Assessment & Plan Note (Signed)
Significant lower gastrointestinal bleeding secondary to internal hemorrhoids persisting in nature  Lower quadrant right-sided abdominal pain is of some concern with inflammatory changes seen on CT scan of abdomen  I spoke to the gastroenterologist who performed a colonoscopy and he is recommending continued iron sulfate Anusol and observations if bleeding worsens the patient may need to return to the hospital  There may be an element of diverticulosis in this patient

## 2018-09-07 NOTE — Assessment & Plan Note (Signed)
Hemorrhoidal bleeding as noted note she has had this problem previously

## 2018-09-07 NOTE — Assessment & Plan Note (Signed)
Recurrent symptomatic anemia for this the patient will maintain iron supplementation

## 2018-09-07 NOTE — Patient Instructions (Signed)
Refills on Anusol suppository was sent to your [place] to use the MiraLAX to keep your stools soft  Stay on iron supplement and you may crush the tablet but did need applesauce  Labs today will include a complete blood count and also body chemistry  We will call you with your lab results  A telephone visit will be scheduled for next Monday, 13 April

## 2018-09-08 ENCOUNTER — Telehealth: Payer: Self-pay | Admitting: *Deleted

## 2018-09-08 ENCOUNTER — Inpatient Hospital Stay: Payer: Self-pay

## 2018-09-08 LAB — BASIC METABOLIC PANEL
BUN/Creatinine Ratio: 10 (ref 9–23)
BUN: 8 mg/dL (ref 6–20)
CO2: 24 mmol/L (ref 20–29)
Calcium: 9.7 mg/dL (ref 8.7–10.2)
Chloride: 102 mmol/L (ref 96–106)
Creatinine, Ser: 0.83 mg/dL (ref 0.57–1.00)
GFR calc Af Amer: 103 mL/min/{1.73_m2} (ref >59)
GFR calc non Af Amer: 89 mL/min/{1.73_m2} (ref >59)
Glucose: 87 mg/dL (ref 65–99)
Potassium: 4.4 mmol/L (ref 3.5–5.2)
Sodium: 140 mmol/L (ref 134–144)

## 2018-09-08 LAB — CBC WITH DIFFERENTIAL/PLATELET
Basophils Absolute: 0.1 10*3/uL (ref 0.0–0.2)
Basos: 1 %
EOS (ABSOLUTE): 0.2 10*3/uL (ref 0.0–0.4)
Eos: 2 %
Hematocrit: 29.8 % — ABNORMAL LOW (ref 34.0–46.6)
Hemoglobin: 8.9 g/dL — ABNORMAL LOW (ref 11.1–15.9)
Immature Grans (Abs): 0.1 10*3/uL (ref 0.0–0.1)
Immature Granulocytes: 1 %
Lymphocytes Absolute: 3.1 10*3/uL (ref 0.7–3.1)
Lymphs: 33 %
MCH: 25.5 pg — ABNORMAL LOW (ref 26.6–33.0)
MCHC: 29.9 g/dL — ABNORMAL LOW (ref 31.5–35.7)
MCV: 85 fL (ref 79–97)
Monocytes Absolute: 0.9 10*3/uL (ref 0.1–0.9)
Monocytes: 10 %
Neutrophils Absolute: 5.1 10*3/uL (ref 1.4–7.0)
Neutrophils: 53 %
Platelets: 464 10*3/uL — ABNORMAL HIGH (ref 150–450)
RBC: 3.49 x10E6/uL — ABNORMAL LOW (ref 3.77–5.28)
RDW: 16.2 % — ABNORMAL HIGH (ref 11.7–15.4)
WBC: 9.4 10*3/uL (ref 3.4–10.8)

## 2018-09-08 NOTE — Telephone Encounter (Signed)
This is all I can give her until she sees the GI MD.  No narcotics

## 2018-09-08 NOTE — Telephone Encounter (Signed)
Patient verified [name] Patient is aware of no transfusion being needed and to continue with current iron supplement. Patient complains of a pain in her side that keeps her from resting at night.  Patient sees GI specialist at Internal medicine tomorrow and will inquire about pain medication.  Patient states currently she is alternating tylenol and ibuprofen with no relief. Patient is aware of not using the ibuprofen due to the bleed but she is taking 3 500mg  tylenol tablets a day.

## 2018-09-08 NOTE — Telephone Encounter (Signed)
-----   Message from Elsie Stain, MD sent at 09/08/2018  1:34 PM EDT ----- Leodis Sias ,pls call ms Mcclatchey and let her know blood counts not changed since hosp d/c.  No need for any blood transfusion.  Stay on iron tablet daily and no new recommendations

## 2018-09-09 ENCOUNTER — Ambulatory Visit: Payer: Self-pay

## 2018-09-09 ENCOUNTER — Encounter: Payer: Self-pay | Admitting: Internal Medicine

## 2018-09-09 ENCOUNTER — Other Ambulatory Visit: Payer: Self-pay

## 2018-09-09 ENCOUNTER — Ambulatory Visit (INDEPENDENT_AMBULATORY_CARE_PROVIDER_SITE_OTHER): Payer: Self-pay | Admitting: Internal Medicine

## 2018-09-09 VITALS — BP 110/63 | HR 107 | Temp 98.5°F | Ht 62.0 in | Wt 159.3 lb

## 2018-09-09 DIAGNOSIS — K769 Liver disease, unspecified: Secondary | ICD-10-CM | POA: Insufficient documentation

## 2018-09-09 DIAGNOSIS — D500 Iron deficiency anemia secondary to blood loss (chronic): Secondary | ICD-10-CM

## 2018-09-09 DIAGNOSIS — K648 Other hemorrhoids: Secondary | ICD-10-CM

## 2018-09-09 DIAGNOSIS — D509 Iron deficiency anemia, unspecified: Secondary | ICD-10-CM

## 2018-09-09 DIAGNOSIS — Z87891 Personal history of nicotine dependence: Secondary | ICD-10-CM

## 2018-09-09 DIAGNOSIS — Z79899 Other long term (current) drug therapy: Secondary | ICD-10-CM

## 2018-09-09 DIAGNOSIS — K649 Unspecified hemorrhoids: Secondary | ICD-10-CM

## 2018-09-09 MED ORDER — LIDOCAINE (ANORECTAL) 5 % EX CREA: 1.0000 "application " | TOPICAL_CREAM | Freq: Four times a day (QID) | CUTANEOUS | 2 refills | Status: DC | PRN

## 2018-09-09 NOTE — Patient Instructions (Signed)
Thank you for allowing [place] to provide your care. Today we are:  1) Starting topical lidocaine gel for your pain.   2) Follow-up with GI   3) Scheduling an MRI for your liver   4) Continue your iron as prescribed. Taking it with a glass of orange juice can help to increase absorption  Please let us know if you need anything. Come back to see [place] in 6 months or sooner if anything arises.

## 2018-09-09 NOTE — Assessment & Plan Note (Signed)
HPI: Patient admitted to the hospital for acute GI bleed secondary to internal hemorrhoids. Her Hgb stabilized and started on Anusol. Subsequently discharge to follow-up with GI. She followed up with [facility] yesterday. Hgb is stable based on 09/07/2018 check. She continues to have significant pain. She is unable to sleep at night. Has been alternating Tylenol and Ibuprofen without relief.   A/P: - Continue Anusol  - She will follow-up with GI  - No need for CBC check today  - START topical lidocaine for pain

## 2018-09-09 NOTE — Progress Notes (Signed)
Medicine attending: Medical history, presenting problems,  and medications, reviewed with resident physician Dr Ina Homes on the day of the patient telephone consultation  and I concur with his evaluation and management plan.  Recent admit for LGI bleed felt secondary to bleeding internal hemorrhoids. Rx IV iron; Anusol. Started on daily PO iron this week at [facility] center. Called [place] re persistent rectal pain. We will add topical lidocaine to the anusol. Follow up MRI ordered for likely AVM in liver seen on resent CT abdomen. Pt aware MR won't be done for a few months.

## 2018-09-09 NOTE — Assessment & Plan Note (Signed)
CT abd done during hospitalization illustrated a 1.1 cm vascular lesion. Recommended nonemergent MRI with and without contract. Likely hamartoma.

## 2018-09-09 NOTE — Assessment & Plan Note (Signed)
Patient to continue QD iron.

## 2018-09-09 NOTE — Progress Notes (Signed)
CC: GI bleed 2/2 internal hemorrhoids  HPI:  Leah Baker is a 38 y.o. female with PMHx listed below presenting for GI bleed 2/2 internal hemorrhoids. Please see the A&P for the status of the patient's chronic medical problems.  Past Medical History:  Diagnosis Date  . ACL tear 08/2013   left knee  . Anemia    takes iron supplement   . Lower GI bleed 08/31/2018  . Medial meniscus tear 08/2013   left knee  . PONV (postoperative nausea and vomiting)    only happened once   Past Surgical History:  Procedure Laterality Date  . BIOPSY  09/01/2018   Procedure: BIOPSY;  Surgeon: Otis Brace, MD;  Location: [place];  Service: Gastroenterology;;  . CESAREAN SECTION  01/15/2002  . CESAREAN SECTION  03/17/2011   Procedure: CESAREAN SECTION;  Surgeon: Osborne Oman, MD;  Location: [facility];  Service: Gynecology;  Laterality: N/A;  . COLONOSCOPY WITH PROPOFOL N/A 09/01/2018   Procedure: COLONOSCOPY WITH PROPOFOL;  Surgeon: Otis Brace, MD;  Location: [facility];  Service: Gastroenterology;  Laterality: N/A;  . HYSTEROSCOPY W/D&C  06/21/2009  . IUD REMOVAL  06/21/2009  . MULTIPLE TOOTH EXTRACTIONS     No family history on file.   Social History   Socioeconomic History  . Marital status: Single    Spouse name: Not on file  . Number of children: Not on file  . Years of education: Not on file  . Highest education level: Not on file  Occupational History  . Not on file  Social Needs  . Financial resource strain: Not on file  . Food insecurity:    Worry: Not on file    Inability: Not on file  . Transportation needs:    Medical: Not on file    Non-medical: Not on file  Tobacco Use  . Smoking status: Former Smoker    Last attempt to quit: 08/22/2008    Years since quitting: 10.0  . Smokeless tobacco: Never Used  Substance and Sexual Activity  . Alcohol use: Yes    Comment: occasionally  . Drug use: No  . Sexual activity: Yes  Lifestyle  . Physical activity:     Days per week: Not on file    Minutes per session: Not on file  . Stress: Not on file  Relationships  . Social connections:    Talks on phone: Not on file    Gets together: Not on file    Attends religious service: Not on file    Active member of club or organization: Not on file    Attends meetings of clubs or organizations: Not on file    Relationship status: Not on file  . Intimate partner violence:    Fear of current or ex partner: Not on file    Emotionally abused: Not on file    Physically abused: Not on file    Forced sexual activity: Not on file  Other Topics Concern  . Not on file  Social History Narrative  . Not on file   Review of Systems:  Performed and all others negative.  Physical Exam: Vitals:   09/09/18 1056  BP: 110/63  Pulse: (!) 107  Temp: 98.5 F (36.9 C)  TempSrc: Oral  SpO2: 100%  Weight: 159 lb 4.8 oz (72.3 kg)  Height: 5\' 2"  (1.575 m)   General: Well nourished female in no acute distress HENT: Normocephalic, atraumatic, moist mucus membranes Pulm: Good air movement with no wheezing or  crackles  CV: RRR, no murmurs, no rubs  Abdomen: Active bowel sounds, soft, non-distended, no tenderness to palpation  Extremities: Pulses palpable in all extremities, no LE edema  Skin: Warm and dry  Neuro: Alert and oriented x 3  Assessment & Plan:   See Encounters Tab for problem based charting.  Patient discussed with Dr. Beryle Beams

## 2018-09-11 NOTE — Progress Notes (Signed)
Virtual Visit via Telephone Note  I connected with Leah Baker on 09/11/18 at  9:00 AM EDT by telephone and verified that I am speaking with the correct person using two identifiers.   I discussed the limitations, risks, security and privacy concerns of performing an evaluation and management service by telephone and the availability of in person appointments. I also discussed with the patient that there may be a patient responsible charge related to this service. The patient expressed understanding and agreed to proceed.   History of Present Illness: This is a 38 year old female last seen on 7 April for post hospital follow-up of lower GI bleed.  This is a telephone visit today and I confirmed that I had Leah Baker only on the phone.  At the last visit we determined the patient had lower GI bleeding from internal hemorrhoids and she was on internal Anusol and external Anusol cream.  The patient also had right-sided abdominal pain and a CT scan previously had showed inflammatory changes in inguinal and iliac lymph nodes.  Colonoscopy had been performed while in the hospital and this revealed only internal hemorrhoids.  The patient had follow-up on 9 April in the internal medicine post hospital clinic.  They ordered a follow-up MRI of the liver lesion in 6 months.  The patient was given rectal lidocaine for rectal pain.  The patient states she was really not having rectal pain.  She was primarily having right lower quadrant pain which is worsened.  She does not have an established gastrointestinal appointment.  Note at the last visit her laboratory data was stable her white count was not elevated hemoglobin was stable.  She still having bright red blood with bowel movements.  She has increased fatigue.  There is no fever.  Her stool is loose that she is on MiraLAX.  The pain appears to be getting more progressive and consistent throughout the day. Pos In BOLD  Constitutional:   No  weight loss,  night sweats,  Fevers, chills, fatigue, lassitude. HEENT:   No headaches,  Difficulty swallowing,  Tooth/dental problems,  Sore throat,                No sneezing, itching, ear ache, nasal congestion, post nasal drip,   CV:  No chest pain,  Orthopnea, PND, swelling in lower extremities, anasarca, dizziness, palpitations  GI   heartburn, indigestion, abdominal pain, nausea, vomiting, diarrhea, change in bowel habits, loss of appetite  Resp: No shortness of breath with exertion or at rest.  No excess mucus, no productive cough,  No non-productive cough,  No coughing up of blood.  No change in color of mucus.  No wheezing.  No chest wall deformity  Skin: no rash or lesions.  GU: no dysuria, change in color of urine, no urgency or frequency.  No flank pain.  MS:  No joint pain or swelling.  No decreased range of motion.  No back pain.  Psych:  No change in mood or affect. No depression or anxiety.  No memory loss.  Observations/Objective: There are no observations as this was a telephone visit  Assessment and Plan: #1 internal hemorrhoids with continued intermittent bleeding but stable hemoglobin  Will continue the Anusol treatments as prescribed  #2 increasing right lower quadrant abdominal pain with abnormalities on previous abdominal CT scan.  I spoke to the patient's gastroenterologist and he recommends repeating abdominal CT scan with contrast at this time.  Plan will be to obtain abdominal pelvic CT scan  imaging today and further recommendations will depend upon this result  Follow Up Instructions: The patient understands that she will be getting an abdominal CT scan today   I discussed the assessment and treatment plan with the patient. The patient was provided an opportunity to ask questions and all were answered. The patient agreed with the plan and demonstrated an understanding of the instructions.   The patient was advised to call back or seek an in-person evaluation if the  symptoms worsen or if the condition fails to improve as anticipated.  I provided 22 minutes of non-face-to-face time during this encounter.   Asencion Noble, MD

## 2018-09-13 ENCOUNTER — Other Ambulatory Visit: Payer: Self-pay

## 2018-09-13 ENCOUNTER — Encounter: Payer: Self-pay | Admitting: Critical Care Medicine

## 2018-09-13 ENCOUNTER — Ambulatory Visit: Payer: Self-pay | Attending: Critical Care Medicine | Admitting: Critical Care Medicine

## 2018-09-13 DIAGNOSIS — R1032 Left lower quadrant pain: Secondary | ICD-10-CM

## 2018-09-13 DIAGNOSIS — D500 Iron deficiency anemia secondary to blood loss (chronic): Secondary | ICD-10-CM

## 2018-09-13 DIAGNOSIS — R590 Localized enlarged lymph nodes: Secondary | ICD-10-CM

## 2018-09-13 DIAGNOSIS — K649 Unspecified hemorrhoids: Secondary | ICD-10-CM

## 2018-09-13 NOTE — Telephone Encounter (Signed)
Patient received only rectal cream for pain and itching.

## 2018-09-13 NOTE — Telephone Encounter (Signed)
I know, I just did a phone visit, CT Abd ordered.

## 2018-09-13 NOTE — Progress Notes (Signed)
Patient is still having hemorrhoid internal bleeding.

## 2018-09-14 ENCOUNTER — Telehealth: Payer: Self-pay | Admitting: Critical Care Medicine

## 2018-09-14 ENCOUNTER — Ambulatory Visit
Admission: RE | Admit: 2018-09-14 | Discharge: 2018-09-14 | Disposition: A | Discharge status: Home / Self Care | Payer: Self-pay | Source: Ambulatory Visit | Attending: Critical Care Medicine | Admitting: Critical Care Medicine

## 2018-09-14 DIAGNOSIS — K649 Unspecified hemorrhoids: Secondary | ICD-10-CM | POA: Diagnosis not present

## 2018-09-14 DIAGNOSIS — R590 Localized enlarged lymph nodes: Secondary | ICD-10-CM

## 2018-09-14 MED ORDER — IOPAMIDOL (ISOVUE-300) INJECTION 61%
100.0000 mL | Freq: Once | INTRAVENOUS | Status: AC | PRN
  Administered 2018-09-14: 100 mL via INTRAVENOUS

## 2018-09-14 NOTE — Telephone Encounter (Signed)
The patient is aware of the right inguinal lymphadenopathy and need for biopsy  We will order a radiologic biopsy of the right inguinal lymph node under ultrasound guidance

## 2018-09-22 ENCOUNTER — Other Ambulatory Visit: Payer: Self-pay | Admitting: Critical Care Medicine

## 2018-09-23 ENCOUNTER — Other Ambulatory Visit: Payer: Self-pay | Admitting: Critical Care Medicine

## 2018-09-23 DIAGNOSIS — R590 Localized enlarged lymph nodes: Secondary | ICD-10-CM

## 2018-09-28 ENCOUNTER — Other Ambulatory Visit: Payer: Self-pay | Admitting: Student

## 2018-09-28 ENCOUNTER — Other Ambulatory Visit: Payer: Self-pay | Admitting: Pediatrics

## 2018-09-29 ENCOUNTER — Encounter (HOSPITAL_COMMUNITY): Payer: Self-pay

## 2018-09-29 ENCOUNTER — Other Ambulatory Visit: Payer: Self-pay

## 2018-09-29 ENCOUNTER — Ambulatory Visit (HOSPITAL_COMMUNITY)
Admission: RE | Admit: 2018-09-29 | Discharge: 2018-09-29 | Disposition: A | Discharge status: Home / Self Care | Payer: Medicaid Other | Source: Ambulatory Visit | Attending: Critical Care Medicine | Admitting: Critical Care Medicine

## 2018-09-29 DIAGNOSIS — R590 Localized enlarged lymph nodes: Secondary | ICD-10-CM | POA: Diagnosis not present

## 2018-09-29 LAB — PROTIME-INR
INR: 1 (ref 0.8–1.2)
Prothrombin Time: 12.8 seconds (ref 11.4–15.2)

## 2018-09-29 LAB — CBC
HCT: 28.2 % — ABNORMAL LOW (ref 36.0–46.0)
Hemoglobin: 8.6 g/dL — ABNORMAL LOW (ref 12.0–15.0)
MCH: 26.2 pg (ref 26.0–34.0)
MCHC: 30.5 g/dL (ref 30.0–36.0)
MCV: 86 fL (ref 80.0–100.0)
Platelets: 425 10*3/uL — ABNORMAL HIGH (ref 150–400)
RBC: 3.28 MIL/uL — ABNORMAL LOW (ref 3.87–5.11)
RDW: 17.1 % — ABNORMAL HIGH (ref 11.5–15.5)
WBC: 5.4 10*3/uL (ref 4.0–10.5)
nRBC: 0 % (ref 0.0–0.2)

## 2018-09-29 LAB — APTT: aPTT: 25 seconds (ref 24–36)

## 2018-09-29 MED ORDER — SODIUM CHLORIDE 0.9 % IV SOLN: INTRAVENOUS | Status: DC

## 2018-09-29 MED ORDER — FENTANYL CITRATE (PF) 100 MCG/2ML IJ SOLN
INTRAMUSCULAR | Status: AC | PRN
  Administered 2018-09-29 (×3): 25 ug via INTRAVENOUS

## 2018-09-29 MED ORDER — MIDAZOLAM HCL 2 MG/2ML IJ SOLN
INTRAMUSCULAR | Status: AC
  Filled 2018-09-29: qty 2

## 2018-09-29 MED ORDER — LIDOCAINE HCL (PF) 1 % IJ SOLN
INTRAMUSCULAR | Status: AC
  Filled 2018-09-29: qty 30

## 2018-09-29 MED ORDER — FENTANYL CITRATE (PF) 100 MCG/2ML IJ SOLN
INTRAMUSCULAR | Status: AC
  Filled 2018-09-29: qty 2

## 2018-09-29 MED ORDER — MIDAZOLAM HCL 2 MG/2ML IJ SOLN
INTRAMUSCULAR | Status: AC | PRN
  Administered 2018-09-29: 0.5 mg via INTRAVENOUS
  Administered 2018-09-29: 1 mg via INTRAVENOUS

## 2018-09-29 NOTE — Procedures (Signed)
Interventional Radiology Procedure:   Indications: Lymphadenopathy  Procedure: US guided core biopsy  Findings: Core biopsies from right inguinal lymph node  Complications: None     EBL: less than 10 ml  Plan: Bedrest 1 hour.    Jerritt Cardoza R. Anselm Pancoast, MD  Pager: [phone]

## 2018-09-29 NOTE — Discharge Instructions (Addendum)
Needle Biopsy, Care After °This sheet gives you information about how to care for yourself after your procedure. Your health care provider may also give you more specific instructions. If you have problems or questions, contact your health care provider. °What can I expect after the procedure? °After the procedure, it is common to have soreness, bruising, or mild pain at the puncture site. This should go away in a few days. °Follow these instructions at home: °Needle insertion site care ° °· Wash your hands with soap and water before you change your bandage (dressing). If you cannot use soap and water, use hand sanitizer. °· Follow instructions from your health care provider about how to take care of your puncture site. This includes: °? When and how to change your dressing. °? When to remove your dressing. °· Check your puncture site every day for signs of infection. Check for: °? Redness, swelling, or pain. °? Fluid or blood. °? Pus or a bad smell. °? Warmth. °General instructions °· Return to your normal activities as told by your health care provider. Ask your health care provider what activities are safe for you. °· Do not take baths, swim, or use a hot tub until your health care provider approves. Ask your health care provider if you may take showers. You may only be allowed to take sponge baths. °· Take over-the-counter and prescription medicines only as told by your health care provider. °· Keep all follow-up visits as told by your health care provider. This is important. °Contact a health care provider if: °· You have a fever. °· You have redness, swelling, or pain at the puncture site that lasts longer than a few days. °· You have fluid, blood, or pus coming from your puncture site. °· Your puncture site feels warm to the touch. °Get help right away if: °· You have severe bleeding from the puncture site. °Summary °· After the procedure, it is common to have soreness, bruising, or mild pain at the puncture  site. This should go away in a few days. °· Check your puncture site every day for signs of infection, such as redness, swelling, or pain. °· Get help right away if you have severe bleeding from your puncture site. °This information is not intended to replace advice given to you by your health care provider. Make sure you discuss any questions you have with your health care provider. °Document Released: 10/03/2014 Document Revised: 06/01/2017 Document Reviewed: 06/01/2017 °Elsevier Interactive Patient Education © 2019 [employer] °Moderate Conscious Sedation, Adult, Care After °These instructions provide you with information about caring for yourself after your procedure. Your health care provider may also give you more specific instructions. Your treatment has been planned according to current medical practices, but problems sometimes occur. Call your health care provider if you have any problems or questions after your procedure. °What can I expect after the procedure? °After your procedure, it is common: °· To feel sleepy for several hours. °· To feel clumsy and have poor balance for several hours. °· To have poor judgment for several hours. °· To vomit if you eat too soon. °Follow these instructions at home: °For at least 24 hours after the procedure: ° °· Do not: °? Participate in activities where you could fall or become injured. °? Drive. °? Use heavy machinery. °? Drink alcohol. °? Take sleeping pills or medicines that cause drowsiness. °? Make important decisions or sign legal documents. °? Take care of children on your own. °· Rest. °Eating   and drinking °· Follow the diet recommended by your health care provider. °· If you vomit: °? Drink water, juice, or soup when you can drink without vomiting. °? Make sure you have little or no nausea before eating solid foods. °General instructions °· Have a responsible adult stay with you until you are awake and alert. °· Take over-the-counter and prescription  medicines only as told by your health care provider. °· If you smoke, do not smoke without supervision. °· Keep all follow-up visits as told by your health care provider. This is important. °Contact a health care provider if: °· You keep feeling nauseous or you keep vomiting. °· You feel light-headed. °· You develop a rash. °· You have a fever. °Get help right away if: °· You have trouble breathing. °This information is not intended to replace advice given to you by your health care provider. Make sure you discuss any questions you have with your health care provider. °Document Released: 03/09/2013 Document Revised: 10/22/2015 Document Reviewed: 09/08/2015 °Elsevier Interactive Patient Education © 2019 [employer] Inc[employer] ° °

## 2018-09-29 NOTE — H&P (Signed)
Chief Complaint: Patient was seen in consultation today for right inguinal lymphadenopathy/biopsy.  Referring Physician(s): Wright,Patrick E  Supervising Physician: Markus Daft  Patient Status: [facility] - Out-pt  History of Present Illness: Leah Baker is a 38 y.o. female with a past medical history of lower GI bleed, left ACL and meniscus tear, and anemia. She was admitted to [facility] ED from 08/31/2018 to 09/02/2018 for management of lower GI bleed (secondary to internal hemorrhoids). During hospitalization, patient also underwent CT abdomen/pelvis which revealed right iliac and inguinal lymphadenopathy. After discharge, patient followed up with Dr. [name] who ordered additional images to evaluate lymph node and recommends biopsy for tissue diagnosis.  CT abdomen/pelvis 08/31/2018: 1. New right iliac and right inguinal lymphadenopathy of uncertain etiology. While a reactive/inflammatory etiology is possible, metastatic disease is also a concern. 2. Normal appendix. 3. 1.1 cm hypervascular lesion versus perfusion anomaly in the left hepatic lobe. Nonemergent abdominal MRI (without and with contrast) is recommended for further evaluation.  CT abdomen/pelvis 09/14/2018: 1. No acute CT findings of the abdomen or pelvis to explain left lower quadrant abdominal pain. No evidence of colonoscopy complication.  2. No change in enlarged right inguinal and iliac lymph nodes, the largest right iliac lymph node measuring 2.3 x 2.0 cm (series 2, image 84). These remain nonspecific. A right inguinal node would be amenable to ultrasound-guided biopsy desired. 3. Other chronic and incidental findings as detailed above.  IR requested by Dr. [name] for possible image-guided right inguinal lymph node biopsy. Patient awake and alert laying in bed with no complaints at this time. Denies fever, chills, chest pain, dyspnea, abdominal pain, or headache.   Past Medical History:  Diagnosis Date   ACL tear 08/2013   left knee   Anemia    takes iron supplement    Lower GI bleed 08/31/2018   Medial meniscus tear 08/2013   left knee   PONV (postoperative nausea and vomiting)    only happened once    Past Surgical History:  Procedure Laterality Date   BIOPSY  09/01/2018   Procedure: BIOPSY;  Surgeon: Otis Brace, MD;  Location: [place];  Service: Gastroenterology;;   CESAREAN SECTION  01/15/2002   CESAREAN SECTION  03/17/2011   Procedure: CESAREAN SECTION;  Surgeon: Osborne Oman, MD;  Location: [facility];  Service: Gynecology;  Laterality: N/A;   COLONOSCOPY WITH PROPOFOL N/A 09/01/2018   Procedure: COLONOSCOPY WITH PROPOFOL;  Surgeon: Otis Brace, MD;  Location: [place];  Service: Gastroenterology;  Laterality: N/A;   HYSTEROSCOPY W/D&C  06/21/2009   IUD REMOVAL  06/21/2009   MULTIPLE TOOTH EXTRACTIONS      Allergies: Patient has no known allergies.  Medications: Prior to Admission medications   Medication Sig Start Date End Date Taking? Authorizing Provider  ferrous sulfate 325 (65 FE) MG EC tablet Take 1 tablet (325 mg total) by mouth daily. 09/02/18  Yes Agyei, Caprice Kluver, MD  hydrocortisone (ANUSOL-HC) 25 MG suppository Place 1 suppository (25 mg total) rectally 2 (two) times daily. 09/07/18  Yes Elsie Stain, MD  Multiple Vitamins-Minerals (WOMENS MULTI VITAMIN & MINERAL PO) Take 1 tablet by mouth daily.   Yes [provider]  polyethylene glycol (MIRALAX / GLYCOLAX) packet Take 8.5 g by mouth daily. 1/2 packet daily    Yes [provider]  shark liver oil-cocoa butter (PREPARATION H) 0.25-88.44 % suppository Place 1 suppository rectally as needed for hemorrhoids.    [provider]     History reviewed. No pertinent  family history.  Social History   Socioeconomic History   Marital status: Single    Spouse name: Not on file   Number of children: Not on file   Years of education: Not on file   Highest education level: Not on file   Occupational History   Not on file  Social Needs   Financial resource strain: Not on file   Food insecurity:    Worry: Not on file    Inability: Not on file   Transportation needs:    Medical: Not on file    Non-medical: Not on file  Tobacco Use   Smoking status: Former Smoker    Last attempt to quit: 08/22/2008    Years since quitting: 10.1   Smokeless tobacco: Never Used  Substance and Sexual Activity   Alcohol use: Yes    Comment: occasionally   Drug use: No   Sexual activity: Yes  Lifestyle   Physical activity:    Days per week: Not on file    Minutes per session: Not on file   Stress: Not on file  Relationships   Social connections:    Talks on phone: Not on file    Gets together: Not on file    Attends religious service: Not on file    Active member of club or organization: Not on file    Attends meetings of clubs or organizations: Not on file    Relationship status: Not on file  Other Topics Concern   Not on file  Social History Narrative   Not on file     Review of Systems: A 12 point ROS discussed and pertinent positives are indicated in the HPI above.  All other systems are negative.  Review of Systems  Constitutional: Negative for chills and fever.  Respiratory: Negative for shortness of breath and wheezing.   Cardiovascular: Negative for chest pain and palpitations.  Gastrointestinal: Negative for abdominal pain.  Neurological: Negative for headaches.  Psychiatric/Behavioral: Negative for behavioral problems and confusion.    Vital Signs: BP 101/69    Pulse 95    Temp 98.1 F (36.7 C) (Oral)    Resp 18    Ht 5\' 3"  (1.6 m)    Wt 158 lb (71.7 kg)    LMP 09/19/2018    SpO2 100%    BMI 27.99 kg/m   Physical Exam Vitals signs and nursing note reviewed.  Constitutional:      General: She is not in acute distress.    Appearance: Normal appearance.  Cardiovascular:     Rate and Rhythm: Normal rate and regular rhythm.     Heart sounds:  Normal heart sounds. No murmur.  Pulmonary:     Effort: Pulmonary effort is normal. No respiratory distress.     Breath sounds: Normal breath sounds. No wheezing.  Skin:    General: Skin is warm and dry.  Neurological:     Mental Status: She is alert and oriented to person, place, and time.  Psychiatric:        Mood and Affect: Mood normal.        Behavior: Behavior normal.        Thought Content: Thought content normal.        Judgment: Judgment normal.      MD Evaluation Airway: WNL Heart: WNL Abdomen: WNL Chest/ Lungs: WNL ASA  Classification: 2 Mallampati/Airway Score: One   Imaging: Ct Abdomen Pelvis W Contrast  Result Date: 09/14/2018 CLINICAL DATA:  Hemorrhoids, left lower quadrant  abdominal pain, recent colonoscopy, lymphadenopathy EXAM: CT ABDOMEN AND PELVIS WITH CONTRAST TECHNIQUE: Multidetector CT imaging of the abdomen and pelvis was performed using the standard protocol following bolus administration of intravenous contrast. CONTRAST:  174mL ISOVUE-300 IOPAMIDOL (ISOVUE-300) INJECTION 61% COMPARISON:  08/31/2018 FINDINGS: Lower chest: No acute abnormality. Hepatobiliary: Subtle hyperenhancing lesion of the anterior left lobe of the liver (series 2, image 17), unchanged from prior. No gallstones, gallbladder wall thickening, or biliary dilatation. Pancreas: Unremarkable. No pancreatic ductal dilatation or surrounding inflammatory changes. Spleen: Normal in size without focal abnormality. Adrenals/Urinary Tract: Adrenal glands are unremarkable. Kidneys are normal, without renal calculi, focal lesion, or hydronephrosis. Bladder is unremarkable. Probable urethral diverticulum (series 2, image 81). Stomach/Bowel: Stomach is within normal limits. Appendix appears normal. No evidence of bowel wall thickening, distention, or inflammatory changes. Vascular/Lymphatic: No significant vascular findings are present. No change in enlarged right inguinal and iliac lymph nodes, the largest  right iliac lymph node measuring 2.3 x 2.0 cm (series 2, image 84). Reproductive: No mass or other abnormality. Bilateral tubal ligation clips. Low-attenuation lesions of the bilateral ovaries, consistent with functional cysts or follicles. Other: No abdominal wall hernia or abnormality. No abdominopelvic ascites. Musculoskeletal: No acute or significant osseous findings. IMPRESSION: 1. No acute CT findings of the abdomen or pelvis to explain left lower quadrant abdominal pain. No evidence of colonoscopy complication. 2. No change in enlarged right inguinal and iliac lymph nodes, the largest right iliac lymph node measuring 2.3 x 2.0 cm (series 2, image 84). These remain nonspecific. A right inguinal node would be amenable to ultrasound-guided biopsy desired. 3.  Other chronic and incidental findings as detailed above. Electronically Signed   By: Eddie Candle [name]   On: 09/14/2018 09:34   Ct Abdomen Pelvis W Contrast  Result Date: 08/31/2018 CLINICAL DATA:  Right lower abdominal pain for 2 days. EXAM: CT ABDOMEN AND PELVIS WITH CONTRAST TECHNIQUE: Multidetector CT imaging of the abdomen and pelvis was performed using the standard protocol following bolus administration of intravenous contrast. CONTRAST:  148mL ISOVUE-300 IOPAMIDOL (ISOVUE-300) INJECTION 61% COMPARISON:  07/30/2009 FINDINGS: Lower chest: Dependent subsegmental atelectasis in the lung bases. No pleural effusion. Hepatobiliary: Partly rounded though somewhat poorly defined area of hyperenhancement peripherally in hepatic segment II measuring approximately 1.1 cm without an abnormality evident in this location on the prior unenhanced CT. Unremarkable gallbladder. No biliary dilatation. Pancreas: Unremarkable. Spleen: Unremarkable. Adrenals/Urinary Tract: Unremarkable adrenal glands. No evidence of renal mass, calculi, or hydronephrosis. Unremarkable bladder. Stomach/Bowel: The stomach is unremarkable. No bowel dilatation or gross bowel wall  thickening is identified. Scattered sigmoid colon diverticula are noted without evidence of diverticulitis. The appendix is unremarkable. Vascular/Lymphatic: No significant vascular findings are present. A borderline enlarged right common iliac lymph node measures 10 mm in short axis. Mildly enlarged right external iliac nodes measure 11-12 mm in short axis while right inguinal nodes measure up to 17 mm. Reproductive: Unremarkable uterus. Tubal ligation. Slightly prominent size of both ovaries without a focal abnormality identified by CT. Other: No ascites. Small fat containing umbilical hernia. Musculoskeletal: No suspicious osseous lesion. IMPRESSION: 1. New right iliac and right inguinal lymphadenopathy of uncertain etiology. While a reactive/inflammatory etiology is possible, metastatic disease is also a concern. 2. Normal appendix. 3. 1.1 cm hypervascular lesion versus perfusion anomaly in the left hepatic lobe. Nonemergent abdominal MRI (without and with contrast) is recommended for further evaluation. Electronically Signed   By: Logan Bores [name]   On: 08/31/2018 10:43    Labs:  CBC: Recent Labs    09/01/18 0421 09/02/18 0255 09/07/18 0924 09/29/18 1130  WBC 6.5 8.9 9.4 5.4  HGB 8.0* 8.6* 8.9* 8.6*  HCT 25.0* 26.9* 29.8* 28.2*  PLT 360 383 464* 425*    COAGS: Recent Labs    08/31/18 0828 09/29/18 1130  INR 0.9 1.0  APTT  --  25    BMP: Recent Labs    08/31/18 0828 09/01/18 0421 09/02/18 0255 09/07/18 0924  NA 138 138 136 140  K 3.3* 3.2* 4.1 4.4  CL 107 108 105 102  CO2 21* 22 26 24   GLUCOSE 105* 100* 109* 87  BUN 10 5* 7 8  CALCIUM 9.0 8.4* 9.0 9.7  CREATININE 0.85 0.79 0.87 0.83  GFRNONAA >60 >60 >60 89  GFRAA >60 >60 >60 103    LIVER FUNCTION TESTS: Recent Labs    08/31/18 0828  BILITOT 0.6  AST 21  ALT 16  ALKPHOS 42  PROT 6.9  ALBUMIN 3.8     Assessment and Plan:  Right inguinal lymphadenopathy. Plan for image-guided right inguinal lymph node  biopsy today with Dr. Anselm Pancoast. Patient is NPO. Afebrile and WBCs WNL. She does not take blood thinners. INR 1.0 seconds today.  Risks and benefits discussed with the patient including, but not limited to bleeding, infection, damage to adjacent structures or low yield requiring additional tests. All of the patient's questions were answered, patient is agreeable to proceed. Consent signed and in chart.   Thank you for this interesting consult.  I greatly enjoyed meeting SHERRE WOOTON and look forward to participating in their care.  A copy of this report was sent to the requesting provider on this date.  Electronically Signed: Earley Abide, PA-C 09/29/2018, 1:02 PM   I spent a total of 30 Minutes in face to face in clinical consultation, greater than 50% of which was counseling/coordinating care for right inguinal lymphadenopathy/biopsy.

## 2018-09-29 NOTE — Progress Notes (Signed)
Patient D/C home. VSS and in no distress. Discharge information reviewed and given to patient. Patient verbalizes understanding.

## 2018-10-01 ENCOUNTER — Telehealth: Payer: Self-pay

## 2018-10-01 MED ORDER — TRAMADOL HCL 50 MG PO TABS: 50.0000 mg | ORAL_TABLET | Freq: Four times a day (QID) | ORAL | 0 refills | Status: DC | PRN

## 2018-10-01 NOTE — Telephone Encounter (Signed)
New Message   Pt states that she is having pain at her biopsy site and she tried to reach out to the place that di the biopsy but they told her to contact her primary. Please advise

## 2018-10-01 NOTE — Telephone Encounter (Signed)
Called pharmacy [phone], [facility]. Unable to reach Pharmacist or PharmTech.  No one answered the prescriber line.  Tramadol authorization was left on the voicemail with patient name, [name], Sig, Presciber's NPI,  Writer callback direct extension.   Patient aware that Rx was called to pharmacy. Advised against driving after taking medication. Pt verbalized understanding.

## 2018-10-01 NOTE — Telephone Encounter (Signed)
Leah Baker states that the pain in her groin was bad prior to the biopsy on Wednesday, but has worsened since. She states nothing she has tried relieves this pain: Tylenol, warm compresses, elevation of extremities.   Please advise.

## 2018-10-04 ENCOUNTER — Telehealth: Payer: Self-pay | Admitting: Critical Care Medicine

## 2018-10-04 DIAGNOSIS — K649 Unspecified hemorrhoids: Secondary | ICD-10-CM

## 2018-10-04 MED ORDER — HYDROCORTISONE ACETATE 25 MG RE SUPP: 25.0000 mg | Freq: Two times a day (BID) | RECTAL | 0 refills | Status: DC

## 2018-10-04 NOTE — Telephone Encounter (Signed)
I spoke to the patient about her results.   Path neg for malignancy. I would like her to see GI MD.  She is medicaid pending.     I will put order in for Dr Rosalie Gums to see the patient. He saw her in the hospital for her GI bleeding.  I refilled her Anusol [facility]

## 2018-10-07 DIAGNOSIS — K625 Hemorrhage of anus and rectum: Secondary | ICD-10-CM | POA: Diagnosis not present

## 2018-10-07 DIAGNOSIS — R590 Localized enlarged lymph nodes: Secondary | ICD-10-CM | POA: Diagnosis not present

## 2018-10-07 DIAGNOSIS — K769 Liver disease, unspecified: Secondary | ICD-10-CM | POA: Diagnosis not present

## 2018-10-14 ENCOUNTER — Ambulatory Visit: Payer: Medicaid Other | Attending: Family Medicine | Admitting: Physician Assistant

## 2018-10-14 ENCOUNTER — Ambulatory Visit: Payer: Medicaid Other | Attending: Family Medicine

## 2018-10-14 ENCOUNTER — Other Ambulatory Visit: Payer: Self-pay

## 2018-10-14 DIAGNOSIS — R590 Localized enlarged lymph nodes: Secondary | ICD-10-CM

## 2018-10-14 DIAGNOSIS — D649 Anemia, unspecified: Secondary | ICD-10-CM

## 2018-10-14 MED ORDER — TRAMADOL HCL 50 MG PO TABS: 50.0000 mg | ORAL_TABLET | Freq: Four times a day (QID) | ORAL | 0 refills | Status: DC | PRN

## 2018-10-14 MED ORDER — FERROUS SULFATE 325 (65 FE) MG PO TBEC: 325.0000 mg | DELAYED_RELEASE_TABLET | Freq: Two times a day (BID) | ORAL | 3 refills | Status: DC

## 2018-10-14 MED FILL — traMADol HCL 50 MG TABS: 50 | 7 days supply | Qty: 28 | Fill #0

## 2018-10-14 NOTE — Progress Notes (Signed)
Patient ID: Leah Baker, female   [name]: 09-May-1981, 38 y.o.   MRN: [id number] Virtual Visit via Video Note  I connected with [name] on 10/14/18 at  8:30 AM EDT by a video enabled telemedicine application and verified that I am speaking with the correct person using two identifiers.   I discussed the limitations of evaluation and management by telemedicine and the availability of in person appointments. The patient expressed understanding and agreed to proceed.  Patient location:  home My Location:  [place] office Persons on the call:  myself and the patient    History of Present Illness:  Patient continuing to have inguinal LN and pain related to recent biopsy.  She is having bright red blood per rectum esp at the time of her period(no bleeding gums, epistaxis, etc).  Not large amounts of blood.  LMP 09/12/2018.  Tramadol helps with pain-she is taking 1.5 tabs bid.  Taking iron once daily and taking miralax once daily.  +recent colonoscopy that identified large hemorrhoids.  She has an appt with a surgeon on 10/29/2018 for this.  Last Hgb 8.6, platelets 425 on 09/29/2018.  (notes reviewed-see previous notes/labs)    Observations/Objective:  A&Ox3.  Lucid.  Upbeat affect.  TP linear.  Speech is normal.     Assessment and Plan: 1. Anemia Increase dose of iron to bid.  Drink 80-100 ounces water daily to avoid constipation and aid in hydration overall. Increase dose of miralax to bid. - ferrous sulfate 325 (65 FE) MG EC tablet; Take 1 tablet (325 mg total) by mouth 2 (two) times a day.  Dispense: 90 tablet; Refill: 3 - CBC with Differential/Platelet  2. Inguinal lymphadenopathy -Drink 80-100 ounces water daily to avoid constipation and aid in hydration overall. Increase dose of miralax to bid. - traMADol (ULTRAM) 50 MG tablet; Take 1 tablet (50 mg total) by mouth every 6 (six) hours as needed.  Dispense: 40 tablet; Refill: 0   Follow Up Instructions: -see Dr [name] in ~6 weeks    I  discussed the assessment and treatment plan with the patient. The patient was provided an opportunity to ask questions and all were answered. The patient agreed with the plan and demonstrated an understanding of the instructions.   The patient was advised to call back or seek an in-person evaluation if the symptoms worsen or if the condition fails to improve as anticipated.  I provided 16 minutes of non-face-to-face time during this encounter.   Freeman Caldron, PA-C

## 2018-10-15 LAB — CBC WITH DIFFERENTIAL/PLATELET
Basophils Absolute: 0.1 10*3/uL (ref 0.0–0.2)
Basos: 1 %
EOS (ABSOLUTE): 0.1 10*3/uL (ref 0.0–0.4)
Eos: 2 %
Hematocrit: 27.7 % — ABNORMAL LOW (ref 34.0–46.6)
Hemoglobin: 9 g/dL — ABNORMAL LOW (ref 11.1–15.9)
Immature Grans (Abs): 0 10*3/uL (ref 0.0–0.1)
Immature Granulocytes: 0 %
Lymphocytes Absolute: 2.4 10*3/uL (ref 0.7–3.1)
Lymphs: 35 %
MCH: 26.8 pg (ref 26.6–33.0)
MCHC: 32.5 g/dL (ref 31.5–35.7)
MCV: 82 fL (ref 79–97)
Monocytes Absolute: 0.7 10*3/uL (ref 0.1–0.9)
Monocytes: 10 %
Neutrophils Absolute: 3.6 10*3/uL (ref 1.4–7.0)
Neutrophils: 52 %
Platelets: 482 10*3/uL — ABNORMAL HIGH (ref 150–450)
RBC: 3.36 x10E6/uL — ABNORMAL LOW (ref 3.77–5.28)
RDW: 15.4 % (ref 11.7–15.4)
WBC: 6.9 10*3/uL (ref 3.4–10.8)

## 2018-10-21 MED FILL — traMADol HCL 50 MG TABS: 50 | 3 days supply | Qty: 12 | Fill #1

## 2018-10-29 DIAGNOSIS — K625 Hemorrhage of anus and rectum: Secondary | ICD-10-CM | POA: Diagnosis not present

## 2018-10-29 DIAGNOSIS — K649 Unspecified hemorrhoids: Secondary | ICD-10-CM | POA: Diagnosis not present

## 2018-11-03 ENCOUNTER — Ambulatory Visit (HOSPITAL_COMMUNITY): Payer: Self-pay

## 2018-11-08 ENCOUNTER — Emergency Department (HOSPITAL_COMMUNITY): Payer: Medicaid Other

## 2018-11-08 ENCOUNTER — Other Ambulatory Visit: Payer: Self-pay

## 2018-11-08 ENCOUNTER — Emergency Department (HOSPITAL_COMMUNITY)
Admission: EM | Admit: 2018-11-08 | Discharge: 2018-11-08 | Disposition: A | Discharge status: Home / Self Care | Payer: Medicaid Other | Attending: Emergency Medicine | Admitting: Emergency Medicine

## 2018-11-08 ENCOUNTER — Telehealth: Payer: Self-pay

## 2018-11-08 ENCOUNTER — Encounter (HOSPITAL_COMMUNITY): Payer: Self-pay | Admitting: Radiology

## 2018-11-08 DIAGNOSIS — Z79899 Other long term (current) drug therapy: Secondary | ICD-10-CM | POA: Insufficient documentation

## 2018-11-08 DIAGNOSIS — D649 Anemia, unspecified: Secondary | ICD-10-CM | POA: Insufficient documentation

## 2018-11-08 DIAGNOSIS — R0789 Other chest pain: Secondary | ICD-10-CM | POA: Diagnosis not present

## 2018-11-08 DIAGNOSIS — R079 Chest pain, unspecified: Secondary | ICD-10-CM | POA: Diagnosis not present

## 2018-11-08 DIAGNOSIS — Z87891 Personal history of nicotine dependence: Secondary | ICD-10-CM | POA: Insufficient documentation

## 2018-11-08 DIAGNOSIS — M940 Chondrocostal junction syndrome [Tietze]: Secondary | ICD-10-CM

## 2018-11-08 HISTORY — DX: Chondrocostal junction syndrome (tietze): M94.0

## 2018-11-08 LAB — TROPONIN I
Troponin I: <0.03 ng/mL (ref <0.03)
Troponin I: <0.03 ng/mL (ref <0.03)

## 2018-11-08 LAB — LIPASE, BLOOD: Lipase: 33 U/L (ref 11–51)

## 2018-11-08 LAB — HEPATIC FUNCTION PANEL
ALT: 17 U/L (ref 0–44)
AST: 20 U/L (ref 15–41)
Albumin: 3.8 g/dL (ref 3.5–5.0)
Alkaline Phosphatase: 46 U/L (ref 38–126)
Bilirubin, Direct: <0.1 mg/dL (ref 0.0–0.2)
Total Bilirubin: 0.3 mg/dL (ref 0.3–1.2)
Total Protein: 7 g/dL (ref 6.5–8.1)

## 2018-11-08 LAB — BASIC METABOLIC PANEL
Anion gap: 9 (ref 5–15)
BUN: 10 mg/dL (ref 6–20)
CO2: 21 mmol/L — ABNORMAL LOW (ref 22–32)
Calcium: 9.2 mg/dL (ref 8.9–10.3)
Chloride: 109 mmol/L (ref 98–111)
Creatinine, Ser: 0.83 mg/dL (ref 0.44–1.00)
GFR calc Af Amer: >60 mL/min (ref >60)
GFR calc non Af Amer: >60 mL/min (ref >60)
Glucose, Bld: 100 mg/dL — ABNORMAL HIGH (ref 70–99)
Potassium: 4.1 mmol/L (ref 3.5–5.1)
Sodium: 139 mmol/L (ref 135–145)

## 2018-11-08 LAB — CBC
HCT: 29.2 % — ABNORMAL LOW (ref 36.0–46.0)
Hemoglobin: 8.8 g/dL — ABNORMAL LOW (ref 12.0–15.0)
MCH: 24.4 pg — ABNORMAL LOW (ref 26.0–34.0)
MCHC: 30.1 g/dL (ref 30.0–36.0)
MCV: 81.1 fL (ref 80.0–100.0)
Platelets: 409 10*3/uL — ABNORMAL HIGH (ref 150–400)
RBC: 3.6 MIL/uL — ABNORMAL LOW (ref 3.87–5.11)
RDW: 15.9 % — ABNORMAL HIGH (ref 11.5–15.5)
WBC: 6.8 10*3/uL (ref 4.0–10.5)
nRBC: 0 % (ref 0.0–0.2)

## 2018-11-08 LAB — TYPE AND SCREEN
ABO/RH(D): O POS
Antibody Screen: NEGATIVE

## 2018-11-08 MED ORDER — IOHEXOL 350 MG/ML SOLN
80.0000 mL | Freq: Once | INTRAVENOUS | Status: AC | PRN
  Administered 2018-11-08: 80 mL via INTRAVENOUS

## 2018-11-08 MED ORDER — MORPHINE SULFATE (PF) 2 MG/ML IV SOLN
2.0000 mg | Freq: Once | INTRAVENOUS | Status: AC
  Administered 2018-11-08: 2 mg via INTRAVENOUS
  Filled 2018-11-08: qty 1

## 2018-11-08 MED ORDER — SODIUM CHLORIDE 0.9% FLUSH
3.0000 mL | Freq: Once | INTRAVENOUS | Status: AC
  Administered 2018-11-08: 08:00:00 3 mL via INTRAVENOUS

## 2018-11-08 NOTE — ED Notes (Signed)
Pt verbalized understanding of discharge paperwork and follow-up care.  °

## 2018-11-08 NOTE — Telephone Encounter (Signed)
Patients call taken.  Patient identified by name and date of birth.  Patient complaint of chest pain.  Patient seen in the ED and worked up for chest pain.  All labs, imaging and assessment were normal but patient discharged still with chest pain.  Patient told to call PCP.  Patient denies shortness of breath, or fever.  Pain is located left side.  Pain is 8/10.  Patient taking OTC without relief.  Pain is intermittent and is aggravated by movement.    Patient advised if chest pains become constant, with shortness of breath to go return to the ED or urgent care.   Patient made appointment to see Dr. [name] this week.

## 2018-11-08 NOTE — Telephone Encounter (Signed)
Nurse called the patient's home phone number but received no answer and message was left on the voicemail for the patient to call back.  Return phone number given. 

## 2018-11-08 NOTE — Discharge Instructions (Addendum)
No definite cause of your chest pain was found today You had normal heart tracings and heart enzymes. You had a normal CT angiogram that shows no evidence of blood clots in your lungs or dissection of your large vessels Your anemia appears stable from what it has been Please use Tylenol for pain Return if you are having worsening symptoms especially new symptoms such as shortness of breath or high fever Please follow-up with your doctor this week

## 2018-11-08 NOTE — ED Provider Notes (Signed)
[facility] EMERGENCY DEPARTMENT Provider Note   CSN: [id number] Arrival date & time: 11/08/18  0631    History   Chief Complaint Chief Complaint  Patient presents with  . Chest Pain    HPI Leah Baker is a 38 y.o. female.  HPI: A 38 year old patient presents for evaluation of chest pain. Initial onset of pain was approximately 1-3 hours ago. The patient's chest pain is described as heaviness/pressure/tightness and is not worse with exertion. The patient's chest pain is middle- or left-sided, is not well-localized, is not sharp and does not radiate to the arms/jaw/neck. The patient does not complain of nausea and denies diaphoresis. The patient has no history of stroke, has no history of peripheral artery disease, has not smoked in the past 90 days, denies any history of treated diabetes, has no relevant family history of coronary artery disease (first degree relative at less than age 73), is not hypertensive, has no history of hypercholesterolemia and does not have an elevated BMI (>=30).  Patient with recent history of rectal bleeding and worsening anemia.  Seen by GI and referred to general surgery for hemorroidectomy next week.  Patient noted episode of rectal bleeding this am increase from prior. No fever, chills, cough, nasal congetion or other symptoms or exposures to covid 19 HPI  Past Medical History:  Diagnosis Date  . ACL tear 08/2013   left knee  . Anemia    takes iron supplement   . Lower GI bleed 08/31/2018  . Medial meniscus tear 08/2013   left knee  . PONV (postoperative nausea and vomiting)    only happened once    Patient Active Problem List   Diagnosis Date Noted  . Liver lesion 09/09/2018  . Hemorrhoid 11/29/2014  . Iron deficiency anemia 05/04/2014  . Mood disorder ([place]) 02/20/2014  . Healthcare maintenance 01/02/2014  . Tears of meniscus and ACL of left knee 09/08/2013  . Depression 05/14/2011  . H/O: cesarean section 08/28/2010     Past Surgical History:  Procedure Laterality Date  . BIOPSY  09/01/2018   Procedure: BIOPSY;  Surgeon: Otis Brace, MD;  Location: [facility];  Service: Gastroenterology;;  . CESAREAN SECTION  01/15/2002  . CESAREAN SECTION  03/17/2011   Procedure: CESAREAN SECTION;  Surgeon: Osborne Oman, MD;  Location: [facility];  Service: Gynecology;  Laterality: N/A;  . COLONOSCOPY WITH PROPOFOL N/A 09/01/2018   Procedure: COLONOSCOPY WITH PROPOFOL;  Surgeon: Otis Brace, MD;  Location: [facility];  Service: Gastroenterology;  Laterality: N/A;  . HYSTEROSCOPY W/D&C  06/21/2009  . IUD REMOVAL  06/21/2009  . MULTIPLE TOOTH EXTRACTIONS       OB History    Gravida  4   Para  2   Term  2   Preterm  0   AB  2   Living  2     SAB  0   TAB  2   Ectopic  0   Multiple  0   Live Births  2            Home Medications    Prior to Admission medications   Medication Sig Start Date End Date Taking? Authorizing Provider  ferrous sulfate 325 (65 FE) MG EC tablet Take 1 tablet (325 mg total) by mouth 2 (two) times a day. 10/14/18   Argentina Donovan, PA-C  hydrocortisone (ANUSOL-HC) 25 MG suppository Place 1 suppository (25 mg total) rectally 2 (two) times daily. 10/04/18   Elsie Stain,  MD  Multiple Vitamins-Minerals (WOMENS MULTI VITAMIN & MINERAL PO) Take 1 tablet by mouth daily.    [provider]  polyethylene glycol (MIRALAX / GLYCOLAX) packet Take 8.5 g by mouth daily. 1/2 packet daily     [provider]  shark liver oil-cocoa butter (PREPARATION H) 0.25-88.44 % suppository Place 1 suppository rectally as needed for hemorrhoids.    [provider]  traMADol (ULTRAM) 50 MG tablet Take 1 tablet (50 mg total) by mouth every 6 (six) hours as needed. 10/14/18   Argentina Donovan, PA-C    Family History No family history on file.  Social History Social History   Tobacco Use  . Smoking status: Former Smoker    Last attempt to quit: 08/22/2008     Years since quitting: 10.2  . Smokeless tobacco: Never Used  Substance Use Topics  . Alcohol use: Yes    Comment: occasionally  . Drug use: No     Allergies   Patient has no known allergies.   Review of Systems Review of Systems  All other systems reviewed and are negative.    Physical Exam Updated Vital Signs BP 121/73 (BP Location: Right Arm)   Pulse 82   Temp 98.5 F (36.9 C) (Oral)   Resp 16   Ht 1.575 m (5\' 2" )   Wt 70.3 kg   LMP 10/22/2018   SpO2 100%   BMI 28.35 kg/m   Physical Exam Vitals signs and nursing note reviewed.  Constitutional:      General: She is not in acute distress.    Appearance: She is well-developed. She is not ill-appearing.  HENT:     Head: Normocephalic.  Eyes:     Extraocular Movements: Extraocular movements intact.     Pupils: Pupils are equal, round, and reactive to light.  Neck:     Musculoskeletal: Normal range of motion.  Cardiovascular:     Rate and Rhythm: Normal rate and regular rhythm.     Heart sounds: Normal heart sounds.  Pulmonary:     Effort: Pulmonary effort is normal.     Breath sounds: Normal breath sounds.  Abdominal:     General: Bowel sounds are normal.     Palpations: Abdomen is soft.  Musculoskeletal: Normal range of motion.  Skin:    General: Skin is warm.     Capillary Refill: Capillary refill takes less than 2 seconds.  Neurological:     General: No focal deficit present.     Mental Status: She is alert.      ED Treatments / Results  Labs (all labs ordered are listed, but only abnormal results are displayed) Labs Reviewed  CBC - Abnormal; Notable for the following components:      Result Value   RBC 3.60 (*)    Hemoglobin 8.8 (*)    HCT 29.2 (*)    MCH 24.4 (*)    RDW 15.9 (*)    Platelets 409 (*)    All other components within normal limits  BASIC METABOLIC PANEL  TROPONIN I  HEPATIC FUNCTION PANEL  LIPASE, BLOOD  I-STAT BETA HCG BLOOD, ED ([facility], [facility], [facility] ONLY)  TYPE AND SCREEN     EKG EKG Interpretation  Date/Time:  Monday November 08 2018 06:41:39 EDT Ventricular Rate:  74 PR Interval:  128 QRS Duration: 72 QT Interval:  402 QTC Calculation: 446 R Axis:   74 Text Interpretation:  Normal sinus rhythm Normal ECG Confirmed by Pattricia Boss [phone]) on 11/08/2018 7:04:45  AM   Radiology Dg Chest 2 View  Result Date: 11/08/2018 CLINICAL DATA:  Chest pain. EXAM: CHEST - 2 VIEW COMPARISON:  Two-view chest x-Jaeli Grubb 07/08/2009 FINDINGS: The heart size and mediastinal contours are within normal limits. Both lungs are clear. The visualized skeletal structures are unremarkable. IMPRESSION: Normal two-view chest x-Jacquette Canales Electronically Signed   By: San Morelle [name]   On: 11/08/2018 07:35    Procedures Procedures (including critical care time)  Medications Ordered in ED Medications  sodium chloride flush (NS) 0.9 % injection 3 mL (has no administration in time range)  morphine 2 MG/ML injection 2 mg (has no administration in time range)     Initial Impression / Assessment and Plan / ED Course  I have reviewed the triage vital signs and the nursing notes.  Pertinent labs & imaging results that were available during my care of the patient were reviewed by me and considered in my medical decision making (see chart for details).     HEAR Score: 1  10:03 AM Patient some improved Plan repeat trop And ct angio Will d/c if negative Anemia- close to baseline, Patient scheduled to have surgery for hemorrhoids    Final Clinical Impressions(s) / ED Diagnoses   Final diagnoses:  Chest pain, unspecified type  Anemia, unspecified type    ED Discharge Orders    None       Pattricia Boss, MD 11/08/18 1159

## 2018-11-08 NOTE — ED Triage Notes (Signed)
Pt present with upper mid CP onset 0430 after BM, pt reports gross blood in stool but no more than normal, +dizziness, denies n/v.

## 2018-11-08 NOTE — Telephone Encounter (Signed)
Patient called stating she went to ED was seen for a massive bleed out and chest pain. Patient said sue was sent home still in pain and was unhappy with the service she received want to hear from out office on what to do

## 2018-11-10 ENCOUNTER — Ambulatory Visit: Payer: Medicaid Other | Attending: Critical Care Medicine | Admitting: Critical Care Medicine

## 2018-11-10 ENCOUNTER — Encounter: Payer: Self-pay | Admitting: Critical Care Medicine

## 2018-11-10 ENCOUNTER — Other Ambulatory Visit: Payer: Self-pay

## 2018-11-10 VITALS — BP 103/73 | HR 97 | Temp 98.7°F | Resp 18 | Ht 62.0 in | Wt 155.0 lb

## 2018-11-10 DIAGNOSIS — K648 Other hemorrhoids: Secondary | ICD-10-CM | POA: Diagnosis not present

## 2018-11-10 DIAGNOSIS — R1031 Right lower quadrant pain: Secondary | ICD-10-CM | POA: Insufficient documentation

## 2018-11-10 DIAGNOSIS — R590 Localized enlarged lymph nodes: Secondary | ICD-10-CM | POA: Diagnosis not present

## 2018-11-10 DIAGNOSIS — Z87891 Personal history of nicotine dependence: Secondary | ICD-10-CM | POA: Insufficient documentation

## 2018-11-10 DIAGNOSIS — K649 Unspecified hemorrhoids: Secondary | ICD-10-CM | POA: Diagnosis not present

## 2018-11-10 DIAGNOSIS — Z79899 Other long term (current) drug therapy: Secondary | ICD-10-CM | POA: Diagnosis not present

## 2018-11-10 DIAGNOSIS — M940 Chondrocostal junction syndrome [Tietze]: Secondary | ICD-10-CM | POA: Diagnosis not present

## 2018-11-10 HISTORY — DX: Chondrocostal junction syndrome (tietze): M94.0

## 2018-11-10 NOTE — Patient Instructions (Signed)
Try over-the-counter Voltaren gel on your chest wall to see if that will give some relief and follow some arm exercises as outlined below  Follow-up with your surgeon on the internal hemorrhoid and lymph node history  We will establish you with primary care in the next 3 months in our clinic

## 2018-11-10 NOTE — Assessment & Plan Note (Addendum)
Int hemorrhoids with Lower GI Bleeding Associated right inguinal lymphadenopathy  Plan is for further general surgery evaluation of internal hemorrhoids and potential biopsy of the right inguinal lymph node

## 2018-11-10 NOTE — Assessment & Plan Note (Signed)
Acute costochondritis with ant chest wall pain  Plan Prn voltaren gel

## 2018-11-10 NOTE — Progress Notes (Addendum)
Subjective:    Patient ID: Leah Baker, female    [name]: 1981/04/07, 38 y.o.   MRN: [id number]  This is a 38 year old female seen back in follow-up for lower GI bleeding, blood loss anemia, internal hemorrhoids, and iliac and right inguinal lymphadenopathy.  The patient has since followed up with gastroenterology and they referred the patient to general surgery is planning on doing an complete hemorrhoidal examination and treatment under anesthesia and potential inguinal lymph node biopsy  The patient continues to have right lower quadrant abdominal pain and intermittent lower abdominal GI bleeding.  The patient recently went to the emergency room for chest wall pain and had a complete cardiac evaluation with  negative CT scan of the chest for pulmonary embolism and negative cardiac evaluation.   Note the patient also has right inguinal lymphadenopathy and internal iliac lymphadenopathy.  We performed a needle biopsy of the inguinal node it was benign.  Apparently the surgeon may be doing an exploration and biopsy of this lymph node when the internal hemorrhoids are further evaluated.  This procedure is upcoming on June 18.    Past Medical History:  Diagnosis Date  . ACL tear 08/2013   left knee  . Anemia    takes iron supplement   . Lower GI bleed 08/31/2018  . Medial meniscus tear 08/2013   left knee  . PONV (postoperative nausea and vomiting)    only happened once     History reviewed. No pertinent family history.   Social History   Socioeconomic History  . Marital status: Single    Spouse name: Not on file  . Number of children: Not on file  . Years of education: Not on file  . Highest education level: Not on file  Occupational History  . Not on file  Social Needs  . Financial resource strain: Not on file  . Food insecurity    Worry: Not on file    Inability: Not on file  . Transportation needs    Medical: Not on file    Non-medical: Not on file  Tobacco Use  .  Smoking status: Former Smoker    Quit date: 08/22/2008    Years since quitting: 10.2  . Smokeless tobacco: Never Used  Substance and Sexual Activity  . Alcohol use: Yes    Comment: occasionally  . Drug use: No  . Sexual activity: Yes  Lifestyle  . Physical activity    Days per week: Not on file    Minutes per session: Not on file  . Stress: Not on file  Relationships  . Social Herbalist on phone: Not on file    Gets together: Not on file    Attends religious service: Not on file    Active member of club or organization: Not on file    Attends meetings of clubs or organizations: Not on file    Relationship status: Not on file  . Intimate partner violence    Fear of current or ex partner: Not on file    Emotionally abused: Not on file    Physically abused: Not on file    Forced sexual activity: Not on file  Other Topics Concern  . Not on file  Social History Narrative  . Not on file     No Known Allergies   Outpatient Medications Prior to Visit  Medication Sig Dispense Refill  . ferrous sulfate 325 (65 FE) MG EC tablet Take 1 tablet (325 mg total)  by mouth 2 (two) times a day. (Patient taking differently: Take 325 mg by mouth 4 (four) times daily. ) 90 tablet 3  . hydrocortisone (ANUSOL-HC) 25 MG suppository Place 1 suppository (25 mg total) rectally 2 (two) times daily. 24 suppository 0  . Multiple Vitamins-Minerals (WOMENS MULTI VITAMIN & MINERAL PO) Take 1 tablet by mouth daily.    . psyllium (HYDROCIL/METAMUCIL) 95 % PACK Take 1 packet by mouth daily.    . traMADol (ULTRAM) 50 MG tablet Take 1 tablet (50 mg total) by mouth every 6 (six) hours as needed. (Patient not taking: Reported on 11/08/2018) 40 tablet 0   No facility-administered medications prior to visit.      Constitutional:   No  weight loss, night sweats,  Fevers, chills, fatigue, lassitude. HEENT:   No headaches,  Difficulty swallowing,  Tooth/dental problems,  Sore throat,                No  sneezing, itching, ear ache, nasal congestion, post nasal drip,   CV:   chest pain,  Orthopnea, PND, swelling in lower extremities, anasarca, dizziness, palpitations  GI   heartburn, indigestion, abdominal pain, nausea, vomiting, diarrhea, change in bowel habits, loss of appetite,  BLood in stool  Resp: No shortness of breath with exertion or at rest.  No excess mucus, no productive cough,  No non-productive cough,  No coughing up of blood.  No change in color of mucus.  No wheezing.  No chest wall deformity  Skin: no rash or lesions.  GU: no dysuria, change in color of urine, no urgency or frequency.  No flank pain.  MS:  No joint pain or swelling.  No decreased range of motion.  No back pain.  Psych:  No change in mood or affect. No depression or anxiety.  No memory loss.     Objective:   Physical Exam Vitals:   11/10/18 0940  BP: 103/73  Pulse: 97  Resp: 18  Temp: 98.7 F (37.1 C)  TempSrc: Oral  SpO2: 100%  Weight: 155 lb (70.3 kg)  Height: 5\' 2"  (1.575 m)    Gen: Pleasant, well-nourished, in no distress,  normal affect  ENT: No lesions,  mouth clear,  oropharynx clear, no postnasal drip  Neck: No JVD, no TMG, no carotid bruits  Lungs: No use of accessory muscles, no dullness to percussion, clear without rales or rhonchi  Cardiovascular: RRR, heart sounds normal, no murmur or gallops, no peripheral edema Tender parasternal area c/w costochondritis  Abdomen: soft tender RLQ  R inguinal LAN  Musculoskeletal: No deformities, no cyanosis or clubbing  Neuro: alert, non focal  Skin: Warm, no lesions or rashes  No results found.    Assessment & Plan:  I personally reviewed all images and lab data in the [facility] system as well as any outside material available during this office visit and agree with the  radiology impressions.   Hemorrhoid Int hemorrhoids with Lower GI Bleeding Associated right inguinal lymphadenopathy  Plan is for further general surgery  evaluation of internal hemorrhoids and potential biopsy of the right inguinal lymph node   Costochondritis, acute Acute costochondritis with ant chest wall pain  Plan Prn voltaren gel    Briannia was seen today for follow-up.  Diagnoses and all orders for this visit:  Costochondritis, acute  Hemorrhoids, unspecified hemorrhoid type

## 2018-11-12 NOTE — Progress Notes (Signed)
Please enter pre-op orders in epic pt surgery on 11-18-2018.  Thank you !

## 2018-11-15 ENCOUNTER — Other Ambulatory Visit (HOSPITAL_COMMUNITY)
Admission: RE | Admit: 2018-11-15 | Discharge: 2018-11-15 | Disposition: A | Discharge status: Home / Self Care | Payer: Medicaid Other | Source: Ambulatory Visit | Attending: Surgery | Admitting: Surgery

## 2018-11-15 ENCOUNTER — Ambulatory Visit: Payer: Self-pay | Admitting: Surgery

## 2018-11-15 DIAGNOSIS — Z1159 Encounter for screening for other viral diseases: Secondary | ICD-10-CM | POA: Diagnosis not present

## 2018-11-16 ENCOUNTER — Other Ambulatory Visit: Payer: Self-pay

## 2018-11-16 ENCOUNTER — Encounter (HOSPITAL_BASED_OUTPATIENT_CLINIC_OR_DEPARTMENT_OTHER): Payer: Self-pay | Admitting: *Deleted

## 2018-11-16 LAB — NOVEL CORONAVIRUS, NAA (HOSP ORDER, SEND-OUT TO REF LAB; TAT 18-24 HRS): SARS-CoV-2, NAA: NOT DETECTED

## 2018-11-16 NOTE — Progress Notes (Signed)
Spoke w/ pt via phone for pre-op interview.  Npo after mn w/ exception clear liquids until 0600.  Arrive at 1000.  Needs CBCdiff, cmp, pt/ ptt.  Pt verbalized understanding to do one fleet enema am dos.  Pt had EKG done 11-08-2018, confirmed ekg in chart and epic (no repeat needed as dr white ordered)  Current cxr in chart and epic.  Pt had covid test done 11-15-2018.

## 2018-11-18 ENCOUNTER — Encounter (HOSPITAL_BASED_OUTPATIENT_CLINIC_OR_DEPARTMENT_OTHER)
Admission: RE | Disposition: A | Discharge status: Home / Self Care | Payer: Self-pay | Source: Home / Self Care | Attending: Surgery

## 2018-11-18 ENCOUNTER — Other Ambulatory Visit: Payer: Self-pay

## 2018-11-18 ENCOUNTER — Encounter (HOSPITAL_BASED_OUTPATIENT_CLINIC_OR_DEPARTMENT_OTHER): Payer: Self-pay

## 2018-11-18 ENCOUNTER — Ambulatory Visit (HOSPITAL_BASED_OUTPATIENT_CLINIC_OR_DEPARTMENT_OTHER): Payer: Medicaid Other | Admitting: Anesthesiology

## 2018-11-18 ENCOUNTER — Ambulatory Visit (HOSPITAL_BASED_OUTPATIENT_CLINIC_OR_DEPARTMENT_OTHER)
Admission: RE | Admit: 2018-11-18 | Discharge: 2018-11-18 | Disposition: A | Discharge status: Home / Self Care | Payer: Medicaid Other | Attending: Surgery | Admitting: Surgery

## 2018-11-18 DIAGNOSIS — K625 Hemorrhage of anus and rectum: Secondary | ICD-10-CM | POA: Diagnosis not present

## 2018-11-18 DIAGNOSIS — K648 Other hemorrhoids: Secondary | ICD-10-CM | POA: Diagnosis not present

## 2018-11-18 DIAGNOSIS — N920 Excessive and frequent menstruation with regular cycle: Secondary | ICD-10-CM | POA: Diagnosis not present

## 2018-11-18 DIAGNOSIS — K644 Residual hemorrhoidal skin tags: Secondary | ICD-10-CM | POA: Diagnosis not present

## 2018-11-18 DIAGNOSIS — D509 Iron deficiency anemia, unspecified: Secondary | ICD-10-CM | POA: Diagnosis not present

## 2018-11-18 DIAGNOSIS — K649 Unspecified hemorrhoids: Secondary | ICD-10-CM | POA: Diagnosis not present

## 2018-11-18 DIAGNOSIS — Z87891 Personal history of nicotine dependence: Secondary | ICD-10-CM | POA: Insufficient documentation

## 2018-11-18 DIAGNOSIS — F329 Major depressive disorder, single episode, unspecified: Secondary | ICD-10-CM | POA: Insufficient documentation

## 2018-11-18 DIAGNOSIS — K642 Third degree hemorrhoids: Secondary | ICD-10-CM | POA: Diagnosis not present

## 2018-11-18 DIAGNOSIS — R590 Localized enlarged lymph nodes: Secondary | ICD-10-CM | POA: Diagnosis not present

## 2018-11-18 HISTORY — DX: Localized enlarged lymph nodes: R59.0

## 2018-11-18 HISTORY — DX: Unspecified hemorrhoids: K64.9

## 2018-11-18 HISTORY — PX: RECTAL EXAM UNDER ANESTHESIA: SHX6399

## 2018-11-18 HISTORY — DX: Presence of spectacles and contact lenses: Z97.3

## 2018-11-18 HISTORY — DX: Iron deficiency anemia, unspecified: D50.9

## 2018-11-18 SURGERY — EXAM UNDER ANESTHESIA, RECTUM
Anesthesia: Monitor Anesthesia Care | Site: Rectum

## 2018-11-18 MED ORDER — LACTATED RINGERS IV SOLN
INTRAVENOUS | Status: DC
  Administered 2018-11-18: 50 mL/h via INTRAVENOUS
  Filled 2018-11-18: qty 1000

## 2018-11-18 MED ORDER — METOCLOPRAMIDE HCL 5 MG/ML IJ SOLN
10.0000 mg | Freq: Once | INTRAMUSCULAR | Status: DC | PRN
  Filled 2018-11-18: qty 2

## 2018-11-18 MED ORDER — MEPERIDINE HCL 25 MG/ML IJ SOLN
6.2500 mg | INTRAMUSCULAR | Status: DC | PRN
  Filled 2018-11-18: qty 1

## 2018-11-18 MED ORDER — CELECOXIB 200 MG PO CAPS
200.0000 mg | ORAL_CAPSULE | ORAL | Status: AC
  Administered 2018-11-18: 200 mg via ORAL
  Filled 2018-11-18: qty 1

## 2018-11-18 MED ORDER — LIDOCAINE 2% (20 MG/ML) 5 ML SYRINGE
INTRAMUSCULAR | Status: AC
  Filled 2018-11-18: qty 5

## 2018-11-18 MED ORDER — ONDANSETRON HCL 4 MG/2ML IJ SOLN
INTRAMUSCULAR | Status: AC
  Filled 2018-11-18: qty 2

## 2018-11-18 MED ORDER — LACTATED RINGERS IV SOLN
INTRAVENOUS | Status: DC
  Filled 2018-11-18: qty 1000

## 2018-11-18 MED ORDER — ACETAMINOPHEN 500 MG PO TABS
ORAL_TABLET | ORAL | Status: AC
  Filled 2018-11-18: qty 2

## 2018-11-18 MED ORDER — MIDAZOLAM HCL 2 MG/2ML IJ SOLN
INTRAMUSCULAR | Status: AC
  Filled 2018-11-18: qty 2

## 2018-11-18 MED ORDER — DEXAMETHASONE SODIUM PHOSPHATE 10 MG/ML IJ SOLN
INTRAMUSCULAR | Status: AC
  Filled 2018-11-18: qty 1

## 2018-11-18 MED ORDER — MIDAZOLAM HCL 5 MG/5ML IJ SOLN
INTRAMUSCULAR | Status: DC | PRN
  Administered 2018-11-18: 2 mg via INTRAVENOUS

## 2018-11-18 MED ORDER — FENTANYL CITRATE (PF) 100 MCG/2ML IJ SOLN
INTRAMUSCULAR | Status: DC | PRN
  Administered 2018-11-18: 50 ug via INTRAVENOUS

## 2018-11-18 MED ORDER — BUPIVACAINE LIPOSOME 1.3 % IJ SUSP
20.0000 mL | Freq: Once | INTRAMUSCULAR | Status: DC
  Filled 2018-11-18: qty 20

## 2018-11-18 MED ORDER — CHLORHEXIDINE GLUCONATE CLOTH 2 % EX PADS
6.0000 | MEDICATED_PAD | Freq: Once | CUTANEOUS | Status: DC
  Filled 2018-11-18: qty 6

## 2018-11-18 MED ORDER — FENTANYL CITRATE (PF) 100 MCG/2ML IJ SOLN
25.0000 ug | INTRAMUSCULAR | Status: DC | PRN
  Filled 2018-11-18: qty 1

## 2018-11-18 MED ORDER — LIDOCAINE HCL 1 % IJ SOLN
INTRAMUSCULAR | Status: DC | PRN
  Administered 2018-11-18: 40 mg via INTRADERMAL

## 2018-11-18 MED ORDER — ACETAMINOPHEN 500 MG PO TABS
1000.0000 mg | ORAL_TABLET | ORAL | Status: AC
  Administered 2018-11-18: 1000 mg via ORAL
  Filled 2018-11-18: qty 2

## 2018-11-18 MED ORDER — FENTANYL CITRATE (PF) 100 MCG/2ML IJ SOLN
INTRAMUSCULAR | Status: AC
  Filled 2018-11-18: qty 2

## 2018-11-18 MED ORDER — PROPOFOL 10 MG/ML IV BOLUS
INTRAVENOUS | Status: AC
  Filled 2018-11-18: qty 40

## 2018-11-18 MED ORDER — CELECOXIB 200 MG PO CAPS
ORAL_CAPSULE | ORAL | Status: AC
  Filled 2018-11-18: qty 1

## 2018-11-18 MED ORDER — PROPOFOL 10 MG/ML IV BOLUS
INTRAVENOUS | Status: DC | PRN
  Administered 2018-11-18: 20 mg via INTRAVENOUS
  Administered 2018-11-18: 10 mg via INTRAVENOUS
  Administered 2018-11-18 (×5): 20 mg via INTRAVENOUS
  Administered 2018-11-18: 10 mg via INTRAVENOUS

## 2018-11-18 SURGICAL SUPPLY — 60 items
APL SKNCLS STERI-STRIP NONHPOA (GAUZE/BANDAGES/DRESSINGS)
BENZOIN TINCTURE PRP APPL 2/3 (GAUZE/BANDAGES/DRESSINGS) ×2 IMPLANT
BLADE EXTENDED COATED 6.5IN (ELECTRODE) IMPLANT
BLADE HEX COATED 2.75 (ELECTRODE) ×3 IMPLANT
BLADE SURG 10 STRL SS (BLADE) IMPLANT
BLADE SURG 15 STRL LF DISP TIS (BLADE) ×1 IMPLANT
BLADE SURG 15 STRL SS (BLADE) ×3
BRIEF STRETCH FOR OB PAD LRG (UNDERPADS AND DIAPERS) ×4 IMPLANT
CANISTER SUCT 3000ML PPV (MISCELLANEOUS) ×3 IMPLANT
COVER BACK TABLE 60X90IN (DRAPES) ×3 IMPLANT
COVER MAYO STAND STRL (DRAPES) ×3 IMPLANT
COVER WAND RF STERILE (DRAPES) ×2 IMPLANT
DECANTER SPIKE VIAL GLASS SM (MISCELLANEOUS) ×1 IMPLANT
DRAPE LAPAROTOMY 100X72 PEDS (DRAPES) ×1 IMPLANT
DRAPE UTILITY XL STRL (DRAPES) ×1 IMPLANT
GAUZE SPONGE 4X4 12PLY STRL (GAUZE/BANDAGES/DRESSINGS) ×3 IMPLANT
GLOVE BIO SURGEON STRL SZ7.5 (GLOVE) ×3 IMPLANT
GLOVE INDICATOR 8.0 STRL GRN (GLOVE) ×3 IMPLANT
GOWN STRL REUS W/TWL LRG LVL3 (GOWN DISPOSABLE) ×3 IMPLANT
HYDROGEN PEROXIDE 16OZ (MISCELLANEOUS) IMPLANT
IV CATH 14GX2 1/4 (CATHETERS) IMPLANT
IV CATH 18G SAFETY (IV SOLUTION) IMPLANT
KIT TURNOVER CYSTO (KITS) ×3 IMPLANT
LEGGING LITHOTOMY PAIR STRL (DRAPES) ×2 IMPLANT
LOOP VESSEL MAXI BLUE (MISCELLANEOUS) IMPLANT
NDL SAFETY ECLIPSE 18X1.5 (NEEDLE) IMPLANT
NEEDLE HYPO 18GX1.5 SHARP (NEEDLE)
NEEDLE HYPO 22GX1.5 SAFETY (NEEDLE) ×1 IMPLANT
NS IRRIG 500ML POUR BTL (IV SOLUTION) ×3 IMPLANT
PACK BASIN DAY SURGERY FS (CUSTOM PROCEDURE TRAY) ×3 IMPLANT
PAD ABD 8X10 STRL (GAUZE/BANDAGES/DRESSINGS) ×3 IMPLANT
PAD ARMBOARD 7.5X6 YLW CONV (MISCELLANEOUS) IMPLANT
PENCIL BUTTON HOLSTER BLD 10FT (ELECTRODE) ×3 IMPLANT
SPONGE HEMORRHOID 8X3CM (HEMOSTASIS) IMPLANT
SPONGE SURGIFOAM ABS GEL 12-7 (HEMOSTASIS) IMPLANT
SUCTION FRAZIER HANDLE 10FR (MISCELLANEOUS)
SUCTION TUBE FRAZIER 10FR DISP (MISCELLANEOUS) IMPLANT
SUT CHROMIC 2 0 SH (SUTURE) IMPLANT
SUT CHROMIC 3 0 SH 27 (SUTURE) IMPLANT
SUT MNCRL AB 4-0 PS2 18 (SUTURE) IMPLANT
SUT SILK 0 PSL NDL (SUTURE) IMPLANT
SUT SILK 0 TIES 10X30 (SUTURE) IMPLANT
SUT SILK 2 0 (SUTURE)
SUT SILK 2-0 18XBRD TIE 12 (SUTURE) IMPLANT
SUT VIC AB 2-0 SH 27 (SUTURE)
SUT VIC AB 2-0 SH 27XBRD (SUTURE) IMPLANT
SUT VIC AB 3-0 SH 18 (SUTURE) IMPLANT
SUT VIC AB 3-0 SH 27 (SUTURE)
SUT VIC AB 3-0 SH 27X BRD (SUTURE) IMPLANT
SUT VIC AB 3-0 SH 27XBRD (SUTURE) IMPLANT
SUT VIC AB 4-0 P-3 18XBRD (SUTURE) IMPLANT
SUT VIC AB 4-0 P3 18 (SUTURE)
SYR 20CC LL (SYRINGE) IMPLANT
SYR BULB IRRIGATION 50ML (SYRINGE) ×1 IMPLANT
SYR CONTROL 10ML LL (SYRINGE) ×3 IMPLANT
TOWEL OR 17X26 10 PK STRL BLUE (TOWEL DISPOSABLE) ×3 IMPLANT
TRAY DSU PREP LF (CUSTOM PROCEDURE TRAY) ×3 IMPLANT
TUBE CONNECTING 12'X1/4 (SUCTIONS) ×1
TUBE CONNECTING 12X1/4 (SUCTIONS) ×2 IMPLANT
YANKAUER SUCT BULB TIP NO VENT (SUCTIONS) ×3 IMPLANT

## 2018-11-18 NOTE — Discharge Instructions (Addendum)
ANORECTAL SURGERY: POST OP INSTRUCTIONS  Your anorectal exam was notable for hemorrhoids but no abnormal masses/lesions were identified.  1. DIET: Follow a light bland diet the first 24 hours after arrival home, such as soup, liquids, crackers, etc.  Be sure to include lots of fluids daily.  Avoid fast food or heavy meals as your are more likely to get nauseated.  Eat a low fat diet the next few days after surgery.    2. Take your usually prescribed home medications unless otherwise directed.  3. PAIN CONTROL: a. It is helpful to take an over-the-counter pain medication regularly for the first few days/weeks.  Choose from the following that works best for you: i. Ibuprofen (Advil, etc) Three 200mg  tabs every 6 hours as needed. ii. Acetaminophen (Tylenol, etc) 500-650mg  every 6 hours as needed iii. NOTE: You may take both of these medications together - most patients find it most helpful when alternating between the two (i.e. Ibuprofen at 6am, tylenol at 9am, ibuprofen at 12pm ...) b. A  prescription for pain medication may have been prescribed for you at discharge.  Take your pain medication as prescribed.  i. If you are having problems/concerns with the prescription medicine, please call us for further advice.  4. Avoid getting constipated.  Between the surgery and the pain medications, it is common to experience some constipation.  Increasing fluid intake (64oz of water per day) and taking a fiber supplement (such as Metamucil, Citrucel, FiberCon) 1-2 times a day regularly will usually help prevent this problem from occurring.  Take Miralax (over the counter) 1-2x/day while taking a narcotic pain medication. If no bowel movement after 48hours, you may additionally take a laxative like a bottle of Milk of Magnesia which can be purchased over the counter. Avoid enemas if possible as these are often painful.   5. Watch out for diarrhea.  If you have many loose bowel movements, simplify your diet to  bland foods.  Stop any stool softeners and decrease your fiber supplement. If this worsens or does not improve, please call us.  6. Wash / shower every day.  If you were discharged with a dressing, you may remove this the day after your surgery. You may shower normally, getting soap/water on your wound, particularly after bowel movements.  7. Soaking in a warm bath filled a couple inches ("Sitz bath") is a great way to clean the area after a bowel movement and many patients find it is a way to soothe the area.  8. ACTIVITIES as tolerated:   a. You may resume regular (light) daily activities beginning the next day--such as daily self-care, walking, climbing stairs--gradually increasing activities as tolerated.  If you can walk 30 minutes without difficulty, it is safe to try more intense activity such as jogging, treadmill, bicycling, low-impact aerobics, etc. b. Refrain from any heavy lifting or straining for the first 2 weeks after your procedure, particularly if your surgery was for hemorrhoids. c. Avoid activities that make your pain worse d. You may drive when you are no longer taking prescription pain medication, you can comfortably wear a seatbelt, and you can safely maneuver your car and apply brakes.  9. FOLLOW UP with gynecology as discussed in the office for further examination of the heavy periods, anemia and to ensure no gynecologic neoplasm as the cause of the groin lymph node swelling a. Should you need additional follow-up for the hemorrhoids, please contact [facility] at [phone] to set up an appointment to see your  surgeon in the office for a follow-up appointment.   When to call us [phone]: 1. Poor pain control 2. Reactions / problems with new medications (rash/itching, etc)  3. Fever over 101.5 F (38.5 C) 4. Inability to urinate 5. Nausea/vomiting 6. Worsening swelling or bruising 7. Continued bleeding from incision. 8. Increased pain, redness, or drainage from the  incision  The clinic staff is available to answer your questions during regular business hours (8:30am-5pm).  Please dont hesitate to call and ask to speak to one of our nurses for clinical concerns.   A surgeon from [facility] is always on call at the hospitals   If you have a medical emergency, go to the nearest emergency room or call 911.   [facility], [place], [place], [place], [place]  [address] ? MAIN: [phone] FAX [phone] [email or web address]  Post Anesthesia Home Care Instructions  Activity: Get plenty of rest for the remainder of the day. A responsible individual must stay with you for 24 hours following the procedure.  For the next 24 hours, DO NOT: -Drive a car -Paediatric nurse -Drink alcoholic beverages -Take any medication unless instructed by your physician -Make any legal decisions or sign important papers.  Meals: Start with liquid foods such as gelatin or soup. Progress to regular foods as tolerated. Avoid greasy, spicy, heavy foods. If nausea and/or vomiting occur, drink only clear liquids until the nausea and/or vomiting subsides. Call your physician if vomiting continues.  Special Instructions/Symptoms: Your throat may feel dry or sore from the anesthesia or the breathing tube placed in your throat during surgery. If this causes discomfort, gargle with warm salt water. The discomfort should disappear within 24 hours.  If you had a scopolamine patch placed behind your ear for the management of post- operative nausea and/or vomiting:  1. The medication in the patch is effective for 72 hours, after which it should be removed.  Wrap patch in a tissue and discard in the trash. Wash hands thoroughly with soap and water. 2. You may remove the patch earlier than 72 hours if you experience unpleasant side effects which may include dry mouth, dizziness or visual disturbances. 3. Avoid touching the patch.  Wash your hands with soap and water after contact with the patch.

## 2018-11-18 NOTE — Anesthesia Postprocedure Evaluation (Signed)
Anesthesia Post Note  Patient: Leah Baker  Procedure(s) Performed: ANORECTAL EXAM UNDER ANESTHESIA (N/A Rectum)     Patient location during evaluation: PACU Anesthesia Type: MAC Level of consciousness: awake and alert Pain management: pain level controlled Vital Signs Assessment: post-procedure vital signs reviewed and stable Respiratory status: spontaneous breathing, nonlabored ventilation, respiratory function stable and patient connected to nasal cannula oxygen Cardiovascular status: stable and blood pressure returned to baseline Postop Assessment: no apparent nausea or vomiting Anesthetic complications: no    Last Vitals:  Vitals:   11/18/18 0947 11/18/18 1000  BP: 93/68 101/74  Pulse: 95 84  Resp: 18 16  Temp:    SpO2: 100% 93%    Last Pain:  Vitals:   11/18/18 1000  TempSrc:   PainSc: 0-No pain                 Montez Hageman

## 2018-11-18 NOTE — Anesthesia Preprocedure Evaluation (Signed)
Anesthesia Evaluation  Patient identified by MRN, date of birth, ID band Patient awake    Reviewed: Allergy & Precautions, NPO status , Patient's Chart, lab work & pertinent test results  History of Anesthesia Complications (+) PONV  Airway Mallampati: II  TM Distance: >3 FB Neck ROM: Full    Dental no notable dental hx.    Pulmonary neg pulmonary ROS, former smoker,    Pulmonary exam normal breath sounds clear to auscultation       Cardiovascular negative cardio ROS Normal cardiovascular exam Rhythm:Regular Rate:Normal     Neuro/Psych negative neurological ROS  negative psych ROS   GI/Hepatic negative GI ROS, Neg liver ROS,   Endo/Other  negative endocrine ROS  Renal/GU negative Renal ROS  negative genitourinary   Musculoskeletal negative musculoskeletal ROS (+)   Abdominal   Peds negative pediatric ROS (+)  Hematology negative hematology ROS (+)   Anesthesia Other Findings   Reproductive/Obstetrics negative OB ROS                             Anesthesia Physical Anesthesia Plan  ASA: II  Anesthesia Plan: MAC   Post-op Pain Management:    Induction: Intravenous  PONV Risk Score and Plan: 3 and Ondansetron and Treatment may vary due to age or medical condition  Airway Management Planned: Simple Face Mask  Additional Equipment:   Intra-op Plan:   Post-operative Plan:   Informed Consent: I have reviewed the patients History and Physical, chart, labs and discussed the procedure including the risks, benefits and alternatives for the proposed anesthesia with the patient or authorized representative who has indicated his/her understanding and acceptance.     Dental advisory given  Plan Discussed with: CRNA  Anesthesia Plan Comments:         Anesthesia Quick Evaluation

## 2018-11-18 NOTE — H&P (Signed)
CC: Here today for surgery  HPI: Leah Baker is a very pleasant 21yoF with hx of menorrhagia, depression and states she has had a four-month history of prolapsing anal canal tissue and 3 month history of bleeding with bowel movements. She states that the bleeding is increased when she has her menstrual cycles and decreases between. She reports occasional anal pain with movements but nothing in between. She has undergone a fairly extensive evaluation. She was noted to have some right inguinal adenopathy and swelling and has undergone evaluation of this. She's had significant anemia that she attributes to menorrhagia.  She had a colonoscopy with Dr. Alessandra Bevels 09/01/2018 which was notable for perianal skin tags, internal hemorrhoids, but otherwise normal. She was noted to have small amount of oozing blood from the internal hemorrhoids. No abnormalities aside from the internal hemorrhoids were noted on retroflexion.  CT abdomen/pelvis 09/14/2018 demonstrated enlarged right inguinal and iliac lymph nodes, largest right iliac lymph node measuring 2.3 x 2.0 cm.  She underwent ultrasound-guided biopsy of the inguinal node 09/29/2018 and this is done as a core biopsy. This demonstrated lymphoid hyperplasia. Flow cytometry was negative for lymphoma.  She denies any history of fecal incontinence to gas, liquid, or solid stool. She denies any prior vaginal deliveries. She denies any history of vaginal and rectal trauma.  She has an appointment to see her gynecologist in the next couple of weeks for her gyn exam  As for her hemorrhoidal complaints, she has been taking MiraLAX once daily that she began 2 months ago. She has noted some improvement in her symptoms since doing this. She reports having 1 soft formed BM per day. She drinks 6-8 bottles of water per day. When she does have a bowel movement, she reports spending 10-15 minutes on the commode. She denies having to strain with bowel movements.  She is not taking a fiber supplement   PMH: Menorrhagia (uncontrolled); depression (controlled well now with medication)  PSH: Denies any prior anorectal procedures; C-Sx x2  FHx: Denies FHx of malignancy.  Social: Denies use of tobacco/EtOH/drugs. 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  ROS: A comprehensive 10 system review of systems was completed with the patient and pertinent findings as noted above.   Past Medical History:  Diagnosis Date   Acute costochondritis 11/08/2018   anterior chest wall pain  per pt pcp note in epic on 11-10-2018   Hemorrhoids    History of lower GI bleeding 08/31/2018   s/p  colonoscopy 09-01-2018,  hemorrhoids   IDA (iron deficiency anemia)    Inguinal lymphadenopathy    CT 09-14-2018  right side, followed by pcp   PONV (postoperative nausea and vomiting)    only happened once   Wears glasses     Past Surgical History:  Procedure Laterality Date   BIOPSY  09/01/2018   Procedure: BIOPSY;  Surgeon: Otis Brace, MD;  Location: [facility];  Service: Gastroenterology;;   CESAREAN SECTION  01/15/2002   @[facility]    CESAREAN SECTION  03/17/2011   Procedure: CESAREAN SECTION;  Surgeon: Osborne Oman, MD;  Location: [facility];  Service: Gynecology;  Laterality: N/A;   COLONOSCOPY WITH PROPOFOL N/A 09/01/2018   Procedure: COLONOSCOPY WITH PROPOFOL;  Surgeon: Otis Brace, MD;  Location: [facility];  Service: Gastroenterology;  Laterality: N/A;   HYSTEROSCOPY W/D&C  06-21-2009  dr constant @[facility]    w/  REMOVAL IUD  IUD REMOVAL  06/21/2009   KNEE ARTHROSCOPY WITH ANTERIOR CRUCIATE LIGAMENT (ACL) REPAIR Left 09-08-2013   dr [name]  @[facility]    w/  meniscal repair   MULTIPLE TOOTH EXTRACTIONS     TUBAL LIGATION Bilateral 2012    History reviewed. No pertinent family history.  Social:  reports that she quit smoking about 10 years ago. Her smoking use  included cigarettes. She quit after 5.00 years of use. She has never used smokeless tobacco. She reports current alcohol use. She reports previous drug use.  Allergies: No Known Allergies  Medications: I have reviewed the patient's current medications.  No results found for this or any previous visit (from the past 48 hour(s)).  No results found.  ROS - all of the below systems have been reviewed with the patient and positives are indicated with bold text General: chills, fever or night sweats Eyes: blurry vision or double vision ENT: epistaxis or sore throat Allergy/Immunology: itchy/watery eyes or nasal congestion Hematologic/Lymphatic: bleeding problems, blood clots or swollen lymph nodes Endocrine: temperature intolerance or unexpected weight changes Breast: new or changing breast lumps or nipple discharge Resp: cough, shortness of breath, or wheezing CV: chest pain or dyspnea on exertion GI: as per HPI GU: dysuria, trouble voiding, or hematuria MSK: joint pain or joint stiffness Neuro: TIA or stroke symptoms Derm: pruritus and skin lesion changes Psych: anxiety and depression  PE Blood pressure 110/68, pulse (!) 106, temperature 98.7 F (37.1 C), temperature source Oral, resp. rate 16, height 5\' 2"  (1.575 m), weight 69.4 kg, last menstrual period 10/22/2018, SpO2 100 %. Constitutional: NAD; conversant; no deformities Eyes: Moist conjunctiva; no lid lag; anicteric; PERRL Neck: Trachea midline; no thyromegaly Lungs: Normal respiratory effort; no tactile fremitus CV: RRR; no palpable thrills; no pitting edema GI: Abd soft, NT/ND; no palpable hepatosplenomegaly MSK: Normal gait; no clubbing/cyanosis Psychiatric: Appropriate affect; alert and oriented x3 Lymphatic: No palpable cervical or axillary lymphadenopathy  No results found for this or any previous visit (from the past 48 hour(s)).  No results found.  A/P: Leah Baker is a very pleasant 43yoF with hx of menorrhagia,  iron deficiency anemia, depression here today as a referral for evaluation of hemorrhoids. She also has inguinal adenopathy that thus far on biopsy has not showed any evidence of malignancy.  -We discussed the importance of follow-up with her gynecologist for pelvic exam/GYN exam -Given new onset rectal bleeding and anal adenopathy although biopsies thus far been benign, we discussed importance of the good anorectal exam. Given the relatively limited extent of which we can visualize things due to poor tolerance with anoscopy, I have recommended anorectal examination under anesthesia to further evaluate the anal canal with possible biopsies of any abnormal findings if any are seen. -The anatomy and physiology of the anal canal was discussed at length with the patient. The pathophysiology of rectal bleeding and hemorrhoids was discussed at length with associated pictures and illustrations. -The planned procedure, material risks (including, but not limited to, pain, bleeding, non-diagnostic, need for additional procedures) benefits and alternatives to surgery were discussed at length. We discussed this procedure not be performed for the purposes of curing her of the rectal bleeding (or removing hemorrhoids per se) but for diagnostic purposes. The patient's questions were answered to her satisfaction, she voiced understanding and elected to proceed with surgery. Additionally, we discussed typical postoperative expectations and the recovery process.  Sharon Mt. Dema Severin, [name] General and Colorectal Surgery [facility]

## 2018-11-18 NOTE — Transfer of Care (Signed)
Immediate Anesthesia Transfer of Care Note  Patient: Leah Baker  Procedure(s) Performed: ANORECTAL EXAM UNDER ANESTHESIA (N/A Rectum)  Patient Location: PACU  Anesthesia Type:MAC  Level of Consciousness: awake, oriented, drowsy and patient cooperative  Airway & Oxygen Therapy: Patient Spontanous Breathing and Patient connected to face mask oxygen  Post-op Assessment: Report given to RN and Post -op Vital signs reviewed and stable  Post vital signs: Reviewed and stable  Last Vitals:  Vitals Value Taken Time  BP 87/68 11/18/18 0935  Temp    Pulse 96 11/18/18 0937  Resp 14 11/18/18 0937  SpO2 100 % 11/18/18 0937  Vitals shown include unvalidated device data.  Last Pain:  Vitals:   11/18/18 0850  TempSrc:   PainSc: 5       Patients Stated Pain Goal: 5 (93/23/55 7322)  Complications: No apparent anesthesia complications

## 2018-11-18 NOTE — Op Note (Signed)
11/18/2018  9:28 AM  PATIENT:  Leah Baker  38 y.o. female  Patient Care Team: Provider, Kennieth Rad, MD as PCP - General (Internal Medicine)  PRE-OPERATIVE DIAGNOSIS:  1. Bright red blood per rectum 2. Right inguinal lymphadenopathy 3. Iron deficiency anemia 4. Inability to tolerate adequate anorectal exam in office  POST-OPERATIVE DIAGNOSIS:  1. Bright red blood per rectum 2. Right inguinal lymphadenopathy 3. Iron deficiency anemia 4. Inability to tolerate adequate anorectal exam in office 5. Mixed 3 column internal and external hemorrhoids  PROCEDURE:   1. Anorectal exam under anesthesia  SURGEON:  Surgeon(s): Ileana Roup, MD  ASSISTANT: OR staff   ANESTHESIA:   MAC  SPECIMEN:  No Specimen  DISPOSITION OF SPECIMEN:  N/A  COUNTS:  Sponge, needle, and instrument counts were reported correct x2 at conclusion.  EBL: none  Drains: none  PLAN OF CARE: Discharge to home after PACU  PATIENT DISPOSITION:  PACU - hemodynamically stable.  INDICATION: Leah Baker is a very pleasant 72yoF with hx of menorrhagia, depression and states she has had a four-month history of prolapsing anal canal tissue and 3 month history of bleeding with bowel movements. She states that the bleeding is increased when she has her menstrual cycles and decreases between. She reports occasional anal pain with movements but nothing in between. She has undergone a fairly extensive evaluation. She was noted to have some right inguinal adenopathy and swelling and has undergone evaluation of this. She's had significant anemia that she attributes to menorrhagia.  She had a colonoscopy with Dr. Alessandra Bevels 09/01/2018 which was notable for perianal skin tags, internal hemorrhoids, but otherwise normal. She was noted to have small amount of oozing blood from the internal hemorrhoids. No abnormalities aside from the internal hemorrhoids were noted on retroflexion.  CT abdomen/pelvis 09/14/2018  demonstrated enlarged right inguinal and iliac lymph nodes, largest right iliac lymph node measuring 2.3 x 2.0 cm.  She underwent ultrasound-guided biopsy of the inguinal node 09/29/2018 and this is done as a core biopsy. This demonstrated lymphoid hyperplasia. Flow cytometry was negative for lymphoma.  She denies any history of fecal incontinence to gas, liquid, or solid stool. She denies any prior vaginal deliveries. She denies any history of vaginal and rectal trauma.  She has an appointment to see her gynecologist in the next couple of weeks for her gyn exam  As for her hemorrhoidal complaints, she has been taking MiraLAX once daily that she began 2 months ago. She has noted some improvement in her symptoms since doing this. She reports having 1 soft formed BM per day. She drinks 6-8 bottles of water per day. When she does have a bowel movement, she reports spending 10-15 minutes on the commode. She denies having to strain with bowel movements. She is not taking a fiber supplement  She was unable to tolerate adequate anorectal exam in the office to exclude an anal source such as malignancy. For this reason, we discussed proceeding with an anorectal exam under anesthesia. Please refer to notes elsewhere for details regarding this discussion.  OR FINDINGS: No external perianal lesions. Mixed 3 column internal and external hemorrhoids grade II/III. Normal appearing anal canal otherwise without fissure, ulcerations or unusual appearing tissue.  DESCRIPTION: The patient was identified in the preoperative holding area and taken to the OR where she was placed on the operating room table. SCDs were placed.  MAC anesthesia was induced without difficulty. The patient was then positioned in high lithotomy with Allen stirrups. She  was then prepped and draped in usual sterile fashion.  A surgical timeout was performed indicating the correct patient, procedure, positioning.  Aside from small skin  tags, there were no notable external abnormalities. A well lubricated digital rectal exam was performed which demonstrated no palpable abnormalities in the anal canal or distal rectum.  A Hill-Ferguson anoscope was into the anal canal and circumferential inspection demonstrated fairly large internal hemorrhoids - 3 columns. These were all carefully inspected and no abnormal appearing epithelium noted. No fissures were identified. No ulcerations or masses were identified.  A bimanual exam was then completed and noted to be normal. All counts were reported correct x2. She was then awoken from anesthesia and transferred to a stretcher for transport to PACU in satisfactory condition.  DISPOSITION: PACU in satisfactory condition.

## 2018-11-19 ENCOUNTER — Encounter (HOSPITAL_BASED_OUTPATIENT_CLINIC_OR_DEPARTMENT_OTHER): Payer: Self-pay | Admitting: Surgery

## 2018-11-24 ENCOUNTER — Other Ambulatory Visit: Payer: Self-pay

## 2018-11-24 ENCOUNTER — Encounter: Payer: Self-pay | Admitting: Critical Care Medicine

## 2018-11-24 ENCOUNTER — Ambulatory Visit: Payer: Medicaid Other | Attending: Critical Care Medicine | Admitting: Critical Care Medicine

## 2018-11-24 DIAGNOSIS — M940 Chondrocostal junction syndrome [Tietze]: Secondary | ICD-10-CM | POA: Diagnosis not present

## 2018-11-24 DIAGNOSIS — D500 Iron deficiency anemia secondary to blood loss (chronic): Secondary | ICD-10-CM | POA: Diagnosis not present

## 2018-11-24 DIAGNOSIS — R1031 Right lower quadrant pain: Secondary | ICD-10-CM | POA: Diagnosis not present

## 2018-11-24 DIAGNOSIS — R590 Localized enlarged lymph nodes: Secondary | ICD-10-CM | POA: Insufficient documentation

## 2018-11-24 DIAGNOSIS — K649 Unspecified hemorrhoids: Secondary | ICD-10-CM

## 2018-11-24 NOTE — Assessment & Plan Note (Signed)
Significant internal hemorrhoids with associated inguinal lymphadenopathy  We will now refer to gynecology to rule out any gynecologic pathology and then await follow-up with general surgery to see if direct treatment of the hemorrhoids will be undertaken

## 2018-11-24 NOTE — Assessment & Plan Note (Signed)
Inguinal lymphadenopathy was benign on biopsy

## 2018-11-24 NOTE — Assessment & Plan Note (Signed)
It is unclear to me what is causing the patient's right lower quadrant abdominal pain

## 2018-11-24 NOTE — Assessment & Plan Note (Signed)
The patient did have acute costochondritis and this is resolved with Voltaren gel topical

## 2018-11-24 NOTE — Progress Notes (Signed)
Patient ID: Leah Baker, female   [name]: 1980/10/08, 38 y.o.   MRN: [id number] Virtual Visit via Telephone Note  I connected with Leah Baker on 11/24/18 at 10:30 AM EDT by telephone and verified that I am speaking with the correct person using two identifiers.   Consent:  I discussed the limitations, risks, security and privacy concerns of performing an evaluation and management service by telephone and the availability of in person appointments. I also discussed with the patient that there may be a patient responsible charge related to this service. The patient expressed understanding and agreed to proceed.  Location of patient: The patient was at home  Location of provider: I was in my office  Persons participating in the televisit with the patient.   There was no one else on the visit  History of Present Illness: This is a 38 year old female with significant internal hemorrhoids and associated lower gastrointestinal bleeding and associated right inguinal and iliac lymphadenopathy with right lower quadrant abdominal pain.  I have been following this patient since April 7 of this year for the lower GI bleeding and associated  anemia.  The hemoglobin is remained in the 8.8-9.0 range and the patient is on iron supplementation.  The patient has now been seen by general surgery Dr. Dema Severin who performed an anoscopy under anesthesia this revealed presence of internal hemorrhoids without ulceration or fissures  No specific treatment of the hemorrhoids were done at this visit.  General surgery is now recommending a gynecology evaluation.  CT scan of the abdomen did not suggest pelvic abnormalities and the patient denies any vaginal bleeding and has regular periods without excess bleeding and periods.  Note the lymph node biopsy was benign when obtained of the right inguinal lymph node.  The patient denies any shortness of breath nausea vomiting or dysuria.  She will bleed both before and after a bowel  movement.  She is on a stool softener and this is resulted in improved bowel movements but the bleeding still continues.    Another issue was costochondritis pain which actually has improved with Voltaren gel topical  Anal exam under anesthesia 6/18 per Dr. Dema Severin is as below: OR FINDINGS: No external perianal lesions. Mixed 3 column internal and external hemorrhoids grade II/III. Normal appearing anal canal otherwise without fissure, ulcerations or unusual appearing tissue.  DESCRIPTION: The patient was identified in the preoperative holding area and taken to the OR where she was placed on the operating room table. SCDs were placed.  MAC anesthesia was induced without difficulty. The patient was then positioned in high lithotomy with Allen stirrups. She was then prepped and draped in usual sterile fashion.  A surgical timeout was performed indicating the correct patient, procedure, positioning.  Aside from small skin tags, there were no notable external abnormalities. A well lubricated digital rectal exam was performed which demonstrated no palpable abnormalities in the anal canal or distal rectum.  A Hill-Ferguson anoscope was into the anal canal and circumferential inspection demonstrated fairly large internal hemorrhoids - 3 columns. These were all carefully inspected and no abnormal appearing epithelium noted. No fissures were identified. No ulcerations or masses were identified.  A bimanual exam was then completed and noted to be normal. All counts were reported correct x2. She was then awoken from anesthesia and transferred to a stretcher for transport to PACU in satisfactory condition.  Observations/Objective: No observations as this is a telephone visit  Assessment and Plan: I personally reviewed all images and lab  data in the [facility] system as well as any outside material available during this office visit and agree with the  radiology impressions.   Hemorrhoid Significant internal  hemorrhoids with associated inguinal lymphadenopathy  We will now refer to gynecology to rule out any gynecologic pathology and then await follow-up with general surgery to see if direct treatment of the hemorrhoids will be undertaken  Costochondritis, acute The patient did have acute costochondritis and this is resolved with Voltaren gel topical  Inguinal lymphadenopathy Inguinal lymphadenopathy was benign on biopsy  Right lower quadrant abdominal pain It is unclear to me what is causing the patient's right lower quadrant abdominal pain   [name] was seen today for hospitalization follow-up.  Diagnoses and all orders for this visit:  Inguinal lymphadenopathy -     Ambulatory referral to Gynecology  Right lower quadrant pain -     Ambulatory referral to Gynecology  Costochondritis, acute  Hemorrhoids, unspecified hemorrhoid type  Iron deficiency anemia due to chronic blood loss  Right lower quadrant abdominal pain       Follow Up Instructions: The patient knows a referral to gynecology will be made   I discussed the assessment and treatment plan with the patient. The patient was provided an opportunity to ask questions and all were answered. The patient agreed with the plan and demonstrated an understanding of the instructions.   The patient was advised to call back or seek an in-person evaluation if the symptoms worsen or if the condition fails to improve as anticipated.  I provided 30 minutes of non-face-to-face time during this encounter  including  median intraservice time , review of notes, labs, imaging, medications  and explaining diagnosis and management to the patient .    Asencion Noble, MD

## 2018-11-26 ENCOUNTER — Telehealth: Payer: Self-pay | Admitting: Critical Care Medicine

## 2018-11-26 ENCOUNTER — Encounter (HOSPITAL_COMMUNITY): Payer: Self-pay | Admitting: Emergency Medicine

## 2018-11-26 ENCOUNTER — Telehealth (HOSPITAL_COMMUNITY): Payer: Self-pay | Admitting: Emergency Medicine

## 2018-11-26 ENCOUNTER — Encounter (HOSPITAL_COMMUNITY): Payer: Self-pay

## 2018-11-26 ENCOUNTER — Other Ambulatory Visit: Payer: Self-pay

## 2018-11-26 ENCOUNTER — Ambulatory Visit (HOSPITAL_COMMUNITY)
Admission: EM | Admit: 2018-11-26 | Discharge: 2018-11-26 | Disposition: A | Discharge status: Home / Self Care | Payer: Medicaid Other | Source: Home / Self Care | Attending: Internal Medicine | Admitting: Internal Medicine

## 2018-11-26 ENCOUNTER — Observation Stay (HOSPITAL_COMMUNITY)
Admission: EM | Admit: 2018-11-26 | Discharge: 2018-11-27 | Disposition: A | Discharge status: Home / Self Care | Payer: Medicaid Other | Attending: Internal Medicine | Admitting: Internal Medicine

## 2018-11-26 DIAGNOSIS — F329 Major depressive disorder, single episode, unspecified: Secondary | ICD-10-CM | POA: Diagnosis not present

## 2018-11-26 DIAGNOSIS — Z87891 Personal history of nicotine dependence: Secondary | ICD-10-CM | POA: Diagnosis not present

## 2018-11-26 DIAGNOSIS — M940 Chondrocostal junction syndrome [Tietze]: Secondary | ICD-10-CM | POA: Diagnosis present

## 2018-11-26 DIAGNOSIS — N920 Excessive and frequent menstruation with regular cycle: Secondary | ICD-10-CM | POA: Diagnosis not present

## 2018-11-26 DIAGNOSIS — D649 Anemia, unspecified: Secondary | ICD-10-CM | POA: Diagnosis present

## 2018-11-26 DIAGNOSIS — K648 Other hemorrhoids: Secondary | ICD-10-CM | POA: Diagnosis not present

## 2018-11-26 DIAGNOSIS — R591 Generalized enlarged lymph nodes: Secondary | ICD-10-CM | POA: Insufficient documentation

## 2018-11-26 DIAGNOSIS — R1031 Right lower quadrant pain: Secondary | ICD-10-CM | POA: Diagnosis present

## 2018-11-26 DIAGNOSIS — K644 Residual hemorrhoidal skin tags: Secondary | ICD-10-CM | POA: Insufficient documentation

## 2018-11-26 DIAGNOSIS — Z79899 Other long term (current) drug therapy: Secondary | ICD-10-CM | POA: Insufficient documentation

## 2018-11-26 DIAGNOSIS — K649 Unspecified hemorrhoids: Secondary | ICD-10-CM | POA: Diagnosis not present

## 2018-11-26 DIAGNOSIS — E876 Hypokalemia: Secondary | ICD-10-CM | POA: Diagnosis present

## 2018-11-26 DIAGNOSIS — N946 Dysmenorrhea, unspecified: Secondary | ICD-10-CM | POA: Insufficient documentation

## 2018-11-26 DIAGNOSIS — Z1159 Encounter for screening for other viral diseases: Secondary | ICD-10-CM | POA: Insufficient documentation

## 2018-11-26 DIAGNOSIS — R1032 Left lower quadrant pain: Secondary | ICD-10-CM | POA: Insufficient documentation

## 2018-11-26 DIAGNOSIS — K921 Melena: Secondary | ICD-10-CM | POA: Insufficient documentation

## 2018-11-26 DIAGNOSIS — R590 Localized enlarged lymph nodes: Secondary | ICD-10-CM | POA: Insufficient documentation

## 2018-11-26 DIAGNOSIS — D259 Leiomyoma of uterus, unspecified: Secondary | ICD-10-CM | POA: Diagnosis not present

## 2018-11-26 DIAGNOSIS — D509 Iron deficiency anemia, unspecified: Principal | ICD-10-CM | POA: Diagnosis present

## 2018-11-26 DIAGNOSIS — Z791 Long term (current) use of non-steroidal anti-inflammatories (NSAID): Secondary | ICD-10-CM | POA: Diagnosis not present

## 2018-11-26 DIAGNOSIS — K625 Hemorrhage of anus and rectum: Secondary | ICD-10-CM | POA: Diagnosis present

## 2018-11-26 DIAGNOSIS — Z20828 Contact with and (suspected) exposure to other viral communicable diseases: Secondary | ICD-10-CM | POA: Diagnosis not present

## 2018-11-26 LAB — CBC
HCT: 20.3 % — ABNORMAL LOW (ref 36.0–46.0)
Hemoglobin: 6 g/dL — CL (ref 12.0–15.0)
MCH: 23 pg — ABNORMAL LOW (ref 26.0–34.0)
MCHC: 29.6 g/dL — ABNORMAL LOW (ref 30.0–36.0)
MCV: 77.8 fL — ABNORMAL LOW (ref 80.0–100.0)
Platelets: 546 10*3/uL — ABNORMAL HIGH (ref 150–400)
RBC: 2.61 MIL/uL — ABNORMAL LOW (ref 3.87–5.11)
RDW: 17 % — ABNORMAL HIGH (ref 11.5–15.5)
WBC: 8 10*3/uL (ref 4.0–10.5)
nRBC: 0 % (ref 0.0–0.2)

## 2018-11-26 LAB — COMPREHENSIVE METABOLIC PANEL
ALT: 17 U/L (ref 0–44)
AST: 20 U/L (ref 15–41)
Albumin: 3.8 g/dL (ref 3.5–5.0)
Alkaline Phosphatase: 43 U/L (ref 38–126)
Anion gap: 10 (ref 5–15)
BUN: 14 mg/dL (ref 6–20)
CO2: 21 mmol/L — ABNORMAL LOW (ref 22–32)
Calcium: 8.7 mg/dL — ABNORMAL LOW (ref 8.9–10.3)
Chloride: 103 mmol/L (ref 98–111)
Creatinine, Ser: 1.02 mg/dL — ABNORMAL HIGH (ref 0.44–1.00)
GFR calc Af Amer: >60 mL/min (ref >60)
GFR calc non Af Amer: >60 mL/min (ref >60)
Glucose, Bld: 102 mg/dL — ABNORMAL HIGH (ref 70–99)
Potassium: 3.1 mmol/L — ABNORMAL LOW (ref 3.5–5.1)
Sodium: 134 mmol/L — ABNORMAL LOW (ref 135–145)
Total Bilirubin: 0.2 mg/dL — ABNORMAL LOW (ref 0.3–1.2)
Total Protein: 6.8 g/dL (ref 6.5–8.1)

## 2018-11-26 LAB — CBC WITH DIFFERENTIAL/PLATELET
Abs Immature Granulocytes: 0.03 10*3/uL (ref 0.00–0.07)
Basophils Absolute: 0.1 10*3/uL (ref 0.0–0.1)
Basophils Relative: 1 %
Eosinophils Absolute: 0.2 10*3/uL (ref 0.0–0.5)
Eosinophils Relative: 2 %
HCT: 21.2 % — ABNORMAL LOW (ref 36.0–46.0)
Hemoglobin: 6.5 g/dL — CL (ref 12.0–15.0)
Immature Granulocytes: 0 %
Lymphocytes Relative: 30 %
Lymphs Abs: 2.8 10*3/uL (ref 0.7–4.0)
MCH: 23.3 pg — ABNORMAL LOW (ref 26.0–34.0)
MCHC: 30.7 g/dL (ref 30.0–36.0)
MCV: 76 fL — ABNORMAL LOW (ref 80.0–100.0)
Monocytes Absolute: 0.7 10*3/uL (ref 0.1–1.0)
Monocytes Relative: 7 %
Neutro Abs: 5.4 10*3/uL (ref 1.7–7.7)
Neutrophils Relative %: 60 %
Platelets: 608 10*3/uL — ABNORMAL HIGH (ref 150–400)
RBC: 2.79 MIL/uL — ABNORMAL LOW (ref 3.87–5.11)
RDW: 16.6 % — ABNORMAL HIGH (ref 11.5–15.5)
WBC: 9.1 10*3/uL (ref 4.0–10.5)
nRBC: 0.2 % (ref 0.0–0.2)

## 2018-11-26 LAB — POCT URINALYSIS DIP (DEVICE)
Bilirubin Urine: NEGATIVE
Glucose, UA: NEGATIVE mg/dL
Ketones, ur: NEGATIVE mg/dL
Leukocytes,Ua: NEGATIVE
Nitrite: NEGATIVE
Protein, ur: 30 mg/dL — AB
Specific Gravity, Urine: >=1.03 (ref 1.005–1.030)
Urobilinogen, UA: 0.2 mg/dL (ref 0.0–1.0)
pH: 6 (ref 5.0–8.0)

## 2018-11-26 LAB — HCG, QUANTITATIVE, PREGNANCY: hCG, Beta Chain, Quant, S: <1 m[IU]/mL (ref <5)

## 2018-11-26 LAB — PREPARE RBC (CROSSMATCH)

## 2018-11-26 LAB — SARS CORONAVIRUS 2 BY RT PCR (HOSPITAL ORDER, PERFORMED IN ~~LOC~~ HOSPITAL LAB): SARS Coronavirus 2: NEGATIVE

## 2018-11-26 LAB — POCT PREGNANCY, URINE: Preg Test, Ur: NEGATIVE

## 2018-11-26 MED ORDER — SODIUM CHLORIDE 0.9 % IV SOLN
10.0000 mL/h | Freq: Once | INTRAVENOUS | Status: AC
  Administered 2018-11-27: 10 mL/h via INTRAVENOUS

## 2018-11-26 MED ORDER — KETOROLAC TROMETHAMINE 30 MG/ML IJ SOLN
INTRAMUSCULAR | Status: AC
  Filled 2018-11-26: qty 1

## 2018-11-26 MED ORDER — TRAMADOL HCL 50 MG PO TABS
50.0000 mg | ORAL_TABLET | Freq: Three times a day (TID) | ORAL | Status: DC | PRN
  Administered 2018-11-26 – 2018-11-27 (×2): 50 mg via ORAL
  Filled 2018-11-26 (×2): qty 1

## 2018-11-26 MED ORDER — TRAMADOL HCL 50 MG PO TABS: 50.0000 mg | ORAL_TABLET | Freq: Three times a day (TID) | ORAL | 0 refills | Status: DC | PRN

## 2018-11-26 MED ORDER — SODIUM CHLORIDE 0.9 % IV SOLN
INTRAVENOUS | Status: DC
  Administered 2018-11-27: 07:00:00 via INTRAVENOUS

## 2018-11-26 MED ORDER — MORPHINE SULFATE (PF) 2 MG/ML IV SOLN
2.0000 mg | INTRAVENOUS | Status: DC | PRN
  Administered 2018-11-26 – 2018-11-27 (×2): 2 mg via INTRAVENOUS
  Filled 2018-11-26 (×2): qty 1

## 2018-11-26 MED ORDER — TRAMADOL HCL 50 MG PO TABS: 50.0000 mg | ORAL_TABLET | Freq: Three times a day (TID) | ORAL | Status: DC | PRN

## 2018-11-26 MED ORDER — KETOROLAC TROMETHAMINE 30 MG/ML IJ SOLN
30.0000 mg | Freq: Once | INTRAMUSCULAR | Status: AC
  Administered 2018-11-26: 30 mg via INTRAMUSCULAR

## 2018-11-26 MED ORDER — SODIUM CHLORIDE 0.9 % IV BOLUS
1000.0000 mL | Freq: Once | INTRAVENOUS | Status: AC
  Administered 2018-11-27: 1000 mL via INTRAVENOUS

## 2018-11-26 NOTE — Telephone Encounter (Signed)
Patient called stating she was unable to head to the ER right this moment due to child care issues, per natalie, patient most likely will be okay to wait a few hours to get childcare for daughter. Pt agreeable to plan, given risks of delay that could at most lead to loss of life, pt will head to ER today as soon as possible.

## 2018-11-26 NOTE — ED Triage Notes (Signed)
Reports being seen at [facility] and was told hmg is 6.5.  Per patient seen for same previously.  In process to fix bleeding hemorrhoid.  Reports having a heavy cycle as well.  C/o feeling dizzy and weak.

## 2018-11-26 NOTE — ED Provider Notes (Addendum)
[place]    CSN: [id number] Arrival date & time: 11/26/18  2536     History   Chief Complaint Chief Complaint  Patient presents with  . Abdominal Pain    HPI Leah Baker is a 38 y.o. female.   Leah Baker presents with complaints of abdominal cramping and pain with onset of menstruation yesterday. Pain 8/10, unable to sleep. She has had ongoing work up and evaluations for multiple complaints recently, primarily RLQ abdominal pain, lymphadenopathy, rectal bleeding and internal hemorrhoids, and anemia. She has gynecology appointment scheduled 7/7. She had lymph node biopsy which was negative for malignancy. Her Hgb has been stable as of 2 weeks ago, last of 8.8. history of heavy and painful periods, typically managed with tylenol and ibuprofen. It has been recommended that she does not take ibuprofen related to her continued rectal bleeding. Tylenol has not helped with her menstrual cramping. States the pain feels similar in quality as typical for her, but more intense. No nausea vomiting or diarrhea. No dizziness. Feels weak. Has been able to eat and drink. Some urinary frequency but no dysuria or urgency. Back pain which is consistent with menstruation for her. No fevers. Still with rectal bleeding.      Note from PCP 6/24: "This is a 38 year old female with significant internal hemorrhoids and associated lower gastrointestinal bleeding and associated right inguinal and iliac lymphadenopathy with right lower quadrant abdominal pain.  I have been following this patient since April 7 of this year for the lower GI bleeding and associated  anemia.  The hemoglobin is remained in the 8.8-9.0 range and the patient is on iron supplementation.  The patient has now been seen by general surgery Dr. Dema Severin who performed an anoscopy under anesthesia this revealed presence of internal hemorrhoids without ulceration or fissures  No specific treatment of the hemorrhoids were done at  this visit.  General surgery is now recommending a gynecology evaluation.  CT scan of the abdomen did not suggest pelvic abnormalities and the patient denies any vaginal bleeding and has regular periods without excess bleeding and periods.  Note the lymph node biopsy was benign when obtained of the right inguinal lymph node.  The patient denies any shortness of breath nausea vomiting or dysuria.  She will bleed both before and after a bowel movement.  She is on a stool softener and this is resulted in improved bowel movements but the bleeding still continues.    Another issue was costochondritis pain which actually has improved with Voltaren gel topical"      ROS per HPI, negative if not otherwise mentioned.   Past Medical History:  Diagnosis Date  . Acute costochondritis 11/08/2018   anterior chest wall pain  per pt pcp note in epic on 11-10-2018  . Hemorrhoids   . History of lower GI bleeding 08/31/2018   s/p  colonoscopy 09-01-2018,  hemorrhoids  . IDA (iron deficiency anemia)   . Inguinal lymphadenopathy    CT 09-14-2018  right side, followed by pcp  . PONV (postoperative nausea and vomiting)    only happened once  . Wears glasses     Patient Active Problem List   Diagnosis Date Noted  . Inguinal lymphadenopathy 11/24/2018  . Right lower quadrant abdominal pain 11/24/2018  . Costochondritis, acute 11/10/2018  . Liver lesion 09/09/2018  . Hemorrhoid 11/29/2014  . Iron deficiency anemia 05/04/2014  . Mood disorder ([place]) 02/20/2014  . Healthcare maintenance 01/02/2014  . Tears of meniscus and  ACL of left knee 09/08/2013  . Depression 05/14/2011  . H/O: cesarean section 08/28/2010    Past Surgical History:  Procedure Laterality Date  . BIOPSY  09/01/2018   Procedure: BIOPSY;  Surgeon: Otis Brace, MD;  Location: [facility];  Service: Gastroenterology;;  . CESAREAN SECTION  01/15/2002   @[facility]   . CESAREAN SECTION  03/17/2011   Procedure: CESAREAN SECTION;  Surgeon:  Osborne Oman, MD;  Location: [facility];  Service: Gynecology;  Laterality: N/A;  . COLONOSCOPY WITH PROPOFOL N/A 09/01/2018   Procedure: COLONOSCOPY WITH PROPOFOL;  Surgeon: Otis Brace, MD;  Location: [facility];  Service: Gastroenterology;  Laterality: N/A;  . HYSTEROSCOPY W/D&C  06-21-2009  dr constant @[facility]    w/  REMOVAL IUD   . IUD REMOVAL  06/21/2009  . KNEE ARTHROSCOPY WITH ANTERIOR CRUCIATE LIGAMENT (ACL) REPAIR Left 09-08-2013   dr [name]  @[facility]    w/  meniscal repair  . MULTIPLE TOOTH EXTRACTIONS    . RECTAL EXAM UNDER ANESTHESIA N/A 11/18/2018   Procedure: ANORECTAL EXAM UNDER ANESTHESIA;  Surgeon: Ileana Roup, MD;  Location: [facility];  Service: General;  Laterality: N/A;  . TUBAL LIGATION Bilateral 2012    OB History    Gravida  4   Para  2   Term  2   Preterm  0   AB  2   Living  2     SAB  0   TAB  2   Ectopic  0   Multiple  0   Live Births  2            Home Medications    Prior to Admission medications   Medication Sig Start Date End Date Taking? Authorizing Provider  diclofenac sodium (VOLTAREN) 1 % GEL Apply topically as needed.   Yes [provider]  ferrous sulfate 325 (65 FE) MG EC tablet Take 1 tablet (325 mg total) by mouth 2 (two) times a day. Patient taking differently: Take 325 mg by mouth 4 (four) times daily.  10/14/18   Argentina Donovan, PA-C  Multiple Vitamins-Minerals (WOMENS MULTI VITAMIN & MINERAL PO) Take 1 tablet by mouth daily.    [provider]  psyllium (HYDROCIL/METAMUCIL) 95 % PACK Take 1 packet by mouth daily.    [provider]  traMADol (ULTRAM) 50 MG tablet Take 1 tablet (50 mg total) by mouth every 8 (eight) hours as needed for severe pain. 11/26/18   Zigmund Gottron, NP    Family History Family History  Problem Relation Age of Onset  . Cancer Mother   . Kidney failure Mother   . Healthy Father     Social History Social History   Tobacco Use  .  Smoking status: Former Smoker    Years: 5.00    Types: Cigarettes    Quit date: 08/22/2008    Years since quitting: 10.2  . Smokeless tobacco: Never Used  Substance Use Topics  . Alcohol use: Yes    Comment: occasionally  . Drug use: Not Currently    Comment: per pt younger in college marijuana, none since     Allergies   Patient has no known allergies.   Review of Systems Review of Systems   Physical Exam Triage Vital Signs ED Triage Vitals  Enc Vitals Group     BP 11/26/18 1034 123/79     Pulse Rate 11/26/18 1034 71     Resp 11/26/18 1034 18     Temp 11/26/18  1034 98.8 F (37.1 C)     Temp Source 11/26/18 1034 Oral     SpO2 11/26/18 1034 100 %     Weight --      Height --      Head Circumference --      Peak Flow --      Pain Score 11/26/18 1031 8     Pain Loc --      Pain Edu? --      Excl. in [place]? --    No data found.  Updated Vital Signs BP 123/79 (BP Location: Right Arm)   Pulse 71   Temp 98.8 F (37.1 C) (Oral)   Resp 18   LMP 11/25/2018 (Exact Date)   SpO2 100%    Physical Exam Constitutional:      General: She is not in acute distress.    Appearance: She is well-developed.  Cardiovascular:     Rate and Rhythm: Normal rate.     Heart sounds: Normal heart sounds.  Pulmonary:     Effort: Pulmonary effort is normal.  Genitourinary:    Labia:        Right: No rash, tenderness or lesion.        Left: No rash, tenderness or lesion.      Vagina: Normal.     Cervix: Cervical bleeding present.     Uterus: Normal.      Adnexa:        Right: Tenderness present.        Left: No tenderness.       Comments: Small vaginal bleeding noted  Skin:    General: Skin is warm and dry.  Neurological:     Mental Status: She is alert and oriented to person, place, and time.      UC Treatments / Results  Labs (all labs ordered are listed, but only abnormal results are displayed) Labs Reviewed  POCT URINALYSIS DIP (DEVICE) - Abnormal; Notable for the  following components:      Result Value   Hgb urine dipstick MODERATE (*)    Protein, ur 30 (*)    All other components within normal limits  CBC WITH DIFFERENTIAL/PLATELET  POC URINE PREG, ED  POCT PREGNANCY, URINE  CERVICOVAGINAL ANCILLARY ONLY    EKG None  Radiology No results found.  Procedures Procedures (including critical care time)  Medications Ordered in UC Medications  ketorolac (TORADOL) 30 MG/ML injection 30 mg (30 mg Intramuscular Given 11/26/18 1122)  ketorolac (TORADOL) 30 MG/ML injection (has no administration in time range)    Initial Impression / Assessment and Plan / UC Course  I have reviewed the triage vital signs and the nursing notes.  Pertinent labs & imaging results that were available during my care of the patient were reviewed by me and considered in my medical decision making (see chart for details).     IM toradol provided. Tramadol provided for prn use as patient still with rectal bleeding so avoiding nsaids. Continue with tylenol as needed. Negative UA and pregnancy. Persistent RLQ pain, CT negative for acute findings 09/14/18, negative lymph node biopsy. Has follow up appointments in place. CBC pending to ensure hgb is stable. HR and BP stable here today. To continue with iron and multivitamin as prescribed previously. Return precautions provided. Patient verbalized understanding and agreeable to plan. Ambulatory out of clinic without difficulty.       1300: Critical Hgb of 6.5.   Patient called and notified of this. Recommended to go to  the er at this time. Patient verbalized understanding.  Final Clinical Impressions(s) / UC Diagnoses   Final diagnoses:  RLQ abdominal pain  Severe menstrual cramps  Lymphadenopathy     Discharge Instructions     No indication of UTI on urine here today.  I will call you if your hgb has had any significant drop.  Your vaginal swab is pending, takes about 2 days for this result. Will notify you of any  positive findings and if any changes to treatment are needed.   Continue with tylenol for pain, heat pack, tramadol for breakthrough pain. May cause drowsiness. Please do not take if driving or drinking alcohol.  This may also contribute to constipation so be mindful of this. Drink plenty of water.  Please continue to follow with your PCP and your gynecologist for continued evaluation.  If any worsening of symptoms, dizziness, fevers, increased pain please go to the ER.     ED Prescriptions    Medication Sig Dispense Auth. Provider   traMADol (ULTRAM) 50 MG tablet Take 1 tablet (50 mg total) by mouth every 8 (eight) hours as needed for severe pain. 10 tablet Zigmund Gottron, NP     Controlled Substance Prescriptions [place] Controlled Substance Registry consulted? Not Applicable   Zigmund Gottron, NP 11/26/18 1225    Augusto Gamble B, NP 11/26/18 1301

## 2018-11-26 NOTE — Telephone Encounter (Signed)
Patient called requesting medication for pain since she is on her menstrual cycle. Patient states that she has internal bleeding and discussed this with DR.[name]. PCP sent a referral to [facility] and patient has an appointment on July 7.   Please advise [phone]  Thank you Emmit Pomfret

## 2018-11-26 NOTE — Telephone Encounter (Signed)
Patients call taken.  Patient identified by name and date of birth.  Patint bleeeding vaginally and now has started her period.  [facility]    Patient advised to go  To Urgent Care or the Emergency Department to be assessed.  Patient acknowledged understanding of advice.

## 2018-11-26 NOTE — ED Notes (Signed)
HGB 6.5 ; critical called from lab, Augusto Gamble FNP made aware

## 2018-11-26 NOTE — ED Triage Notes (Signed)
Patient presents to Urgent Care with complaints of ongoing abdominal pain since she was told she had internal bleeding related to a hemorrhoid and an inflamed inguinal lymph node in march. Patient reports she is also now on her period and the pain has intensified and goes into her back now. Pt sent here by PCP.

## 2018-11-26 NOTE — H&P (Signed)
History and Physical    Leah Baker AYT:[id number] [name]: May 07, 1981 DOA: 11/26/2018  Referring MD/NP/PA:   PCP: Elsie Stain, MD   Patient coming from:  The patient is coming from home.  At baseline, pt is independent for most of ADL.        Chief Complaint: rectal bleeding, heavy menstrual, lightheadedness  HPI: Leah Baker is a 38 y.o. female with medical history significant of costochondritis, lower GI bleeding, internal hemorrhoid, iron deficiency anemia, LLQ abdominal pain, who presents with rectal bleeding, heavy menstrual, lightheadedness.  Pt has hx of chronic left lower quadrant abdominal pain. She also had lower GI bleeding. Pt had colonoscopy by Dr. Alessandra Bevels on 09/01/2018, which showed internal hemorrhoids, with normal mucosa in the entire examined colon. Per her PCP,  Dr. [name], [facility], pt has significant internal hemorrhoids with associated inguinal lymphadenopathy. Pt has inguinal lymphadenopathy which was benign on biopsy per Dr. Bettina Gavia note on 11/24/18. Pt was given refer to gynecology to rule out any gynecologic pathology and then await follow-up with general surgery to see if direct treatment of the hemorrhoids will be undertaken.  Pt states that she continues to have right lower quadrant abdominal pain, which is constant, 8 out of 10 in severity, sharp, nonradiating.  No nausea, vomiting, diarrhea.  No symptoms of UTI.  This morning she had one episode of bloody stool bowel movement with bright red-colored blood.  Patient states that she has been having heavy menstrual in the past 2 days.  Patient states that she developed lightheadedness, feeling like going to pass out, but did not.  Denies chest pain, shortness of breath, cough, fever or chills.  CT-abd/pelvis showed 09/14/18: 1. No acute CT findings of the abdomen or pelvis to explain left lower quadrant abdominal pain. No evidence of colonoscopy complication. 2. No change in enlarged right inguinal and iliac  lymph nodes, the largest right iliac lymph node measuring 2.3 x 2.0 cm  ED Course: pt was found to have hemoglobin dropped from 8.8 on 11/08/2018 and 6.0, negative pregnancy test, negative pregnancy test pending FOBT, pending COVID-19 test, WBC 8.0, potassium 3.1, creatinine 1.02, BUN 14, temperature normal, heart rate 85-105, oxygen saturation 100% on room air, blood pressure 113/59.  Patient is placed on telemetry bed for observation.  Review of Systems:   General: no fevers, chills, no body weight gain, has poor appetite, has fatigue HEENT: no blurry vision, hearing changes or sore throat Respiratory: no dyspnea, coughing, wheezing CV: no chest pain, no palpitations GI: no nausea, vomiting, has abdominal pain, no diarrhea, constipation GU: no dysuria, burning on urination, increased urinary frequency, hematuria.  OB/GYN: has heavy menstrual. Ext: no leg edema Neuro: no unilateral weakness, numbness, or tingling, no vision change or hearing loss.  Has lightheadedness. Skin: no rash, no skin tear. MSK: No muscle spasm, no deformity, no limitation of range of movement in spin Heme: No easy bruising.  Travel history: No recent long distant travel.  Allergy: No Known Allergies  Past Medical History:  Diagnosis Date  . Acute costochondritis 11/08/2018   anterior chest wall pain  per pt pcp note in epic on 11-10-2018  . Depression   . Hemorrhoids   . History of lower GI bleeding 08/31/2018   s/p  colonoscopy 09-01-2018,  hemorrhoids  . IDA (iron deficiency anemia)   . Inguinal lymphadenopathy    CT 09-14-2018  right side, followed by pcp  . PONV (postoperative nausea and vomiting)    only happened once  .  Wears glasses     Past Surgical History:  Procedure Laterality Date  . BIOPSY  09/01/2018   Procedure: BIOPSY;  Surgeon: Otis Brace, MD;  Location: [facility];  Service: Gastroenterology;;  . CESAREAN SECTION  01/15/2002   @[facility]   . CESAREAN SECTION  03/17/2011   Procedure:  CESAREAN SECTION;  Surgeon: Osborne Oman, MD;  Location: [facility];  Service: Gynecology;  Laterality: N/A;  . COLONOSCOPY WITH PROPOFOL N/A 09/01/2018   Procedure: COLONOSCOPY WITH PROPOFOL;  Surgeon: Otis Brace, MD;  Location: [facility];  Service: Gastroenterology;  Laterality: N/A;  . HYSTEROSCOPY W/D&C  06-21-2009  dr constant @[facility]    w/  REMOVAL IUD   . IUD REMOVAL  06/21/2009  . KNEE ARTHROSCOPY WITH ANTERIOR CRUCIATE LIGAMENT (ACL) REPAIR Left 09-08-2013   dr [name]  @[facility]    w/  meniscal repair  . MULTIPLE TOOTH EXTRACTIONS    . RECTAL EXAM UNDER ANESTHESIA N/A 11/18/2018   Procedure: ANORECTAL EXAM UNDER ANESTHESIA;  Surgeon: Ileana Roup, MD;  Location: [facility];  Service: General;  Laterality: N/A;  . TUBAL LIGATION Bilateral 2012    Social History:  reports that she quit smoking about 10 years ago. Her smoking use included cigarettes. She quit after 5.00 years of use. She has never used smokeless tobacco. She reports current alcohol use. She reports previous drug use.  Family History:  Family History  Problem Relation Age of Onset  . Cancer Mother   . Kidney failure Mother   . Healthy Father      Prior to Admission medications   Medication Sig Start Date End Date Taking? Authorizing Provider  diclofenac sodium (VOLTAREN) 1 % GEL Apply topically as needed.    [provider]  ferrous sulfate 325 (65 FE) MG EC tablet Take 1 tablet (325 mg total) by mouth 2 (two) times a day. Patient taking differently: Take 325 mg by mouth 4 (four) times daily.  10/14/18   Argentina Donovan, PA-C  Multiple Vitamins-Minerals (WOMENS MULTI VITAMIN & MINERAL PO) Take 1 tablet by mouth daily.    [provider]  psyllium (HYDROCIL/METAMUCIL) 95 % PACK Take 1 packet by mouth daily.    [provider]  traMADol (ULTRAM) 50 MG tablet Take 1 tablet (50 mg total) by mouth every 8 (eight) hours as needed for severe pain. 11/26/18   Zigmund Gottron, NP    Physical Exam: Vitals:   11/26/18 2359 11/27/18 0000 11/27/18 0049 11/27/18 0200  BP:  92/61 106/69 113/86  Pulse:  84 75   Resp:  18 18 18   Temp: 98.8 F (37.1 C)  98.5 F (36.9 C) 98.6 F (37 C)  TempSrc:   Oral Oral  SpO2:  100% 100% 100%  Weight:      Height:       General: Not in acute distress HEENT:       Eyes: PERRL, EOMI, no scleral icterus.       ENT: No discharge from the ears and nose, no pharynx injection, no tonsillar enlargement.        Neck: No JVD, no bruit, no mass felt. Heme: No neck lymph node enlargement. Cardiac: S1/S2, RRR, No murmurs, No gallops or rubs. Respiratory: No rales, wheezing, rhonchi or rubs. GI: Soft, nondistended, has tenderness in LLQ, no rebound pain, no organomegaly, BS present. GU: No hematuria Ext: No pitting leg edema bilaterally. 2+DP/PT pulse bilaterally. Musculoskeletal: No joint deformities, No joint redness or warmth, no limitation of ROM  in spin. Skin: No rashes.  Neuro: Alert, oriented X3, cranial nerves II-XII grossly intact, moves all extremities normally.  Psych: Patient is not psychotic, no suicidal or hemocidal ideation.  Labs on Admission: I have personally reviewed following labs and imaging studies  CBC: Recent Labs  Lab 11/26/18 1053 11/26/18 1847  WBC 9.1 8.0  NEUTROABS 5.4  --   HGB 6.5* 6.0*  HCT 21.2* 20.3*  MCV 76.0* 77.8*  PLT 608* 716*   Basic Metabolic Panel: Recent Labs  Lab 11/26/18 1847  NA 134*  K 3.1*  CL 103  CO2 21*  GLUCOSE 102*  BUN 14  CREATININE 1.02*  CALCIUM 8.7*   GFR: Estimated Creatinine Clearance: 67.4 mL/min (A) (by C-G formula based on SCr of 1.02 mg/dL (H)). Liver Function Tests: Recent Labs  Lab 11/26/18 1847  AST 20  ALT 17  ALKPHOS 43  BILITOT 0.2*  PROT 6.8  ALBUMIN 3.8   No results for input(s): LIPASE, AMYLASE in the last 168 hours. No results for input(s): AMMONIA in the last 168 hours. Coagulation Profile: No results for input(s): INR,  PROTIME in the last 168 hours. Cardiac Enzymes: No results for input(s): CKTOTAL, CKMB, CKMBINDEX, TROPONINI in the last 168 hours. BNP (last 3 results) No results for input(s): PROBNP in the last 8760 hours. HbA1C: No results for input(s): HGBA1C in the last 72 hours. CBG: No results for input(s): GLUCAP in the last 168 hours. Lipid Profile: No results for input(s): CHOL, HDL, LDLCALC, TRIG, CHOLHDL, LDLDIRECT in the last 72 hours. Thyroid Function Tests: No results for input(s): TSH, T4TOTAL, FREET4, T3FREE, THYROIDAB in the last 72 hours. Anemia Panel: No results for input(s): VITAMINB12, FOLATE, FERRITIN, TIBC, IRON, RETICCTPCT in the last 72 hours. Urine analysis:    Component Value Date/Time   COLORURINE YELLOW 02/22/2013 1700   APPEARANCEUR HAZY (A) 02/22/2013 1700   LABSPEC >=1.030 11/26/2018 1105   PHURINE 6.0 11/26/2018 1105   GLUCOSEU NEGATIVE 11/26/2018 1105   HGBUR MODERATE (A) 11/26/2018 1105   HGBUR moderate 05/01/2009 0959   BILIRUBINUR NEGATIVE 11/26/2018 1105   BILIRUBINUR NEG 08/23/2010 1459   KETONESUR NEGATIVE 11/26/2018 1105   PROTEINUR 30 (A) 11/26/2018 1105   UROBILINOGEN 0.2 11/26/2018 1105   NITRITE NEGATIVE 11/26/2018 1105   LEUKOCYTESUR NEGATIVE 11/26/2018 1105   Sepsis Labs: @LABRCNTIP (procalcitonin:4,lacticidven:4) ) Recent Results (from the past 240 hour(s))  SARS Coronavirus 2 (CEPHEID - Performed in [facility] hospital lab), Hosp Order     Status: None   Collection Time: 11/26/18 10:45 PM   Specimen: Nasopharyngeal Swab  Result Value Ref Range Status   SARS Coronavirus 2 NEGATIVE NEGATIVE Final    Comment: (NOTE) If result is NEGATIVE SARS-CoV-2 target nucleic acids are NOT DETECTED. The SARS-CoV-2 RNA is generally detectable in upper and lower  respiratory specimens during the acute phase of infection. The lowest  concentration of SARS-CoV-2 viral copies this assay can detect is 250  copies / mL. A negative result does not preclude  SARS-CoV-2 infection  and should not be used as the sole basis for treatment or other  patient management decisions.  A negative result may occur with  improper specimen collection / handling, submission of specimen other  than nasopharyngeal swab, presence of viral mutation(s) within the  areas targeted by this assay, and inadequate number of viral copies  (<250 copies / mL). A negative result must be combined with clinical  observations, patient history, and epidemiological information. If result is POSITIVE SARS-CoV-2 target nucleic acids  are DETECTED. The SARS-CoV-2 RNA is generally detectable in upper and lower  respiratory specimens dur ing the acute phase of infection.  Positive  results are indicative of active infection with SARS-CoV-2.  Clinical  correlation with patient history and other diagnostic information is  necessary to determine patient infection status.  Positive results do  not rule out bacterial infection or co-infection with other viruses. If result is PRESUMPTIVE POSTIVE SARS-CoV-2 nucleic acids MAY BE PRESENT.   A presumptive positive result was obtained on the submitted specimen  and confirmed on repeat testing.  While 2019 novel coronavirus  (SARS-CoV-2) nucleic acids may be present in the submitted sample  additional confirmatory testing may be necessary for epidemiological  and / or clinical management purposes  to differentiate between  SARS-CoV-2 and other Sarbecovirus currently known to infect humans.  If clinically indicated additional testing with an alternate test  methodology [id number]) is advised. The SARS-CoV-2 RNA is generally  detectable in upper and lower respiratory sp ecimens during the acute  phase of infection. The expected result is Negative. Fact Sheet for Patients:  StrictlyIdeas.no Fact Sheet for Healthcare Providers: BankingDealers.co.za This test is not yet approved or cleared by the  [place] FDA and has been authorized for detection and/or diagnosis of SARS-CoV-2 by FDA under an Emergency Use Authorization (EUA).  This EUA will remain in effect (meaning this test can be used) for the duration of the COVID-19 declaration under Section 564(b)(1) of the Act, 21 U.S.C. section 360bbb-3(b)(1), unless the authorization is terminated or revoked sooner. Performed at [facility], [place] [address], [place], [place] [address]      Radiological Exams on Admission: No results found.   EKG:  Not done in ED, will get one.   Assessment/Plan Principal Problem:   Symptomatic anemia Active Problems:   Iron deficiency anemia   Hemorrhoid   Costochondritis, acute   Left lower quadrant abdominal pain   Rectal bleeding   Hypokalemia   Heavy menstrual period   Symptomatic anemia: Hemoglobin dropped from 8.8 on 11/08/18-6.0.  Likely due to combination of rectal bleeding and heavy menstrual period.  Patient is already on iron supplement. -will place on tele bed for obs -IFV: 1L NS and then 100 cc/h -transfuse 2 U of blood -f/u CBC q6h  Iron deficiency anemia: -see above -Continue iron supplement  Rectal bleeding: Likely due to hemorrhoid. Per Dr. Bettina Gavia note on 11/24/18, pt was given refer to gynecology to rule out any gynecologic pathology and then await follow-up with general surgery to see if direct treatment of the hemorrhoids will be undertaken. Pt had colonoscopy by Dr. Alessandra Bevels on 09/01/2018, which showed internal hemorrhoids, with normal mucosa in the entire examined colon. -hold Voltaren gel -start oral protonix -Blood transfusion as above   Costochondritis, acute: -hold Voltaren gel due to active bleeding -prn tylenol for pain  left lower quadrant abdominal pain: this is a chronic issue.  Etiology is not clear.CT-abd/pelvis 09/14/18 showed no acute CT findings of the abdomen or pelvis to explain left lower quadrant abdominal pain. no change in enlarged  right inguinal and iliac lymph nodes, the largest right iliac lymph node. Pt had US-pelvis on 02/22/13 which was negative. - Follow-up gynecology evaluation -prn morphine for pain, Zofran for nausea  Hypokalemia: K= 3.1 on admission. - Repleted - Check Mg level    DVT ppx: SCD Code Status: Full code Family Communication: None at bed side.       Disposition Plan:  Anticipate discharge  back to previous home environment Consults called:  none Admission status: Obs / tele     Date of Service 11/27/2018    [facility]   If 7PM-7AM, please contact night-coverage [email or web address] Password [email or web address] 11/27/2018, 2:44 AM

## 2018-11-26 NOTE — ED Notes (Signed)
No blood draw at this time,   Pt receiving blood transfusion. 

## 2018-11-26 NOTE — Discharge Instructions (Addendum)
No indication of UTI on urine here today.  I will call you if your hgb has had any significant drop.  Your vaginal swab is pending, takes about 2 days for this result. Will notify you of any positive findings and if any changes to treatment are needed.   Continue with tylenol for pain, heat pack, tramadol for breakthrough pain. May cause drowsiness. Please do not take if driving or drinking alcohol.  This may also contribute to constipation so be mindful of this. Drink plenty of water.  Please continue to follow with your PCP and your gynecologist for continued evaluation.  If any worsening of symptoms, dizziness, fevers, increased pain please go to the ER.

## 2018-11-26 NOTE — ED Provider Notes (Signed)
[facility] EMERGENC[facility] DEPARTMENT Provider Note   CSN: [id number] Arrival date & time: 11/26/18  0263     History   Chief Complaint Chief Complaint  Patient presents with  . low hemoglobin    HPI Leah Baker is a 38 y.o. female.     HPI Presents concern of near syncope and lightheadedness. Patient notes that she is feeling progressively worse over the past 3 days.  She has felt similarly lightheaded in the past, has required transfusion. Patient has a history of internal hemorrhoid, fibroids, and is currently menstruating. Within the past weeks patient has had evaluation with general surgery, has seen her physician, and there is ongoing evaluation for repair of both internal hemorrhoid and fibroids. Patient also has known inguinal adenopathy, which is also under evaluation. Patient has received transfusion as recently as 3 months ago, during an admission in which the internal hemorrhoid was initially discovered. Now, with progressive weakness, lightheadedness, near syncope, without focal pain she presents for evaluation.  Patient actually went to urgent care, and was called at home after her lab results there were abnormal.  Past Medical History:  Diagnosis Date  . Acute costochondritis 11/08/2018   anterior chest wall pain  per pt pcp note in epic on 11-10-2018  . Hemorrhoids   . History of lower GI bleeding 08/31/2018   s/p  colonoscopy 09-01-2018,  hemorrhoids  . IDA (iron deficiency anemia)   . Inguinal lymphadenopathy    CT 09-14-2018  right side, followed by pcp  . PONV (postoperative nausea and vomiting)    only happened once  . Wears glasses     Patient Active Problem List   Diagnosis Date Noted  . Symptomatic anemia 11/26/2018  . Rectal bleeding 11/26/2018  . Hypokalemia 11/26/2018  . Inguinal lymphadenopathy 11/24/2018  . Right lower quadrant abdominal pain 11/24/2018  . Costochondritis, acute 11/10/2018  . Liver lesion 09/09/2018   . Hemorrhoid 11/29/2014  . Iron deficiency anemia 05/04/2014  . Mood disorder ([place]) 02/20/2014  . Healthcare maintenance 01/02/2014  . Tears of meniscus and ACL of left knee 09/08/2013  . Depression 05/14/2011  . H/O: cesarean section 08/28/2010    Past Surgical History:  Procedure Laterality Date  . BIOPSY  09/01/2018   Procedure: BIOPSY;  Surgeon: Otis Brace, MD;  Location: [facility];  Service: Gastroenterology;;  . CESAREAN SECTION  01/15/2002   @[facility]   . CESAREAN SECTION  03/17/2011   Procedure: CESAREAN SECTION;  Surgeon: Osborne Oman, MD;  Location: [facility];  Service: Gynecology;  Laterality: N/A;  . COLONOSCOPY WITH PROPOFOL N/A 09/01/2018   Procedure: COLONOSCOPY WITH PROPOFOL;  Surgeon: Otis Brace, MD;  Location: [facility];  Service: Gastroenterology;  Laterality: N/A;  . HYSTEROSCOPY W/D&C  06-21-2009  dr constant @[facility]    w/  REMOVAL IUD   . IUD REMOVAL  06/21/2009  . KNEE ARTHROSCOPY WITH ANTERIOR CRUCIATE LIGAMENT (ACL) REPAIR Left 09-08-2013   dr [name]  @[facility]    w/  meniscal repair  . MULTIPLE TOOTH EXTRACTIONS    . RECTAL EXAM UNDER ANESTHESIA N/A 11/18/2018   Procedure: ANORECTAL EXAM UNDER ANESTHESIA;  Surgeon: Ileana Roup, MD;  Location: [facility];  Service: General;  Laterality: N/A;  . TUBAL LIGATION Bilateral 2012     OB History    Gravida  4   Para  2   Term  2   Preterm  0   AB  2   Living  2  SAB  0   TAB  2   Ectopic  0   Multiple  0   Live Births  2            Home Medications    Prior to Admission medications   Medication Sig Start Date End Date Taking? Authorizing Provider  diclofenac sodium (VOLTAREN) 1 % GEL Apply topically as needed.    [provider]  ferrous sulfate 325 (65 FE) MG EC tablet Take 1 tablet (325 mg total) by mouth 2 (two) times a day. Patient taking differently: Take 325 mg by mouth 4 (four) times daily.  10/14/18   Argentina Donovan, PA-C  Multiple  Vitamins-Minerals (WOMENS MULTI VITAMIN & MINERAL PO) Take 1 tablet by mouth daily.    [provider]  psyllium (HYDROCIL/METAMUCIL) 95 % PACK Take 1 packet by mouth daily.    [provider]  traMADol (ULTRAM) 50 MG tablet Take 1 tablet (50 mg total) by mouth every 8 (eight) hours as needed for severe pain. 11/26/18   Zigmund Gottron, NP    Family History Family History  Problem Relation Age of Onset  . Cancer Mother   . Kidney failure Mother   . Healthy Father     Social History Social History   Tobacco Use  . Smoking status: Former Smoker    Years: 5.00    Types: Cigarettes    Quit date: 08/22/2008    Years since quitting: 10.2  . Smokeless tobacco: Never Used  Substance Use Topics  . Alcohol use: Yes    Comment: occasionally  . Drug use: Not Currently    Comment: per pt younger in college marijuana, none since     Allergies   Patient has no known allergies.   Review of Systems Review of Systems  Constitutional:       Per HPI, otherwise negative  HENT:       Per HPI, otherwise negative  Respiratory:       Per HPI, otherwise negative  Cardiovascular:       Per HPI, otherwise negative  Gastrointestinal: Negative for vomiting.  Endocrine:       Negative aside from HPI  Genitourinary:       Neg aside from HPI   Musculoskeletal:       Per HPI, otherwise negative  Skin: Negative.   Neurological: Positive for weakness and light-headedness. Negative for syncope.  Hematological:       Per HPI     Physical Exam Updated Vital Signs BP 103/88   Pulse 81   Temp 98.6 F (37 C) (Oral)   Resp 18   Ht 5\' 2"  (1.575 m)   Wt 70.3 kg   LMP 11/25/2018 (Exact Date)   SpO2 100%   BMI 28.35 kg/m   Physical Exam Vitals signs and nursing note reviewed.  Constitutional:      General: She is not in acute distress.    Appearance: She is well-developed.  HENT:     Head: Normocephalic and atraumatic.  Eyes:     Conjunctiva/sclera: Conjunctivae  normal.  Cardiovascular:     Rate and Rhythm: Normal rate and regular rhythm.  Pulmonary:     Effort: Pulmonary effort is normal. No respiratory distress.     Breath sounds: Normal breath sounds. No stridor.  Abdominal:     General: There is no distension.  Skin:    General: Skin is warm and dry.  Neurological:     Mental Status: She  is alert and oriented to person, place, and time.     Cranial Nerves: No cranial nerve deficit.      ED Treatments / Results  Labs (all labs ordered are listed, but only abnormal results are displayed) Labs Reviewed  COMPREHENSIVE METABOLIC PANEL - Abnormal; Notable for the following components:      Result Value   Sodium 134 (*)    Potassium 3.1 (*)    CO2 21 (*)    Glucose, Bld 102 (*)    Creatinine, Ser 1.02 (*)    Calcium 8.7 (*)    Total Bilirubin 0.2 (*)    All other components within normal limits  CBC - Abnormal; Notable for the following components:   RBC 2.61 (*)    Hemoglobin 6.0 (*)    HCT 20.3 (*)    MCV 77.8 (*)    MCH 23.0 (*)    MCHC 29.6 (*)    RDW 17.0 (*)    Platelets 546 (*)    All other components within normal limits  SARS CORONAVIRUS 2 (HOSPITAL ORDER, [facility])  HCG, QUANTITATIVE, PREGNANCY  PROTIME-INR  APTT  POC OCCULT BLOOD, ED  I-STAT BETA HCG BLOOD, ED ([facility], [facility], [facility] ONLY)  TYPE AND SCREEN  PREPARE RBC (CROSSMATCH)    EKG    Radiology No results found.  Procedures Procedures (including critical care time)  Medications Ordered in ED Medications  0.9 %  sodium chloride infusion (has no administration in time range)  sodium chloride 0.9 % bolus 1,000 mL (has no administration in time range)  0.9 %  sodium chloride infusion (has no administration in time range)  morphine 2 MG/ML injection 2 mg (has no administration in time range)  traMADol (ULTRAM) tablet 50 mg (has no administration in time range)     Initial Impression / Assessment and Plan / ED Course  I have  reviewed the triage vital signs and the nursing notes.  Pertinent labs & imaging results that were available during my care of the patient were reviewed by me and considered in my medical decision making (see chart for details).    Labs notable for hemoglobin of 6.    Adult female awake alert, presenting with near syncope, weakness, is found to have critically abnormal hemoglobin value. Patient has likely multifactorial bleeding, with consideration of ongoing menstrual cycle, known fibroids contributing as well as internal hemorrhoid. Findings otherwise unremarkable, but with critically abnormal hemoglobin value, concern for symptomatic anemia, lightheadedness, the patient requires transfusion, admission for further monitoring, management.  Final Clinical Impressions(s) / ED Diagnoses   Final diagnoses:  Symptomatic anemia   CRITICAL CARE Performed by: Carmin Muskrat Total critical care time: 35 minutes Critical care time was exclusive of separately billable procedures and treating other patients. Critical care was necessary to treat or prevent imminent or life-threatening deterioration. Critical care was time spent personally by me on the following activities: development of treatment plan with patient and/or surrogate as well as nursing, discussions with consultants, evaluation of patient's response to treatment, examination of patient, obtaining history from patient or surrogate, ordering and performing treatments and interventions, ordering and review of laboratory studies, ordering and review of radiographic studies, pulse oximetry and re-evaluation of patient's condition.    Carmin Muskrat, MD 11/26/18 2322

## 2018-11-27 ENCOUNTER — Encounter (HOSPITAL_COMMUNITY): Payer: Self-pay | Admitting: *Deleted

## 2018-11-27 ENCOUNTER — Other Ambulatory Visit: Payer: Self-pay

## 2018-11-27 DIAGNOSIS — D649 Anemia, unspecified: Secondary | ICD-10-CM | POA: Diagnosis not present

## 2018-11-27 DIAGNOSIS — R590 Localized enlarged lymph nodes: Secondary | ICD-10-CM | POA: Diagnosis not present

## 2018-11-27 DIAGNOSIS — Z1159 Encounter for screening for other viral diseases: Secondary | ICD-10-CM | POA: Diagnosis not present

## 2018-11-27 DIAGNOSIS — K921 Melena: Secondary | ICD-10-CM | POA: Diagnosis not present

## 2018-11-27 DIAGNOSIS — D509 Iron deficiency anemia, unspecified: Secondary | ICD-10-CM | POA: Diagnosis not present

## 2018-11-27 DIAGNOSIS — F329 Major depressive disorder, single episode, unspecified: Secondary | ICD-10-CM | POA: Diagnosis not present

## 2018-11-27 DIAGNOSIS — K644 Residual hemorrhoidal skin tags: Secondary | ICD-10-CM | POA: Diagnosis not present

## 2018-11-27 DIAGNOSIS — R1032 Left lower quadrant pain: Secondary | ICD-10-CM | POA: Diagnosis not present

## 2018-11-27 DIAGNOSIS — Z87891 Personal history of nicotine dependence: Secondary | ICD-10-CM | POA: Diagnosis not present

## 2018-11-27 DIAGNOSIS — N920 Excessive and frequent menstruation with regular cycle: Secondary | ICD-10-CM | POA: Diagnosis not present

## 2018-11-27 DIAGNOSIS — R1031 Right lower quadrant pain: Secondary | ICD-10-CM | POA: Diagnosis not present

## 2018-11-27 DIAGNOSIS — K648 Other hemorrhoids: Secondary | ICD-10-CM | POA: Diagnosis not present

## 2018-11-27 DIAGNOSIS — E876 Hypokalemia: Secondary | ICD-10-CM | POA: Diagnosis not present

## 2018-11-27 LAB — BASIC METABOLIC PANEL
Anion gap: 7 (ref 5–15)
BUN: 8 mg/dL (ref 6–20)
CO2: 22 mmol/L (ref 22–32)
Calcium: 8.2 mg/dL — ABNORMAL LOW (ref 8.9–10.3)
Chloride: 110 mmol/L (ref 98–111)
Creatinine, Ser: 0.78 mg/dL (ref 0.44–1.00)
GFR calc Af Amer: >60 mL/min (ref >60)
GFR calc non Af Amer: >60 mL/min (ref >60)
Glucose, Bld: 94 mg/dL (ref 70–99)
Potassium: 4.4 mmol/L (ref 3.5–5.1)
Sodium: 139 mmol/L (ref 135–145)

## 2018-11-27 LAB — CBC WITH DIFFERENTIAL/PLATELET
Abs Immature Granulocytes: 0.02 10*3/uL (ref 0.00–0.07)
Basophils Absolute: 0 10*3/uL (ref 0.0–0.1)
Basophils Relative: 1 %
Eosinophils Absolute: 0.2 10*3/uL (ref 0.0–0.5)
Eosinophils Relative: 2 %
HCT: 25.2 % — ABNORMAL LOW (ref 36.0–46.0)
Hemoglobin: 8 g/dL — ABNORMAL LOW (ref 12.0–15.0)
Immature Granulocytes: 0 %
Lymphocytes Relative: 24 %
Lymphs Abs: 2 10*3/uL (ref 0.7–4.0)
MCH: 25.1 pg — ABNORMAL LOW (ref 26.0–34.0)
MCHC: 31.7 g/dL (ref 30.0–36.0)
MCV: 79 fL — ABNORMAL LOW (ref 80.0–100.0)
Monocytes Absolute: 0.7 10*3/uL (ref 0.1–1.0)
Monocytes Relative: 8 %
Neutro Abs: 5.5 10*3/uL (ref 1.7–7.7)
Neutrophils Relative %: 65 %
Platelets: 495 10*3/uL — ABNORMAL HIGH (ref 150–400)
RBC: 3.19 MIL/uL — ABNORMAL LOW (ref 3.87–5.11)
RDW: 16.8 % — ABNORMAL HIGH (ref 11.5–15.5)
WBC: 8.4 10*3/uL (ref 4.0–10.5)
nRBC: 0 % (ref 0.0–0.2)

## 2018-11-27 LAB — FERRITIN: Ferritin: 4 ng/mL — ABNORMAL LOW (ref 11–307)

## 2018-11-27 LAB — RETICULOCYTES
Immature Retic Fract: 7.3 % (ref 2.3–15.9)
RBC.: 3.19 MIL/uL — ABNORMAL LOW (ref 3.87–5.11)
Retic Count, Absolute: 28.4 10*3/uL (ref 19.0–186.0)
Retic Ct Pct: 0.9 % (ref 0.4–3.1)

## 2018-11-27 LAB — PREPARE RBC (CROSSMATCH)

## 2018-11-27 LAB — IRON AND TIBC
Iron: 17 ug/dL — ABNORMAL LOW (ref 28–170)
Saturation Ratios: 3 % — ABNORMAL LOW (ref 10.4–31.8)
TIBC: 496 ug/dL — ABNORMAL HIGH (ref 250–450)
UIBC: 479 ug/dL

## 2018-11-27 LAB — PROTIME-INR
INR: 1.1 (ref 0.8–1.2)
Prothrombin Time: 14.3 seconds (ref 11.4–15.2)

## 2018-11-27 LAB — APTT: aPTT: 26 seconds (ref 24–36)

## 2018-11-27 LAB — VITAMIN B12: Vitamin B-12: 337 pg/mL (ref 180–914)

## 2018-11-27 LAB — MAGNESIUM: Magnesium: 1.9 mg/dL (ref 1.7–2.4)

## 2018-11-27 LAB — FOLATE: Folate: 10.8 ng/mL (ref >5.9)

## 2018-11-27 MED ORDER — PSYLLIUM 95 % PO PACK
1.0000 | PACK | Freq: Every day | ORAL | Status: DC
  Administered 2018-11-27: 1 via ORAL
  Filled 2018-11-27: qty 1

## 2018-11-27 MED ORDER — SODIUM CHLORIDE 0.9 % IV SOLN
510.0000 mg | Freq: Once | INTRAVENOUS | Status: AC
  Administered 2018-11-27: 510 mg via INTRAVENOUS
  Filled 2018-11-27: qty 17

## 2018-11-27 MED ORDER — PANTOPRAZOLE SODIUM 40 MG PO TBEC
40.0000 mg | DELAYED_RELEASE_TABLET | Freq: Two times a day (BID) | ORAL | Status: DC
  Administered 2018-11-27: 40 mg via ORAL
  Filled 2018-11-27: qty 1

## 2018-11-27 MED ORDER — MEGESTROL ACETATE 40 MG PO TABS
40.0000 mg | ORAL_TABLET | Freq: Two times a day (BID) | ORAL | Status: DC
  Administered 2018-11-27: 40 mg via ORAL
  Filled 2018-11-27 (×2): qty 1

## 2018-11-27 MED ORDER — POTASSIUM CHLORIDE CRYS ER 20 MEQ PO TBCR
60.0000 meq | EXTENDED_RELEASE_TABLET | Freq: Once | ORAL | Status: AC
  Administered 2018-11-27: 60 meq via ORAL
  Filled 2018-11-27: qty 3

## 2018-11-27 MED ORDER — ACETAMINOPHEN 325 MG PO TABS: 650.0000 mg | ORAL_TABLET | Freq: Four times a day (QID) | ORAL | Status: DC | PRN

## 2018-11-27 MED ORDER — PANTOPRAZOLE SODIUM 40 MG IV SOLR
40.0000 mg | Freq: Two times a day (BID) | INTRAVENOUS | Status: DC
  Administered 2018-11-27: 40 mg via INTRAVENOUS
  Filled 2018-11-27: qty 40

## 2018-11-27 MED ORDER — SODIUM CHLORIDE 0.9% IV SOLUTION: Freq: Once | INTRAVENOUS | Status: AC

## 2018-11-27 MED ORDER — ONDANSETRON HCL 4 MG PO TABS: 4.0000 mg | ORAL_TABLET | Freq: Four times a day (QID) | ORAL | Status: DC | PRN

## 2018-11-27 MED ORDER — DICYCLOMINE HCL 10 MG PO CAPS
10.0000 mg | ORAL_CAPSULE | Freq: Three times a day (TID) | ORAL | Status: DC | PRN
  Administered 2018-11-27: 10 mg via ORAL
  Filled 2018-11-27: qty 1

## 2018-11-27 MED ORDER — ACETAMINOPHEN 650 MG RE SUPP: 650.0000 mg | Freq: Four times a day (QID) | RECTAL | Status: DC | PRN

## 2018-11-27 MED ORDER — FERROUS SULFATE 325 (65 FE) MG PO TABS
325.0000 mg | ORAL_TABLET | Freq: Four times a day (QID) | ORAL | Status: DC
  Administered 2018-11-27 (×2): 325 mg via ORAL
  Filled 2018-11-27 (×2): qty 1

## 2018-11-27 MED ORDER — MEGESTROL ACETATE 40 MG PO TABS: 40.0000 mg | ORAL_TABLET | Freq: Two times a day (BID) | ORAL | 0 refills | Status: DC

## 2018-11-27 MED ORDER — ADULT MULTIVITAMIN W/MINERALS CH
1.0000 | ORAL_TABLET | Freq: Every day | ORAL | Status: DC
  Administered 2018-11-27: 1 via ORAL
  Filled 2018-11-27: qty 1

## 2018-11-27 MED ORDER — ONDANSETRON HCL 4 MG/2ML IJ SOLN: 4.0000 mg | Freq: Four times a day (QID) | INTRAMUSCULAR | Status: DC | PRN

## 2018-11-27 NOTE — Discharge Summary (Signed)
Physician Discharge Summary  Leah Baker BJY:[id number] [name]: Sep 08, 1980 DOA: 11/26/2018  PCP: Elsie Stain, MD  Admit date: 11/26/2018 Discharge date: 11/27/2018  Time spent: 35 minutes  Recommendations for Outpatient Follow-up:  1. PCP Dr. [name] in 1 week, please check CBC at follow-up, consider arranging IV iron infusions once a week or once every 2 weeks via [facility] or cancer center 2. Gynecology at [facility] on 7/7 and transvaginal ultrasound prior to that 3. General surgery Dr. Annye English in 2 weeks for definitive management of hemorrhoids   Discharge Diagnoses:  Severe iron deficiency anemia Internal and external hemorrhoids Menorrhagia   Left lower quadrant abdominal pain   Rectal bleeding   Hypokalemia Inguinal lymphadenopathy   Discharge Condition: Stable  Diet recommendation: Regular  Filed Weights   11/26/18 1845  Weight: 70.3 kg    History of present illness:  Leah Baker is a 38 y.o. female with medical history significant of  internal hemorrhoids with bleeding, menorrhagia, iron deficiency anemia, iron deficiency anemia, LLQ abdominal pain, who presented with heavy menstrual, lightheadedness.  Found to have a hemoglobin of 6 down from baseline of 8 Pt has hx of chronic left lower quadrant abdominal pain. She also had lower GI bleeding. Pt had colonoscopy by Dr. Alessandra Bevels on 09/01/2018, which showed internal hemorrhoids, with normal mucosa in the entire examined colon. Per her PCP,  Dr. [name], Leah Baker, pt has significant internal hemorrhoids with associated inguinal lymphadenopathy. Pt has inguinal lymphadenopathy which was benign on biopsy per Dr. Bettina Gavia note on 11/24/18. Pt was given refer to gynecology to rule out any gynecologic pathology and then await follow-up with general surgery to see if direct treatment of the hemorrhoids will be undertaken.   Hospital Course:   Severe iron deficiency anemia -Due to heavy  menorrhagia and hemorrhoidal bleeding -Baseline hemoglobin is around 8-8.5, admitted with symptomatic anemia with hemoglobin of 6 from the above sources, had a colonoscopy in April by Dr. Alessandra Bevels which showed internal hemorrhoids -She was transfused 2 units of PRBC, given IV iron x1 now -Hemoglobin improved to 8.0 -She has chronic menorrhagia, due to follow-up with gynecology on 7/7, I called GYN on call this weekend, recommended starting Megace 40 mg twice daily to stop her heavy cycles until follow-up with gynecology next week the also recommended a transvaginal ultrasound next week prior to her follow-up which has been set up -Recommend PCP arrange close monitoring of CBC and IV iron infusions either weekly or bimonthly, this can be done through [place] [facility] cancer center or sickle cell clinic. -Continued on oral iron  Internal and external hemorrhoids with intermittent bleeding -Patient reports regular low-grade bleeding, she is seeing Dr. Nadeen Landau with general surgery for this, has an upcoming appointment in couple of weeks after follow-up with GYN for definitive management of her hemorrhoids -She is on stool softeners  left lower quadrant abdominal pain: this is a chronic issue.  Etiology is not clear.CT-abd/pelvis 09/14/18 showed no acute CT findings of the abdomen or pelvis to explain left lower quadrant abdominal pain. no change in enlarged right inguinal and iliac lymph nodes, the largest right iliac lymph node -Biopsy of lymph node was negative for malignancy -Follow-up with gynecology  Hypokalemia: K= 3.1 on admission. - Repleted   Discharge Exam: Vitals:   11/27/18 0600 11/27/18 1306  BP: 109/81 120/86  Pulse:  68  Resp: 20 17  Temp: 98.7 F (37.1 C) 98.3 F (36.8 C)  SpO2: 100% 100%  General: AAOx3 Cardiovascular: S1S2/RRR Respiratory: CTAB  Discharge Instructions   Discharge Instructions    Diet - low sodium heart healthy   Complete by: As  directed    Increase activity slowly   Complete by: As directed      Allergies as of 11/27/2018   No Known Allergies     Medication List    TAKE these medications   acetaminophen 325 MG tablet Commonly known as: TYLENOL Take 2 tablets (650 mg total) by mouth every 6 (six) hours as needed for mild pain (or Fever >/= 101).   ferrous sulfate 325 (65 FE) MG EC tablet Take 1 tablet (325 mg total) by mouth 2 (two) times a day. What changed: when to take this   megestrol 40 MG tablet Commonly known as: MEGACE Take 1 tablet (40 mg total) by mouth 2 (two) times daily.   psyllium 95 % Pack Commonly known as: HYDROCIL/METAMUCIL Take 1 packet by mouth daily.   traMADol 50 MG tablet Commonly known as: ULTRAM Take 1 tablet (50 mg total) by mouth every 8 (eight) hours as needed for severe pain.   WOMENS MULTI VITAMIN & MINERAL PO Take 1 tablet by mouth daily.      No Known Allergies Follow-up Information    Elsie Stain, MD. Schedule an appointment as soon as possible for a visit in 1 week(s).   Specialty: Pulmonary Disease Why: with CBC Contact information: [address] [place] [address] [phone]        GYN Follow up on 12/07/2018.            The results of significant diagnostics from this hospitalization (including imaging, microbiology, ancillary and laboratory) are listed below for reference.    Significant Diagnostic Studies: Dg Chest 2 View  Result Date: 11/08/2018 CLINICAL DATA:  Chest pain. EXAM: CHEST - 2 VIEW COMPARISON:  Two-view chest x-ray 07/08/2009 FINDINGS: The heart size and mediastinal contours are within normal limits. Both lungs are clear. The visualized skeletal structures are unremarkable. IMPRESSION: Normal two-view chest x-ray Electronically Signed   By: San Morelle [name]   On: 11/08/2018 07:35   Ct Angio Chest Pe W And/or Wo Contrast  Result Date: 11/08/2018 CLINICAL DATA:  Chest pain EXAM: CT ANGIOGRAPHY CHEST WITH  CONTRAST TECHNIQUE: Multidetector CT imaging of the chest was performed using the standard protocol during bolus administration of intravenous contrast. Multiplanar CT image reconstructions and MIPs were obtained to evaluate the vascular anatomy. CONTRAST:  74mL OMNIPAQUE IOHEXOL 350 MG/ML SOLN COMPARISON:  Chest radiograph November 08, 2018 FINDINGS: Cardiovascular: There is no demonstrable pulmonary embolus. There is no thoracic aortic aneurysm or dissection. Visualized great vessels appear normal. There is no pericardial effusion or pericardial thickening. Mediastinum/Nodes: Visualized thyroid appears normal. There is no appreciable thoracic adenopathy. No esophageal lesions are evident. Lungs/Pleura: There is mild lower lobe atelectatic change bilaterally. There is no edema or consolidation. There is no demonstrable pleural effusion or pleural thickening. Upper Abdomen: Visualized upper abdominal structures appear unremarkable. Musculoskeletal: There are no blastic or lytic bone lesions. There are no appreciable chest wall lesions. Review of the MIP images confirms the above findings. IMPRESSION: 1. There is no demonstrable pulmonary embolus. No thoracic aortic aneurysm or dissection. 2. Mild lower lobe atelectatic change. No edema or consolidation. No pleural effusion. 3.  No demonstrable thoracic adenopathy. Electronically Signed   By: Lowella Grip III [name]   On: 11/08/2018 11:35    Microbiology: Recent Results (from the past 240 hour(s))  SARS Coronavirus 2 (CEPHEID - Performed in [place] hospital lab), Hosp Order     Status: None   Collection Time: 11/26/18 10:45 PM   Specimen: Nasopharyngeal Swab  Result Value Ref Range Status   SARS Coronavirus 2 NEGATIVE NEGATIVE Final    Comment: (NOTE) If result is NEGATIVE SARS-CoV-2 target nucleic acids are NOT DETECTED. The SARS-CoV-2 RNA is generally detectable in upper and lower  respiratory specimens during the acute phase of infection. The  lowest  concentration of SARS-CoV-2 viral copies this assay can detect is 250  copies / mL. A negative result does not preclude SARS-CoV-2 infection  and should not be used as the sole basis for treatment or other  patient management decisions.  A negative result may occur with  improper specimen collection / handling, submission of specimen other  than nasopharyngeal swab, presence of viral mutation(s) within the  areas targeted by this assay, and inadequate number of viral copies  (<250 copies / mL). A negative result must be combined with clinical  observations, patient history, and epidemiological information. If result is POSITIVE SARS-CoV-2 target nucleic acids are DETECTED. The SARS-CoV-2 RNA is generally detectable in upper and lower  respiratory specimens dur ing the acute phase of infection.  Positive  results are indicative of active infection with SARS-CoV-2.  Clinical  correlation with patient history and other diagnostic information is  necessary to determine patient infection status.  Positive results do  not rule out bacterial infection or co-infection with other viruses. If result is PRESUMPTIVE POSTIVE SARS-CoV-2 nucleic acids MAY BE PRESENT.   A presumptive positive result was obtained on the submitted specimen  and confirmed on repeat testing.  While 2019 novel coronavirus  (SARS-CoV-2) nucleic acids may be present in the submitted sample  additional confirmatory testing may be necessary for epidemiological  and / or clinical management purposes  to differentiate between  SARS-CoV-2 and other Sarbecovirus currently known to infect humans.  If clinically indicated additional testing with an alternate test  methodology [phone]) is advised. The SARS-CoV-2 RNA is generally  detectable in upper and lower respiratory sp ecimens during the acute  phase of infection. The expected result is Negative. Fact Sheet for Patients:   StrictlyIdeas.no Fact Sheet for Healthcare Providers: BankingDealers.co.za This test is not yet approved or cleared by the [place] FDA and has been authorized for detection and/or diagnosis of SARS-CoV-2 by FDA under an Emergency Use Authorization (EUA).  This EUA will remain in effect (meaning this test can be used) for the duration of the COVID-19 declaration under Section 564(b)(1) of the Act, 21 U.S.C. section 360bbb-3(b)(1), unless the authorization is terminated or revoked sooner. Performed at [facility], [place] [address] [place],  [address]      Labs: Basic Metabolic Panel: Recent Labs  Lab 11/26/18 1847 11/27/18 0821  NA 134* 139  K 3.1* 4.4  CL 103 110  CO2 21* 22  GLUCOSE 102* 94  BUN 14 8  CREATININE 1.02* 0.78  CALCIUM 8.7* 8.2*  MG  --  1.9   Liver Function Tests: Recent Labs  Lab 11/26/18 1847  AST 20  ALT 17  ALKPHOS 43  BILITOT 0.2*  PROT 6.8  ALBUMIN 3.8   No results for input(s): LIPASE, AMYLASE in the last 168 hours. No results for input(s): AMMONIA in the last 168 hours. CBC: Recent Labs  Lab 11/26/18 1053 11/26/18 1847 11/27/18 0821  WBC 9.1 8.0 8.4  NEUTROABS 5.4  --  5.5  HGB 6.5* 6.0* 8.0*  HCT 21.2* 20.3* 25.2*  MCV 76.0* 77.8* 79.0*  PLT 608* 546* 495*   Cardiac Enzymes: No results for input(s): CKTOTAL, CKMB, CKMBINDEX, TROPONINI in the last 168 hours. BNP: BNP (last 3 results) No results for input(s): BNP in the last 8760 hours.  ProBNP (last 3 results) No results for input(s): PROBNP in the last 8760 hours.  CBG: No results for input(s): GLUCAP in the last 168 hours.     Signed:  Domenic Polite MD.  [facility] 11/27/2018, 1:32 PM

## 2018-11-27 NOTE — Progress Notes (Signed)
Patient arrived to unit [place] bed 27 from emergency department. Assisted patient to bed by nursing staff.Oriented patient to nurse call bell and unit.Educated patient to call for help before getting out of bed.Patient verbalized understanding.No acute distress noted at present time. Will continue to monitor patient.

## 2018-11-27 NOTE — Progress Notes (Signed)
Nsg Discharge Note  Admit Date:  11/26/2018 Discharge date: 11/27/2018   Leah Baker to be D/C'd Home   per MD order.  AVS completed.  Copy for chart, and copy for patient signed, and dated. Patient/caregiver able to verbalize understanding.  Discharge Medication: Allergies as of 11/27/2018   No Known Allergies     Medication List    TAKE these medications   acetaminophen 325 MG tablet Commonly known as: TYLENOL Take 2 tablets (650 mg total) by mouth every 6 (six) hours as needed for mild pain (or Fever >/= 101).   ferrous sulfate 325 (65 FE) MG EC tablet Take 1 tablet (325 mg total) by mouth 2 (two) times a day. What changed: when to take this   megestrol 40 MG tablet Commonly known as: MEGACE Take 1 tablet (40 mg total) by mouth 2 (two) times daily.   psyllium 95 % Pack Commonly known as: HYDROCIL/METAMUCIL Take 1 packet by mouth daily.   traMADol 50 MG tablet Commonly known as: ULTRAM Take 1 tablet (50 mg total) by mouth every 8 (eight) hours as needed for severe pain.   WOMENS MULTI VITAMIN & MINERAL PO Take 1 tablet by mouth daily.       Discharge Assessment: Vitals:   11/27/18 0600 11/27/18 1306  BP: 109/81 120/86  Pulse:  68  Resp: 20 17  Temp: 98.7 F (37.1 C) 98.3 F (36.8 C)  SpO2: 100% 100%   Skin clean, dry and intact without evidence of skin break down, no evidence of skin tears noted. IV catheter discontinued intact. Site without signs and symptoms of complications - no redness or edema noted at insertion site, patient denies c/o pain - only slight tenderness at site.  Dressing with slight pressure applied.  D/c Instructions-Education: Discharge instructions given to patient/family with verbalized understanding. D/c education completed with patient/family including follow up instructions, medication list, d/c activities limitations if indicated, with other d/c instructions as indicated by MD - patient able to verbalize understanding, all  questions fully answered. Patient instructed to return to ED, call 911, or call MD for any changes in condition.  Patient escorted via [place], and D/C home via private auto.  Hassan Rowan, RN 11/27/2018 2:36 PM

## 2018-11-27 NOTE — Progress Notes (Signed)
Blood bank will not release second unit of blood that is ordered.Per blood bank staff it needs to be reordered .Text paged Dr. Blaine Hamper.

## 2018-11-28 LAB — TYPE AND SCREEN
ABO/RH(D): O POS
Antibody Screen: NEGATIVE
Unit division: 0
Unit division: 0

## 2018-11-28 LAB — BPAM RBC
Blood Product Expiration Date: 202007212359
Blood Product Expiration Date: 202007222359
ISSUE DATE / TIME: 202006262319
ISSUE DATE / TIME: 202006270249
Unit Type and Rh: 5100
Unit Type and Rh: 5100

## 2018-11-30 LAB — CERVICOVAGINAL ANCILLARY ONLY
Bacterial vaginitis: POSITIVE — AB
Candida vaginitis: NEGATIVE
Chlamydia: NEGATIVE
Neisseria Gonorrhea: NEGATIVE
Trichomonas: NEGATIVE

## 2018-12-01 ENCOUNTER — Telehealth (HOSPITAL_COMMUNITY): Payer: Self-pay | Admitting: Emergency Medicine

## 2018-12-01 MED ORDER — METRONIDAZOLE 500 MG PO TABS: 500.0000 mg | ORAL_TABLET | Freq: Two times a day (BID) | ORAL | 0 refills | Status: AC

## 2018-12-01 NOTE — Telephone Encounter (Signed)
Bacterial vaginosis is positive. This was not treated at the urgent care visit.  Flagyl 500 mg BID x 7 days #14 no refills sent to patients pharmacy of choice.    Attempted to reach patient. No answer at this time. Voicemail left.

## 2018-12-02 ENCOUNTER — Telehealth (HOSPITAL_COMMUNITY): Payer: Self-pay | Admitting: Emergency Medicine

## 2018-12-02 NOTE — Telephone Encounter (Signed)
Patient contacted and made aware of all results, all questions answered.   

## 2018-12-06 ENCOUNTER — Ambulatory Visit (HOSPITAL_COMMUNITY): Admission: RE | Admit: 2018-12-06 | Payer: Medicaid Other | Source: Ambulatory Visit

## 2018-12-07 ENCOUNTER — Other Ambulatory Visit (HOSPITAL_COMMUNITY)
Admission: RE | Admit: 2018-12-07 | Discharge: 2018-12-07 | Disposition: A | Discharge status: Home / Self Care | Payer: Medicaid Other | Source: Ambulatory Visit | Attending: Obstetrics & Gynecology | Admitting: Obstetrics & Gynecology

## 2018-12-07 ENCOUNTER — Encounter: Payer: Self-pay | Admitting: Obstetrics & Gynecology

## 2018-12-07 ENCOUNTER — Other Ambulatory Visit: Payer: Self-pay

## 2018-12-07 ENCOUNTER — Ambulatory Visit: Payer: Medicaid Other | Admitting: Obstetrics & Gynecology

## 2018-12-07 VITALS — BP 109/76 | HR 80 | Temp 99.0°F | Ht 63.0 in | Wt 155.6 lb

## 2018-12-07 DIAGNOSIS — Z0000 Encounter for general adult medical examination without abnormal findings: Secondary | ICD-10-CM | POA: Diagnosis not present

## 2018-12-07 DIAGNOSIS — Z01419 Encounter for gynecological examination (general) (routine) without abnormal findings: Secondary | ICD-10-CM

## 2018-12-07 DIAGNOSIS — N920 Excessive and frequent menstruation with regular cycle: Secondary | ICD-10-CM

## 2018-12-07 NOTE — Progress Notes (Signed)
Pt presents for annual and STD blood work. Pap and MGM due.  GC/CT neg last week at Urgent Care Pt c/o menorrhagia since March no relief with Megace. Recent hospitalization for the bleeding per pt. U/S scheduled 12/16/2018 Has painful enlarge lymph node in groin area. Has internal hemorrhoid. Surgeon suggests Gyn visit prior to removal.

## 2018-12-07 NOTE — Progress Notes (Signed)
Patient ID: Leah Baker, female   [name]: 1980/10/23, 38 y.o.   MRN: [id number]  Chief Complaint  Patient presents with  . Gynecologic Exam  heavy period  HPI Leah Baker is a 38 y.o. female.  [id number] Patient's last menstrual period was 11/22/2018. She has regular menses with heavy flow for 5 days, with a recent hospitalization for anemia s/p transfusion 2 units PRBC. She is referred for evaluation and to have routine gyn care. Using megace now and the bleeding has stopped. S/P BTL 03/2011. HPI  Past Medical History:  Diagnosis Date  . Acute costochondritis 11/08/2018   anterior chest wall pain  per pt pcp note in epic on 11-10-2018  . Depression   . Hemorrhoids   . History of lower GI bleeding 08/31/2018   s/p  colonoscopy 09-01-2018,  hemorrhoids  . IDA (iron deficiency anemia)   . Inguinal lymphadenopathy    CT 09-14-2018  right side, followed by pcp  . PONV (postoperative nausea and vomiting)    only happened once  . Wears glasses     Past Surgical History:  Procedure Laterality Date  . BIOPSY  09/01/2018   Procedure: BIOPSY;  Surgeon: Otis Brace, MD;  Location: [facility];  Service: Gastroenterology;;  . CESAREAN SECTION  01/15/2002   @[facility]   . CESAREAN SECTION  03/17/2011   Procedure: CESAREAN SECTION;  Surgeon: Osborne Oman, MD;  Location: [facility];  Service: Gynecology;  Laterality: N/A;  . COLONOSCOPY WITH PROPOFOL N/A 09/01/2018   Procedure: COLONOSCOPY WITH PROPOFOL;  Surgeon: Otis Brace, MD;  Location: [facility];  Service: Gastroenterology;  Laterality: N/A;  . HYSTEROSCOPY W/D&C  06-21-2009  dr constant @[facility]    w/  REMOVAL IUD   . IUD REMOVAL  06/21/2009  . KNEE ARTHROSCOPY WITH ANTERIOR CRUCIATE LIGAMENT (ACL) REPAIR Left 09-08-2013   dr [name]  @[facility]    w/  meniscal repair  . MULTIPLE TOOTH EXTRACTIONS    . RECTAL EXAM UNDER ANESTHESIA N/A 11/18/2018   Procedure: ANORECTAL EXAM UNDER ANESTHESIA;  Surgeon: Ileana Roup, MD;  Location:  [facility];  Service: General;  Laterality: N/A;  . TUBAL LIGATION Bilateral 2012    Family History  Problem Relation Age of Onset  . Cancer Mother   . Kidney failure Mother   . Healthy Father     Social History Social History   Tobacco Use  . Smoking status: Former Smoker    Years: 5.00    Types: Cigarettes    Quit date: 08/22/2008    Years since quitting: 10.2  . Smokeless tobacco: Never Used  Substance Use Topics  . Alcohol use: Yes    Comment: occasionally  . Drug use: Not Currently    Comment: per pt younger in college marijuana, none since    No Known Allergies  Current Outpatient Medications  Medication Sig Dispense Refill  . ferrous sulfate 325 (65 FE) MG EC tablet Take 1 tablet (325 mg total) by mouth 2 (two) times a day. (Patient taking differently: Take 325 mg by mouth 4 (four) times daily. ) 90 tablet 3  . megestrol (MEGACE) 40 MG tablet Take 1 tablet (40 mg total) by mouth 2 (two) times daily. 30 tablet 0  . metroNIDAZOLE (FLAGYL) 500 MG tablet Take 1 tablet (500 mg total) by mouth 2 (two) times daily for 7 days. 14 tablet 0  . Multiple Vitamins-Minerals (WOMENS MULTI VITAMIN & MINERAL PO) Take 1 tablet by mouth daily.    . psyllium (HYDROCIL/METAMUCIL)  95 % PACK Take 1 packet by mouth daily.    Marland Kitchen acetaminophen (TYLENOL) 325 MG tablet Take 2 tablets (650 mg total) by mouth every 6 (six) hours as needed for mild pain (or Fever >/= 101). (Patient not taking: Reported on 12/07/2018)    . traMADol (ULTRAM) 50 MG tablet Take 1 tablet (50 mg total) by mouth every 8 (eight) hours as needed for severe pain. (Patient not taking: Reported on 12/07/2018) 10 tablet 0   No current facility-administered medications for this visit.     Review of Systems Review of Systems  Constitutional: Negative.   Respiratory: Negative.   Endocrine: Negative.   Genitourinary: Positive for menstrual problem and pelvic pain (cramps). Negative for vaginal bleeding and vaginal  discharge.    Blood pressure 109/76, pulse 80, temperature 99 F (37.2 C), height 5\' 3"  (1.6 m), weight 155 lb 9.6 oz (70.6 kg), last menstrual period 11/22/2018.  Physical Exam Physical Exam Constitutional:      Appearance: She is not ill-appearing.  HENT:     Head: Normocephalic and atraumatic.  Eyes:     Pupils: Pupils are equal, round, and reactive to light.  Neck:     Musculoskeletal: Normal range of motion.  Cardiovascular:     Rate and Rhythm: Normal rate and regular rhythm.     Pulses: Normal pulses.  Pulmonary:     Effort: Pulmonary effort is normal.     Breath sounds: Normal breath sounds.  Abdominal:     General: Abdomen is flat. There is no distension.     Palpations: Abdomen is soft. There is no mass.  Genitourinary:    General: Normal vulva.     Vagina: No vaginal discharge.     Comments: Pelvic exam: normal external genitalia, vulva, vagina, cervix, uterus and adnexa.  Neurological:     Mental Status: She is alert.   Breasts: breasts appear normal, no suspicious masses, no skin or nipple changes or axillary nodes.   Data Reviewed CLINICAL DATA:  Heavy vaginal bleeding for 3 weeks. History of IUD removal.  EXAM: TRANSABDOMINAL AND TRANSVAGINAL ULTRASOUND OF PELVIS  TECHNIQUE: Both transabdominal and transvaginal ultrasound examinations of the pelvis were performed. Transabdominal technique was performed for global imaging of the pelvis including uterus, ovaries, adnexal regions, and pelvic cul-de-sac. It was necessary to proceed with endovaginal exam following the transabdominal exam to visualize the endometrium and ovaries to better advantage.  COMPARISON:  Pelvic ultrasound 03/05/2009.  FINDINGS: Uterus  Measurements: 9.5 x 3.6 x 5.5 cm. No fibroids or other mass visualized.  Endometrium  Thickness: 11 mm. No intrauterine device demonstrated. No focal abnormality visualized.  Right ovary  Measurements: 3.2 x 2.7 x 2.4 cm.  Normal appearance/no adnexal mass.  Left ovary  Measurements: 2.6 x 2.5 x 2.3 cm. Normal appearance/no adnexal mass.  Other findings  No free fluid.  IMPRESSION: Normal pelvic ultrasound. No significant endometrial thickening or adnexal pathology.   Electronically Signed   By: Camie Patience   On: 02/22/2013 19:09  Images reviewed by me Assessment Menorrhagia and anemia Internal hemorrhoids S/p BTL, nl [place] in 2014   Plan Repeat pelvic US which is scheduled 4 week f/u, continue megace and iron Consider Mirena vs ablation    Emeterio Reeve 12/07/2018, 2:50 PM

## 2018-12-08 ENCOUNTER — Ambulatory Visit: Payer: Medicaid Other | Attending: Critical Care Medicine | Admitting: Critical Care Medicine

## 2018-12-08 ENCOUNTER — Encounter: Payer: Self-pay | Admitting: Critical Care Medicine

## 2018-12-08 ENCOUNTER — Telehealth: Payer: Self-pay | Admitting: *Deleted

## 2018-12-08 VITALS — BP 111/72 | HR 90 | Temp 99.6°F | Resp 18 | Ht 62.0 in | Wt 153.0 lb

## 2018-12-08 DIAGNOSIS — Z841 Family history of disorders of kidney and ureter: Secondary | ICD-10-CM | POA: Insufficient documentation

## 2018-12-08 DIAGNOSIS — K649 Unspecified hemorrhoids: Secondary | ICD-10-CM | POA: Diagnosis not present

## 2018-12-08 DIAGNOSIS — R590 Localized enlarged lymph nodes: Secondary | ICD-10-CM | POA: Insufficient documentation

## 2018-12-08 DIAGNOSIS — M940 Chondrocostal junction syndrome [Tietze]: Secondary | ICD-10-CM

## 2018-12-08 DIAGNOSIS — K648 Other hemorrhoids: Secondary | ICD-10-CM | POA: Insufficient documentation

## 2018-12-08 DIAGNOSIS — N920 Excessive and frequent menstruation with regular cycle: Secondary | ICD-10-CM | POA: Insufficient documentation

## 2018-12-08 DIAGNOSIS — Z79899 Other long term (current) drug therapy: Secondary | ICD-10-CM | POA: Insufficient documentation

## 2018-12-08 DIAGNOSIS — D500 Iron deficiency anemia secondary to blood loss (chronic): Secondary | ICD-10-CM | POA: Diagnosis not present

## 2018-12-08 DIAGNOSIS — E876 Hypokalemia: Secondary | ICD-10-CM | POA: Insufficient documentation

## 2018-12-08 DIAGNOSIS — R1032 Left lower quadrant pain: Secondary | ICD-10-CM | POA: Diagnosis not present

## 2018-12-08 DIAGNOSIS — K625 Hemorrhage of anus and rectum: Secondary | ICD-10-CM

## 2018-12-08 DIAGNOSIS — D649 Anemia, unspecified: Secondary | ICD-10-CM

## 2018-12-08 DIAGNOSIS — Z87891 Personal history of nicotine dependence: Secondary | ICD-10-CM | POA: Diagnosis not present

## 2018-12-08 LAB — HIV ANTIBODY (ROUTINE TESTING W REFLEX): HIV Screen 4th Generation wRfx: NONREACTIVE

## 2018-12-08 LAB — HEPATITIS C ANTIBODY: Hep C Virus Ab: <0.1 s/co ratio (ref 0.0–0.9)

## 2018-12-08 LAB — HEPATITIS B SURFACE ANTIGEN: Hepatitis B Surface Ag: NEGATIVE

## 2018-12-08 LAB — RPR: RPR Ser Ql: NONREACTIVE

## 2018-12-08 MED ORDER — FERAHEME 510 MG/17ML IV SOLN: 510.0000 mg | INTRAVENOUS | 0 refills | Status: DC

## 2018-12-08 MED ORDER — TRAMADOL HCL 50 MG PO TABS: 100.0000 mg | ORAL_TABLET | Freq: Three times a day (TID) | ORAL | 0 refills | Status: DC | PRN

## 2018-12-08 NOTE — Assessment & Plan Note (Signed)
Significant menorrhagia which is one source of the iron deficiency anemia  Plan here will be for the patient obtain a transabdominal and vaginal ultrasound within the week and continue Megace 40 mg twice daily along with follow-up with gynecology

## 2018-12-08 NOTE — Assessment & Plan Note (Signed)
Right inguinal lymphadenopathy is still persisting but has not progressed in size status post needle biopsy which was negative for malignancy

## 2018-12-08 NOTE — Assessment & Plan Note (Signed)
The patient has had repeated bouts of symptomatic anemia with hemoglobin dropping as low as 5  Source of bleeding appears to be combination of menorrhagia and internal hemorrhoid bleeding

## 2018-12-08 NOTE — Assessment & Plan Note (Signed)
Severe iron deficiency anemia This is on the basis of frequent menstrual periods that are prolonged and excessive along with internal hemorrhoidal bleeding  Plan will be for the patient to continue oral iron and as well begin a course of intravenous iron 510 mg every 2 weeks at the patient care center we will set this up starting this week  Further work-up per gynecology and general surgery

## 2018-12-08 NOTE — Assessment & Plan Note (Signed)
The acute costochondritis pain appears to have now resolved

## 2018-12-08 NOTE — Patient Instructions (Addendum)
Keep your upcoming appointments for your pelvic ultrasound and surgical visits  We will schedule an iron infusion at the patient care center every 2 weeks.  They will call you with an appointment time for the first infusion for this week  Tramadol was refilled and sent to your [facility] pharmacy two 50 mg tablets to be taken every 8 hours as needed for severe pain  Labs today will include a complete blood count  Return with Dr. [name] in 6 weeks

## 2018-12-08 NOTE — Assessment & Plan Note (Signed)
Ongoing internal hemorrhoids with ongoing lower GI bleeding  Follow-up with general surgery after further evaluation is completed by the gynecologist

## 2018-12-08 NOTE — Progress Notes (Addendum)
Subjective:    Patient ID: Leah Baker, female    [name]: 1980/11/03, 38 y.o.   MRN: [id number]  This is a 38 year old female seen back in follow-up for lower GI bleeding, blood loss anemia, internal hemorrhoids, and iliac and right inguinal lymphadenopathy, and now a new problem regarding menorrhagia.  Since last visit the patient required hospitalization with a hemoglobin of 5 and was determined to have excess uterine bleeding in addition to the ongoing hemorrhoidal bleeding from internal hemorrhoids.  The patient continues to have right lower quadrant abdominal pain and right inguinal lymphadenopathy and right iliac lymphadenopathy.  Note previous abdominal CT scans have not revealed pelvic pathology.  The patient has since seen gynecology Dr. Roselie Awkward upon request of the general surgeon.  It appears that the vaginal and abdominal ultrasound will be obtained and is scheduled next week.  Previous ultrasounds of the abdomen and pelvis in 2014 was normal.  There is been a prior history of heavy vaginal bleeding and this is a compounded the iron deficiency anemia.  1 recommendation of the discharge summary was for the patient received intravenous iron.  In the hospitalization she received 2 units of packed cells and 1 dose of 510 mg Feraheme.  Currently the patient shortness of breath is better but her abdominal pain continues to persist.  She has had no additional chest pains.  Note she cannot take nonsteroidal anti-inflammatories due to the bleeding.  Note also the patient is now on Megace in addition to iron supplementation and this has helped to slow down the menstrual bleeding.  The patient also had hypokalemia which needs to be followed up as well   Past Medical History:  Diagnosis Date  . Acute costochondritis 11/08/2018   anterior chest wall pain  per pt pcp note in epic on 11-10-2018  . Costochondritis, acute 11/10/2018  . Depression   . Hemorrhoids   . History of lower GI bleeding  08/31/2018   s/p  colonoscopy 09-01-2018,  hemorrhoids  . IDA (iron deficiency anemia)   . Inguinal lymphadenopathy    CT 09-14-2018  right side, followed by pcp  . PONV (postoperative nausea and vomiting)    only happened once  . Wears glasses      Family History  Problem Relation Age of Onset  . Cancer Mother   . Kidney failure Mother   . Healthy Father      Social History   Socioeconomic History  . Marital status: Single    Spouse name: Not on file  . Number of children: Not on file  . Years of education: Not on file  . Highest education level: Not on file  Occupational History  . Not on file  Social Needs  . Financial resource strain: Not on file  . Food insecurity    Worry: Not on file    Inability: Not on file  . Transportation needs    Medical: Not on file    Non-medical: Not on file  Tobacco Use  . Smoking status: Former Smoker    Years: 5.00    Types: Cigarettes    Quit date: 08/22/2008    Years since quitting: 10.3  . Smokeless tobacco: Never Used  Substance and Sexual Activity  . Alcohol use: Yes    Comment: occasionally  . Drug use: Not Currently    Comment: per pt younger in college marijuana, none since  . Sexual activity: Yes    Birth control/protection: Surgical  Lifestyle  . Physical activity  Days per week: Not on file    Minutes per session: Not on file  . Stress: Not on file  Relationships  . Social Herbalist on phone: Not on file    Gets together: Not on file    Attends religious service: Not on file    Active member of club or organization: Not on file    Attends meetings of clubs or organizations: Not on file    Relationship status: Not on file  . Intimate partner violence    Fear of current or ex partner: Not on file    Emotionally abused: Not on file    Physically abused: Not on file    Forced sexual activity: Not on file  Other Topics Concern  . Not on file  Social History Narrative  . Not on file     No  Known Allergies   Outpatient Medications Prior to Visit  Medication Sig Dispense Refill  . acetaminophen (TYLENOL) 325 MG tablet Take 2 tablets (650 mg total) by mouth every 6 (six) hours as needed for mild pain (or Fever >/= 101).    . ferrous sulfate 325 (65 FE) MG EC tablet Take 1 tablet (325 mg total) by mouth 2 (two) times a day. (Patient taking differently: Take 325 mg by mouth 4 (four) times daily. ) 90 tablet 3  . megestrol (MEGACE) 40 MG tablet Take 1 tablet (40 mg total) by mouth 2 (two) times daily. 30 tablet 0  . metroNIDAZOLE (FLAGYL) 500 MG tablet Take 1 tablet (500 mg total) by mouth 2 (two) times daily for 7 days. 14 tablet 0  . Multiple Vitamins-Minerals (WOMENS MULTI VITAMIN & MINERAL PO) Take 1 tablet by mouth daily.    . psyllium (HYDROCIL/METAMUCIL) 95 % PACK Take 1 packet by mouth daily.    . traMADol (ULTRAM) 50 MG tablet Take 1 tablet (50 mg total) by mouth every 8 (eight) hours as needed for severe pain. (Patient not taking: Reported on 12/07/2018) 10 tablet 0   No facility-administered medications prior to visit.      Constitutional:   No  weight loss, night sweats,  Fevers, chills, fatigue, lassitude. HEENT:   No headaches,  Difficulty swallowing,  Tooth/dental problems,  Sore throat,                No sneezing, itching, ear ache, nasal congestion, post nasal drip,   CV:   chest pain,  Orthopnea, PND, swelling in lower extremities, anasarca, dizziness, palpitations  GI   heartburn, indigestion, abdominal pain, nausea, vomiting, diarrhea, change in bowel habits, loss of appetite,  BLood in stool  Resp: No shortness of breath with exertion or at rest.  No excess mucus, no productive cough,  No non-productive cough,  No coughing up of blood.  No change in color of mucus.  No wheezing.  No chest wall deformity  Skin: no rash or lesions. GYN: heavy prolonged menses  GU: no dysuria, change in color of urine, no urgency or frequency.  No flank pain.  MS:  No joint  pain or swelling.  No decreased range of motion.  No back pain.  Psych:  No change in mood or affect. No depression or anxiety.  No memory loss.     Objective:   Physical Exam Vitals:   12/08/18 0859  BP: 111/72  Pulse: 90  Resp: 18  Temp: 99.6 F (37.6 C)  TempSrc: Oral  SpO2: 100%  Weight: 153 lb (69.4 kg)  Height: 5\' 2"  (1.575 m)    Gen: Pleasant, well-nourished, in no distress,  normal affect  ENT: No lesions,  mouth clear,  oropharynx clear, no postnasal drip  Neck: No JVD, no TMG, no carotid bruits  Lungs: No use of accessory muscles, no dullness to percussion, clear without rales or rhonchi  Cardiovascular: RRR, heart sounds normal, no murmur or gallops, no peripheral edema Tender parasternal area c/w costochondritis  Abdomen: soft tender RLQ  R inguinal LAN  Musculoskeletal: No deformities, no cyanosis or clubbing  Neuro: alert, non focal  Skin: Warm, no lesions or rashes CBC Latest Ref Rng & Units 11/27/2018 11/26/2018 11/26/2018  WBC 4.0 - 10.5 K/uL 8.4 8.0 9.1  Hemoglobin 12.0 - 15.0 g/dL 8.0(L) 6.0(LL) 6.5(LL)  Hematocrit 36.0 - 46.0 % 25.2(L) 20.3(L) 21.2(L)  Platelets 150 - 400 K/uL 495(H) 546(H) 608(H)   BMP Latest Ref Rng & Units 11/27/2018 11/26/2018 11/08/2018  Glucose 70 - 99 mg/dL 94 102(H) 100(H)  BUN 6 - 20 mg/dL 8 14 10   Creatinine 0.44 - 1.00 mg/dL 0.78 1.02(H) 0.83  BUN/Creat Ratio 9 - 23 - - -  Sodium 135 - 145 mmol/L 139 134(L) 139  Potassium 3.5 - 5.1 mmol/L 4.4 3.1(L) 4.1  Chloride 98 - 111 mmol/L 110 103 109  CO2 22 - 32 mmol/L 22 21(L) 21(L)  Calcium 8.9 - 10.3 mg/dL 8.2(L) 8.7(L) 9.2      Assessment & Plan:  I personally reviewed all images and lab data in the [facility] system as well as any outside material available during this office visit and agree with the  radiology impressions.   Iron deficiency anemia Severe iron deficiency anemia This is on the basis of frequent menstrual periods that are prolonged and excessive along with  internal hemorrhoidal bleeding  Plan will be for the patient to continue oral iron and as well begin a course of intravenous iron 510 mg every 2 weeks at the patient care center we will set this up starting this week  Further work-up per gynecology and general surgery  Heavy menstrual period Significant menorrhagia which is one source of the iron deficiency anemia  Plan here will be for the patient obtain a transabdominal and vaginal ultrasound within the week and continue Megace 40 mg twice daily along with follow-up with gynecology  Left lower quadrant abdominal pain Right lower quadrant iliac lymphadenopathy along with right inguinal lymphadenopathy likely related to either the internal hemorrhoids or potential pelvic process   We will also prescribe tramadol 100 mg every 8 hours as needed pain  We gave 60 tablets and check the [place] controlled substance database which only showed prescriptions for tramadol recently  Symptomatic anemia The patient has had repeated bouts of symptomatic anemia with hemoglobin dropping as low as 5  Source of bleeding appears to be combination of menorrhagia and internal hemorrhoid bleeding  Costochondritis, acute The acute costochondritis pain appears to have now resolved  Hemorrhoid Ongoing internal hemorrhoids with ongoing lower GI bleeding  Follow-up with general surgery after further evaluation is completed by the gynecologist  Inguinal lymphadenopathy Right inguinal lymphadenopathy is still persisting but has not progressed in size status post needle biopsy which was negative for malignancy   Mkenzie was seen today for hospitalization follow-up.  Diagnoses and all orders for this visit:  Iron deficiency anemia due to chronic blood loss -     CBC with Differential/Platelet; Future -     CBC with Differential/Platelet  Inguinal lymphadenopathy  Menorrhagia with  regular cycle  Symptomatic anemia  Left lower quadrant  abdominal pain  Hemorrhoids, unspecified hemorrhoid type  Rectal bleeding  Hypokalemia -     Basic metabolic panel; Future -     Basic metabolic panel  Costochondritis, acute  Other orders -     traMADol (ULTRAM) 50 MG tablet; Take 2 tablets (100 mg total) by mouth every 8 (eight) hours as needed for severe pain. -     ferumoxytol (FERAHEME) 510 MG/17ML SOLN injection; Inject 17 mLs (510 mg total) into the vein every 14 (fourteen) days for 3 doses.

## 2018-12-08 NOTE — Assessment & Plan Note (Addendum)
Right lower quadrant iliac lymphadenopathy along with right inguinal lymphadenopathy likely related to either the internal hemorrhoids or potential pelvic process   We will also prescribe tramadol 100 mg every 8 hours as needed pain  We gave 60 tablets and check the [place] controlled substance database which only showed prescriptions for tramadol recently

## 2018-12-09 LAB — BASIC METABOLIC PANEL
BUN/Creatinine Ratio: 11 (ref 9–23)
BUN: 9 mg/dL (ref 6–24)
CO2: 18 mmol/L — ABNORMAL LOW (ref 20–29)
Calcium: 9.3 mg/dL (ref 8.7–10.2)
Chloride: 104 mmol/L (ref 96–106)
Creatinine, Ser: 0.81 mg/dL (ref 0.57–1.00)
GFR calc Af Amer: 105 mL/min/{1.73_m2} (ref >59)
GFR calc non Af Amer: 91 mL/min/{1.73_m2} (ref >59)
Glucose: 89 mg/dL (ref 65–99)
Potassium: 4.4 mmol/L (ref 3.5–5.2)
Sodium: 138 mmol/L (ref 134–144)

## 2018-12-09 LAB — CBC WITH DIFFERENTIAL/PLATELET
Basophils Absolute: 0.1 10*3/uL (ref 0.0–0.2)
Basos: 1 %
EOS (ABSOLUTE): 0.1 10*3/uL (ref 0.0–0.4)
Eos: 2 %
Hematocrit: 34.6 % (ref 34.0–46.6)
Hemoglobin: 11.1 g/dL (ref 11.1–15.9)
Immature Grans (Abs): 0 10*3/uL (ref 0.0–0.1)
Immature Granulocytes: 0 %
Lymphocytes Absolute: 2.2 10*3/uL (ref 0.7–3.1)
Lymphs: 32 %
MCH: 26 pg — ABNORMAL LOW (ref 26.6–33.0)
MCHC: 32.1 g/dL (ref 31.5–35.7)
MCV: 81 fL (ref 79–97)
Monocytes Absolute: 0.5 10*3/uL (ref 0.1–0.9)
Monocytes: 8 %
Neutrophils Absolute: 3.8 10*3/uL (ref 1.4–7.0)
Neutrophils: 57 %
Platelets: 330 10*3/uL (ref 150–450)
RBC: 4.27 x10E6/uL (ref 3.77–5.28)
RDW: 20 % — ABNORMAL HIGH (ref 11.7–15.4)
WBC: 6.7 10*3/uL (ref 3.4–10.8)

## 2018-12-10 NOTE — Telephone Encounter (Signed)
Patient aware of paperwork being faxed

## 2018-12-13 LAB — CYTOLOGY - PAP
Diagnosis: NEGATIVE
HPV: NOT DETECTED

## 2018-12-16 ENCOUNTER — Ambulatory Visit (HOSPITAL_COMMUNITY)
Admission: RE | Admit: 2018-12-16 | Discharge: 2018-12-16 | Disposition: A | Discharge status: Home / Self Care | Payer: Medicaid Other | Source: Ambulatory Visit | Attending: Family Medicine | Admitting: Family Medicine

## 2018-12-16 ENCOUNTER — Other Ambulatory Visit: Payer: Self-pay

## 2018-12-16 DIAGNOSIS — N921 Excessive and frequent menstruation with irregular cycle: Secondary | ICD-10-CM | POA: Diagnosis not present

## 2018-12-16 DIAGNOSIS — N920 Excessive and frequent menstruation with regular cycle: Secondary | ICD-10-CM | POA: Diagnosis not present

## 2018-12-16 DIAGNOSIS — D649 Anemia, unspecified: Secondary | ICD-10-CM | POA: Diagnosis not present

## 2018-12-16 DIAGNOSIS — N939 Abnormal uterine and vaginal bleeding, unspecified: Secondary | ICD-10-CM | POA: Diagnosis not present

## 2018-12-20 ENCOUNTER — Telehealth: Payer: Self-pay

## 2018-12-20 NOTE — Telephone Encounter (Signed)
Left VM message results ok, keep upcoming appt. Per Dr. Roselie Awkward; or call office if you have questions.

## 2018-12-20 NOTE — Telephone Encounter (Signed)
-----   Message from Michel Harrow, RN sent at 12/16/2018  3:36 PM EDT ----- Regarding: Results Received a call from the Radiologist stating that the provider needed to be aware of the mass that was seen.  Dr. Roselie Awkward notified.  Per Dr. Roselie Awkward pt just needs to be informed that her endometrial lining is normal and to keep her appt with him in four weeks.    Thank you Mel Almond, RN

## 2018-12-22 ENCOUNTER — Telehealth: Payer: Self-pay

## 2018-12-22 NOTE — Telephone Encounter (Signed)
S/w pt and advised of pap results ?

## 2018-12-29 ENCOUNTER — Telehealth: Payer: Self-pay | Admitting: *Deleted

## 2018-12-29 NOTE — Telephone Encounter (Signed)
Called and spoke with the patient regarding her appt. Gave appt date/time and instructions

## 2019-01-04 ENCOUNTER — Other Ambulatory Visit: Payer: Self-pay

## 2019-01-04 ENCOUNTER — Encounter (HOSPITAL_COMMUNITY): Payer: Self-pay | Admitting: *Deleted

## 2019-01-04 ENCOUNTER — Ambulatory Visit (HOSPITAL_COMMUNITY)
Admission: EM | Admit: 2019-01-04 | Discharge: 2019-01-04 | Disposition: A | Discharge status: Home / Self Care | Payer: Medicaid Other | Attending: Emergency Medicine | Admitting: Emergency Medicine

## 2019-01-04 DIAGNOSIS — N946 Dysmenorrhea, unspecified: Secondary | ICD-10-CM | POA: Diagnosis not present

## 2019-01-04 LAB — POCT URINALYSIS DIP (DEVICE)
Bilirubin Urine: NEGATIVE
Glucose, UA: NEGATIVE mg/dL
Ketones, ur: NEGATIVE mg/dL
Leukocytes,Ua: NEGATIVE
Nitrite: NEGATIVE
Protein, ur: NEGATIVE mg/dL
Specific Gravity, Urine: 1.025 (ref 1.005–1.030)
Urobilinogen, UA: 0.2 mg/dL (ref 0.0–1.0)
pH: 7 (ref 5.0–8.0)

## 2019-01-04 LAB — CBC
HCT: 38.5 % (ref 36.0–46.0)
Hemoglobin: 12.7 g/dL (ref 12.0–15.0)
MCH: 27 pg (ref 26.0–34.0)
MCHC: 33 g/dL (ref 30.0–36.0)
MCV: 81.7 fL (ref 80.0–100.0)
Platelets: 350 10*3/uL (ref 150–400)
RBC: 4.71 MIL/uL (ref 3.87–5.11)
RDW: 22.2 % — ABNORMAL HIGH (ref 11.5–15.5)
WBC: 8 10*3/uL (ref 4.0–10.5)
nRBC: 0 % (ref 0.0–0.2)

## 2019-01-04 MED ORDER — HYDROCODONE-ACETAMINOPHEN 5-325 MG PO TABS
ORAL_TABLET | ORAL | Status: AC
  Filled 2019-01-04: qty 1

## 2019-01-04 MED ORDER — HYDROCODONE-ACETAMINOPHEN 5-325 MG PO TABS: 1.0000 | ORAL_TABLET | Freq: Four times a day (QID) | ORAL | 0 refills | Status: DC | PRN

## 2019-01-04 MED ORDER — MEGESTROL ACETATE 40 MG PO TABS: 40.0000 mg | ORAL_TABLET | Freq: Two times a day (BID) | ORAL | 0 refills | Status: DC

## 2019-01-04 MED ORDER — HYDROCODONE-ACETAMINOPHEN 5-325 MG PO TABS
1.0000 | ORAL_TABLET | Freq: Once | ORAL | Status: AC
  Administered 2019-01-04: 1 via ORAL

## 2019-01-04 MED ORDER — HYDROCODONE-ACETAMINOPHEN 5-325 MG PO TABS
1.0000 | ORAL_TABLET | Freq: Once | ORAL | Status: DC
  Administered 2019-01-04: 1 via ORAL

## 2019-01-04 NOTE — ED Triage Notes (Signed)
C/O ongoing intermittent low abd cramping with lymphadenopathy.  States had first bx done & was told benign; hasn't received results of second bx, but has been scheduled to see [facility] oncologist. C/O worsening "menstrual" cramping since yesterday; unable to get in sooner w/ GYN onc.  PCP told pt to come to [facility].  Denies fevers, n/v/d, or any unusual vaginal discharge.

## 2019-01-04 NOTE — ED Provider Notes (Signed)
HPI  SUBJECTIVE:  Leah Baker is a 38 y.o. female who presents with low midline constant abdominal pain described as menstrual cramps for the past 3 days.  She called her PMD and was told to come here for evaluation.  She also reports continued pain in her right inguinal region where she had a lymph node biopsy.  She states it tends to get worse when her period starts.  She describes this pain as throbbing.  It has not changed in size, no erythema, drainage from the site.  She reports heavy vaginal bleeding, 4 pads per hour.  States that the bleeding is worse than normal.  No chest pain, shortness of breath, dizziness.  She does report mild lightheadedness.  She reports continued rectal bleeding with bowel movements only.  She has tried tramadol and Tylenol without improvement in her symptoms.  There are no other aggravating or alleviating factors.  She has a past medical history of internal hemorrhoids/GI bleed, anemia, baseline is 8.8-9, dysmenorrhea/heavy periods.  No history of diabetes, hypertension.  CBJ:SEGBTD, Burnett Harry, MD has an appointment with him on 8/18.  Gynecology: Dr. Roselie Awkward.  Gynecology/oncology: Has an appointment with them on 8/12.  Patient was seen here on 6/26 with dysmenorrhea.  Normal UA, negative pregnancy.  She was prescribed tramadol.  Was advised to continue iron supplementation and multivitamin.  Hemoglobin came back as 6.5, patient was notified and told to go to the ER.  She received 2 units of blood, was admitted to the hospital for observation.   Later seen by PMD on 7/8, was put on Megace 40 mg twice a day and given gynecology follow-up.  States that the Megace stopped the vaginal bleeding, but did not do anything for the pain.  She had a transabdominal vaginal ultrasound scheduled.  Past Medical History:  Diagnosis Date  . Acute costochondritis 11/08/2018   anterior chest wall pain  per pt pcp note in epic on 11-10-2018  . Costochondritis, acute 11/10/2018  .  Depression   . Hemorrhoids   . History of lower GI bleeding 08/31/2018   s/p  colonoscopy 09-01-2018,  hemorrhoids  . IDA (iron deficiency anemia)   . Inguinal lymphadenopathy    CT 09-14-2018  right side, followed by pcp  . PONV (postoperative nausea and vomiting)    only happened once  . Wears glasses     Past Surgical History:  Procedure Laterality Date  . BIOPSY  09/01/2018   Procedure: BIOPSY;  Surgeon: Otis Brace, MD;  Location: [facility];  Service: Gastroenterology;;  . CESAREAN SECTION  01/15/2002   @[facility]   . CESAREAN SECTION  03/17/2011   Procedure: CESAREAN SECTION;  Surgeon: Osborne Oman, MD;  Location: [facility];  Service: Gynecology;  Laterality: N/A;  . COLONOSCOPY WITH PROPOFOL N/A 09/01/2018   Procedure: COLONOSCOPY WITH PROPOFOL;  Surgeon: Otis Brace, MD;  Location: [facility];  Service: Gastroenterology;  Laterality: N/A;  . HYSTEROSCOPY W/D&C  06-21-2009  dr constant @[facility]    w/  REMOVAL IUD   . IUD REMOVAL  06/21/2009  . KNEE ARTHROSCOPY WITH ANTERIOR CRUCIATE LIGAMENT (ACL) REPAIR Left 09-08-2013   dr [name]  @[facility]    w/  meniscal repair  . MULTIPLE TOOTH EXTRACTIONS    . RECTAL EXAM UNDER ANESTHESIA N/A 11/18/2018   Procedure: ANORECTAL EXAM UNDER ANESTHESIA;  Surgeon: Ileana Roup, MD;  Location: [facility];  Service: General;  Laterality: N/A;  . TUBAL LIGATION Bilateral 2012    Family History  Problem  Relation Age of Onset  . Cancer Mother   . Kidney failure Mother   . Healthy Father     Social History   Tobacco Use  . Smoking status: Former Smoker    Years: 5.00    Types: Cigarettes    Quit date: 08/22/2008    Years since quitting: 10.3  . Smokeless tobacco: Never Used  Substance Use Topics  . Alcohol use: Yes    Comment: occasionally  . Drug use: Not Currently    Comment: per pt younger in college marijuana, none since    No current facility-administered medications for this encounter.   Current  Outpatient Medications:  .  ferrous sulfate 325 (65 FE) MG EC tablet, Take 1 tablet (325 mg total) by mouth 2 (two) times a day. (Patient taking differently: Take 325 mg by mouth 4 (four) times daily. ), Disp: 90 tablet, Rfl: 3 .  FIBER PO, Take by mouth., Disp: , Rfl:  .  Multiple Vitamins-Minerals (WOMENS MULTI VITAMIN & MINERAL PO), Take 1 tablet by mouth daily., Disp: , Rfl:  .  psyllium (HYDROCIL/METAMUCIL) 95 % PACK, Take 1 packet by mouth daily., Disp: , Rfl:  .  acetaminophen (TYLENOL) 325 MG tablet, Take 2 tablets (650 mg total) by mouth every 6 (six) hours as needed for mild pain (or Fever >/= 101)., Disp:  , Rfl:  .  ferumoxytol (FERAHEME) 510 MG/17ML SOLN injection, Inject 17 mLs (510 mg total) into the vein every 14 (fourteen) days for 3 doses., Disp: 51 mL, Rfl: 0 .  HYDROcodone-acetaminophen (NORCO/VICODIN) 5-325 MG tablet, Take 1-2 tablets by mouth every 6 (six) hours as needed for moderate pain or severe pain., Disp: 12 tablet, Rfl: 0 .  megestrol (MEGACE) 40 MG tablet, Take 1 tablet (40 mg total) by mouth 2 (two) times daily., Disp: 30 tablet, Rfl: 0  No Known Allergies   ROS  As noted in HPI.   Physical Exam  BP 115/79   Pulse 98   Temp 98.3 F (36.8 C) (Temporal)   Resp 20   LMP 01/02/2019 (Exact Date)   SpO2 100%   Constitutional: Well developed, well nourished, appears uncomfortable Eyes:  EOMI, conjunctiva normal bilaterally HENT: Normocephalic, atraumatic,mucus membranes moist Respiratory: Normal inspiratory effort Cardiovascular: Normal rate GI: nondistended, soft.  Positive suprapubic tenderness.  Negative Murphy, negative McBurney.  Hypoactive bowel sounds. Back: No CVAT Lymph: Positive tender healed biopsy site right medial upper thigh.  No erythema, induration, swelling.  No other appreciable inguinal lymphadenopathy. skin: No rash, skin intact Musculoskeletal: no deformities Neurologic: Alert & oriented x 3, no focal neuro deficits Psychiatric:  Speech and behavior appropriate   ED Course   Medications  HYDROcodone-acetaminophen (NORCO/VICODIN) 5-325 MG per tablet 1 tablet (1 tablet Oral Given 01/04/19 0856)  HYDROcodone-acetaminophen (NORCO/VICODIN) 5-325 MG per tablet (has no administration in time range)    Orders Placed This Encounter  Procedures  . CBC    Standing Status:   Standing    Number of Occurrences:   1  . POCT urinalysis dip (device)    Standing Status:   Standing    Number of Occurrences:   1    Results for orders placed or performed during the hospital encounter of 01/04/19 (from the past 24 hour(s))  POCT urinalysis dip (device)     Status: Abnormal   Collection Time: 01/04/19  8:35 AM  Result Value Ref Range   Glucose, UA NEGATIVE NEGATIVE mg/dL   Bilirubin Urine NEGATIVE NEGATIVE   Ketones,  ur NEGATIVE NEGATIVE mg/dL   Specific Gravity, Urine 1.025 1.005 - 1.030   Hgb urine dipstick MODERATE (A) NEGATIVE   pH 7.0 5.0 - 8.0   Protein, ur NEGATIVE NEGATIVE mg/dL   Urobilinogen, UA 0.2 0.0 - 1.0 mg/dL   Nitrite NEGATIVE NEGATIVE   Leukocytes,Ua NEGATIVE NEGATIVE  CBC     Status: Abnormal   Collection Time: 01/04/19  8:45 AM  Result Value Ref Range   WBC 8.0 4.0 - 10.5 K/uL   RBC 4.71 3.87 - 5.11 MIL/uL   Hemoglobin 12.7 12.0 - 15.0 g/dL   HCT 38.5 36.0 - 46.0 %   MCV 81.7 80.0 - 100.0 fL   MCH 27.0 26.0 - 34.0 pg   MCHC 33.0 30.0 - 36.0 g/dL   RDW 22.2 (H) 11.5 - 15.5 %   Platelets 350 150 - 400 K/uL   nRBC 0.0 0.0 - 0.2 %   No results found.  ED Clinical Impression  1. Dysmenorrhea      ED Assessment/Plan  Outside and previous records reviewed.  As noted in HPI.  [place] Narcotic database reviewed for this patient, and feel that the risk/benefit ratio today is favorable for proceeding with a prescription for controlled substance.  We will check a CBC, and give patient a Norco here as she appears significantly uncomfortable.  She has a comprehensive work-up already planned with  gynecology/oncology.  Will aim for pain control today and consider restarting on Megace.  Reevaluation, patient states that she does not feel much better after the Norco.  Will give another 1.  Will send home and contact her with CBC results.  Will restart on Megace to prevent recurrent anemia since she reports going through 4 pads per hour.  Also home with a prescription of Norco for severe pain, Tylenol for mild to moderate pain, to the ER for any signs of anemia, pain not controlled with medications or if she gets worse.  CBC noted.  Hemoglobin 12.7, stable since 7/8.  UA noted, but she is on menses.  Doubt nephrolithiasis, UTI.  Called patient and discussed labs with her.  Discussed labs, MDM, treatment plan, and plan for follow-up with patient. Discussed sn/sx that should prompt return to the ED. patient agrees with plan.   Meds ordered this encounter  Medications  . HYDROcodone-acetaminophen (NORCO/VICODIN) 5-325 MG per tablet 1 tablet  . DISCONTD: HYDROcodone-acetaminophen (NORCO/VICODIN) 5-325 MG per tablet 1 tablet  . megestrol (MEGACE) 40 MG tablet    Sig: Take 1 tablet (40 mg total) by mouth 2 (two) times daily.    Dispense:  30 tablet    Refill:  0  . HYDROcodone-acetaminophen (NORCO/VICODIN) 5-325 MG tablet    Sig: Take 1-2 tablets by mouth every 6 (six) hours as needed for moderate pain or severe pain.    Dispense:  12 tablet    Refill:  0    *This clinic note was created using [employer]. Therefore, there may be occasional mistakes despite careful proofreading.   ?   Melynda Ripple, MD 01/04/19 1008

## 2019-01-04 NOTE — ED Notes (Signed)
Pt verified she has ride home and will not be driving; also has food on her stomach.

## 2019-01-04 NOTE — Discharge Instructions (Addendum)
1 g of Tylenol 3-4 times a day, or 1-2 Norco for severe pain.  I am starting you on Megace to help slow down the vaginal bleeding.  I will call you if your CBC comes back significantly abnormal.  Please call your gynecologist and see if they have any other options for pain control.  Go immediately to the ER for chest pain, shortness of breath, severe lightheadedness, palpitations, if you pass out.

## 2019-01-05 ENCOUNTER — Ambulatory Visit (INDEPENDENT_AMBULATORY_CARE_PROVIDER_SITE_OTHER): Payer: Medicaid Other | Admitting: Obstetrics & Gynecology

## 2019-01-05 ENCOUNTER — Encounter: Payer: Self-pay | Admitting: Obstetrics & Gynecology

## 2019-01-05 DIAGNOSIS — N920 Excessive and frequent menstruation with regular cycle: Secondary | ICD-10-CM | POA: Diagnosis not present

## 2019-01-05 MED ORDER — MEGESTROL ACETATE 40 MG PO TABS: 40.0000 mg | ORAL_TABLET | Freq: Two times a day (BID) | ORAL | 3 refills | Status: DC

## 2019-01-05 NOTE — Progress Notes (Signed)
F/U from 12/07/18 visit  CC: pt notes she started a period on Sunday night Bleeding was heavy, severe cramping.  Pt went to urgent care yesterday due to pain.  *Pt had labs drawn.  Pt has restarted Megace 40mg 

## 2019-01-05 NOTE — Progress Notes (Signed)
Patient ID: Leah Baker, female   [name]: [birth year], 38 y.o.   MRN: [id number]   TELEHEALTH VIRTUAL GYNECOLOGY VISIT ENCOUNTER NOTE  I connected with [name] on 01/05/19 at  8:15 AM EDT by telephone at home and verified that I am speaking with the correct person using two identifiers.   I discussed the limitations, risks, security and privacy concerns of performing an evaluation and management service by telephone and the availability of in person appointments. I also discussed with the patient that there may be a patient responsible charge related to this service. The patient expressed understanding and agreed to proceed.   History:  Leah Baker is a 38 y.o. [id number] female being evaluated today for discussion of her continued menorrhagia and dysmenorrhea.  She was referred to see Dr Denman George due to lymphadenopathy. Her appointment is 01/12/19. When she ran out of her Megace she had heavy bleeding and cramps.  Past Medical History:  Diagnosis Date   Acute costochondritis 11/08/2018   anterior chest wall pain  per pt pcp note in epic on 11-10-2018   Costochondritis, acute 11/10/2018   Depression    Hemorrhoids    History of lower GI bleeding 08/31/2018   s/p  colonoscopy 09-01-2018,  hemorrhoids   IDA (iron deficiency anemia)    Inguinal lymphadenopathy    CT 09-14-2018  right side, followed by pcp   PONV (postoperative nausea and vomiting)    only happened once   Wears glasses    Past Surgical History:  Procedure Laterality Date   BIOPSY  09/01/2018   Procedure: BIOPSY;  Surgeon: Otis Brace, MD;  Location: [place];  Service: Gastroenterology;;   CESAREAN SECTION  01/15/2002   @[facility]    CESAREAN SECTION  03/17/2011   Procedure: CESAREAN SECTION;  Surgeon: Osborne Oman, MD;  Location: [facility];  Service: Gynecology;  Laterality: N/A;   COLONOSCOPY WITH PROPOFOL N/A 09/01/2018   Procedure: COLONOSCOPY WITH PROPOFOL;  Surgeon: Otis Brace, MD;  Location:  [place];  Service: Gastroenterology;  Laterality: N/A;   HYSTEROSCOPY W/D&C  06-21-2009  dr constant @[facility]    w/  REMOVAL IUD    IUD REMOVAL  06/21/2009   KNEE ARTHROSCOPY WITH ANTERIOR CRUCIATE LIGAMENT (ACL) REPAIR Left 09-08-2013   dr [name]  @[facility]    w/  meniscal repair   MULTIPLE TOOTH EXTRACTIONS     RECTAL EXAM UNDER ANESTHESIA N/A 11/18/2018   Procedure: ANORECTAL EXAM UNDER ANESTHESIA;  Surgeon: Ileana Roup, MD;  Location: [place];  Service: General;  Laterality: N/A;   TUBAL LIGATION Bilateral 2012   The following portions of the patient's history were reviewed and updated as appropriate: allergies, current medications, past family history, past medical history, past social history, past surgical history and problem list.   Health Maintenance:  Normal pap and negative HRHPV on 12/07/18.     Review of Systems:  Pertinent items noted in HPI and remainder of comprehensive ROS otherwise negative.  Physical Exam:   General:  Alert, oriented and cooperative.   Mental Status: Normal mood and affect perceived. Normal judgment and thought content.  Physical exam deferred due to nature of the encounter  Labs and Imaging Results for orders placed or performed during the hospital encounter of 01/04/19 (from the past 336 hour(s))  POCT urinalysis dip (device)   Collection Time: 01/04/19  8:35 AM  Result Value Ref Range   Glucose, UA NEGATIVE NEGATIVE mg/dL   Bilirubin Urine NEGATIVE NEGATIVE   Ketones, ur  NEGATIVE NEGATIVE mg/dL   Specific Gravity, Urine 1.025 1.005 - 1.030   Hgb urine dipstick MODERATE (A) NEGATIVE   pH 7.0 5.0 - 8.0   Protein, ur NEGATIVE NEGATIVE mg/dL   Urobilinogen, UA 0.2 0.0 - 1.0 mg/dL   Nitrite NEGATIVE NEGATIVE   Leukocytes,Ua NEGATIVE NEGATIVE  CBC   Collection Time: 01/04/19  8:45 AM  Result Value Ref Range   WBC 8.0 4.0 - 10.5 K/uL   RBC 4.71 3.87 - 5.11 MIL/uL   Hemoglobin 12.7 12.0 - 15.0 g/dL   HCT 38.5 36.0 -  46.0 %   MCV 81.7 80.0 - 100.0 fL   MCH 27.0 26.0 - 34.0 pg   MCHC 33.0 30.0 - 36.0 g/dL   RDW 22.2 (H) 11.5 - 15.5 %   Platelets 350 150 - 400 K/uL   nRBC 0.0 0.0 - 0.2 %   US Pelvic Complete With Transvaginal  Result Date: 12/16/2018 CLINICAL DATA:  Abnormal uterine bleeding. Menorrhagia with irregular cycles. History of bilateral tubal ligation and C-section. Anemia. LMP 11/25/2018. EXAM: TRANSABDOMINAL AND TRANSVAGINAL ULTRASOUND OF PELVIS TECHNIQUE: Both transabdominal and transvaginal ultrasound examinations of the pelvis were performed. Transabdominal technique was performed for global imaging of the pelvis including uterus, ovaries, adnexal regions, and pelvic cul-de-sac. It was necessary to proceed with endovaginal exam following the transabdominal exam to visualize the endometrium and ovaries. COMPARISON:  02/22/2013 and CT of the abdomen and pelvis on 09/14/2018 FINDINGS: Uterus Measurements: 10.2 x 4.2 x 5.5 centimeters = volume: 122.43 mL. No fibroids or other mass visualized. Endometrium Thickness: 8.3 millimeters.  No focal abnormality visualized. Right ovary Measurements: 3.1 x 2.9 x 2.8 centimeters = volume: 13.0 mL. Normal appearance/no adnexal mass. Left ovary Measurements: 3.3 x 2.5 x 2.5 centimeters = volume: 10.5 mL. Normal appearance/no adnexal mass. Other findings Adjacent to the discrete from the RIGHT ovary there is a small solid mass superior to the RIGHT ovary not associated with internal vascularity and measuring 1.9 x 1.1 x 1.2 centimeters. IMPRESSION: 1. RIGHT adnexal mass, discrete from the RIGHT ovary is 1.9 centimeters and avascular on Doppler evaluation. This likely represents enlarged RIGHT external iliac lymph node as seen on previous CT exams of the abdomen and pelvis 09/14/2018 and 08/31/2018. 2. Given the persistence of RIGHT iliac lymph node enlargement, consider biopsy of RIGHT inguinal lymph node, seen on prior CT exam. 3. Normal endometrial thickness. If bleeding  remains unresponsive to hormonal or medical therapy, sonohysterogram should be considered for focal lesion work-up. (Ref: Radiological Reasoning: Algorithmic Workup of Abnormal Vaginal Bleeding with Endovaginal Sonography and Sonohysterography. AJR 2008; 630:Z60-10) These results will be called to the ordering clinician or representative by the Radiologist Assistant, and communication documented in the PACS or zVision Dashboard. Electronically Signed   By: Nolon Nations [name]   On: 12/16/2018 13:02      Assessment and Plan:     1. Menorrhagia with regular cycle [place] results reviewed with her - megestrol (MEGACE) 40 MG tablet; Take 1 tablet (40 mg total) by mouth 2 (two) times daily.  Dispense: 30 tablet; Refill: 3 She declines Mirena as she had complications with an IUD in the past. She wants to schedule ablation, which will require endometrial biopsy pre-procedure.   After she sees Dr. Denman George we will complete her evaluation to have ablation as an OP  I discussed the assessment and treatment plan with the patient. The patient was provided an opportunity to ask questions and all were answered. The patient agreed with  the plan and demonstrated an understanding of the instructions.   The patient was advised to call back or seek an in-person evaluation/go to the ED if the symptoms worsen or if the condition fails to improve as anticipated.  I provided 15 minutes of non-face-to-face time during this encounter.   Emeterio Reeve, MD [facility] [place]

## 2019-01-05 NOTE — Patient Instructions (Signed)
Endometrial Ablation Endometrial ablation is a procedure that destroys the thin inner layer of the lining of the uterus (endometrium). This procedure may be done:  To stop heavy periods.  To stop bleeding that is causing anemia.  To control irregular bleeding.  To treat bleeding caused by small tumors (fibroids) in the endometrium. This procedure is often an alternative to major surgery, such as removal of the uterus and cervix (hysterectomy). As a result of this procedure:  You may not be able to have children. However, if you are premenopausal (you have not gone through menopause): ? You may still have a small chance of getting pregnant. ? You will need to use a reliable method of birth control after the procedure to prevent pregnancy.  You may stop having a menstrual period, or you may have only a small amount of bleeding during your period. Menstruation may return several years after the procedure. Tell a health care provider about:  Any allergies you have.  All medicines you are taking, including vitamins, herbs, eye drops, creams, and over-the-counter medicines.  Any problems you or family members have had with the use of anesthetic medicines.  Any blood disorders you have.  Any surgeries you have had.  Any medical conditions you have. What are the risks? Generally, this is a safe procedure. However, problems may occur, including:  A hole (perforation) in the uterus or bowel.  Infection of the uterus, bladder, or vagina.  Bleeding.  Damage to other structures or organs.  An air bubble in the lung (air embolus).  Problems with pregnancy after the procedure.  Failure of the procedure.  Decreased ability to diagnose cancer in the endometrium. What happens before the procedure?  You will have tests of your endometrium to make sure there are no pre-cancerous cells or cancer cells present.  You may have an ultrasound of the uterus.  You may be given medicines to  thin the endometrium.  Ask your health care provider about: ? Changing or stopping your regular medicines. This is especially important if you take diabetes medicines or blood thinners. ? Taking medicines such as aspirin and ibuprofen. These medicines can thin your blood. Do not take these medicines before your procedure if your doctor tells you not to.  Plan to have someone take you home from the hospital or clinic. What happens during the procedure?   You will lie on an exam table with your feet and legs supported as in a pelvic exam.  To lower your risk of infection: ? Your health care team will wash or sanitize their hands and put on germ-free (sterile) gloves. ? Your genital area will be washed with soap.  An IV tube will be inserted into one of your veins.  You will be given a medicine to help you relax (sedative).  A surgical instrument with a light and camera (resectoscope) will be inserted into your vagina and moved into your uterus. This allows your surgeon to see inside your uterus.  Endometrial tissue will be removed using one of the following methods: ? Radiofrequency. This method uses a radiofrequency-alternating electric current to remove the endometrium. ? Cryotherapy. This method uses extreme cold to freeze the endometrium. ? Heated-free liquid. This method uses a heated saltwater (saline) solution to remove the endometrium. ? Microwave. This method uses high-energy microwaves to heat up the endometrium and remove it. ? Thermal balloon. This method involves inserting a catheter with a balloon tip into the uterus. The balloon tip is filled with   heated fluid to remove the endometrium. The procedure may vary among health care providers and hospitals. What happens after the procedure?  Your blood pressure, heart rate, breathing rate, and blood oxygen level will be monitored until the medicines you were given have worn off.  As tissue healing occurs, you may notice  vaginal bleeding for 4-6 weeks after the procedure. You may also experience: ? Cramps. ? Thin, watery vaginal discharge that is light pink or brown in color. ? A need to urinate more frequently than usual. ? Nausea.  Do not drive for 24 hours if you were given a sedative.  Do not have sex or insert anything into your vagina until your health care provider approves. Summary  Endometrial ablation is done to treat the many causes of heavy menstrual bleeding.  The procedure may be done only after medications have been tried to control the bleeding.  Plan to have someone take you home from the hospital or clinic. This information is not intended to replace advice given to you by your health care provider. Make sure you discuss any questions you have with your health care provider. Document Released: 03/28/2004 Document Revised: 11/03/2017 Document Reviewed: 06/05/2016 Elsevier Patient Education  2020 Elsevier Inc.  

## 2019-01-12 ENCOUNTER — Ambulatory Visit: Payer: Medicaid Other | Admitting: Gynecologic Oncology

## 2019-01-17 NOTE — Progress Notes (Signed)
Subjective:    Patient ID: Leah Baker, female    [name]: 02/16/1981, 38 y.o.   MRN: [id number]  This is a 38 year old female seen back in follow-up for lower GI bleeding, blood loss anemia, internal hemorrhoids, and iliac and right inguinal lymphadenopathy, and now a new problem regarding menorrhagia.  Since last visit the patient required hospitalization with a hemoglobin of 5 and was determined to have excess uterine bleeding in addition to the ongoing hemorrhoidal bleeding from internal hemorrhoids.  The patient continues to have right lower quadrant abdominal pain and right inguinal lymphadenopathy and right iliac lymphadenopathy.  Note previous abdominal CT scans have not revealed pelvic pathology.  The patient has since seen gynecology Dr. Roselie Awkward upon request of the general surgeon.  It appears that the vaginal and abdominal ultrasound will be obtained and is scheduled next week.  Previous ultrasounds of the abdomen and pelvis in 2014 was normal.  There is been a prior history of heavy vaginal bleeding and this is a compounded the iron deficiency anemia.  1 recommendation of the discharge summary was for the patient received intravenous iron.  In the hospitalization she received 2 units of packed cells and 1 dose of 510 mg Feraheme.  Currently the patient shortness of breath is better but her abdominal pain continues to persist.  She has had no additional chest pains.  Note she cannot take nonsteroidal anti-inflammatories due to the bleeding.  Note also the patient is now on Megace in addition to iron supplementation and this has helped to slow down the menstrual bleeding.  The patient also had hypokalemia which needs to be followed up as well  01/18/2019 Since the last office visit the patient had to go to urgent care on August 4 and then had a follow-up with gynecology on August 5 for menorrhagia and continued abdominal pain.  The patient's been maintained on Megace 40 mg twice daily.   General surgery states the internal hemorrhoids do not require further treatment and is not the cause of the abdominal pain.  Gynecology feels that a ablation procedure is indicated for the excess menstrual bleeding.  Patient will first need an endometrial biopsy and this is pending.  The patient is yet to receive her Feraheme injections.  The patient had a hemoglobin checked on August 4 it was stable at 12.7. Since last OV went to [facility]  8/4 and saw Gyn 8/5 for menorrhagia and continued abdominal pain.          Past Medical History:  Diagnosis Date  . Acute costochondritis 11/08/2018   anterior chest wall pain  per pt pcp note in epic on 11-10-2018  . Costochondritis, acute 11/10/2018  . Depression   . Hemorrhoids   . History of lower GI bleeding 08/31/2018   s/p  colonoscopy 09-01-2018,  hemorrhoids  . IDA (iron deficiency anemia)   . Inguinal lymphadenopathy    CT 09-14-2018  right side, followed by pcp  . PONV (postoperative nausea and vomiting)    only happened once  . Wears glasses      Family History  Problem Relation Age of Onset  . Cancer Mother   . Kidney failure Mother   . Healthy Father      Social History   Socioeconomic History  . Marital status: Single    Spouse name: Not on file  . Number of children: Not on file  . Years of education: Not on file  . Highest education level: Not on file  Occupational History  . Not on file  Social Needs  . Financial resource strain: Not on file  . Food insecurity    Worry: Not on file    Inability: Not on file  . Transportation needs    Medical: Not on file    Non-medical: Not on file  Tobacco Use  . Smoking status: Former Smoker    Years: 5.00    Types: Cigarettes    Quit date: 08/22/2008    Years since quitting: 10.4  . Smokeless tobacco: Never Used  Substance and Sexual Activity  . Alcohol use: Yes    Comment: occasionally  . Drug use: Not Currently    Comment: per pt younger in college marijuana, none since   . Sexual activity: Yes    Birth control/protection: Surgical  Lifestyle  . Physical activity    Days per week: Not on file    Minutes per session: Not on file  . Stress: Not on file  Relationships  . Social Herbalist on phone: Not on file    Gets together: Not on file    Attends religious service: Not on file    Active member of club or organization: Not on file    Attends meetings of clubs or organizations: Not on file    Relationship status: Not on file  . Intimate partner violence    Fear of current or ex partner: Not on file    Emotionally abused: Not on file    Physically abused: Not on file    Forced sexual activity: Not on file  Other Topics Concern  . Not on file  Social History Narrative  . Not on file     No Known Allergies   Outpatient Medications Prior to Visit  Medication Sig Dispense Refill  . acetaminophen (TYLENOL) 325 MG tablet Take 2 tablets (650 mg total) by mouth every 6 (six) hours as needed for mild pain (or Fever >/= 101).    . ferrous sulfate 325 (65 FE) MG EC tablet Take 1 tablet (325 mg total) by mouth 2 (two) times a day. (Patient taking differently: Take 325 mg by mouth 4 (four) times daily. ) 90 tablet 3  . megestrol (MEGACE) 40 MG tablet Take 1 tablet (40 mg total) by mouth 2 (two) times daily. 30 tablet 3  . Multiple Vitamins-Minerals (WOMENS MULTI VITAMIN & MINERAL PO) Take 1 tablet by mouth daily.    . psyllium (HYDROCIL/METAMUCIL) 95 % PACK Take 1 packet by mouth daily.    . ferumoxytol (FERAHEME) 510 MG/17ML SOLN injection Inject 17 mLs (510 mg total) into the vein every 14 (fourteen) days for 3 doses. (Patient not taking: Reported on 01/18/2019) 51 mL 0  . FIBER PO Take by mouth.    Marland Kitchen HYDROcodone-acetaminophen (NORCO/VICODIN) 5-325 MG tablet Take 1-2 tablets by mouth every 6 (six) hours as needed for moderate pain or severe pain. (Patient not taking: Reported on 01/18/2019) 12 tablet 0   No facility-administered medications prior  to visit.      Constitutional:   No  weight loss, night sweats,  Fevers, chills, fatigue, lassitude. HEENT:   No headaches,  Difficulty swallowing,  Tooth/dental problems,  Sore throat,                No sneezing, itching, ear ache, nasal congestion, post nasal drip,   CV:   chest pain,  Orthopnea, PND, swelling in lower extremities, anasarca, dizziness, palpitations  GI   heartburn, indigestion, abdominal  pain, nausea, vomiting, diarrhea, change in bowel habits, loss of appetite,  BLood in stool  Resp: No shortness of breath with exertion or at rest.  No excess mucus, no productive cough,  No non-productive cough,  No coughing up of blood.  No change in color of mucus.  No wheezing.  No chest wall deformity  Skin: no rash or lesions. GYN: heavy prolonged menses  GU: no dysuria, change in color of urine, no urgency or frequency.  No flank pain.  MS:  No joint pain or swelling.  No decreased range of motion.  No back pain.  Psych:  No change in mood or affect. No depression or anxiety.  No memory loss.     Objective:   Physical Exam Vitals:   01/18/19 0849  BP: 99/70  Pulse: (!) 103  Temp: 98.5 F (36.9 C)  TempSrc: Oral  SpO2: 96%  Weight: 154 lb 3.2 oz (69.9 kg)  Height: 5\' 2"  (1.575 m)    Gen: Pleasant, well-nourished, in no distress,  normal affect  ENT: No lesions,  mouth clear,  oropharynx clear, no postnasal drip  Neck: No JVD, no TMG, no carotid bruits  Lungs: No use of accessory muscles, no dullness to percussion, clear without rales or rhonchi  Cardiovascular: RRR, heart sounds normal, no murmur or gallops, no peripheral edema   Abdomen: soft tender RLQ  R inguinal LAN  Musculoskeletal: No deformities, no cyanosis or clubbing  Neuro: alert, non focal  Skin: Warm, no lesions or rashes CBC Latest Ref Rng & Units 01/04/2019 12/08/2018 11/27/2018  WBC 4.0 - 10.5 K/uL 8.0 6.7 8.4  Hemoglobin 12.0 - 15.0 g/dL 12.7 11.1 8.0(L)  Hematocrit 36.0 - 46.0 % 38.5  34.6 25.2(L)  Platelets 150 - 400 K/uL 350 330 495(H)   BMP Latest Ref Rng & Units 12/08/2018 11/27/2018 11/26/2018  Glucose 65 - 99 mg/dL 89 94 102(H)  BUN 6 - 24 mg/dL 9 8 14   Creatinine 0.57 - 1.00 mg/dL 0.81 0.78 1.02(H)  BUN/Creat Ratio 9 - 23 11 - -  Sodium 134 - 144 mmol/L 138 139 134(L)  Potassium 3.5 - 5.2 mmol/L 4.4 4.4 3.1(L)  Chloride 96 - 106 mmol/L 104 110 103  CO2 20 - 29 mmol/L 18(L) 22 21(L)  Calcium 8.7 - 10.2 mg/dL 9.3 8.2(L) 8.7(L)   12/16/18 Vag U/S IMPRESSION: 1. RIGHT adnexal mass, discrete from the RIGHT ovary is 1.9 centimeters and avascular on Doppler evaluation. This likely represents enlarged RIGHT external iliac lymph node as seen on previous CT exams of the abdomen and pelvis 09/14/2018 and 08/31/2018. 2. Given the persistence of RIGHT iliac lymph node enlargement, consider biopsy of RIGHT inguinal lymph node, seen on prior CT exam. 3. Normal endometrial thickness. If bleeding remains unresponsive to hormonal or medical therapy, sonohysterogram should be considered for focal lesion work-up. (Ref: Radiological Reasoning: Algorithmic  Iron level 14- 17  (low normal 28) from 6/27    Assessment & Plan:  I personally reviewed all images and lab data in the [facility] system as well as any outside material available during this office visit and agree with the  radiology impressions.   Hemorrhoid Internal hemorrhoids stable at this time the patient will continue to work on a bowel program to prevent constipation  Rectal bleeding Rectal bleeding appears to have abated and is not a major issue at this time  Inguinal lymphadenopathy Right inguinal and iliac lymphadenopathy which is a source of the patient's right lower quadrant pain likely reactive  from endometrial process  No further biopsies are indicated  Iron deficiency anemia Iron deficiency anemia we will proceed with Feraheme infusions x3 and had established that visits  Right lower quadrant abdominal pain  Right lower quadrant abdominal pain secondary to ongoing inflammation in the lymph nodes secondary to intrauterine process  Other orders -     ferumoxytol (FERAHEME) 510 MG/17ML SOLN injection; Inject 17 mLs (510 mg total) into the vein every 14 (fourteen) days for 3 doses.

## 2019-01-18 ENCOUNTER — Encounter: Payer: Self-pay | Admitting: Critical Care Medicine

## 2019-01-18 ENCOUNTER — Other Ambulatory Visit: Payer: Self-pay

## 2019-01-18 ENCOUNTER — Ambulatory Visit: Payer: Medicaid Other | Attending: Critical Care Medicine | Admitting: Critical Care Medicine

## 2019-01-18 VITALS — BP 99/70 | HR 103 | Temp 98.5°F | Ht 62.0 in | Wt 154.2 lb

## 2019-01-18 DIAGNOSIS — K625 Hemorrhage of anus and rectum: Secondary | ICD-10-CM | POA: Diagnosis not present

## 2019-01-18 DIAGNOSIS — D649 Anemia, unspecified: Secondary | ICD-10-CM

## 2019-01-18 DIAGNOSIS — N920 Excessive and frequent menstruation with regular cycle: Secondary | ICD-10-CM | POA: Diagnosis not present

## 2019-01-18 DIAGNOSIS — R1031 Right lower quadrant pain: Secondary | ICD-10-CM

## 2019-01-18 DIAGNOSIS — R590 Localized enlarged lymph nodes: Secondary | ICD-10-CM | POA: Diagnosis not present

## 2019-01-18 DIAGNOSIS — K649 Unspecified hemorrhoids: Secondary | ICD-10-CM

## 2019-01-18 DIAGNOSIS — D500 Iron deficiency anemia secondary to blood loss (chronic): Secondary | ICD-10-CM

## 2019-01-18 MED ORDER — FERAHEME 510 MG/17ML IV SOLN: 510.0000 mg | INTRAVENOUS | 0 refills | Status: DC

## 2019-01-18 NOTE — Assessment & Plan Note (Signed)
Rectal bleeding appears to have abated and is not a major issue at this time

## 2019-01-18 NOTE — Assessment & Plan Note (Signed)
Iron deficiency anemia we will proceed with Feraheme infusions x3 and had established that visits

## 2019-01-18 NOTE — Assessment & Plan Note (Signed)
Right lower quadrant abdominal pain secondary to ongoing inflammation in the lymph nodes secondary to intrauterine process

## 2019-01-18 NOTE — Assessment & Plan Note (Signed)
Internal hemorrhoids stable at this time the patient will continue to work on a bowel program to prevent constipation

## 2019-01-18 NOTE — Patient Instructions (Signed)
The iron infusions will be reordered please follow through with the infusions at  the patient care center  Please follow up with your appointment with Dr. Denman George for the endometrial biopsy  No other medication changes  We will notify you about a nurse visit for flu vaccine

## 2019-01-18 NOTE — Assessment & Plan Note (Signed)
Right inguinal and iliac lymphadenopathy which is a source of the patient's right lower quadrant pain likely reactive from endometrial process  No further biopsies are indicated

## 2019-01-19 ENCOUNTER — Ambulatory Visit: Payer: Medicaid Other

## 2019-01-21 ENCOUNTER — Encounter: Payer: Self-pay | Admitting: Gynecologic Oncology

## 2019-01-21 ENCOUNTER — Other Ambulatory Visit: Payer: Self-pay

## 2019-01-21 ENCOUNTER — Inpatient Hospital Stay: Payer: Medicaid Other | Attending: Gynecologic Oncology | Admitting: Gynecologic Oncology

## 2019-01-21 VITALS — BP 107/72 | HR 72 | Temp 98.3°F | Resp 18 | Ht 62.0 in | Wt 154.8 lb

## 2019-01-21 DIAGNOSIS — N949 Unspecified condition associated with female genital organs and menstrual cycle: Secondary | ICD-10-CM | POA: Diagnosis not present

## 2019-01-21 DIAGNOSIS — N939 Abnormal uterine and vaginal bleeding, unspecified: Secondary | ICD-10-CM | POA: Diagnosis not present

## 2019-01-21 DIAGNOSIS — N920 Excessive and frequent menstruation with regular cycle: Secondary | ICD-10-CM | POA: Insufficient documentation

## 2019-01-21 DIAGNOSIS — R590 Localized enlarged lymph nodes: Secondary | ICD-10-CM | POA: Diagnosis not present

## 2019-01-21 NOTE — Patient Instructions (Signed)
Dr Denman George took a biopsy of the uterine lining today. If this is malignant (cancer) she will recommend a hysterectomy). If it is benign, there are options to control the bleeding including: continuing megace, an IUD, an ablation, or a hysterectomy. Please follow-up with Dr Roselie Awkward as scheduled to discuss further. She has ordered an MRI to better identify the uterine wall and right ovary to distinguish if there is pathology within those organs.   She is referring you to see a hematologist oncologist, Dr Irene Limbo, to discuss the enlarged lymph nodes which do not appear to be related to the uterine bleeding abnormalities.   Her office can be reached at [phone].

## 2019-01-21 NOTE — Progress Notes (Signed)
Consult Note: Gyn-Onc  Consult was requested by Dr. Annye English for the evaluation of Leah Baker 38 y.o. female  CC:  Chief Complaint  Patient presents with  . abnormal uterine bleeding  . Lymphadenopathy    Assessment/Plan:  Leah Baker  is a 38 y.o.  year old with menorrhagia, pelvic and right inguinal lymphadenopathy (avoid hyperplasia).  It is not clear to me that these conditions are related.  We will evaluate for occult endometrial cancer causing metastatic lymphadenopathy with today's endometrial biopsy.  If this is benign, it is reasonable to assume that these are unrelated conditions.  With respect to her abnormal uterine bleeding she is interested in less aggressive treatments and is not interested in hysterectomy until less radical interventions have been exhausted.  I offered her with options of progestin therapy (continuing Megace or potentially having an progestin releasing IUD placed), or endometrial ablation, or definitive hysterectomy.  She is set to see Dr. Roselie Awkward in September and we will discuss continuing medical therapy or endometrial lesion if today's biopsy is benign.  Obviously if today's biopsy shows malignancy will proceed with hysterectomy and staging procedures as appropriate.  With respect to her lymphoid hyperplasia, given that it is symptomatic, I am referring her to my colleague Dr. Irene Limbo from hematologic oncology.  I value his input regarding the etiology and treatment of the symptomatic lymphadenopathy.  I have ordered an MRI of the pelvis to ensure that the right ovary is in fact grossly normal in nonenlarged, and that the enlarged adnexal mass is in fact an iliac lymph node adjacent to the ovary.  This MRI will also help better characterize the uterus including the myometrium and look for signs of adenomyosis which might be a cause of her abnormal bleeding and less likely to be adequately treated with endometrial ablation.  If she has a cancer  diagnosis from biopsy, or if she desires hysterectomy she will continue care with me.  Alternatively she will return to see her gynecologist, Dr. Roselie Awkward for discussion of options to control her abnormal uterine bleeding.   HPI: Leah Baker is a very pleasant 38 year old P2 who is seen in consultation at the request of Dr. Nadeen Landau for menorrhagia in the setting of pelvic and right inguinal lymphadenopathy.  The patient reports having a history of menorrhagia starting in the springtime of 2020.  Around this time she also developed right-sided inguinal discomfort.   On September 14, 2018 she underwent CT scan of the abdomen and pelvis which revealed no significant abnormalities of the uterus, low-attenuation lesions of the bilateral ovaries consistent with functional cysts.  There were enlarged right inguinal and iliac lymph nodes that were unchanged from a prior CT scan in March 2020.  The largest right iliac lymph node measured 2.3 cm.  She was admitted to the hospital in June 2020.  She was experiencing heavy bleeding with anemia she was also experiencing rectal bleeding.  She received a blood transfusion and an exam under anesthesia with Dr. Dema Severin and perianal and intra-anal examination was performed which revealed internal hemorrhoids but no apparent malignancy.  Transvaginal ultrasound had been performed on December 16, 2018 which revealed a uterus measuring 10.2 x 4.2 x 5.5 cm.  The endometrium was 8.3 mm.  The right ovary measured 3.1 x 2.9 x 2.8 cm and the left ovary measured 3.3 x 232.5 x 2.5 cm.  Both ovaries were grossly normal.  Adjacent to the right ovary there is a small solid mass  not associated with internal vascularity measuring 1.9 x 1.1 x 1.2 cm.  It was felt to be most likely an enlarged right external iliac lymph node adjacent to the right ovary.  Core biopsy of the right groin lymph node had been performed earlier in April 2020 and was revealing for lymphoid hyperplasia with  flow cytometry negative for lymphoma.  She continued to have abnormal bleeding and was put on Megace in June 2020.  This stopped her bleeding.  She bled again in July 2020 when she was out of Megace.  When it was restarted in August 2020 her bleeding stopped again.  She is otherwise a healthy woman.  She is had 2 prior cesarean section and a tubal ligation.  She has no family history of malignancy.  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  She works for [employer].  Current Meds:  Outpatient Encounter Medications as of 01/21/2019  Medication Sig  . acetaminophen (TYLENOL) 325 MG tablet Take 2 tablets (650 mg total) by mouth every 6 (six) hours as needed for mild pain (or Fever >/= 101).  . ferrous sulfate 325 (65 FE) MG EC tablet Take 1 tablet (325 mg total) by mouth 2 (two) times a day. (Patient taking differently: Take 325 mg by mouth 4 (four) times daily. )  . ferumoxytol (FERAHEME) 510 MG/17ML SOLN injection Inject 17 mLs (510 mg total) into the vein every 14 (fourteen) days for 3 doses.  . megestrol (MEGACE) 40 MG tablet Take 1 tablet (40 mg total) by mouth 2 (two) times daily.  . Multiple Vitamins-Minerals (WOMENS MULTI VITAMIN & MINERAL PO) Take 1 tablet by mouth daily.  . psyllium (HYDROCIL/METAMUCIL) 95 % PACK Take 1 packet by mouth daily.   No facility-administered encounter medications on file as of 01/21/2019.     Allergy: No Known Allergies  Social Hx:   Social History   Socioeconomic History  . Marital status: Single    Spouse name: Not on file  . Number of children: Not on file  . Years of education: Not on file  . Highest education level: Not on file  Occupational History  . Not on file  Social Needs  . Financial resource strain: Not on file  . Food insecurity    Worry: Not on file    Inability: Not on file  . Transportation needs    Medical: Not on file    Non-medical: Not on file  Tobacco Use  . Smoking status: Former Smoker    Years:  5.00    Types: Cigarettes    Quit date: 08/22/2008    Years since quitting: 10.4  . Smokeless tobacco: Never Used  Substance and Sexual Activity  . Alcohol use: Yes    Comment: occasionally  . Drug use: Not Currently    Comment: per pt younger in college marijuana, none since  . Sexual activity: Yes    Birth control/protection: Surgical  Lifestyle  . Physical activity    Days per week: Not on file    Minutes per session: Not on file  . Stress: Not on file  Relationships  . Social Herbalist on phone: Not on file    Gets together: Not on file    Attends religious service: Not on file    Active member of club or organization: Not on file    Attends meetings of clubs or organizations: Not on file    Relationship status: Not on file  .  Intimate partner violence    Fear of current or ex partner: Not on file    Emotionally abused: Not on file    Physically abused: Not on file    Forced sexual activity: Not on file  Other Topics Concern  . Not on file  Social History Narrative  . Not on file    Past Surgical Hx:  Past Surgical History:  Procedure Laterality Date  . BIOPSY  09/01/2018   Procedure: BIOPSY;  Surgeon: Otis Brace, MD;  Location: [facility];  Service: Gastroenterology;;  . CESAREAN SECTION  01/15/2002   @[facility]   . CESAREAN SECTION  03/17/2011   Procedure: CESAREAN SECTION;  Surgeon: Osborne Oman, MD;  Location: [facility];  Service: Gynecology;  Laterality: N/A;  . COLONOSCOPY WITH PROPOFOL N/A 09/01/2018   Procedure: COLONOSCOPY WITH PROPOFOL;  Surgeon: Otis Brace, MD;  Location: [facility];  Service: Gastroenterology;  Laterality: N/A;  . HYSTEROSCOPY W/D&C  06-21-2009  dr constant @[facility]    w/  REMOVAL IUD   . IUD REMOVAL  06/21/2009  . KNEE ARTHROSCOPY WITH ANTERIOR CRUCIATE LIGAMENT (ACL) REPAIR Left 09-08-2013   dr [name]  @[facility]    w/  meniscal repair  . MULTIPLE TOOTH EXTRACTIONS    . RECTAL EXAM UNDER ANESTHESIA N/A 11/18/2018   Procedure:  ANORECTAL EXAM UNDER ANESTHESIA;  Surgeon: Ileana Roup, MD;  Location: [facility];  Service: General;  Laterality: N/A;  . TUBAL LIGATION Bilateral 2012    Past Medical Hx:  Past Medical History:  Diagnosis Date  . Acute costochondritis 11/08/2018   anterior chest wall pain  per pt pcp note in epic on 11-10-2018  . Costochondritis, acute 11/10/2018  . Depression   . Hemorrhoids   . History of lower GI bleeding 08/31/2018   s/p  colonoscopy 09-01-2018,  hemorrhoids  . IDA (iron deficiency anemia)   . Inguinal lymphadenopathy    CT 09-14-2018  right side, followed by pcp  . PONV (postoperative nausea and vomiting)    only happened once  . Wears glasses     Past Gynecological History:  See HPI Patient's last menstrual period was 01/02/2019 (exact date).  Family Hx:  Family History  Problem Relation Age of Onset  . Cancer Mother   . Kidney failure Mother   . Healthy Father     Review of Systems:  Constitutional  Feels well,   ENT Normal appearing ears and nares bilaterally Skin/Breast  No rash, sores, jaundice, itching, dryness Cardiovascular  No chest pain, shortness of breath, or edema  Pulmonary  No cough or wheeze.  Gastro Intestinal  No nausea, vomitting, or diarrhoea. + occasional bright red blood per rectum, no abdominal pain, change in bowel movement, or constipation.  Genito Urinary  No frequency, urgency, dysuria, menorrhagia Musculo Skeletal  No myalgia, arthralgia, joint swelling or pain  Neurologic  No weakness, numbness, change in gait,  Psychology  No depression, anxiety, insomnia.   Vitals:  Blood pressure 107/72, pulse 72, temperature 98.3 F (36.8 C), temperature source Oral, resp. rate 18, height 5\' 2"  (1.575 m), weight 154 lb 12.8 oz (70.2 kg), last menstrual period 01/02/2019, SpO2 100 %.  Physical Exam: WD in NAD Neck  Supple NROM, without any enlargements.  Lymph Node Survey No cervical supraclavicular or  inguinal adenopathy Cardiovascular  Pulse normal rate, regularity and rhythm. S1 and S2 normal.  Lungs  Clear to auscultation bilateraly, without wheezes/crackles/rhonchi. Good air movement.  Skin  No rash/lesions/breakdown  Psychiatry  Alert and  oriented to person, place, and time  Abdomen  Normoactive bowel sounds, abdomen soft, non-tender and overweight without evidence of hernia.  Back No CVA tenderness Genito Urinary  Vulva/vagina: Normal external female genitalia.  No lesions. No discharge or bleeding.  Bladder/urethra:  No lesions or masses, well supported bladder  Vagina: normal  Cervix: Normal appearing, no lesions.  Uterus: slightly bulky, mobile, no parametrial involvement or nodularity.  Adnexa: no palpable masses. Rectal  deferred Extremities  No bilateral cyanosis, clubbing or edema.  Procedure Note:  Preop Dx: menorrhagia Postop Dx: same Procedure: endometrial biopsy Surgeon: Dorann Ou, MD EBL: scant Specimens: endometrial biopsy Complications: none  Procedure Details: The patient provided verbal consent and verbal timeout was performed.  Speculum was inserted to the vagina and the cervix was visualized.  It was grasped with a single-tooth tenaculum.  An endometrial Pipelle was inserted to the level of the uterine fundus twice.  The fundal depth was 10 cm.  A small amount of tissue was aspirated on both occasions through the Pipelle.  It was sent for permanent histopathology.  Tenaculum was removed and the patient tolerated the procedure well.   Thereasa Solo, MD  01/21/2019, 5:47 PM

## 2019-01-24 ENCOUNTER — Telehealth: Payer: Self-pay

## 2019-01-24 NOTE — Telephone Encounter (Signed)
I spoke with Leah Baker this pm.  I told her that the results of her endometrial biopsy was not cancer

## 2019-01-25 ENCOUNTER — Telehealth: Payer: Self-pay

## 2019-01-25 NOTE — Telephone Encounter (Signed)
I s poke with Ms Leah Baker this am.  Per Dr. Denman George  "No sign of cancer in the uterus causing the enlarged lymph nodes.  Hysterectomy not necessary. It is reasonable for her to follow-up with Dr Roselie Awkward as planned to discuss options for long term management of her bleeding symptoms.  Will await MRI results to better look at the ovary and uterine wall." Pt verbalized understanding.  Pt states that she has been bleeding since her biopsy.  She is using 2 maxi pads per day. She is not bleeding as much as her period.  She is also experiencing cramping.  She is still taking Megace but now has this break through bleeding. She took tylenol for the cramping without relief. She states she is unable to take ibuprofen secondary to bleeding internal hemorrhoids.   I told Ms Leah Baker that I would relay this to Dr. Denman George and Joylene John, NP.

## 2019-01-25 NOTE — Telephone Encounter (Signed)
I spoke with Leah Baker and told her Dr. Serita Grit recommendation. The pt should call Dr Jordan Hawks office and discuss her current bleeding with him.  He may want to adjust her Megace.  Pt verbalized understanding

## 2019-01-26 ENCOUNTER — Encounter (HOSPITAL_COMMUNITY): Payer: Medicaid Other

## 2019-01-26 ENCOUNTER — Other Ambulatory Visit: Payer: Self-pay | Admitting: Obstetrics & Gynecology

## 2019-01-26 ENCOUNTER — Telehealth: Payer: Self-pay | Admitting: *Deleted

## 2019-01-26 DIAGNOSIS — N920 Excessive and frequent menstruation with regular cycle: Secondary | ICD-10-CM

## 2019-01-26 MED ORDER — MEGESTROL ACETATE 40 MG PO TABS: 80.0000 mg | ORAL_TABLET | Freq: Two times a day (BID) | ORAL | 3 refills | Status: DC

## 2019-01-26 NOTE — Progress Notes (Signed)
Increased to 80 mg Megace BID for reported vaginal bleeding

## 2019-01-26 NOTE — Telephone Encounter (Signed)
Pt called to office stating she was still bleeding and in pain/cramping. Pt states she spoke with Dr Denman George office and they recommend calling us and verify if Megace needs to be changed.  Reviewed with Dr Roselie Awkward, he changed Megace order and states pt needs f/u appt. Advised to continue with Tylenol every 6 hours.   Pt made aware of changes and recommendations.  Pt advised to contact office if bleeding/pain continues after change in Rx. Pt was transferred to front staff to complete appt scheduling.

## 2019-01-31 ENCOUNTER — Other Ambulatory Visit: Payer: Self-pay

## 2019-01-31 ENCOUNTER — Telehealth: Payer: Self-pay

## 2019-01-31 ENCOUNTER — Ambulatory Visit (HOSPITAL_COMMUNITY)
Admission: RE | Admit: 2019-01-31 | Discharge: 2019-01-31 | Disposition: A | Discharge status: Home / Self Care | Payer: Medicaid Other | Source: Ambulatory Visit | Attending: Gynecologic Oncology | Admitting: Gynecologic Oncology

## 2019-01-31 ENCOUNTER — Other Ambulatory Visit: Payer: Self-pay | Admitting: Gynecologic Oncology

## 2019-01-31 DIAGNOSIS — F40240 Claustrophobia: Secondary | ICD-10-CM

## 2019-01-31 DIAGNOSIS — R590 Localized enlarged lymph nodes: Secondary | ICD-10-CM

## 2019-01-31 DIAGNOSIS — N920 Excessive and frequent menstruation with regular cycle: Secondary | ICD-10-CM

## 2019-01-31 DIAGNOSIS — N949 Unspecified condition associated with female genital organs and menstrual cycle: Secondary | ICD-10-CM

## 2019-01-31 MED ORDER — GADOBUTROL 1 MMOL/ML IV SOLN: 7.0000 mL | Freq: Once | INTRAVENOUS | Status: DC | PRN

## 2019-01-31 MED ORDER — LORAZEPAM 0.5 MG PO TABS: 0.5000 mg | ORAL_TABLET | Freq: Once | ORAL | 0 refills | Status: AC

## 2019-01-31 NOTE — Telephone Encounter (Signed)
Returned call and pt states that Dr Roselie Awkward increased megace on 01-26-19, bleeding has slowed down some but still in a lot of pain rating 7.5/10 despite taking tylenol.. Pt would like something for pain until next appt. Advised that I would message provider.

## 2019-01-31 NOTE — Telephone Encounter (Signed)
LM for Ms Rebeck stating that her MRI was r/s to 02-11-19 at 10 am [facility]. She needs to arrive at 0930 NPO 4 hrs prior to MRI. She needs to p/u prescription of ativan to take 1 hour prior to scan.  She will need transportation. Requested that she call to confirm receipt of message.

## 2019-01-31 NOTE — Progress Notes (Signed)
One time dose of Ativan sent to pts pharm after patient was unable to proceed with MRI today due to claustrophobia.  Patient has been instructed not to drive after taking the medication on the day of her rescheduled MRI.

## 2019-02-01 NOTE — Telephone Encounter (Signed)
Confirmed MRI appointment and directions with patient as noted below.

## 2019-02-02 ENCOUNTER — Ambulatory Visit (HOSPITAL_COMMUNITY): Payer: Medicaid Other

## 2019-02-02 ENCOUNTER — Encounter (HOSPITAL_COMMUNITY): Payer: Medicaid Other

## 2019-02-03 ENCOUNTER — Other Ambulatory Visit: Payer: Self-pay | Admitting: Obstetrics & Gynecology

## 2019-02-03 DIAGNOSIS — N920 Excessive and frequent menstruation with regular cycle: Secondary | ICD-10-CM

## 2019-02-03 MED ORDER — DICLOFENAC SODIUM 75 MG PO TBEC: 75.0000 mg | DELAYED_RELEASE_TABLET | Freq: Two times a day (BID) | ORAL | 2 refills | Status: DC

## 2019-02-03 NOTE — Progress Notes (Signed)
Prescription for Voltaren sent to pharmacy

## 2019-02-08 ENCOUNTER — Telehealth: Payer: Self-pay

## 2019-02-08 ENCOUNTER — Other Ambulatory Visit: Payer: Medicaid Other | Admitting: Obstetrics and Gynecology

## 2019-02-08 NOTE — Telephone Encounter (Signed)
Pt called and reports that she cannot take the medication that was sent for her per her gastroenterologist. Pt reports she has bleeding hemorrhoids and cannot take NSAIDS. Pt would like to know if there's something else she can take.

## 2019-02-08 NOTE — Telephone Encounter (Signed)
S/w pt and advised that provider sent a prescription to the pharmacy.

## 2019-02-09 ENCOUNTER — Encounter (HOSPITAL_COMMUNITY): Payer: Medicaid Other

## 2019-02-10 ENCOUNTER — Encounter: Payer: Self-pay | Admitting: Family Medicine

## 2019-02-10 ENCOUNTER — Ambulatory Visit: Payer: Medicaid Other | Attending: Family Medicine | Admitting: Family Medicine

## 2019-02-10 ENCOUNTER — Other Ambulatory Visit: Payer: Self-pay

## 2019-02-10 VITALS — BP 95/63 | HR 90 | Temp 98.2°F | Ht 62.0 in | Wt 157.0 lb

## 2019-02-10 DIAGNOSIS — R319 Hematuria, unspecified: Secondary | ICD-10-CM | POA: Diagnosis not present

## 2019-02-10 DIAGNOSIS — Z791 Long term (current) use of non-steroidal anti-inflammatories (NSAID): Secondary | ICD-10-CM | POA: Diagnosis not present

## 2019-02-10 DIAGNOSIS — D500 Iron deficiency anemia secondary to blood loss (chronic): Secondary | ICD-10-CM | POA: Diagnosis not present

## 2019-02-10 DIAGNOSIS — R590 Localized enlarged lymph nodes: Secondary | ICD-10-CM | POA: Diagnosis not present

## 2019-02-10 DIAGNOSIS — R109 Unspecified abdominal pain: Secondary | ICD-10-CM | POA: Diagnosis present

## 2019-02-10 DIAGNOSIS — K644 Residual hemorrhoidal skin tags: Secondary | ICD-10-CM | POA: Insufficient documentation

## 2019-02-10 DIAGNOSIS — R108A3 Suprapubic tenderness: Secondary | ICD-10-CM

## 2019-02-10 DIAGNOSIS — R10819 Abdominal tenderness, unspecified site: Secondary | ICD-10-CM | POA: Diagnosis not present

## 2019-02-10 DIAGNOSIS — N920 Excessive and frequent menstruation with regular cycle: Secondary | ICD-10-CM | POA: Diagnosis not present

## 2019-02-10 DIAGNOSIS — Z87891 Personal history of nicotine dependence: Secondary | ICD-10-CM | POA: Diagnosis not present

## 2019-02-10 LAB — POCT URINALYSIS DIP (CLINITEK)
Bilirubin, UA: NEGATIVE
Glucose, UA: NEGATIVE mg/dL
Ketones, POC UA: NEGATIVE mg/dL
Leukocytes, UA: NEGATIVE
Nitrite, UA: NEGATIVE
POC PROTEIN,UA: NEGATIVE
Spec Grav, UA: >=1.03 — AB
Urobilinogen, UA: 0.2 U/dL
pH, UA: 6

## 2019-02-10 MED ORDER — AMOXICILLIN-POT CLAVULANATE 500-125 MG PO TABS: 1.0000 | ORAL_TABLET | Freq: Two times a day (BID) | ORAL | 0 refills | Status: DC

## 2019-02-10 MED ORDER — HYDROCODONE-ACETAMINOPHEN 5-325MG PREPACK (~~LOC~~: ORAL_TABLET | ORAL | 0 refills | Status: DC

## 2019-02-10 NOTE — Progress Notes (Signed)
Established Patient Office Visit  Subjective:  Patient ID: Leah Baker, female    [name]: [birth year]  Age: 38 y.o. MRN: [id number]  CC:  Chief Complaint  Patient presents with  . Abdominal Pain    HPI Leah Baker presents for the complaint of continued bleeding after an endometrial biopsy for menorrhagia and enlarged right inguinal lymph node. Patient reports that GYN asked to her increase her megace dose and later called in Diclofenac to help with cramping but she cannot take this due to anemia.  She reports that she is taking an iron supplement 4 times daily to help with anemia.  She denies current constipation associated with iron use as she states that stools have been looser since she increase her dose of Megace.  She denies any blood in the stool but has had some recent issues with painful external hemorrhoids for which she has been using over-the-counter Preparation H.  Per patient she has to carry a heating pad with her to help with pain which is mostly related to an enlarged lymph node in her right groin area.  She scheduled for a MRI but she to get this rescheduled because she could not tolerate the pain of lying on the table for 40 minutes. Pain is in the inguinal area and a 7.5 on a 0-10 scale.  She reports that her MRI has been rescheduled.  Patient did have prior MRI in March of this year which showed an enlarged lymph node and she was admitted to the hospital in June 2020 due to heavy menstrual bleeding, rectal bleeding which was found to be due to her hemorrhoids (internal per chart) and anemia.  Patient received blood transfusion during the hospitalization.  Also review of chart, patient had transvaginal ultrasound on December 16, 2018 which showed a small solid mass adjacent to the right ovary which was thought to be an enlarged right external iliac lymph node adjacent to the right ovary and a core biopsy from April 2020 was revealing for lymphoid hyperplasia with flow cytometry  negative for lymphoma.       She currently reports fatigue due to the recurrent bleeding/anemia, chronic issues with pain in the right groin which is causing her to have interruptions in sleep and patient has a daughter in second grade and a daughter entering her senior year of high school.  Patient is stressed over responsibilities of home schooling and patient is also currently unemployed.  She denies any issues with suicidal thoughts or ideations but does feel anxious about her health.  On additional review of systems, she denies any current issues with headaches or dizziness, no chest pain or palpitations and no shortness of breath or cough.  She feels as if she might have occasional peripheral edema in her lower legs/ankles.  She denies any fever or chills.  She denies any urinary frequency, urgency or dysuria.  Past Medical History:  Diagnosis Date  . Acute costochondritis 11/08/2018   anterior chest wall pain  per pt pcp note in epic on 11-10-2018  . Costochondritis, acute 11/10/2018  . Depression   . Hemorrhoids   . History of lower GI bleeding 08/31/2018   s/p  colonoscopy 09-01-2018,  hemorrhoids  . IDA (iron deficiency anemia)   . Inguinal lymphadenopathy    CT 09-14-2018  right side, followed by pcp  . PONV (postoperative nausea and vomiting)    only happened once  . Wears glasses     Past Surgical History:  Procedure Laterality  Date  . BIOPSY  09/01/2018   Procedure: BIOPSY;  Surgeon: Otis Brace, MD;  Location: [facility];  Service: Gastroenterology;;  . CESAREAN SECTION  01/15/2002   @[facility]   . CESAREAN SECTION  03/17/2011   Procedure: CESAREAN SECTION;  Surgeon: Osborne Oman, MD;  Location: [facility];  Service: Gynecology;  Laterality: N/A;  . COLONOSCOPY WITH PROPOFOL N/A 09/01/2018   Procedure: COLONOSCOPY WITH PROPOFOL;  Surgeon: Otis Brace, MD;  Location: [facility];  Service: Gastroenterology;  Laterality: N/A;  . HYSTEROSCOPY W/D&C  06-21-2009  dr constant  @[facility]    w/  REMOVAL IUD   . IUD REMOVAL  06/21/2009  . KNEE ARTHROSCOPY WITH ANTERIOR CRUCIATE LIGAMENT (ACL) REPAIR Left 09-08-2013   dr [name]  @[facility]    w/  meniscal repair  . MULTIPLE TOOTH EXTRACTIONS    . RECTAL EXAM UNDER ANESTHESIA N/A 11/18/2018   Procedure: ANORECTAL EXAM UNDER ANESTHESIA;  Surgeon: Ileana Roup, MD;  Location: [facility];  Service: General;  Laterality: N/A;  . TUBAL LIGATION Bilateral 2012    Family History  Problem Relation Age of Onset  . Cancer Mother   . Kidney failure Mother   . Healthy Father     Social History   Socioeconomic History  . Marital status: Single    Spouse name: Not on file  . Number of children: Not on file  . Years of education: Not on file  . Highest education level: Not on file  Occupational History  . Not on file  Social Needs  . Financial resource strain: Not on file  . Food insecurity    Worry: Not on file    Inability: Not on file  . Transportation needs    Medical: Not on file    Non-medical: Not on file  Tobacco Use  . Smoking status: Former Smoker    Years: 5.00    Types: Cigarettes    Quit date: 08/22/2008    Years since quitting: 10.4  . Smokeless tobacco: Never Used  Substance and Sexual Activity  . Alcohol use: Yes    Comment: occasionally  . Drug use: Not Currently    Comment: per pt younger in college marijuana, none since  . Sexual activity: Yes    Birth control/protection: Surgical  Lifestyle  . Physical activity    Days per week: Not on file    Minutes per session: Not on file  . Stress: Not on file  Relationships  . Social Herbalist on phone: Not on file    Gets together: Not on file    Attends religious service: Not on file    Active member of club or organization: Not on file    Attends meetings of clubs or organizations: Not on file    Relationship status: Not on file  . Intimate partner violence    Fear of current or ex partner: Not on file     Emotionally abused: Not on file    Physically abused: Not on file    Forced sexual activity: Not on file  Other Topics Concern  . Not on file  Social History Narrative  . Not on file    Outpatient Medications Prior to Visit  Medication Sig Dispense Refill  . ferrous sulfate 325 (65 FE) MG EC tablet Take 1 tablet (325 mg total) by mouth 2 (two) times a day. (Patient taking differently: Take 325 mg by mouth 4 (four) times daily. ) 90 tablet 3  . megestrol (  MEGACE) 40 MG tablet Take 2 tablets (80 mg total) by mouth 2 (two) times daily. 90 tablet 3  . Multiple Vitamins-Minerals (WOMENS MULTI VITAMIN & MINERAL PO) Take 1 tablet by mouth daily.    Marland Kitchen acetaminophen (TYLENOL) 325 MG tablet Take 2 tablets (650 mg total) by mouth every 6 (six) hours as needed for mild pain (or Fever >/= 101). (Patient not taking: Reported on 02/10/2019)    . diclofenac (VOLTAREN) 75 MG EC tablet Take 1 tablet (75 mg total) by mouth 2 (two) times daily with a meal. (Patient not taking: Reported on 02/10/2019) 60 tablet 2  . ferumoxytol (FERAHEME) 510 MG/17ML SOLN injection Inject 17 mLs (510 mg total) into the vein every 14 (fourteen) days for 3 doses. (Patient not taking: Reported on 02/10/2019) 51 mL 0  . psyllium (HYDROCIL/METAMUCIL) 95 % PACK Take 1 packet by mouth daily.     No facility-administered medications prior to visit.     No Known Allergies  ROS Review of Systems  Constitutional: Positive for fatigue. Negative for chills and fever.  HENT: Negative for sore throat and trouble swallowing.   Eyes: Negative for photophobia and visual disturbance (She does wear glasses for visual correction).  Respiratory: Negative for cough and shortness of breath.   Cardiovascular: Negative for chest pain and palpitations.  Gastrointestinal: Negative for abdominal pain, blood in stool, constipation, diarrhea and nausea.  Endocrine: Positive for cold intolerance (Occasional). Negative for heat intolerance, polydipsia,  polyphagia and polyuria.  Genitourinary: Positive for menstrual problem and vaginal bleeding. Negative for dysuria, flank pain, frequency and pelvic pain.  Musculoskeletal: Positive for arthralgias (recurrent knee pain). Negative for back pain and gait problem.  Skin: Negative for rash and wound.  Neurological: Negative for dizziness, light-headedness and headaches.  Hematological: Positive for adenopathy. Does not bruise/bleed easily.  Psychiatric/Behavioral: Positive for sleep disturbance. Negative for self-injury and suicidal ideas. The patient is nervous/anxious.       Objective:    Physical Exam  Constitutional: She is oriented to person, place, and time. She appears well-developed and well-nourished.  WNWD female in NAD who appears fatigued versus flattened affect; wearing knee sleeve to the right knee  Neck: Normal range of motion. Neck supple. No JVD present. Thyromegaly present.  Cardiovascular: Normal rate and regular rhythm.  Pulmonary/Chest: Effort normal and breath sounds normal.  Abdominal: Soft. She exhibits distension. There is abdominal tenderness. There is no rebound and no guarding.  Mild abdominal distension; suprapubic tenderness to palpation  Genitourinary:    Genitourinary Comments: Tender right inguinal enlarged lymph node about the size of a peanut M&M but node is compressible. Also very pea sized small left inguinal node that is tender   Musculoskeletal:     Comments: No CVA tenderness  Lymphadenopathy:    She has no cervical adenopathy.  Neurological: She is alert and oriented to person, place, and time.  Skin: Skin is warm and dry. No rash noted.  Psychiatric: Her behavior is normal.  Slightly flattened affect and anxious but appeared less anxious by the end of the visit  Nursing note and vitals reviewed.   BP 95/63   Pulse 90   Temp 98.2 F (36.8 C) (Oral)   Ht 5\' 2"  (1.575 m)   Wt 157 lb (71.2 kg)   SpO2 100%   BMI 28.72 kg/m  Wt Readings from  Last 3 Encounters:  02/10/19 157 lb (71.2 kg)  01/21/19 154 lb 12.8 oz (70.2 kg)  01/18/19 154 lb 3.2 oz (69.9  kg)     Health Maintenance Due  Topic Date Due  . MAMMOGRAM  09/22/1996    There are no preventive care reminders to display for this patient.  Lab Results  Component Value Date   TSH 1.804 05/03/2014   Lab Results  Component Value Date   WBC 8.0 01/04/2019   HGB 12.7 01/04/2019   HCT 38.5 01/04/2019   MCV 81.7 01/04/2019   PLT 350 01/04/2019   Lab Results  Component Value Date   NA 138 12/08/2018   K 4.4 12/08/2018   CO2 18 (L) 12/08/2018   GLUCOSE 89 12/08/2018   BUN 9 12/08/2018   CREATININE 0.81 12/08/2018   BILITOT 0.2 (L) 11/26/2018   ALKPHOS 43 11/26/2018   AST 20 11/26/2018   ALT 17 11/26/2018   PROT 6.8 11/26/2018   ALBUMIN 3.8 11/26/2018   CALCIUM 9.3 12/08/2018   ANIONGAP 7 11/27/2018   No results found for: CHOL No results found for: HDL No results found for: LDLCALC No results found for: TRIG No results found for: CHOLHDL No results found for: HGBA1C    Assessment & Plan:  1. Inguinal lymphadenopathy Patient with inguinal lymphadenopathy for which she has been evaluated by GYN.  She reports that she could not tolerate lying still to have recently ordered MRI done and this is been rescheduled.  Patient will have CBC to look for elevated white blood cell count related to her inguinal lymphadenopathy.  Patient will be placed on a trial of Augmentin 500 mg twice daily x10 days which she is to take after eating and she is also encouraged to eat yogurt with active cultures while on the medication to help prevent diarrhea.  Patient also shaves her pubic area and lymphadenopathy could be reactive.  Patient given prescription for hydrocodone APAP 5/325 to help with pain related to inguinal lymphadenopathy and she will be able to take the medication prior to MRI so that she will be able to lie comfortably on the table to have this done.  Patient will  follow-up with GYN regarding her inguinal lymphadenopathy, and menorrhagia. - CBC with Differential - amoxicillin-clavulanate (AUGMENTIN) 500-125 MG tablet; Take 1 tablet (500 mg total) by mouth 2 (two) times daily. Take after eating  Dispense: 20 tablet; Refill: 0 - HYDROcodone-acetaminophen (VICODIN) 5-325 mg TABS tablet; 1/2 or one every 6-8 hours as needed for pain; take after eating  Dispense: 15 tablet; Refill: 0  2. Menorrhagia with regular cycle; 3.  Iron deficiency anemia due to chronic blood loss/menorrhagia  patient has had endometrial biopsy per GYN in follow-up of her menorrhagia and is currently on Megace but reports continued episodes of bleeding/spotting.  Patient will have CBC in follow-up of anemia as patient with need for transfusion earlier this year secondary to anemia from chronic blood loss.  Patient reports that she is currently taking iron supplement 4 times daily.  Based on her CBC results today, she may be able to decrease to twice daily which may help with some subsequent GI issues related to her iron intake.  Her GYN notes were reviewed and patient does not wish to have hysterectomy if possible.  Patient is encouraged to keep scheduled follow-up for MRI and follow-up of her inguinal lymphadenopathy and menorrhagia and keep GYN follow-up. - CBC with Differential  4. Suprapubic tenderness Patient with suprapubic tenderness on examination.  Patient was asked to give sample for urinalysis to see if she might have evidence of urinary tract infection.  Patient with hematuria  on urinalysis but is currently having issues with bleeding/spotting status post endometrial biopsy and issues with menorrhagia.  Patient is being prescribed Augmentin for her inguinal lymphadenopathy which would likely also cover her urinary tract infection.  Patient is to keep appointment for MRI of the pelvis which is been ordered by GYN.  Patient will be seen in follow-up here in the office in 2 weeks but  should call sooner if she has any worsening of her abdominal discomfort, fever or chills or any other concerns. - POCT URINALYSIS DIP (CLINITEK)  An After Visit Summary was printed and given to the patient.  Follow-up: Return in about 2 weeks (around 02/24/2019).  Antony Blackbird, MD

## 2019-02-10 NOTE — Progress Notes (Signed)
Patient is having cramping.  Patient had a procedure done and states that she is still bleeding.

## 2019-02-11 ENCOUNTER — Ambulatory Visit (HOSPITAL_COMMUNITY): Payer: Medicaid Other

## 2019-02-11 LAB — CBC WITH DIFFERENTIAL/PLATELET
Basophils Absolute: 0.1 x10E3/uL (ref 0.0–0.2)
Basos: 1 %
EOS (ABSOLUTE): 0.2 x10E3/uL (ref 0.0–0.4)
Eos: 2 %
Hematocrit: 38.9 % (ref 34.0–46.6)
Hemoglobin: 12.6 g/dL (ref 11.1–15.9)
Immature Grans (Abs): 0 x10E3/uL (ref 0.0–0.1)
Immature Granulocytes: 0 %
Lymphocytes Absolute: 2.6 x10E3/uL (ref 0.7–3.1)
Lymphs: 37 %
MCH: 27.5 pg (ref 26.6–33.0)
MCHC: 32.4 g/dL (ref 31.5–35.7)
MCV: 85 fL (ref 79–97)
Monocytes Absolute: 0.4 x10E3/uL (ref 0.1–0.9)
Monocytes: 6 %
Neutrophils Absolute: 3.7 x10E3/uL (ref 1.4–7.0)
Neutrophils: 54 %
Platelets: 457 x10E3/uL — ABNORMAL HIGH (ref 150–450)
RBC: 4.59 x10E6/uL (ref 3.77–5.28)
RDW: 18.3 % — ABNORMAL HIGH (ref 11.7–15.4)
WBC: 7 x10E3/uL (ref 3.4–10.8)

## 2019-02-14 ENCOUNTER — Telehealth: Payer: Self-pay | Admitting: Hematology

## 2019-02-14 NOTE — Telephone Encounter (Signed)
Received a new patient referral from Dr. Denman George for Leah Baker to see Dr. Irene Limbo for pelvic lymphadenopathy. Leah Baker has been cld and scheduled ot see Dr. Irene Limbo on 9/28 at 11am. She's been made aware to arrive 15 minutes early.

## 2019-02-16 ENCOUNTER — Encounter (HOSPITAL_COMMUNITY): Payer: Medicaid Other

## 2019-02-16 ENCOUNTER — Other Ambulatory Visit: Payer: Medicaid Other | Admitting: Obstetrics & Gynecology

## 2019-02-22 ENCOUNTER — Ambulatory Visit (HOSPITAL_COMMUNITY)
Admission: RE | Admit: 2019-02-22 | Discharge: 2019-02-22 | Disposition: A | Discharge status: Home / Self Care | Payer: Medicaid Other | Source: Ambulatory Visit | Attending: Gynecologic Oncology | Admitting: Gynecologic Oncology

## 2019-02-22 ENCOUNTER — Other Ambulatory Visit: Payer: Self-pay

## 2019-02-22 DIAGNOSIS — R590 Localized enlarged lymph nodes: Secondary | ICD-10-CM | POA: Insufficient documentation

## 2019-02-22 DIAGNOSIS — N949 Unspecified condition associated with female genital organs and menstrual cycle: Secondary | ICD-10-CM | POA: Insufficient documentation

## 2019-02-22 DIAGNOSIS — N920 Excessive and frequent menstruation with regular cycle: Secondary | ICD-10-CM | POA: Insufficient documentation

## 2019-02-22 MED ORDER — GADOBUTROL 1 MMOL/ML IV SOLN
7.0000 mL | Freq: Once | INTRAVENOUS | Status: AC | PRN
  Administered 2019-02-22: 09:00:00 7 mL via INTRAVENOUS

## 2019-02-24 ENCOUNTER — Encounter: Payer: Self-pay | Admitting: Family Medicine

## 2019-02-24 ENCOUNTER — Ambulatory Visit: Payer: Medicaid Other | Attending: Family Medicine | Admitting: Family Medicine

## 2019-02-24 ENCOUNTER — Other Ambulatory Visit: Payer: Self-pay

## 2019-02-24 VITALS — BP 107/80 | HR 108 | Temp 99.3°F | Ht 62.0 in | Wt 158.6 lb

## 2019-02-24 DIAGNOSIS — D500 Iron deficiency anemia secondary to blood loss (chronic): Secondary | ICD-10-CM | POA: Insufficient documentation

## 2019-02-24 DIAGNOSIS — E010 Iodine-deficiency related diffuse (endemic) goiter: Secondary | ICD-10-CM

## 2019-02-24 DIAGNOSIS — F411 Generalized anxiety disorder: Secondary | ICD-10-CM | POA: Diagnosis not present

## 2019-02-24 DIAGNOSIS — R590 Localized enlarged lymph nodes: Secondary | ICD-10-CM

## 2019-02-24 DIAGNOSIS — F430 Acute stress reaction: Secondary | ICD-10-CM

## 2019-02-24 DIAGNOSIS — R8290 Unspecified abnormal findings in urine: Secondary | ICD-10-CM

## 2019-02-24 DIAGNOSIS — R000 Tachycardia, unspecified: Secondary | ICD-10-CM

## 2019-02-24 DIAGNOSIS — Z792 Long term (current) use of antibiotics: Secondary | ICD-10-CM | POA: Insufficient documentation

## 2019-02-24 DIAGNOSIS — Z809 Family history of malignant neoplasm, unspecified: Secondary | ICD-10-CM | POA: Diagnosis not present

## 2019-02-24 DIAGNOSIS — Z79899 Other long term (current) drug therapy: Secondary | ICD-10-CM | POA: Insufficient documentation

## 2019-02-24 DIAGNOSIS — Z87891 Personal history of nicotine dependence: Secondary | ICD-10-CM | POA: Diagnosis not present

## 2019-02-24 DIAGNOSIS — N920 Excessive and frequent menstruation with regular cycle: Secondary | ICD-10-CM | POA: Diagnosis not present

## 2019-02-24 DIAGNOSIS — R319 Hematuria, unspecified: Secondary | ICD-10-CM | POA: Diagnosis not present

## 2019-02-24 LAB — POCT URINALYSIS DIP (CLINITEK)
Bilirubin, UA: NEGATIVE
Glucose, UA: NEGATIVE mg/dL
Ketones, POC UA: NEGATIVE mg/dL
Leukocytes, UA: NEGATIVE
Nitrite, UA: NEGATIVE
POC PROTEIN,UA: NEGATIVE
Spec Grav, UA: >=1.03 — AB
Urobilinogen, UA: 0.2 U/dL
pH, UA: 6

## 2019-02-24 MED ORDER — ALPRAZOLAM 0.5 MG PO TBDP: ORAL_TABLET | ORAL | 0 refills | Status: DC

## 2019-02-24 MED ORDER — SERTRALINE HCL 50 MG PO TABS: 50.0000 mg | ORAL_TABLET | Freq: Every day | ORAL | 3 refills | Status: DC

## 2019-02-24 NOTE — Progress Notes (Signed)
Established Patient Office Visit  Subjective:  Patient ID: Leah Baker, female    [name]: [birth year]  Age: 38 y.o. MRN: [id number]  CC: follow-up from visit on 02/10/2019  HPI Leah Baker presents for follow-up of menorrhagia, inguinal lymphadenopathy, iron deficiency anemia due to chronic blood loss and had UA with hematuria and suprapubic tenderness on exam but also may have been on her menses at the time of UA. She was prescribed Augmentin for LAD which would also cover a UTI and returns today for follow-up. Patient has had MRI on review of chart since her last visit. MRI on 02/22/2019 showed continued pelvic/inguinal LAD which raises the question of a possible lymphoproliferative disorder and it appears that patient has appointment scheduled with hematology oncology on 02/28/2019.  Patient's most recent CBC at her visit on 02/10/2019 showed elevated platelets but hemoglobin was within normal at 12.6.      At today's visit, patient states that she is feeling overwhelmed.  Her father recently had surgery for removal of a pituitary tumor and was doing well but then last week developed chest pain and had to be taken to the hospital and he is now in intensive care and family has been told that his heart is weak and he will need a heart transplant and that is his only hope for survival.  Patient meanwhile has been referred to hematology oncology and has appointment this Monday regarding her inguinal lymphadenopathy.  Patient states that over the past few nights she is started having episodes of waking up being drenched in sweat.  She continues to have difficulty sleeping.  She feels very stressed and is having episodes of tearfulness with everything that is going on.  She states that she did take medicine in the past for anxiety and depression but has not been on any medication in a while but feels that she does need to be on something at this time.        Patient denies any current urinary symptoms, no  dysuria, no frequency.  She denies any mid back pain and no lower abdominal pain.  Patient is no longer on her menses at this time.  She has noticed some bleeding with wiping after bowel movement secondary to hemorrhoids.  She denies any headache other than one episode of headache after she had been crying.  No dizziness.  No sensation of fever or chills.  No shortness of breath or cough.  No chest pain or palpitations.  She does feel fatigued.  She denies any focal numbness or weakness.  No nausea/vomiting or diarrhea.  No current abdominal pain.  No current muscle or joint pain.  No changes in vision, no nasal congestion, no sore throat and no difficulty swallowing.  Past Medical History:  Diagnosis Date  . Acute costochondritis 11/08/2018   anterior chest wall pain  per pt pcp note in epic on 11-10-2018  . Costochondritis, acute 11/10/2018  . Depression   . Hemorrhoids   . History of lower GI bleeding 08/31/2018   s/p  colonoscopy 09-01-2018,  hemorrhoids  . IDA (iron deficiency anemia)   . Inguinal lymphadenopathy    CT 09-14-2018  right side, followed by pcp  . PONV (postoperative nausea and vomiting)    only happened once  . Wears glasses     Past Surgical History:  Procedure Laterality Date  . BIOPSY  09/01/2018   Procedure: BIOPSY;  Surgeon: Otis Brace, MD;  Location: [facility];  Service: Gastroenterology;;  .  CESAREAN SECTION  01/15/2002   @[facility]   . CESAREAN SECTION  03/17/2011   Procedure: CESAREAN SECTION;  Surgeon: Osborne Oman, MD;  Location: [facility];  Service: Gynecology;  Laterality: N/A;  . COLONOSCOPY WITH PROPOFOL N/A 09/01/2018   Procedure: COLONOSCOPY WITH PROPOFOL;  Surgeon: Otis Brace, MD;  Location: [facility];  Service: Gastroenterology;  Laterality: N/A;  . HYSTEROSCOPY W/D&C  06-21-2009  dr constant @[facility]    w/  REMOVAL IUD   . IUD REMOVAL  06/21/2009  . KNEE ARTHROSCOPY WITH ANTERIOR CRUCIATE LIGAMENT (ACL) REPAIR Left 09-08-2013   dr [name]  @[facility]      w/  meniscal repair  . MULTIPLE TOOTH EXTRACTIONS    . RECTAL EXAM UNDER ANESTHESIA N/A 11/18/2018   Procedure: ANORECTAL EXAM UNDER ANESTHESIA;  Surgeon: Ileana Roup, MD;  Location: [facility];  Service: General;  Laterality: N/A;  . TUBAL LIGATION Bilateral 2012    Family History  Problem Relation Age of Onset  . Cancer Mother   . Kidney failure Mother   . Healthy Father     Social History   Socioeconomic History  . Marital status: Single    Spouse name: Not on file  . Number of children: Not on file  . Years of education: Not on file  . Highest education level: Not on file  Occupational History  . Not on file  Social Needs  . Financial resource strain: Not on file  . Food insecurity    Worry: Not on file    Inability: Not on file  . Transportation needs    Medical: Not on file    Non-medical: Not on file  Tobacco Use  . Smoking status: Former Smoker    Years: 5.00    Types: Cigarettes    Quit date: 08/22/2008    Years since quitting: 10.5  . Smokeless tobacco: Never Used  Substance and Sexual Activity  . Alcohol use: Yes    Comment: occasionally  . Drug use: Not Currently    Comment: per pt younger in college marijuana, none since  . Sexual activity: Yes    Birth control/protection: Surgical  Lifestyle  . Physical activity    Days per week: Not on file    Minutes per session: Not on file  . Stress: Not on file  Relationships  . Social Herbalist on phone: Not on file    Gets together: Not on file    Attends religious service: Not on file    Active member of club or organization: Not on file    Attends meetings of clubs or organizations: Not on file    Relationship status: Not on file  . Intimate partner violence    Fear of current or ex partner: Not on file    Emotionally abused: Not on file    Physically abused: Not on file    Forced sexual activity: Not on file  Other Topics Concern  . Not on file  Social  History Narrative  . Not on file    Outpatient Medications Prior to Visit  Medication Sig Dispense Refill  . acetaminophen (TYLENOL) 325 MG tablet Take 2 tablets (650 mg total) by mouth every 6 (six) hours as needed for mild pain (or Fever >/= 101). (Patient not taking: Reported on 02/10/2019)    . amoxicillin-clavulanate (AUGMENTIN) 500-125 MG tablet Take 1 tablet (500 mg total) by mouth 2 (two) times daily. Take after eating 20 tablet 0  . diclofenac (VOLTAREN)  75 MG EC tablet Take 1 tablet (75 mg total) by mouth 2 (two) times daily with a meal. (Patient not taking: Reported on 02/10/2019) 60 tablet 2  . ferrous sulfate 325 (65 FE) MG EC tablet Take 1 tablet (325 mg total) by mouth 2 (two) times a day. (Patient taking differently: Take 325 mg by mouth 4 (four) times daily. ) 90 tablet 3  . ferumoxytol (FERAHEME) 510 MG/17ML SOLN injection Inject 17 mLs (510 mg total) into the vein every 14 (fourteen) days for 3 doses. (Patient not taking: Reported on 02/10/2019) 51 mL 0  . HYDROcodone-acetaminophen (VICODIN) 5-325 mg TABS tablet 1/2 or one every 6-8 hours as needed for pain; take after eating 15 tablet 0  . megestrol (MEGACE) 40 MG tablet Take 2 tablets (80 mg total) by mouth 2 (two) times daily. 90 tablet 3  . Multiple Vitamins-Minerals (WOMENS MULTI VITAMIN & MINERAL PO) Take 1 tablet by mouth daily.    . psyllium (HYDROCIL/METAMUCIL) 95 % PACK Take 1 packet by mouth daily.     No facility-administered medications prior to visit.     No Known Allergies  ROS Review of Systems  Constitutional: Positive for fatigue. Negative for chills and fever.       Patient describes night sweats  HENT: Negative for sore throat and trouble swallowing.   Eyes: Negative for photophobia and visual disturbance.  Respiratory: Negative for cough and shortness of breath.   Cardiovascular: Negative for chest pain, palpitations and leg swelling.  Gastrointestinal: Negative for abdominal pain, constipation,  diarrhea and nausea.  Endocrine: Negative for cold intolerance, heat intolerance, polydipsia, polyphagia and polyuria.  Genitourinary: Positive for menstrual problem. Negative for dysuria, frequency and hematuria.  Musculoskeletal: Negative for arthralgias and back pain.  Skin: Negative for rash and wound.  Neurological: Negative for dizziness and headaches.  Hematological: Negative for adenopathy. Does not bruise/bleed easily.  Psychiatric/Behavioral: Positive for sleep disturbance. Negative for self-injury and suicidal ideas. The patient is nervous/anxious.       Objective:    Physical Exam  Constitutional: She is oriented to person, place, and time. She appears well-developed and well-nourished.  Well-nourished well-developed female in no acute distress per patient became tearful when talking about her health and her father's recent health issues  Neck: Normal range of motion. Neck supple. No JVD present. Thyromegaly present.  Cardiovascular: Regular rhythm.  Mild tachycardia on exam  Pulmonary/Chest: Effort normal and breath sounds normal.  Abdominal: Soft. There is no abdominal tenderness. There is no rebound and no guarding.  Musculoskeletal: Normal range of motion.        General: No tenderness or edema.     Comments: No CVA tenderness  Lymphadenopathy:    She has no cervical adenopathy.  Neurological: She is alert and oriented to person, place, and time.  Skin: Skin is warm and dry.  Psychiatric: Judgment and thought content normal.  Patient appears tearful at times but was able to compose herself easily.  Patient also appeared anxious at times  Nursing note and vitals reviewed.   BP 107/80 (BP Location: Right Arm, Patient Position: Sitting, Cuff Size: Large)   Pulse (!) 108   Temp 99.3 F (37.4 C) (Oral)   Ht 5\' 2"  (1.575 m)   Wt 158 lb 9.6 oz (71.9 kg)   SpO2 97%   BMI 29.01 kg/m  Wt Readings from Last 3 Encounters:  02/10/19 157 lb (71.2 kg)  01/21/19 154 lb  12.8 oz (70.2 kg)  01/18/19 154 lb  3.2 oz (69.9 kg)     Health Maintenance Due  Topic Date Due  . MAMMOGRAM  09/22/1996  . INFLUENZA VACCINE  01/01/2019    -Patient was offered but declined influenza immunization at today's visit  Lab Results  Component Value Date   TSH 1.804 05/03/2014   Lab Results  Component Value Date   WBC 7.0 02/10/2019   HGB 12.6 02/10/2019   HCT 38.9 02/10/2019   MCV 85 02/10/2019   PLT 457 (H) 02/10/2019   Lab Results  Component Value Date   NA 138 12/08/2018   K 4.4 12/08/2018   CO2 18 (L) 12/08/2018   GLUCOSE 89 12/08/2018   BUN 9 12/08/2018   CREATININE 0.81 12/08/2018   BILITOT 0.2 (L) 11/26/2018   ALKPHOS 43 11/26/2018   AST 20 11/26/2018   ALT 17 11/26/2018   PROT 6.8 11/26/2018   ALBUMIN 3.8 11/26/2018   CALCIUM 9.3 12/08/2018   ANIONGAP 7 11/27/2018   No results found for: CHOL No results found for: HDL No results found for: LDLCALC No results found for: TRIG No results found for: CHOLHDL No results found for: HGBA1C    Assessment & Plan:  1. Inguinal lymphadenopathy Patient status post MRI ordered by OB/GYN in follow-up of her inguinal lymphadenopathy and MRI findings per raise question of possible lymphoproliferative disorder.  Patient is aware and she has an appointment with hematology oncology this Monday.  Patient also reports recent onset of waking up drenched in sweat at night/night sweats.  2. Menorrhagia with regular cycle Patient is currently on Megace which is controlling her menorrhagia at this time.  Patient's most recent CBC did not show anemia.  She will continue follow-up with OB/GYN.  3. Abnormal urinalysis 4. Hematuria, unspecified type Urinalysis repeated at today's visit due to abnormal urinalysis at her last visit.  Patient with moderate blood on today's urinalysis and patient also with blood on urinalysis at her last visit.  Urine will be sent for culture to look for possible infection and patient is  also being referred to urology for further evaluation. - Urine Culture - Ambulatory referral to Urology -POCT Urinalysis 5. Tachycardia 6. Thyromegaly Patient with tachycardia on examination and patient had recent CBC which did not show current anemia and patient is on an iron supplement.  She will have T4 and TSH level done at today's visit to see if she may have hyperthyroidism as a contributing factor to her tachycardia as she also has some thyromegaly on examination. - T4 AND TSH  7. Anxiety as acute reaction to exceptional stress Patient is currently undergoing evaluation for possible malignancy and her father is currently in the ICU with acute heart failure and will need a heart transplant.  Patient also has 2 children with whom she is trying to help a virtual learning and patient is feeling anxious and overwhelmed.  Discussed anxiety with the patient and also provided educational material on living with anxiety as part of after visit summary.  Patient agrees to start sertraline/Zoloft taking half pill at bedtime for the first 5-7 nights then increasing to 50 mg daily.  She is aware that it may take 4 to 6 weeks for the medication to reach maximal effectiveness.  Patient is also being provided with a prescription for alprazolam 0.5 mg and she can take half tablet in the daytime every 8 hours as needed for acute anxiety and may take 1 tablet at bedtime if needed to help with sleeplessness secondary to anxiety.  Patient was also offered counseling which she will consider and she will call back if she does want to have this arranged.  Patient has appointment already scheduled in October and she will keep that appointment and follow-up.  She has also been asked to call or return sooner if she has any questions or concerns. - sertraline (ZOLOFT) 50 MG tablet; Take 1 tablet (50 mg total) by mouth at bedtime.  Dispense: 30 tablet; Refill: 3 - ALPRAZolam (NIRAVAM) 0.5 MG dissolvable tablet; Take 1/2 tablet  every 8 hours as needed for anxiety and may take 1 pill at bedtime as needed  Dispense: 20 tablet; Refill: 0   An After Visit Summary was printed and given to the patient.  Follow-up: Return for chronic issues/new meds-keep Oct appointment.   Antony Blackbird, MD

## 2019-02-24 NOTE — Patient Instructions (Signed)

## 2019-02-25 LAB — T4 AND TSH
T4, Total: 6 ug/dL (ref 4.5–12.0)
TSH: 1.48 u[IU]/mL (ref 0.450–4.500)

## 2019-02-26 LAB — URINE CULTURE

## 2019-02-26 NOTE — Progress Notes (Signed)
HEMATOLOGY/ONCOLOGY CONSULTATION NOTE  Date of Service: 02/26/2019  Patient Care Team: Elsie Stain, MD as PCP - General (Pulmonary Disease)  CHIEF COMPLAINTS/PURPOSE OF CONSULTATION:  Pelvic Lymphadenopathy  HISTORY OF PRESENTING ILLNESS:   Leah Baker is a wonderful 38 y.o. female who has been referred to [place] by Dr Denman George for evaluation and management of pelvic lymphadenopathy. The pt reports that she is doing well overall.   The pt reports that her pelvic lymphadenopathy was found incidentally while she was being checked for other abdominal pains. In February pt was seeing blood in her stools and had to be hospitalized due to severe blood loss. Pt's 04/20 Colonoscopy revealed that she did have internal hemorrhoids that were bleeding. Dr. Alessandra Bevels did not think that the pt needed an endoscopy because they believe that her internal hemorrhoid is the sole source of her GI bleeding. She also has a history of menorrhagia, for which she has had to have multiple blood transfusions. Dr. Denman George started her on Megace in July, her dosage has since been increased. Pt's pelvic region is sensitive to touch and the pain is constant. She reports that the pain is centralized in her hip and does not feel like it's going down her leg. Pt denies right leg swelling or injuries to the right side/leg. It does not hurt when she lifts her leg up, down or out. She does note mild pain when she moves her leg in, towards the center of her body. Heat helps ease the pain, so she often walks around with a heating pad and intercourse makes the pain worse. Dr. [name] has given her no indication of what is causing her pain. Pt also experiences non-radiating lower back pain that could be related. Pt had a chest CT in 11/2018 due to chest pains after she was moving heavy objects around but nothing of note was found. Her chest pains have since resolved. Pt reports some hematuria but denies any history of UTIs. She is  planning on seeing a Urologist soon. She has no family history of any autoimmune conditions but does have a large family history of cancer.   Pt can not take Ibuprofen due to bleeding concerns so she has had to take several different pain medications. She was last on Vicodin which helped but she has since run out. Dr. Denman George was trying to discern if most of her pain was due to her menstrual cramps and wanted to do an endometrial ablation but was told to wait until more is discovered about pt's inguinal lymphadenopathy. Pt has been taking Ferrous Sulfate 4x per day and Metamucil. Pt has no issues passing her bowels. Pt was given Amoxicillin & another antibiotic for about 7 days. She was given no reason why they were giving her antibiotics and it did not help her pain.   She reports that she has had a low-grade fever since Thursday and has had very bad night sweats for the past month. The night sweats appear to be due to the increase in her Megace dosing. She has not had any abnormal vaginal discharge or any boils or bumps in her right pelvic region. Pt has not noticed any lymph node growth or discomfort in the cervical region. She has been helping her children with their at-home schooling and has some new stress due to her father's medical concerns.   Of note prior to the patient's visit today, pt has had Pelvis MRI ([id number]) completed on 02/22/2019 with results revealing "Persistent right  inguinal and iliac adenopathy, previously biopsied and within 1-2 mm of size when compared to 09/14/2018. Significance uncertain given unilateral enlargement, unusual for pelvic nodes with reactive changes. Imaging appearance remains nonspecific but again raises the question of lymphoproliferative disorder. Correlate with previous biopsy results with imaging and clinical follow-up as warranted. Mass on ultrasound likely represented pelvic sidewall/external iliac nodal enlargement on the right."   Pt has had US Pelvic  ([id number]) completed on 12/16/2018 with results revealing "1. RIGHT adnexal mass, discrete from the RIGHT ovary is 1.9 centimeters and avascular on Doppler evaluation. This likely represents enlarged RIGHT external iliac lymph node as seen on previous CT exams of the abdomen and pelvis 09/14/2018 and 08/31/2018. 2. Given the persistence of RIGHT iliac lymph node enlargement, consider biopsy of RIGHT inguinal lymph node, seen on prior CT exam. 3. Normal endometrial thickness. If bleeding remains unresponsive to hormonal or medical therapy, sonohysterogram should be considered for focal lesion work-up. (Ref: Radiological Reasoning: Algorithmic  Workup of Abnormal Vaginal Bleeding with Endovaginal Sonography and Sonohysterography. AJR [id number])."  Pt has had Lymph node biopsy (right groin) [id number]) completed on 09/29/2018 with results revealing "LYMPHOID HYPERPLASIA."  Most recent lab results (02/10/2019) of CBC w/diff is as follows: all values are WNL except for RDW at 18.3, PLTs at 457K. 12/08/2018 BMP is as follows: all values are WNL except for CO2 at 18.   On review of systems, pt reports fevers, night sweats, pelvic pain and denies chills, SOB, coughing, runny nose, bloody stools, new lumps/bumps, abdominal pain, leg swelling, calf pain and any other symptoms.   On PMHx the pt reports menorrhagia, lower GI bleeding On Family Hx the pt reports her great aunt passed from liver cancer, her cousin currently has lung cancer, her mother had a mass in her breast (Removed)   MEDICAL HISTORY:  Past Medical History:  Diagnosis Date  . Acute costochondritis 11/08/2018   anterior chest wall pain  per pt pcp note in epic on 11-10-2018  . Costochondritis, acute 11/10/2018  . Depression   . Hemorrhoids   . History of lower GI bleeding 08/31/2018   s/p  colonoscopy 09-01-2018,  hemorrhoids  . IDA (iron deficiency anemia)   . Inguinal lymphadenopathy    CT 09-14-2018  right side, followed by  pcp  . PONV (postoperative nausea and vomiting)    only happened once  . Wears glasses     SURGICAL HISTORY: Past Surgical History:  Procedure Laterality Date  . BIOPSY  09/01/2018   Procedure: BIOPSY;  Surgeon: Otis Brace, MD;  Location: [facility];  Service: Gastroenterology;;  . CESAREAN SECTION  01/15/2002   @[facility]   . CESAREAN SECTION  03/17/2011   Procedure: CESAREAN SECTION;  Surgeon: Osborne Oman, MD;  Location: [facility];  Service: Gynecology;  Laterality: N/A;  . COLONOSCOPY WITH PROPOFOL N/A 09/01/2018   Procedure: COLONOSCOPY WITH PROPOFOL;  Surgeon: Otis Brace, MD;  Location: [facility];  Service: Gastroenterology;  Laterality: N/A;  . HYSTEROSCOPY W/D&C  06-21-2009  dr constant @[facility]    w/  REMOVAL IUD   . IUD REMOVAL  06/21/2009  . KNEE ARTHROSCOPY WITH ANTERIOR CRUCIATE LIGAMENT (ACL) REPAIR Left 09-08-2013   dr [name]  @[facility]    w/  meniscal repair  . MULTIPLE TOOTH EXTRACTIONS    . RECTAL EXAM UNDER ANESTHESIA N/A 11/18/2018   Procedure: ANORECTAL EXAM UNDER ANESTHESIA;  Surgeon: Ileana Roup, MD;  Location: [facility];  Service: General;  Laterality: N/A;  . TUBAL LIGATION Bilateral  2012    SOCIAL HISTORY: Social History   Socioeconomic History  . Marital status: Single    Spouse name: Not on file  . Number of children: Not on file  . Years of education: Not on file  . Highest education level: Not on file  Occupational History  . Not on file  Social Needs  . Financial resource strain: Not on file  . Food insecurity    Worry: Not on file    Inability: Not on file  . Transportation needs    Medical: Not on file    Non-medical: Not on file  Tobacco Use  . Smoking status: Former Smoker    Years: 5.00    Types: Cigarettes    Quit date: 08/22/2008    Years since quitting: 10.5  . Smokeless tobacco: Never Used  Substance and Sexual Activity  . Alcohol use: Yes    Comment: occasionally  . Drug use: Not Currently    Comment:  per pt younger in college marijuana, none since  . Sexual activity: Yes    Birth control/protection: Surgical  Lifestyle  . Physical activity    Days per week: Not on file    Minutes per session: Not on file  . Stress: Not on file  Relationships  . Social Herbalist on phone: Not on file    Gets together: Not on file    Attends religious service: Not on file    Active member of club or organization: Not on file    Attends meetings of clubs or organizations: Not on file    Relationship status: Not on file  . Intimate partner violence    Fear of current or ex partner: Not on file    Emotionally abused: Not on file    Physically abused: Not on file    Forced sexual activity: Not on file  Other Topics Concern  . Not on file  Social History Narrative  . Not on file    FAMILY HISTORY: Family History  Problem Relation Age of Onset  . Cancer Mother   . Kidney failure Mother   . Healthy Father     ALLERGIES:  has No Known Allergies.  MEDICATIONS:  Current Outpatient Medications  Medication Sig Dispense Refill  . acetaminophen (TYLENOL) 325 MG tablet Take 2 tablets (650 mg total) by mouth every 6 (six) hours as needed for mild pain (or Fever >/= 101). (Patient not taking: Reported on 02/10/2019)    . ALPRAZolam (NIRAVAM) 0.5 MG dissolvable tablet Take 1/2 tablet every 8 hours as needed for anxiety and may take 1 pill at bedtime as needed 20 tablet 0  . amoxicillin-clavulanate (AUGMENTIN) 500-125 MG tablet Take 1 tablet (500 mg total) by mouth 2 (two) times daily. Take after eating (Patient not taking: Reported on 02/24/2019) 20 tablet 0  . diclofenac (VOLTAREN) 75 MG EC tablet Take 1 tablet (75 mg total) by mouth 2 (two) times daily with a meal. (Patient not taking: Reported on 02/10/2019) 60 tablet 2  . ferrous sulfate 325 (65 FE) MG EC tablet Take 1 tablet (325 mg total) by mouth 2 (two) times a day. (Patient taking differently: Take 325 mg by mouth 4 (four) times daily. )  90 tablet 3  . ferumoxytol (FERAHEME) 510 MG/17ML SOLN injection Inject 17 mLs (510 mg total) into the vein every 14 (fourteen) days for 3 doses. (Patient not taking: Reported on 02/10/2019) 51 mL 0  . HYDROcodone-acetaminophen (VICODIN) 5-325 mg TABS tablet  1/2 or one every 6-8 hours as needed for pain; take after eating (Patient not taking: Reported on 02/24/2019) 15 tablet 0  . megestrol (MEGACE) 40 MG tablet Take 2 tablets (80 mg total) by mouth 2 (two) times daily. 90 tablet 3  . Multiple Vitamins-Minerals (WOMENS MULTI VITAMIN & MINERAL PO) Take 1 tablet by mouth daily.    . psyllium (HYDROCIL/METAMUCIL) 95 % PACK Take 1 packet by mouth daily.    . sertraline (ZOLOFT) 50 MG tablet Take 1 tablet (50 mg total) by mouth at bedtime. 30 tablet 3   No current facility-administered medications for this visit.     REVIEW OF SYSTEMS:    10 Point review of Systems was done is negative except as noted above.  PHYSICAL EXAMINATION: ECOG PERFORMANCE STATUS: 2 - Symptomatic, <50% confined to bed  . Vitals:   02/28/19 1139  BP: 118/83  Pulse: 79  Resp: 18  Temp: 98.5 F (36.9 C)  SpO2: 100%   Filed Weights   02/28/19 1139  Weight: 156 lb 3.2 oz (70.9 kg)   .Body mass index is 28.57 kg/m.  GENERAL:alert, in no acute distress and comfortable SKIN: no acute rashes, no significant lesions EYES: conjunctiva are pink and non-injected, sclera anicteric OROPHARYNX: MMM, no exudates, no oropharyngeal erythema or ulceration NECK: supple, no JVD LYMPH:  no palpable lymphadenopathy in the cervical or axillary regions. Pt has 1-1.5 inch lymph node in the inguinal region. LUNGS: clear to auscultation b/l with normal respiratory effort HEART: regular rate & rhythm ABDOMEN:  normoactive bowel sounds , non tender, not distended. Extremity: no pedal edema PSYCH: alert & oriented x 3 with fluent speech NEURO: no focal motor/sensory deficits  LABORATORY DATA:  I have reviewed the data as  listed  . CBC Latest Ref Rng & Units 02/28/2019 02/10/2019 01/04/2019  WBC 4.0 - 10.5 K/uL 6.3 7.0 8.0  Hemoglobin 12.0 - 15.0 g/dL 13.1 12.6 12.7  Hematocrit 36.0 - 46.0 % 39.7 38.9 38.5  Platelets 150 - 400 K/uL 401(H) 457(H) 350    . CMP Latest Ref Rng & Units 02/28/2019 12/08/2018 11/27/2018  Glucose 70 - 99 mg/dL 95 89 94  BUN 6 - 20 mg/dL 12 9 8   Creatinine 0.44 - 1.00 mg/dL 1.04(H) 0.81 0.78  Sodium 135 - 145 mmol/L 138 138 139  Potassium 3.5 - 5.1 mmol/L 4.0 4.4 4.4  Chloride 98 - 111 mmol/L 107 104 110  CO2 22 - 32 mmol/L 25 18(L) 22  Calcium 8.9 - 10.3 mg/dL 9.8 9.3 8.2(L)  Total Protein 6.5 - 8.1 g/dL 8.0 - -  Total Bilirubin 0.3 - 1.2 mg/dL 0.2(L) - -  Alkaline Phos 38 - 126 U/L 51 - -  AST 15 - 41 U/L 21 - -  ALT 0 - 44 U/L 24 - -   09/29/2018 Lymph node biopsy    RADIOGRAPHIC STUDIES: I have personally reviewed the radiological images as listed and agreed with the findings in the report. Mr Pelvis W Wo Contrast  Result Date: 02/22/2019 CLINICAL DATA:  Pelvic lymphadenopathy. EXAM: MRI PELVIS WITHOUT AND WITH CONTRAST TECHNIQUE: Multiplanar multisequence MR imaging of the pelvis was performed both before and after administration of intravenous contrast. CONTRAST:  56mL GADAVIST GADOBUTROL 1 MMOL/ML IV SOLN COMPARISON:  None. FINDINGS: Urinary Tract:  Urinary bladder is decompressed limiting assessment. Bowel:  Visualized bowel is unremarkable, not well assessed on MR. Vascular/Lymphatic: Vascular structures are patent. Signs of adenopathy are similar to prior CT scan. A right inguinal lymph  node measures 1.8 x 2.4 cm as compared to approximately 1 point 9 x 2.3 cm. Right external iliac lymph nodes and common iliac lymph nodes are unchanged ranging from just under a cm to just above the cm in short axis as compared to the CT exam of 09/14/2018. Reproductive: Post C-section appearance of the uterus is otherwise unremarkable with normal appearance of bilateral ovaries. Other:  Incidental imaging of the kidneys is noted on the initial coronal data set, no signs of hydronephrosis. Musculoskeletal: No acute bone finding or destructive bone process. IMPRESSION: Persistent right inguinal and iliac adenopathy, previously biopsied and within 1-2 mm of size when compared to 09/14/2018. Significance uncertain given unilateral enlargement, unusual for pelvic nodes with reactive changes. Imaging appearance remains nonspecific but again raises the question of lymphoproliferative disorder. Correlate with previous biopsy results with imaging and clinical follow-up as warranted. Mass on ultrasound likely represented pelvic sidewall/external iliac nodal enlargement on the right. Electronically Signed   By: Zetta Bills [name]   On: 02/22/2019 10:59    ASSESSMENT & PLAN:   38 yo with   1) Rt inguiinal and iliac lymphadenopathy of unclear etiology PLAN: -Discussed patient's most recent labs from 02/10/2019, all values are WNL except for RDW at 18.3, PLTs at 457K. -Discussed 12/08/2018 BMP is as follows: all values are WNL except for CO2 at 18. -Discussed Pt has had Lymph node biopsy (right groin) [id number]) completed on 09/29/2018 with results revealing "LYMPHOID HYPERPLASIA." -Discussed 12/16/2018 US Pelvic ([id number]) which revealed "1. RIGHT adnexal mass, discrete from the RIGHT ovary is 1.9 centimeters and avascular on Doppler evaluation. This likely represents enlarged RIGHT external iliac lymph node as seen on previous CT exams of the abdomen and pelvis 09/14/2018 and 08/31/2018. 2. Given the persistence of RIGHT iliac lymph node enlargement, consider biopsy of RIGHT inguinal lymph node, seen on prior CT exam. 3. Normal endometrial thickness. If bleeding remains unresponsive to hormonal or medical therapy, sonohysterogram should be considered for focal lesion work-up. (Ref: Radiological Reasoning: Algorithmic  Workup of Abnormal Vaginal Bleeding with Endovaginal Sonography and  Sonohysterography. AJR [id number])." -Discussed 02/22/2019 Pelvis MRI ([id number]) which revealed "Persistent right inguinal and iliac adenopathy, previously biopsied and within 1-2 mm of size when compared to 09/14/2018. Significance uncertain given unilateral enlargement, unusual for pelvic nodes with reactive changes. Imaging appearance remains nonspecific but again raises the question of lymphoproliferative disorder. Correlate with previous biopsy results with imaging and clinical follow-up as warranted. Mass on ultrasound likely represented pelvic sidewall/external iliac nodal enlargement on the right."  -Does not appear that the inguinal lymphadenopathy is the cause of pt's pelvic pain -Will refer to [facility] for inguinal lymph node biopsy - to elucidate definitive etiology for her lymphadenopathy -continue f/u with Gyn -Will get labs today  -Will see back in 4 weeks  2) Iron deficiency anemia Ferritin improved from 4 to 28. hgb wnl at 13.1 Plan -continue po iron per PCP  FOLLOW UP: Labs today Referral to Dr Dalbert [facility] for rt inguinal LN biopsy RTC with Dr Irene Limbo in 4 weeks  All of the patients questions were answered with apparent satisfaction. The patient knows to call the clinic with any problems, questions or concerns.  I spent 30 mins counseling the patient face to face. The total time spent in the appointment was 45 mins and more than 50% was on counseling and direct patient cares.    Sullivan Lone MD MS AAHIVMS [facility] Hematology/Oncology Physician [facility]  (  Office):       [phone] (Work cell):  [phone] (Fax):           [phone]  02/26/2019 2:04 PM  I, Yevette Edwards, am acting as a scribe for Dr. Sullivan Lone.   .I have reviewed the above documentation for accuracy and completeness, and I agree with the above. Brunetta Genera MD

## 2019-02-28 ENCOUNTER — Telehealth: Payer: Self-pay | Admitting: Hematology

## 2019-02-28 ENCOUNTER — Other Ambulatory Visit: Payer: Self-pay

## 2019-02-28 ENCOUNTER — Inpatient Hospital Stay: Payer: Medicaid Other

## 2019-02-28 ENCOUNTER — Inpatient Hospital Stay: Payer: Medicaid Other | Attending: Gynecologic Oncology | Admitting: Hematology

## 2019-02-28 ENCOUNTER — Other Ambulatory Visit: Payer: Self-pay | Admitting: Oncology

## 2019-02-28 VITALS — BP 118/83 | HR 79 | Temp 98.5°F | Resp 18 | Ht 62.0 in | Wt 156.2 lb

## 2019-02-28 DIAGNOSIS — R590 Localized enlarged lymph nodes: Secondary | ICD-10-CM | POA: Diagnosis not present

## 2019-02-28 DIAGNOSIS — D509 Iron deficiency anemia, unspecified: Secondary | ICD-10-CM | POA: Diagnosis not present

## 2019-02-28 DIAGNOSIS — N920 Excessive and frequent menstruation with regular cycle: Secondary | ICD-10-CM | POA: Insufficient documentation

## 2019-02-28 LAB — CBC WITH DIFFERENTIAL/PLATELET
Abs Immature Granulocytes: 0.02 10*3/uL (ref 0.00–0.07)
Basophils Absolute: 0 10*3/uL (ref 0.0–0.1)
Basophils Relative: 1 %
Eosinophils Absolute: 0.2 10*3/uL (ref 0.0–0.5)
Eosinophils Relative: 3 %
HCT: 39.7 % (ref 36.0–46.0)
Hemoglobin: 13.1 g/dL (ref 12.0–15.0)
Immature Granulocytes: 0 %
Lymphocytes Relative: 41 %
Lymphs Abs: 2.6 10*3/uL (ref 0.7–4.0)
MCH: 28.1 pg (ref 26.0–34.0)
MCHC: 33 g/dL (ref 30.0–36.0)
MCV: 85.2 fL (ref 80.0–100.0)
Monocytes Absolute: 0.5 10*3/uL (ref 0.1–1.0)
Monocytes Relative: 8 %
Neutro Abs: 3 10*3/uL (ref 1.7–7.7)
Neutrophils Relative %: 47 %
Platelets: 401 10*3/uL — ABNORMAL HIGH (ref 150–400)
RBC: 4.66 MIL/uL (ref 3.87–5.11)
RDW: 15.6 % — ABNORMAL HIGH (ref 11.5–15.5)
WBC: 6.3 10*3/uL (ref 4.0–10.5)
nRBC: 0 % (ref 0.0–0.2)

## 2019-02-28 LAB — IRON AND TIBC
Iron: 105 ug/dL (ref 41–142)
Saturation Ratios: 27 % (ref 21–57)
TIBC: 394 ug/dL (ref 236–444)
UIBC: 289 ug/dL (ref 120–384)

## 2019-02-28 LAB — CMP (CANCER CENTER ONLY)
ALT: 24 U/L (ref 0–44)
AST: 21 U/L (ref 15–41)
Albumin: 4.6 g/dL (ref 3.5–5.0)
Alkaline Phosphatase: 51 U/L (ref 38–126)
Anion gap: 6 (ref 5–15)
BUN: 12 mg/dL (ref 6–20)
CO2: 25 mmol/L (ref 22–32)
Calcium: 9.8 mg/dL (ref 8.9–10.3)
Chloride: 107 mmol/L (ref 98–111)
Creatinine: 1.04 mg/dL — ABNORMAL HIGH (ref 0.44–1.00)
GFR, Est AFR Am: >60 mL/min (ref >60)
GFR, Estimated: >60 mL/min (ref >60)
Glucose, Bld: 95 mg/dL (ref 70–99)
Potassium: 4 mmol/L (ref 3.5–5.1)
Sodium: 138 mmol/L (ref 135–145)
Total Bilirubin: 0.2 mg/dL — ABNORMAL LOW (ref 0.3–1.2)
Total Protein: 8 g/dL (ref 6.5–8.1)

## 2019-02-28 LAB — FERRITIN: Ferritin: 28 ng/mL (ref 11–307)

## 2019-02-28 LAB — LACTATE DEHYDROGENASE: LDH: 181 U/L (ref 98–192)

## 2019-02-28 LAB — SEDIMENTATION RATE: Sed Rate: 5 mm/hr (ref 0–22)

## 2019-02-28 NOTE — Progress Notes (Signed)
Gynecologic Oncology Multi-Disciplinary Disposition Conference Note  Date of the Conference: 02/28/2019  Patient Name: Leah Baker  Referring Provider: Dr. Annye English Primary GYN Oncologist: Dr. Everitt Amber  Stage/Disposition:  Disposition is to consult with Dr. Irene Limbo for lymphadenopathy on 02/28/2019.   This Multidisciplinary conference took place involving physicians from [place], [place], Radiation Oncology, Pathology, Radiology along with the Gynecologic Oncology Nurse Practitioner and RN.  Comprehensive assessment of the patient's malignancy, staging, need for surgery, chemotherapy, radiation therapy, and need for further testing were reviewed. Supportive measures, both inpatient and following discharge were also discussed. The recommended plan of care is documented. Greater than 35 minutes were spent correlating and coordinating this patient's care.

## 2019-02-28 NOTE — Telephone Encounter (Signed)
Scheduled per 09/28 los, checked in patient for labs as well.

## 2019-03-03 ENCOUNTER — Encounter: Payer: Self-pay | Admitting: *Deleted

## 2019-03-04 ENCOUNTER — Other Ambulatory Visit: Payer: Self-pay

## 2019-03-04 ENCOUNTER — Ambulatory Visit
Admission: RE | Admit: 2019-03-04 | Discharge: 2019-03-04 | Disposition: A | Discharge status: Home / Self Care | Payer: Medicaid Other | Source: Ambulatory Visit | Attending: Obstetrics & Gynecology | Admitting: Obstetrics & Gynecology

## 2019-03-04 DIAGNOSIS — Z1231 Encounter for screening mammogram for malignant neoplasm of breast: Secondary | ICD-10-CM | POA: Diagnosis not present

## 2019-03-04 DIAGNOSIS — Z01419 Encounter for gynecological examination (general) (routine) without abnormal findings: Secondary | ICD-10-CM

## 2019-03-08 DIAGNOSIS — K625 Hemorrhage of anus and rectum: Secondary | ICD-10-CM | POA: Diagnosis not present

## 2019-03-08 DIAGNOSIS — R590 Localized enlarged lymph nodes: Secondary | ICD-10-CM | POA: Diagnosis not present

## 2019-03-08 DIAGNOSIS — K769 Liver disease, unspecified: Secondary | ICD-10-CM | POA: Diagnosis not present

## 2019-03-10 ENCOUNTER — Other Ambulatory Visit: Payer: Self-pay | Admitting: Gastroenterology

## 2019-03-10 DIAGNOSIS — K769 Liver disease, unspecified: Secondary | ICD-10-CM

## 2019-03-29 ENCOUNTER — Ambulatory Visit: Payer: Medicaid Other | Admitting: Hematology

## 2019-03-30 ENCOUNTER — Ambulatory Visit: Payer: Medicaid Other | Attending: Critical Care Medicine | Admitting: Critical Care Medicine

## 2019-03-30 ENCOUNTER — Encounter: Payer: Self-pay | Admitting: Critical Care Medicine

## 2019-03-30 ENCOUNTER — Other Ambulatory Visit: Payer: Self-pay

## 2019-03-30 VITALS — BP 107/69 | HR 91 | Temp 99.9°F | Ht 62.0 in | Wt 162.0 lb

## 2019-03-30 DIAGNOSIS — N920 Excessive and frequent menstruation with regular cycle: Secondary | ICD-10-CM

## 2019-03-30 DIAGNOSIS — R590 Localized enlarged lymph nodes: Secondary | ICD-10-CM | POA: Diagnosis not present

## 2019-03-30 DIAGNOSIS — R1031 Right lower quadrant pain: Secondary | ICD-10-CM | POA: Diagnosis not present

## 2019-03-30 DIAGNOSIS — D649 Anemia, unspecified: Secondary | ICD-10-CM

## 2019-03-30 DIAGNOSIS — F39 Unspecified mood [affective] disorder: Secondary | ICD-10-CM | POA: Diagnosis not present

## 2019-03-30 DIAGNOSIS — D500 Iron deficiency anemia secondary to blood loss (chronic): Secondary | ICD-10-CM | POA: Diagnosis not present

## 2019-03-30 DIAGNOSIS — K649 Unspecified hemorrhoids: Secondary | ICD-10-CM | POA: Diagnosis not present

## 2019-03-30 DIAGNOSIS — F329 Major depressive disorder, single episode, unspecified: Secondary | ICD-10-CM

## 2019-03-30 DIAGNOSIS — F430 Acute stress reaction: Secondary | ICD-10-CM

## 2019-03-30 DIAGNOSIS — K625 Hemorrhage of anus and rectum: Secondary | ICD-10-CM | POA: Diagnosis not present

## 2019-03-30 DIAGNOSIS — F411 Generalized anxiety disorder: Secondary | ICD-10-CM

## 2019-03-30 MED ORDER — TRAMADOL HCL 50 MG PO TABS: 50.0000 mg | ORAL_TABLET | Freq: Three times a day (TID) | ORAL | 0 refills | Status: AC | PRN

## 2019-03-30 MED ORDER — SERTRALINE HCL 100 MG PO TABS: 100.0000 mg | ORAL_TABLET | Freq: Every day | ORAL | 2 refills | Status: DC

## 2019-03-30 MED ORDER — FERROUS SULFATE 325 (65 FE) MG PO TBEC: 325.0000 mg | DELAYED_RELEASE_TABLET | Freq: Two times a day (BID) | ORAL | 3 refills | Status: DC

## 2019-03-30 NOTE — Assessment & Plan Note (Signed)
Internal hemorrhoids with rectal bleeding that does not appear to be an active issue at this time and note hemoglobin is stable at 13 end of September will be rechecked again today  The inguinal lymphadenopathy does not appear to be related to the internal hemorrhoids

## 2019-03-30 NOTE — Progress Notes (Signed)
Subjective:    Patient ID: Leah Baker, female    [name]: Oct 31, 1980, 38 y.o.   MRN: [id number]  This is a 38 year old female seen back in follow-up for lower GI bleeding, blood loss anemia, internal hemorrhoids, and iliac and right inguinal lymphadenopathy, and now a new problem regarding menorrhagia.  Since last visit the patient required hospitalization with a hemoglobin of 5 and was determined to have excess uterine bleeding in addition to the ongoing hemorrhoidal bleeding from internal hemorrhoids.  The patient continues to have right lower quadrant abdominal pain and right inguinal lymphadenopathy and right iliac lymphadenopathy.  Note previous abdominal CT scans have not revealed pelvic pathology.  The patient has since seen gynecology Dr. Roselie Baker upon request of the general surgeon.  It appears that the vaginal and abdominal ultrasound will be obtained and is scheduled next week.  Previous ultrasounds of the abdomen and pelvis in 2014 was normal.  There is been a prior history of heavy vaginal bleeding and this is a compounded the iron deficiency anemia.  1 recommendation of the discharge summary was for the patient received intravenous iron.  In the hospitalization she received 2 units of packed cells and 1 dose of 510 mg Feraheme.  Currently the patient shortness of breath is better but her abdominal pain continues to persist.  She has had no additional chest pains.  Note she cannot take nonsteroidal anti-inflammatories due to the bleeding.  Note also the patient is now on Megace in addition to iron supplementation and this has helped to slow down the menstrual bleeding.  The patient also had hypokalemia which needs to be followed up as well  01/18/2019 Since the last office visit the patient had to go to urgent care on August 4 and then had a follow-up with gynecology on August 5 for menorrhagia and continued abdominal pain.  The patient's been maintained on Megace 40 mg twice daily.   General surgery states the internal hemorrhoids do not require further treatment and is not the cause of the abdominal pain.  Gynecology feels that a ablation procedure is indicated for the excess menstrual bleeding.  Patient will first need an endometrial biopsy and this is pending.  The patient is yet to receive her Feraheme injections.  The patient had a hemoglobin checked on August 4 it was stable at 12.7. Since last OV went to [facility]  8/4 and saw Gyn 8/5 for menorrhagia and continued abdominal pain.    03/30/2019 This patient was last seen in August and since that time the patient has had an eventful 2 months.  In September the patient was referred to gynecologic oncology for an endometrial biopsy which was negative for cancer.  General gynecology has determined hormonal therapy is the best for her excess bleeding during periods.  The patient has persistent right inguinal lymphadenopathy and iliac lymphadenopathy.  An MRI had been performed and showed not much change from in April CT scan.  The patient comes in today very frustrated and that she feels that she has been to multiple specialists but does not have a clear answer as to what her condition is.  Patient also appears to be under a great deal of stress and that she has multiple family members including her father who is currently in the intensive care unit at [facility] with severe heart failure.  She had seen oncology and recommendation there was to perform a complete lymph node excision of the right inguinal area and she is been referred  back to [facility] surgery for this.  She tried to go for a visit last week but was denied entrance when her temperature was at 99 degrees.  She does not clearly have any evidence of Covid at this time.  She has a new appointment with surgery upcoming and I am going to be messaging the surgeon about the need for the lymph node biopsy.  She has had multiple blood work done by oncology to determine if there is  an oncologic issue and it is been negative so far.  The hemorrhoidal rectal bleeding appears to be stable and is not a major issue.  The gynecologic oncologist indicated that she did not need Feraheme infusions and upon review of her CBCs through July up into this date all of her hemoglobins have stabilized to the 13 range.  She is on oral iron supplementation 4 times daily.  She has significant GI upset when she takes the oral iron.  The patient is still experiencing abdominal pain in the right lower quadrant she states when she was on tramadol this provided the best relief.  She was offered diclofenac but this is causing excess bleeding therefore this was not taken.  She states her pain is on a scale of 0-10  Is 7                Past Medical History:  Diagnosis Date   Acute costochondritis 11/08/2018   anterior chest wall pain  per pt pcp note in epic on 11-10-2018   Costochondritis, acute 11/10/2018   Depression    Hemorrhoids    History of lower GI bleeding 08/31/2018   s/p  colonoscopy 09-01-2018,  hemorrhoids   IDA (iron deficiency anemia)    Inguinal lymphadenopathy    CT 09-14-2018  right side, followed by pcp   PONV (postoperative nausea and vomiting)    only happened once   Wears glasses      Family History  Problem Relation Age of Onset   Cancer Mother    Kidney failure Mother    Healthy Father      Social History   Socioeconomic History   Marital status: Single    Spouse name: Not on file   Number of children: Not on file   Years of education: Not on file   Highest education level: Not on file  Occupational History   Not on file  Social Needs   Financial resource strain: Not on file   Food insecurity    Worry: Not on file    Inability: Not on file   Transportation needs    Medical: Not on file    Non-medical: Not on file  Tobacco Use   Smoking status: Former Smoker    Years: 5.00    Types: Cigarettes    Quit date:  08/22/2008    Years since quitting: 10.6   Smokeless tobacco: Never Used  Substance and Sexual Activity   Alcohol use: Yes    Comment: occasionally   Drug use: Not Currently    Comment: per pt younger in college marijuana, none since   Sexual activity: Yes    Birth control/protection: Surgical  Lifestyle   Physical activity    Days per week: Not on file    Minutes per session: Not on file   Stress: Not on file  Relationships   Social connections    Talks on phone: Not on file    Gets together: Not on file    Attends religious  service: Not on file    Active member of club or organization: Not on file    Attends meetings of clubs or organizations: Not on file    Relationship status: Not on file   Intimate partner violence    Fear of current or ex partner: Not on file    Emotionally abused: Not on file    Physically abused: Not on file    Forced sexual activity: Not on file  Other Topics Concern   Not on file  Social History Narrative   Not on file     No Known Allergies   Outpatient Medications Prior to Visit  Medication Sig Dispense Refill   acetaminophen (TYLENOL) 325 MG tablet Take 2 tablets (650 mg total) by mouth every 6 (six) hours as needed for mild pain (or Fever >/= 101).     megestrol (MEGACE) 40 MG tablet Take 2 tablets (80 mg total) by mouth 2 (two) times daily. 90 tablet 3   Multiple Vitamins-Minerals (WOMENS MULTI VITAMIN & MINERAL PO) Take 1 tablet by mouth daily.     psyllium (HYDROCIL/METAMUCIL) 95 % PACK Take 1 packet by mouth daily.     ferrous sulfate 325 (65 FE) MG EC tablet Take 1 tablet (325 mg total) by mouth 2 (two) times a day. (Patient taking differently: Take 325 mg by mouth 4 (four) times daily. ) 90 tablet 3   sertraline (ZOLOFT) 50 MG tablet Take 1 tablet (50 mg total) by mouth at bedtime. 30 tablet 3   ALPRAZolam (NIRAVAM) 0.5 MG dissolvable tablet Take 1/2 tablet every 8 hours as needed for anxiety and may take 1 pill at  bedtime as needed (Patient not taking: Reported on 03/30/2019) 20 tablet 0   amoxicillin-clavulanate (AUGMENTIN) 500-125 MG tablet Take 1 tablet (500 mg total) by mouth 2 (two) times daily. Take after eating (Patient not taking: Reported on 02/24/2019) 20 tablet 0   diclofenac (VOLTAREN) 75 MG EC tablet Take 1 tablet (75 mg total) by mouth 2 (two) times daily with a meal. (Patient not taking: Reported on 02/10/2019) 60 tablet 2   ferumoxytol (FERAHEME) 510 MG/17ML SOLN injection Inject 17 mLs (510 mg total) into the vein every 14 (fourteen) days for 3 doses. (Patient not taking: Reported on 02/10/2019) 51 mL 0   HYDROcodone-acetaminophen (VICODIN) 5-325 mg TABS tablet 1/2 or one every 6-8 hours as needed for pain; take after eating (Patient not taking: Reported on 02/24/2019) 15 tablet 0   No facility-administered medications prior to visit.      Constitutional:   No  weight loss, night sweats,  Fevers, chills, fatigue, lassitude. HEENT:   No headaches,  Difficulty swallowing,  Tooth/dental problems,  Sore throat,                No sneezing, itching, ear ache, nasal congestion, post nasal drip,   CV:   chest pain,  Orthopnea, PND, swelling in lower extremities, anasarca, dizziness, palpitations  GI   heartburn, indigestion, abdominal pain, nausea, vomiting, diarrhea, change in bowel habits, loss of appetite,  BLood in stool  Resp: No shortness of breath with exertion or at rest.  No excess mucus, no productive cough,  No non-productive cough,  No coughing up of blood.  No change in color of mucus.  No wheezing.  No chest wall deformity  Skin: no rash or lesions. GYN: heavy prolonged menses  GU: no dysuria, change in color of urine, no urgency or frequency.  No flank pain.  MS:  No joint pain or swelling.  No decreased range of motion.  No back pain.  Psych:  No change in mood or affect. No depression or anxiety.  No memory loss.     Objective:   Physical Exam Vitals:   03/30/19 0841    BP: 107/69  Pulse: 91  Temp: 99.9 F (37.7 C)  TempSrc: Oral  SpO2: 100%  Weight: 162 lb (73.5 kg)  Height: 5\' 2"  (1.575 m)    [id number] , frustrated tearful , well-nourished, in no distress,   ENT: No lesions,  mouth clear,  oropharynx clear, no postnasal drip  Neck: No JVD, no TMG, no carotid bruits  Lungs: No use of accessory muscles, no dullness to percussion, clear without rales or rhonchi  Cardiovascular: RRR, heart sounds normal, no murmur or gallops, no peripheral edema   Abdomen: soft tender RLQ  R inguinal LAN  Musculoskeletal: No deformities, no cyanosis or clubbing  Neuro: alert, non focal  Skin: Warm, no lesions or rashes CBC Latest Ref Rng & Units 02/28/2019 02/10/2019 01/04/2019  WBC 4.0 - 10.5 K/uL 6.3 7.0 8.0  Hemoglobin 12.0 - 15.0 g/dL 13.1 12.6 12.7  Hematocrit 36.0 - 46.0 % 39.7 38.9 38.5  Platelets 150 - 400 K/uL 401(H) 457(H) 350   BMP Latest Ref Rng & Units 02/28/2019 12/08/2018 11/27/2018  Glucose 70 - 99 mg/dL 95 89 94  BUN 6 - 20 mg/dL 12 9 8   Creatinine 0.44 - 1.00 mg/dL 1.04(H) 0.81 0.78  BUN/Creat Ratio 9 - 23 - 11 -  Sodium 135 - 145 mmol/L 138 138 139  Potassium 3.5 - 5.1 mmol/L 4.0 4.4 4.4  Chloride 98 - 111 mmol/L 107 104 110  CO2 22 - 32 mmol/L 25 18(L) 22  Calcium 8.9 - 10.3 mg/dL 9.8 9.3 8.2(L)   12/16/18 Vag U/S IMPRESSION: 1. RIGHT adnexal mass, discrete from the RIGHT ovary is 1.9 centimeters and avascular on Doppler evaluation. This likely represents enlarged RIGHT external iliac lymph node as seen on previous CT exams of the abdomen and pelvis 09/14/2018 and 08/31/2018. 2. Given the persistence of RIGHT iliac lymph node enlargement, consider biopsy of RIGHT inguinal lymph node, seen on prior CT exam. 3. Normal endometrial thickness. If bleeding remains unresponsive to hormonal or medical therapy, sonohysterogram should be considered for focal lesion work-up. (Ref: Radiological Reasoning: Algorithmic  Iron level 14- 17  (low  normal 28) from 6/27  MRI pelvis:9/22 IMPRESSION: Persistent right inguinal and iliac adenopathy, previously biopsied and within 1-2 mm of size when compared to 09/14/2018. Significance uncertain given unilateral enlargement, unusual for pelvic nodes with reactive changes. Imaging appearance remains nonspecific but again raises the question of lymphoproliferative disorder. Correlate with previous biopsy results with imaging and clinical follow-up as warranted.  Mass on ultrasound likely represented pelvic sidewall/external iliac nodal enlargement on the right.  Mammogram 03/2019 : neg      Assessment & Plan:  I personally reviewed all images and lab data in the [facility] system as well as any outside material available during this office visit and agree with the  radiology impressions.   Hemorrhoid Internal hemorrhoids with rectal bleeding that does not appear to be an active issue at this time and note hemoglobin is stable at 13 end of September will be rechecked again today  The inguinal lymphadenopathy does not appear to be related to the internal hemorrhoids  Rectal bleeding Rectal bleeding appears to be minimal at this time  Inguinal lymphadenopathy The patient has been assessed by  oncology who is now requesting a lymph node excision of the right inguinal lymph node for full pathologic examination despite the fact that her core biopsy from this previously was negative for malignancy  I have messaged Dr. Dema Severin who is seen her previously for internal hemorrhoids to see if he could perform this procedure and a referral from oncology also has occurred  Iron deficiency anemia I agree with the gynecologic oncologist and that the patient does not need iron infusions at this time and I have discontinued the Feraheme infusion orders.  I have asked the patient to reduce her iron supplementation to twice daily to reduce GI upset as her hemoglobin is stable at this time at 13 and will be  rechecked again today  Heavy menstrual period Heavy menstrual periods are still continuing along with pelvic pain and for this we will prescribe tramadol as needed and continue the Megace 80 mg twice daily  Right lower quadrant abdominal pain Right lower quadrant abdominal pain appears to be persisting related to the inguinal lymphadenopathy  Depression Reactive depression along with mood disorder for this will increase sertraline to 100 mg daily   Catricia was seen today for follow-up.  Diagnoses and all orders for this visit:  Iron deficiency anemia due to chronic blood loss -     CBC with Differential/Platelet; Future -     CBC with Differential/Platelet  Anemia -     ferrous sulfate 325 (65 FE) MG EC tablet; Take 1 tablet (325 mg total) by mouth 2 (two) times daily with a meal.  Anxiety as acute reaction to exceptional stress -     sertraline (ZOLOFT) 100 MG tablet; Take 1 tablet (100 mg total) by mouth at bedtime.  Inguinal lymphadenopathy  Hemorrhoids, unspecified hemorrhoid type  Mood disorder (HCC)  Reactive depression  Rectal bleeding  Menorrhagia with regular cycle  Right lower quadrant abdominal pain  Other orders -     traMADol (ULTRAM) 50 MG tablet; Take 1 tablet (50 mg total) by mouth every 8 (eight) hours as needed for up to 20 days.

## 2019-03-30 NOTE — Assessment & Plan Note (Signed)
The patient has been assessed by oncology who is now requesting a lymph node excision of the right inguinal lymph node for full pathologic examination despite the fact that her core biopsy from this previously was negative for malignancy  I have messaged Dr. Dema Severin who is seen her previously for internal hemorrhoids to see if he could perform this procedure and a referral from oncology also has occurred

## 2019-03-30 NOTE — Assessment & Plan Note (Signed)
Reactive depression along with mood disorder for this will increase sertraline to 100 mg daily

## 2019-03-30 NOTE — Assessment & Plan Note (Signed)
Heavy menstrual periods are still continuing along with pelvic pain and for this we will prescribe tramadol as needed and continue the Megace 80 mg twice daily

## 2019-03-30 NOTE — Assessment & Plan Note (Signed)
Rectal bleeding appears to be minimal at this time

## 2019-03-30 NOTE — Patient Instructions (Signed)
Reduce iron to 1 tablet twice daily  Tramadol was sent to your pharmacy as needed for pain  Keep your follow-up appointment with general surgery for lymph node biopsy  No iron infusions are needed at this time  CBC will be checked today  Increase sertraline 100 mg daily  Return to Dr. [name] 1 month

## 2019-03-30 NOTE — Progress Notes (Signed)
Pt. is here for a 2 months follow up.

## 2019-03-30 NOTE — Assessment & Plan Note (Signed)
Right lower quadrant abdominal pain appears to be persisting related to the inguinal lymphadenopathy

## 2019-03-30 NOTE — Assessment & Plan Note (Signed)
I agree with the gynecologic oncologist and that the patient does not need iron infusions at this time and I have discontinued the Feraheme infusion orders.  I have asked the patient to reduce her iron supplementation to twice daily to reduce GI upset as her hemoglobin is stable at this time at 13 and will be rechecked again today

## 2019-03-31 ENCOUNTER — Telehealth: Payer: Self-pay | Admitting: *Deleted

## 2019-03-31 ENCOUNTER — Telehealth: Payer: Self-pay | Admitting: Hematology

## 2019-03-31 ENCOUNTER — Inpatient Hospital Stay: Payer: Medicaid Other | Admitting: Hematology

## 2019-03-31 LAB — CBC WITH DIFFERENTIAL/PLATELET
Basophils Absolute: 0 10*3/uL (ref 0.0–0.2)
Basos: 1 %
EOS (ABSOLUTE): 0.2 10*3/uL (ref 0.0–0.4)
Eos: 2 %
Hematocrit: 34.1 % (ref 34.0–46.6)
Hemoglobin: 11.1 g/dL (ref 11.1–15.9)
Immature Grans (Abs): 0 10*3/uL (ref 0.0–0.1)
Immature Granulocytes: 0 %
Lymphocytes Absolute: 2.5 10*3/uL (ref 0.7–3.1)
Lymphs: 33 %
MCH: 28 pg (ref 26.6–33.0)
MCHC: 32.6 g/dL (ref 31.5–35.7)
MCV: 86 fL (ref 79–97)
Monocytes Absolute: 0.6 10*3/uL (ref 0.1–0.9)
Monocytes: 7 %
Neutrophils Absolute: 4.2 10*3/uL (ref 1.4–7.0)
Neutrophils: 57 %
Platelets: 413 10*3/uL (ref 150–450)
RBC: 3.97 x10E6/uL (ref 3.77–5.28)
RDW: 12.8 % (ref 11.7–15.4)
WBC: 7.5 10*3/uL (ref 3.4–10.8)

## 2019-03-31 NOTE — Telephone Encounter (Signed)
Returned patient's phone call regarding rescheduling an appointment, per patient's request 11/17 has moved to 11/19. Patient can only do morning appointments.

## 2019-03-31 NOTE — Telephone Encounter (Signed)
Patient called - has had temp over 100 F for 2 weeks off and on. States was not able to see surgeon last week due to temp. She has seen PCP and had several tests done including Covid. She asked to have appt r/s after biopsy on 11/9. MD appt for today cancelled. Schedule message sent. Requested patient contact office if Covid test is positive as MD appt at [facility] will need to rescheduled further out. She verbalized understanding and is in agreement.

## 2019-03-31 NOTE — Telephone Encounter (Signed)
Scheduled appt per 10/29 sch message - unable to reach pt. Left message with appt date and time   

## 2019-04-01 ENCOUNTER — Ambulatory Visit
Admission: RE | Admit: 2019-04-01 | Discharge: 2019-04-01 | Disposition: A | Discharge status: Home / Self Care | Payer: Medicaid Other | Source: Ambulatory Visit | Attending: Gastroenterology | Admitting: Gastroenterology

## 2019-04-01 DIAGNOSIS — K769 Liver disease, unspecified: Secondary | ICD-10-CM | POA: Diagnosis not present

## 2019-04-06 ENCOUNTER — Other Ambulatory Visit: Payer: Self-pay | Admitting: Gastroenterology

## 2019-04-06 DIAGNOSIS — R935 Abnormal findings on diagnostic imaging of other abdominal regions, including retroperitoneum: Secondary | ICD-10-CM

## 2019-04-11 DIAGNOSIS — R2241 Localized swelling, mass and lump, right lower limb: Secondary | ICD-10-CM | POA: Diagnosis not present

## 2019-04-19 ENCOUNTER — Ambulatory Visit: Payer: Medicaid Other | Admitting: Hematology

## 2019-04-20 ENCOUNTER — Telehealth: Payer: Self-pay | Admitting: Hematology

## 2019-04-20 NOTE — Telephone Encounter (Signed)
Scheduled appt per 11/18 sch message - unable to reach pt . Left message with appt date and time

## 2019-04-21 ENCOUNTER — Inpatient Hospital Stay: Payer: Medicaid Other | Admitting: Hematology

## 2019-04-27 ENCOUNTER — Ambulatory Visit: Payer: Medicaid Other | Admitting: Critical Care Medicine

## 2019-05-04 ENCOUNTER — Encounter: Payer: Self-pay | Admitting: Family Medicine

## 2019-05-04 ENCOUNTER — Ambulatory Visit: Payer: Medicaid Other | Attending: Family Medicine | Admitting: Family Medicine

## 2019-05-04 ENCOUNTER — Other Ambulatory Visit: Payer: Self-pay

## 2019-05-04 DIAGNOSIS — R109 Unspecified abdominal pain: Secondary | ICD-10-CM | POA: Diagnosis not present

## 2019-05-04 DIAGNOSIS — N921 Excessive and frequent menstruation with irregular cycle: Secondary | ICD-10-CM

## 2019-05-04 MED ORDER — METHOCARBAMOL 500 MG PO TABS: 500.0000 mg | ORAL_TABLET | Freq: Three times a day (TID) | ORAL | 1 refills | Status: DC | PRN

## 2019-05-04 MED ORDER — TRAMADOL HCL 50 MG PO TABS: 50.0000 mg | ORAL_TABLET | Freq: Four times a day (QID) | ORAL | 0 refills | Status: AC | PRN

## 2019-05-04 NOTE — Progress Notes (Signed)
Patient verified [name] Patient has taken medication today. Patient has not eaten today. Patient complains of cramping. Patient began a hormone to stop bleeding since MAY/JUNE to treat an internal bleed. The dosing was increased 2 months ago and patient feels as though the medication is no longer assisting. Patient has appointment with GYN in January and not sooner.

## 2019-05-04 NOTE — Progress Notes (Addendum)
Virtual Visit via Telephone Note  I connected with Leah Baker on 05/04/19 at  9:50 AM EST by telephone and verified that I am speaking with the correct person using two identifiers.   I discussed the limitations, risks, security and privacy concerns of performing an evaluation and management service by telephone and the availability of in person appointments. I also discussed with the patient that there may be a patient responsible charge related to this service. The patient expressed understanding and agreed to proceed.  Patient Location: Home Provider Location: Office Others participating in call: none   History of Present Illness:        38 year old female with complaint of recent onset of moderate to severe generalized abdominal cramping after having onset of her menses for the first time since July of this year.  She reports that she has been having issues with spotting and recently called her GYN who increased her dose of Megace.  She reports that once her menses started and she started having increased abdominal pain/cramping she contacted her GYN who told her that she would have to contact her primary care regarding pain medication.  Patient states that she also saw a surgeon regarding her inguinal lymphadenopathy a few months ago and the surgeon's examination caused her to have increased pain in the area of the inguinal lymphadenopathy and she was prescribed tramadol by a provider here which did help.  She does have follow-up appointment in about 2 weeks with that provider.  She states that she is currently having very heavy bleeding on a daily basis she is continuing to take the medication prescribed by her GYN.  She is also taking iron due to history of anemia.  She denies any urinary frequency or urgency, no increased back pain, no fever or chills.  She denies any chest pain or palpitations, no shortness of breath or cough.  No increased peripheral edema.  She states that her major  issue is the abdominal cramping due to her current heavy menses.  She denies any passage of blood clots.   Past Medical History:  Diagnosis Date   Acute costochondritis 11/08/2018   anterior chest wall pain  per pt pcp note in epic on 11-10-2018   Costochondritis, acute 11/10/2018   Depression    Hemorrhoids    History of lower GI bleeding 08/31/2018   s/p  colonoscopy 09-01-2018,  hemorrhoids   IDA (iron deficiency anemia)    Inguinal lymphadenopathy    CT 09-14-2018  right side, followed by pcp   PONV (postoperative nausea and vomiting)    only happened once   Wears glasses     Past Surgical History:  Procedure Laterality Date   BIOPSY  09/01/2018   Procedure: BIOPSY;  Surgeon: Otis Brace, MD;  Location: [facility];  Service: Gastroenterology;;   CESAREAN SECTION  01/15/2002   @[facility]    CESAREAN SECTION  03/17/2011   Procedure: CESAREAN SECTION;  Surgeon: Osborne Oman, MD;  Location: [facility];  Service: Gynecology;  Laterality: N/A;   COLONOSCOPY WITH PROPOFOL N/A 09/01/2018   Procedure: COLONOSCOPY WITH PROPOFOL;  Surgeon: Otis Brace, MD;  Location: [place];  Service: Gastroenterology;  Laterality: N/A;   HYSTEROSCOPY W/D&C  06-21-2009  dr constant @[facility]    w/  REMOVAL IUD    IUD REMOVAL  06/21/2009   KNEE ARTHROSCOPY WITH ANTERIOR CRUCIATE LIGAMENT (ACL) REPAIR Left 09-08-2013   dr [name]  @[facility]    w/  meniscal repair   MULTIPLE TOOTH EXTRACTIONS  RECTAL EXAM UNDER ANESTHESIA N/A 11/18/2018   Procedure: ANORECTAL EXAM UNDER ANESTHESIA;  Surgeon: Ileana Roup, MD;  Location: [facility];  Service: General;  Laterality: N/A;   TUBAL LIGATION Bilateral 2012    Family History  Problem Relation Age of Onset   Cancer Mother    Kidney failure Mother    Healthy Father     Social History   Tobacco Use   Smoking status: Former Smoker    Years: 5.00    Types: Cigarettes    Quit date: 08/22/2008    Years since quitting:  10.7   Smokeless tobacco: Never Used  Substance Use Topics   Alcohol use: Yes    Comment: occasionally   Drug use: Not Currently    Comment: per pt younger in college marijuana, none since     No Known Allergies     Observations/Objective: No vital signs or physical exam conducted as visit was done via telephone  Assessment and Plan: 1. Abdominal cramping; 2.  Menorrhagia with irregular cycle Patient with complaint of recent onset of abdominal cramping when she had onset of her first full menses since July of this year.  She denies any other symptoms such as urinary frequency or dysuria suggestive of urinary tract infection.  She is currently undergoing evaluation regarding inguinal lymphadenopathy and states that she has an appointment next week regarding an MRI and follow-up of her adenopathy along with follow-up appointment in January with specialist.  She has a follow-up appointment in the next 2 weeks with a doctor here in this office.  She does not feel as if she is having current anemia as she continues to take iron pills.  She did have hemoglobin done on 03/30/2019 which was normal.  Prescriptions will be sent to her pharmacy for Robaxin 500 mg 3 times daily as needed for muscle spasm and patient was aware that the medication may cause her to feel drowsy.  Prescription also sent to pharmacy for tramadol to take every 6-8 hours as needed for pain.  If she has worsening of abdominal pain or any other concerns she should go to the urgent care or emergency department for further evaluation and/or contact her OB/GYN.  She is encouraged to keep her 2-week follow-up appointment at this office. - methocarbamol (ROBAXIN) 500 MG tablet; Take 1 tablet (500 mg total) by mouth every 8 (eight) hours as needed for muscle spasms. /cramping  Dispense: 90 tablet; Refill: 1 - traMADol (ULTRAM) 50 MG tablet; Take 1 tablet (50 mg total) by mouth every 6 (six) hours as needed for up to 5 days for moderate  pain.  Dispense: 20 tablet; Refill: 0  Follow Up Instructions:Return for keep follow-up appointment with Dr. [name]; ED if symptoms worsen.    I discussed the assessment and treatment plan with the patient. The patient was provided an opportunity to ask questions and all were answered. The patient agreed with the plan and demonstrated an understanding of the instructions.   The patient was advised to call back or seek an in-person evaluation if the symptoms worsen or if the condition fails to improve as anticipated.  I provided 12 minutes of non-face-to-face time during this encounter.   Antony Blackbird, MD

## 2019-05-11 ENCOUNTER — Other Ambulatory Visit: Payer: Medicaid Other

## 2019-05-14 ENCOUNTER — Encounter (HOSPITAL_COMMUNITY): Payer: Self-pay | Admitting: Emergency Medicine

## 2019-05-14 ENCOUNTER — Emergency Department (HOSPITAL_COMMUNITY)
Admission: EM | Admit: 2019-05-14 | Discharge: 2019-05-14 | Disposition: A | Discharge status: Home / Self Care | Payer: Medicaid Other | Attending: Emergency Medicine | Admitting: Emergency Medicine

## 2019-05-14 ENCOUNTER — Other Ambulatory Visit: Payer: Self-pay

## 2019-05-14 DIAGNOSIS — D649 Anemia, unspecified: Secondary | ICD-10-CM | POA: Insufficient documentation

## 2019-05-14 DIAGNOSIS — R0789 Other chest pain: Secondary | ICD-10-CM | POA: Diagnosis not present

## 2019-05-14 DIAGNOSIS — N921 Excessive and frequent menstruation with irregular cycle: Secondary | ICD-10-CM | POA: Diagnosis not present

## 2019-05-14 DIAGNOSIS — R42 Dizziness and giddiness: Secondary | ICD-10-CM | POA: Diagnosis not present

## 2019-05-14 DIAGNOSIS — Z79899 Other long term (current) drug therapy: Secondary | ICD-10-CM | POA: Diagnosis not present

## 2019-05-14 DIAGNOSIS — R002 Palpitations: Secondary | ICD-10-CM | POA: Diagnosis not present

## 2019-05-14 DIAGNOSIS — Z87891 Personal history of nicotine dependence: Secondary | ICD-10-CM | POA: Insufficient documentation

## 2019-05-14 LAB — BASIC METABOLIC PANEL
Anion gap: 10 (ref 5–15)
BUN: 12 mg/dL (ref 6–20)
CO2: 23 mmol/L (ref 22–32)
Calcium: 8.7 mg/dL — ABNORMAL LOW (ref 8.9–10.3)
Chloride: 109 mmol/L (ref 98–111)
Creatinine, Ser: 0.81 mg/dL (ref 0.44–1.00)
GFR calc Af Amer: >60 mL/min (ref >60)
GFR calc non Af Amer: >60 mL/min (ref >60)
Glucose, Bld: 94 mg/dL (ref 70–99)
Potassium: 3.1 mmol/L — ABNORMAL LOW (ref 3.5–5.1)
Sodium: 142 mmol/L (ref 135–145)

## 2019-05-14 LAB — CBC
HCT: 28 % — ABNORMAL LOW (ref 36.0–46.0)
Hemoglobin: 8.8 g/dL — ABNORMAL LOW (ref 12.0–15.0)
MCH: 28.2 pg (ref 26.0–34.0)
MCHC: 31.4 g/dL (ref 30.0–36.0)
MCV: 89.7 fL (ref 80.0–100.0)
Platelets: 518 10*3/uL — ABNORMAL HIGH (ref 150–400)
RBC: 3.12 MIL/uL — ABNORMAL LOW (ref 3.87–5.11)
RDW: 13.8 % (ref 11.5–15.5)
WBC: 8.6 10*3/uL (ref 4.0–10.5)
nRBC: 0 % (ref 0.0–0.2)

## 2019-05-14 LAB — I-STAT BETA HCG BLOOD, ED (MC, WL, AP ONLY): I-stat hCG, quantitative: <5 m[IU]/mL (ref <5)

## 2019-05-14 MED ORDER — SODIUM CHLORIDE 0.9% FLUSH: 3.0000 mL | Freq: Once | INTRAVENOUS | Status: DC

## 2019-05-14 NOTE — ED Provider Notes (Signed)
[facility] EMERGENCY DEPARTMENT Provider Note   CSN: [id number] Arrival date & time: 05/14/19  1709     History Chief Complaint  Patient presents with  . Dizziness  . Rectal Bleeding  . Vaginal Bleeding    Leah Baker is a 38 y.o. female.  HPI    38 y/o comes in with cc of dizziness and rectal + vaginal bleeding. Pt has hx of menorrhagia and hemorrhoids and has required blood transfusion in the past. She reports that she is having hemorrhoidal and vaginal bleeding with dizziness and palpitation. The bleeding is heavy. She has had megace dose increased few months ago that helped transiently.  At that time she had required blood transfusion. She has seen her GI and gyne for these complaints and is awaiting further diagnostic work-up before her hemorrhoids can be treated surgically.  She is not taking any NSAIDs or aspirin.  She is not on any blood thinners.  Earlier today she had palpitations and dizziness that she got up, symptoms similar to the time when she required transfusion.  Past Medical History:  Diagnosis Date  . Acute costochondritis 11/08/2018   anterior chest wall pain  per pt pcp note in epic on 11-10-2018  . Costochondritis, acute 11/10/2018  . Depression   . Hemorrhoids   . History of lower GI bleeding 08/31/2018   s/p  colonoscopy 09-01-2018,  hemorrhoids  . IDA (iron deficiency anemia)   . Inguinal lymphadenopathy    CT 09-14-2018  right side, followed by pcp  . PONV (postoperative nausea and vomiting)    only happened once  . Wears glasses     Patient Active Problem List   Diagnosis Date Noted  . Heavy menstrual period 11/27/2018  . Rectal bleeding 11/26/2018  . Inguinal lymphadenopathy 11/24/2018  . Right lower quadrant abdominal pain 11/24/2018  . Liver lesion 09/09/2018  . Hemorrhoid 11/29/2014  . Iron deficiency anemia 05/04/2014  . Mood disorder ([place]) 02/20/2014  . Tears of meniscus and ACL of left knee 09/08/2013  .  Depression 05/14/2011  . H/O: cesarean section 08/28/2010    Past Surgical History:  Procedure Laterality Date  . BIOPSY  09/01/2018   Procedure: BIOPSY;  Surgeon: Otis Brace, MD;  Location: [facility];  Service: Gastroenterology;;  . CESAREAN SECTION  01/15/2002   @[facility]   . CESAREAN SECTION  03/17/2011   Procedure: CESAREAN SECTION;  Surgeon: Osborne Oman, MD;  Location: [facility];  Service: Gynecology;  Laterality: N/A;  . COLONOSCOPY WITH PROPOFOL N/A 09/01/2018   Procedure: COLONOSCOPY WITH PROPOFOL;  Surgeon: Otis Brace, MD;  Location: [facility];  Service: Gastroenterology;  Laterality: N/A;  . HYSTEROSCOPY W/D&C  06-21-2009  dr constant @[facility]    w/  REMOVAL IUD   . IUD REMOVAL  06/21/2009  . KNEE ARTHROSCOPY WITH ANTERIOR CRUCIATE LIGAMENT (ACL) REPAIR Left 09-08-2013   dr [name]  @[facility]    w/  meniscal repair  . MULTIPLE TOOTH EXTRACTIONS    . RECTAL EXAM UNDER ANESTHESIA N/A 11/18/2018   Procedure: ANORECTAL EXAM UNDER ANESTHESIA;  Surgeon: Ileana Roup, MD;  Location: [facility];  Service: General;  Laterality: N/A;  . TUBAL LIGATION Bilateral 2012     OB History    Gravida  4   Para  2   Term  2   Preterm  0   AB  2   Living  2     SAB  0   TAB  2   Ectopic  0   Multiple  0   Live Births  2           Family History  Problem Relation Age of Onset  . Cancer Mother   . Kidney failure Mother   . Healthy Father     Social History   Tobacco Use  . Smoking status: Former Smoker    Years: 5.00    Types: Cigarettes    Quit date: 08/22/2008    Years since quitting: 10.7  . Smokeless tobacco: Never Used  Substance Use Topics  . Alcohol use: Yes    Comment: occasionally  . Drug use: Not Currently    Comment: per pt younger in college marijuana, none since    Home Medications Prior to Admission medications   Medication Sig Start Date End Date Taking? Authorizing Provider  acetaminophen (TYLENOL) 325 MG tablet Take  2 tablets (650 mg total) by mouth every 6 (six) hours as needed for mild pain (or Fever >/= 101). 11/27/18   Domenic Polite, MD  ferrous sulfate 325 (65 FE) MG EC tablet Take 1 tablet (325 mg total) by mouth 2 (two) times daily with a meal. 03/30/19   Elsie Stain, MD  megestrol (MEGACE) 40 MG tablet Take 2 tablets (80 mg total) by mouth 2 (two) times daily. 01/26/19   Woodroe Mode, MD  methocarbamol (ROBAXIN) 500 MG tablet Take 1 tablet (500 mg total) by mouth every 8 (eight) hours as needed for muscle spasms. Fuller Song 05/04/19   Fulp, Ander Gaster, MD  Multiple Vitamins-Minerals (WOMENS MULTI VITAMIN & MINERAL PO) Take 1 tablet by mouth daily.    [provider]  psyllium (HYDROCIL/METAMUCIL) 95 % PACK Take 1 packet by mouth daily.    [provider]  sertraline (ZOLOFT) 100 MG tablet Take 1 tablet (100 mg total) by mouth at bedtime. 03/30/19   Elsie Stain, MD    Allergies    Patient has no known allergies.  Review of Systems   Review of Systems  Constitutional: Positive for activity change.  Respiratory: Negative for shortness of breath.   Cardiovascular: Positive for palpitations. Negative for chest pain.  Gastrointestinal: Positive for blood in stool.  Genitourinary: Positive for vaginal bleeding.  Allergic/Immunologic: Negative for immunocompromised state.  Neurological: Positive for dizziness and light-headedness.  Hematological: Does not bruise/bleed easily.  All other systems reviewed and are negative.   Physical Exam Updated Vital Signs BP 111/73 (BP Location: Left Arm)   Pulse 100   Temp 98.6 F (37 C) (Oral)   Resp 16   LMP 04/30/2019   SpO2 100%   Physical Exam Vitals and nursing note reviewed.  Constitutional:      Appearance: She is well-developed.  HENT:     Head: Normocephalic and atraumatic.  Cardiovascular:     Rate and Rhythm: Normal rate.  Pulmonary:     Effort: Pulmonary effort is normal.  Abdominal:     General: Bowel  sounds are normal.  Musculoskeletal:     Cervical back: Normal range of motion and neck supple.  Skin:    General: Skin is warm and dry.  Neurological:     Mental Status: She is alert and oriented to person, place, and time.     ED Results / Procedures / Treatments   Labs (all labs ordered are listed, but only abnormal results are displayed) Labs Reviewed  BASIC METABOLIC PANEL - Abnormal; Notable for the following components:      Result Value   Potassium  3.1 (*)    Calcium 8.7 (*)    All other components within normal limits  CBC - Abnormal; Notable for the following components:   RBC 3.12 (*)    Hemoglobin 8.8 (*)    HCT 28.0 (*)    Platelets 518 (*)    All other components within normal limits  I-STAT BETA HCG BLOOD, ED ([facility], [facility], [facility] ONLY)    EKG EKG Interpretation  Date/Time:  Saturday May 14 2019 17:55:59 EST Ventricular Rate:  99 PR Interval:  116 QRS Duration: 70 QT Interval:  272 QTC Calculation: 349 R Axis:   109 Text Interpretation: Unusual P axis, possible ectopic atrial rhythm Rightward axis Cannot rule out Anterior infarct , age undetermined Abnormal ECG No acute changes No significant change since last tracing Confirmed by Varney Biles [phone]) on 05/14/2019 7:49:13 PM   Radiology No results found.  Procedures Procedures (including critical care time)  Medications Ordered in ED Medications  sodium chloride flush (NS) 0.9 % injection 3 mL (has no administration in time range)    ED Course  I have reviewed the triage vital signs and the nursing notes.  Pertinent labs & imaging results that were available during my care of the patient were reviewed by me and considered in my medical decision making (see chart for details).    MDM Rules/Calculators/A&P                      38 year old comes in a chief complaint of palpitations and dizziness.  She has known history of menorrhagia and hemorrhoidal bleed.  She has required blood transfusion  because of her menorrhagia.  Her hemoglobin is noted to be 8.8.  Drop from 11.1 about a month and a half ago.  I pretty sure that her symptoms were because of the blood loss, however she is not symptomatic at rest and with a hemoglobin over 8 I do not think she needs emergent transfusion.  I am concerned that she will continue to drop her hemoglobin at this rate and end up being much sicker.  After discussing my findings and concerns with her we decided to call patient's gynecology team.    I spoke with Dr. Kennon Rounds, who recommended that we advised patient to go up on her Megace to 80 mg 3 times daily and they will see the patient in the clinic next week.  She is aware of the dropping hemoglobin and possible need for transfusion or IV iron infusion, and she informed me that their clinic will be able to accommodate it if needed.  Final Clinical Impression(s) / ED Diagnoses Final diagnoses:  Menorrhagia with irregular cycle  Palpitations  Anemia, unspecified type    Rx / DC Orders ED Discharge Orders    None       Varney Biles, MD 05/14/19 2037

## 2019-05-14 NOTE — ED Triage Notes (Signed)
Reports dizziness since this morning.  States she has an internal hemorrhoid that has been bleeding intermittently since March and also heavy vaginal bleeding for a few days.  States she feels her heart beating fast and feels that she has lost too much blood.

## 2019-05-14 NOTE — Discharge Instructions (Signed)
We signed the ER for palpitations and dizziness.  It appears that your dizziness is because of the blood loss.  Fortunately, your hemoglobin is not dangerously low at this time.  We have discussed your case with gynecology and they have requested that you increase your Megace to 80 mg 3 times a day. They will also see you in their clinic within a week.  In the interim, be careful when you stand up or when you are exerting yourself. Return to the ER if you have near fainting or fainting spell, severe chest pain or shortness of breath.

## 2019-05-16 ENCOUNTER — Telehealth: Payer: Self-pay | Admitting: Hematology

## 2019-05-16 NOTE — Telephone Encounter (Signed)
R/s appt per 12/14 sch message - unable to reach pt . Left message with appt date and time   

## 2019-05-17 ENCOUNTER — Inpatient Hospital Stay: Payer: Medicaid Other | Admitting: Hematology

## 2019-05-22 NOTE — Progress Notes (Signed)
Subjective:    Patient ID: Leah Baker, female    [name]: 1980-08-29, 38 y.o.   MRN: [id number]  This is a 38 year old female seen back in follow-up for lower GI bleeding, blood loss anemia, internal hemorrhoids, and iliac and right inguinal lymphadenopathy, and now a new problem regarding menorrhagia.  Since last visit the patient required hospitalization with a hemoglobin of 5 and was determined to have excess uterine bleeding in addition to the ongoing hemorrhoidal bleeding from internal hemorrhoids.  The patient continues to have right lower quadrant abdominal pain and right inguinal lymphadenopathy and right iliac lymphadenopathy.  Note previous abdominal CT scans have not revealed pelvic pathology.  The patient has since seen gynecology Dr. Roselie Awkward upon request of the general surgeon.  It appears that the vaginal and abdominal ultrasound will be obtained and is scheduled next week.  Previous ultrasounds of the abdomen and pelvis in 2014 was normal.  There is been a prior history of heavy vaginal bleeding and this is a compounded the iron deficiency anemia.  1 recommendation of the discharge summary was for the patient received intravenous iron.  In the hospitalization she received 2 units of packed cells and 1 dose of 510 mg Feraheme.  Currently the patient shortness of breath is better but her abdominal pain continues to persist.  She has had no additional chest pains.  Note she cannot take nonsteroidal anti-inflammatories due to the bleeding.  Note also the patient is now on Megace in addition to iron supplementation and this has helped to slow down the menstrual bleeding.  The patient also had hypokalemia which needs to be followed up as well  01/18/2019 Since the last office visit the patient had to go to urgent care on August 4 and then had a follow-up with gynecology on August 5 for menorrhagia and continued abdominal pain.  The patient's been maintained on Megace 40 mg twice daily.   General surgery states the internal hemorrhoids do not require further treatment and is not the cause of the abdominal pain.  Gynecology feels that a ablation procedure is indicated for the excess menstrual bleeding.  Patient will first need an endometrial biopsy and this is pending.  The patient is yet to receive her Feraheme injections.  The patient had a hemoglobin checked on August 4 it was stable at 12.7. Since last OV went to [facility]  8/4 and saw Gyn 8/5 for menorrhagia and continued abdominal pain.    03/30/2019 This patient was last seen in August and since that time the patient has had an eventful 2 months.  In September the patient was referred to gynecologic oncology for an endometrial biopsy which was negative for cancer.  General gynecology has determined hormonal therapy is the best for her excess bleeding during periods.  The patient has persistent right inguinal lymphadenopathy and iliac lymphadenopathy.  An MRI had been performed and showed not much change from in April CT scan.  The patient comes in today very frustrated and that she feels that she has been to multiple specialists but does not have a clear answer as to what her condition is.  Patient also appears to be under a great deal of stress and that she has multiple family members including her father who is currently in the intensive care unit at [facility] with severe heart failure.  She had seen oncology and recommendation there was to perform a complete lymph node excision of the right inguinal area and she is been referred  back to [facility] surgery for this.  She tried to go for a visit last week but was denied entrance when her temperature was at 99 degrees.  She does not clearly have any evidence of Covid at this time.  She has a new appointment with surgery upcoming and I am going to be messaging the surgeon about the need for the lymph node biopsy.  She has had multiple blood work done by oncology to determine if there is  an oncologic issue and it is been negative so far.  The hemorrhoidal rectal bleeding appears to be stable and is not a major issue.  The gynecologic oncologist indicated that she did not need Feraheme infusions and upon review of her CBCs through July up into this date all of her hemoglobins have stabilized to the 13 range.  She is on oral iron supplementation 4 times daily.  She has significant GI upset when she takes the oral iron.  The patient is still experiencing abdominal pain in the right lower quadrant she states when she was on tramadol this provided the best relief.  She was offered diclofenac but this is causing excess bleeding therefore this was not taken.  She states her pain is on a scale of 0-10  Is 7    12/21: Since this last visit with this patient in October the patient has been to the emergency room with a hemoglobin of 8.8 several weeks ago and ongoing menorrhagia.  The patient has a pending appointment with gynecology general surgery and gastroenterology in follow-up.  An MRI of the abdomen is now been ordered at this time.  She does take iron supplementation.  She continues to have pain in the lower abdominal area.  She appears to have minimal rectal bleeding the most the bleeding comes from the vagina.  The inguinal lymphadenopathy continues to be a concern and she has been seen by oncology who also is anticipating MRI of the abdomen.  The MRI of the abdomen appears to been scheduled for 30 December  The patient does have some lightheadedness at times.  She has had no syncope as well.     Past Medical History:  Diagnosis Date  . Acute costochondritis 11/08/2018   anterior chest wall pain  per pt pcp note in epic on 11-10-2018  . Costochondritis, acute 11/10/2018  . Depression   . Hemorrhoids   . History of lower GI bleeding 08/31/2018   s/p  colonoscopy 09-01-2018,  hemorrhoids  . IDA (iron deficiency anemia)   . Inguinal lymphadenopathy    CT 09-14-2018  right side,  followed by pcp  . PONV (postoperative nausea and vomiting)    only happened once  . Wears glasses      Family History  Problem Relation Age of Onset  . Cancer Mother   . Kidney failure Mother   . Healthy Father      Social History   Socioeconomic History  . Marital status: Single    Spouse name: Not on file  . Number of children: Not on file  . Years of education: Not on file  . Highest education level: Not on file  Occupational History  . Not on file  Tobacco Use  . Smoking status: Former Smoker    Years: 5.00    Types: Cigarettes    Quit date: 08/22/2008    Years since quitting: 10.7  . Smokeless tobacco: Never Used  Substance and Sexual Activity  . Alcohol use: Yes  Comment: occasionally  . Drug use: Not Currently    Comment: per pt younger in college marijuana, none since  . Sexual activity: Yes    Birth control/protection: Surgical  Other Topics Concern  . Not on file  Social History Narrative  . Not on file   Social Determinants of Health   Financial Resource Strain:   . Difficulty of Paying Living Expenses: Not on file  Food Insecurity:   . Worried About Charity fundraiser in the Last Year: Not on file  . Ran Out of Food in the Last Year: Not on file  Transportation Needs:   . Lack of Transportation (Medical): Not on file  . Lack of Transportation (Non-Medical): Not on file  Physical Activity:   . Days of Exercise per Week: Not on file  . Minutes of Exercise per Session: Not on file  Stress:   . Feeling of Stress : Not on file  Social Connections:   . Frequency of Communication with Friends and Family: Not on file  . Frequency of Social Gatherings with Friends and Family: Not on file  . Attends Religious Services: Not on file  . Active Member of Clubs or Organizations: Not on file  . Attends Archivist Meetings: Not on file  . Marital Status: Not on file  Intimate Partner Violence:   . Fear of Current or Ex-Partner: Not on file  .  Emotionally Abused: Not on file  . Physically Abused: Not on file  . Sexually Abused: Not on file     No Known Allergies   Outpatient Medications Prior to Visit  Medication Sig Dispense Refill  . acetaminophen (TYLENOL) 325 MG tablet Take 2 tablets (650 mg total) by mouth every 6 (six) hours as needed for mild pain (or Fever >/= 101).    . ferrous sulfate 325 (65 FE) MG EC tablet Take 1 tablet (325 mg total) by mouth 2 (two) times daily with a meal. (Patient taking differently: Take 325 mg by mouth 3 (three) times daily with meals. ) 90 tablet 3  . megestrol (MEGACE) 40 MG tablet Take 2 tablets (80 mg total) by mouth 2 (two) times daily. 90 tablet 3  . methocarbamol (ROBAXIN) 500 MG tablet Take 1 tablet (500 mg total) by mouth every 8 (eight) hours as needed for muscle spasms. /cramping 90 tablet 1  . Multiple Vitamins-Minerals (WOMENS MULTI VITAMIN & MINERAL PO) Take 1 tablet by mouth daily.    . psyllium (HYDROCIL/METAMUCIL) 95 % PACK Take 1 packet by mouth daily.    . sertraline (ZOLOFT) 100 MG tablet Take 1 tablet (100 mg total) by mouth at bedtime. 60 tablet 2   No facility-administered medications prior to visit.     Constitutional:   No  weight loss, night sweats,  Fevers, chills, fatigue, lassitude. HEENT:   No headaches,  Difficulty swallowing,  Tooth/dental problems,  Sore throat,                No sneezing, itching, ear ache, nasal congestion, post nasal drip,   CV:   chest pain,  Orthopnea, PND, swelling in lower extremities, anasarca, dizziness, palpitations  GI   heartburn, indigestion, abdominal pain, nausea, vomiting, diarrhea, change in bowel habits, loss of appetite,  BLood in stool  Resp: No shortness of breath with exertion or at rest.  No excess mucus, no productive cough,  No non-productive cough,  No coughing up of blood.  No change in color of mucus.  No wheezing.  No chest wall deformity  Skin: no rash or lesions. GYN: heavy prolonged menses  GU: no dysuria,  change in color of urine, no urgency or frequency.  No flank pain.  MS:  No joint pain or swelling.  No decreased range of motion.  No back pain.  Psych:  No change in mood or affect. No depression or anxiety.  No memory loss.     Objective:   Physical Exam Vitals:   05/23/19 0849  BP: 108/75  Pulse: 96  Resp: 16  SpO2: 100%  Weight: 166 lb 6.4 oz (75.5 kg)    [id number] , frustrated tearful , well-nourished, in no distress,   ENT: No lesions,  mouth clear,  oropharynx clear, no postnasal drip  Neck: No JVD, no TMG, no carotid bruits  Lungs: No use of accessory muscles, no dullness to percussion, clear without rales or rhonchi  Cardiovascular: RRR, heart sounds normal, no murmur or gallops, no peripheral edema   Abdomen: soft tender RLQ  R inguinal LAN  Musculoskeletal: No deformities, no cyanosis or clubbing  Neuro: alert, non focal  Skin: Warm, no lesions or rashes CBC Latest Ref Rng & Units 05/14/2019 03/30/2019 02/28/2019  WBC 4.0 - 10.5 K/uL 8.6 7.5 6.3  Hemoglobin 12.0 - 15.0 g/dL 8.8(L) 11.1 13.1  Hematocrit 36.0 - 46.0 % 28.0(L) 34.1 39.7  Platelets 150 - 400 K/uL 518(H) 413 401(H)   BMP Latest Ref Rng & Units 05/14/2019 02/28/2019 12/08/2018  Glucose 70 - 99 mg/dL 94 95 89  BUN 6 - 20 mg/dL 12 12 9   Creatinine 0.44 - 1.00 mg/dL 0.81 1.04(H) 0.81  BUN/Creat Ratio 9 - 23 - - 11  Sodium 135 - 145 mmol/L 142 138 138  Potassium 3.5 - 5.1 mmol/L 3.1(L) 4.0 4.4  Chloride 98 - 111 mmol/L 109 107 104  CO2 22 - 32 mmol/L 23 25 18(L)  Calcium 8.9 - 10.3 mg/dL 8.7(L) 9.8 9.3   12/16/18 Vag U/S IMPRESSION: 1. RIGHT adnexal mass, discrete from the RIGHT ovary is 1.9 centimeters and avascular on Doppler evaluation. This likely represents enlarged RIGHT external iliac lymph node as seen on previous CT exams of the abdomen and pelvis 09/14/2018 and 08/31/2018. 2. Given the persistence of RIGHT iliac lymph node enlargement, consider biopsy of RIGHT inguinal lymph node,  seen on prior CT exam. 3. Normal endometrial thickness. If bleeding remains unresponsive to hormonal or medical therapy, sonohysterogram should be considered for focal lesion work-up. (Ref: Radiological Reasoning: Algorithmic  Iron level 14- 17  (low normal 28) from 6/27  MRI pelvis:9/22 IMPRESSION: Persistent right inguinal and iliac adenopathy, previously biopsied and within 1-2 mm of size when compared to 09/14/2018. Significance uncertain given unilateral enlargement, unusual for pelvic nodes with reactive changes. Imaging appearance remains nonspecific but again raises the question of lymphoproliferative disorder. Correlate with previous biopsy results with imaging and clinical follow-up as warranted.  Mass on ultrasound likely represented pelvic sidewall/external iliac nodal enlargement on the right.  Mammogram 03/2019 : neg  10/30:  [place] abd: IMPRESSION: 2.6 cm subcapsular left hepatic hypoechoic lesion by ultrasound remains indeterminate and correlates with the CT finding. Recommend follow-up nonemergent evaluation with MRI without and with contrast.  Negative for gallstones or biliary dilatation  No other acute finding by ultrasound.     Assessment & Plan:  I personally reviewed all images and lab data in the [facility] system as well as any outside material available during this office visit and agree with the  radiology impressions.  Rectal bleeding Rectal bleeding appears to be a minimal issue at this time we will continue observation  Inguinal lymphadenopathy Inguinal and right iliac lymphadenopathy likely reactive in nature but cannot rule out malignancy  Previous endometrial biopsy was negative for malignancy  Awaiting follow-up MRI of the abdomen  Depression Anxiety depression has been improved on 100 mg of sertraline will maintain current dose   Heavy menstrual period Heavy menstrual periods continue to persist  Per gynecology and for now will  maintain Megace 80 mg twice daily  Iron deficiency anemia Iron deficiency anemia with drop in hemoglobin to 8.8  Will check iron levels and arrange for a Feraheme infusion at the infusion center x1 dose  Liver lesion Previous liver lesion is nondiagnostic on recent abdominal ultrasound  Pending MRI of the abdomen   Gerrie was seen today for hospitalization follow-up.  Diagnoses and all orders for this visit:  Iron deficiency anemia due to chronic blood loss -     CBC with Differential/Platelet; Future -     Iron  Menorrhagia with regular cycle -     CBC with Differential/Platelet; Future -     Iron  Rectal bleeding  Inguinal lymphadenopathy  Reactive depression  Liver lesion  Other orders -     ferumoxytol (FERAHEME) 510 MG/17ML SOLN injection; Inject 17 mLs (510 mg total) into the vein once for 1 dose.

## 2019-05-23 ENCOUNTER — Other Ambulatory Visit: Payer: Self-pay

## 2019-05-23 ENCOUNTER — Ambulatory Visit: Payer: Medicaid Other | Attending: Critical Care Medicine | Admitting: Critical Care Medicine

## 2019-05-23 ENCOUNTER — Encounter: Payer: Self-pay | Admitting: Critical Care Medicine

## 2019-05-23 VITALS — BP 108/75 | HR 96 | Resp 16 | Wt 166.4 lb

## 2019-05-23 DIAGNOSIS — F418 Other specified anxiety disorders: Secondary | ICD-10-CM | POA: Diagnosis not present

## 2019-05-23 DIAGNOSIS — Z79899 Other long term (current) drug therapy: Secondary | ICD-10-CM | POA: Insufficient documentation

## 2019-05-23 DIAGNOSIS — N920 Excessive and frequent menstruation with regular cycle: Secondary | ICD-10-CM

## 2019-05-23 DIAGNOSIS — R590 Localized enlarged lymph nodes: Secondary | ICD-10-CM | POA: Insufficient documentation

## 2019-05-23 DIAGNOSIS — F329 Major depressive disorder, single episode, unspecified: Secondary | ICD-10-CM

## 2019-05-23 DIAGNOSIS — Z87891 Personal history of nicotine dependence: Secondary | ICD-10-CM | POA: Insufficient documentation

## 2019-05-23 DIAGNOSIS — K769 Liver disease, unspecified: Secondary | ICD-10-CM | POA: Diagnosis not present

## 2019-05-23 DIAGNOSIS — K625 Hemorrhage of anus and rectum: Secondary | ICD-10-CM | POA: Diagnosis not present

## 2019-05-23 DIAGNOSIS — D500 Iron deficiency anemia secondary to blood loss (chronic): Secondary | ICD-10-CM | POA: Diagnosis not present

## 2019-05-23 MED ORDER — FERAHEME 510 MG/17ML IV SOLN: 510.0000 mg | Freq: Once | INTRAVENOUS | 0 refills | Status: DC

## 2019-05-23 NOTE — Assessment & Plan Note (Signed)
Rectal bleeding appears to be a minimal issue at this time we will continue observation

## 2019-05-23 NOTE — Assessment & Plan Note (Signed)
Previous liver lesion is nondiagnostic on recent abdominal ultrasound  Pending MRI of the abdomen

## 2019-05-23 NOTE — Assessment & Plan Note (Signed)
Inguinal and right iliac lymphadenopathy likely reactive in nature but cannot rule out malignancy  Previous endometrial biopsy was negative for malignancy  Awaiting follow-up MRI of the abdomen

## 2019-05-23 NOTE — Patient Instructions (Signed)
We will set up an iron infusion for you at the patient care center an appointment will be made  Labs today include iron level and complete blood count  Keep your next follow-up visit with your primary care provider Dr. Chapman Fitch  Keep your MRI which is scheduled for December 30 and your follow-up appointments with oncology and gynecology and general surgery and gastroenterology

## 2019-05-23 NOTE — Assessment & Plan Note (Signed)
Anxiety depression has been improved on 100 mg of sertraline will maintain current dose

## 2019-05-23 NOTE — Assessment & Plan Note (Signed)
Iron deficiency anemia with drop in hemoglobin to 8.8  Will check iron levels and arrange for a Feraheme infusion at the infusion center x1 dose

## 2019-05-23 NOTE — Assessment & Plan Note (Signed)
Heavy menstrual periods continue to persist  Per gynecology and for now will maintain Megace 80 mg twice daily

## 2019-05-24 ENCOUNTER — Telehealth: Payer: Self-pay | Admitting: Internal Medicine

## 2019-05-24 ENCOUNTER — Other Ambulatory Visit: Payer: Self-pay

## 2019-05-24 ENCOUNTER — Encounter (HOSPITAL_COMMUNITY): Payer: Self-pay | Admitting: Emergency Medicine

## 2019-05-24 ENCOUNTER — Emergency Department (HOSPITAL_COMMUNITY)
Admission: EM | Admit: 2019-05-24 | Discharge: 2019-05-24 | Disposition: A | Discharge status: Home / Self Care | Payer: Medicaid Other | Attending: Emergency Medicine | Admitting: Emergency Medicine

## 2019-05-24 DIAGNOSIS — D509 Iron deficiency anemia, unspecified: Secondary | ICD-10-CM | POA: Diagnosis present

## 2019-05-24 DIAGNOSIS — Z79899 Other long term (current) drug therapy: Secondary | ICD-10-CM | POA: Insufficient documentation

## 2019-05-24 DIAGNOSIS — R42 Dizziness and giddiness: Secondary | ICD-10-CM | POA: Diagnosis not present

## 2019-05-24 DIAGNOSIS — D75839 Thrombocytosis, unspecified: Secondary | ICD-10-CM | POA: Diagnosis present

## 2019-05-24 DIAGNOSIS — D649 Anemia, unspecified: Secondary | ICD-10-CM | POA: Diagnosis not present

## 2019-05-24 DIAGNOSIS — R531 Weakness: Secondary | ICD-10-CM | POA: Insufficient documentation

## 2019-05-24 DIAGNOSIS — Z20828 Contact with and (suspected) exposure to other viral communicable diseases: Secondary | ICD-10-CM | POA: Insufficient documentation

## 2019-05-24 DIAGNOSIS — D500 Iron deficiency anemia secondary to blood loss (chronic): Secondary | ICD-10-CM | POA: Diagnosis not present

## 2019-05-24 DIAGNOSIS — D473 Essential (hemorrhagic) thrombocythemia: Secondary | ICD-10-CM | POA: Diagnosis not present

## 2019-05-24 DIAGNOSIS — Z87891 Personal history of nicotine dependence: Secondary | ICD-10-CM | POA: Insufficient documentation

## 2019-05-24 DIAGNOSIS — R102 Pelvic and perineal pain: Secondary | ICD-10-CM | POA: Insufficient documentation

## 2019-05-24 DIAGNOSIS — K649 Unspecified hemorrhoids: Secondary | ICD-10-CM | POA: Diagnosis not present

## 2019-05-24 DIAGNOSIS — R000 Tachycardia, unspecified: Secondary | ICD-10-CM | POA: Diagnosis not present

## 2019-05-24 DIAGNOSIS — D62 Acute posthemorrhagic anemia: Secondary | ICD-10-CM | POA: Diagnosis present

## 2019-05-24 DIAGNOSIS — N920 Excessive and frequent menstruation with regular cycle: Secondary | ICD-10-CM | POA: Diagnosis not present

## 2019-05-24 DIAGNOSIS — R599 Enlarged lymph nodes, unspecified: Secondary | ICD-10-CM | POA: Insufficient documentation

## 2019-05-24 DIAGNOSIS — N939 Abnormal uterine and vaginal bleeding, unspecified: Secondary | ICD-10-CM | POA: Diagnosis not present

## 2019-05-24 DIAGNOSIS — K625 Hemorrhage of anus and rectum: Secondary | ICD-10-CM | POA: Diagnosis present

## 2019-05-24 LAB — PREPARE RBC (CROSSMATCH)

## 2019-05-24 LAB — FERRITIN: Ferritin: 3 ng/mL — ABNORMAL LOW (ref 11–307)

## 2019-05-24 LAB — CBC WITH DIFFERENTIAL/PLATELET
Abs Immature Granulocytes: 0.03 10*3/uL (ref 0.00–0.07)
Basophils Absolute: 0.1 10*3/uL (ref 0.0–0.2)
Basophils Absolute: 0.1 10*3/uL (ref 0.0–0.1)
Basophils Relative: 1 %
Basos: 1 %
EOS (ABSOLUTE): 0.3 10*3/uL (ref 0.0–0.4)
Eos: 4 %
Eosinophils Absolute: 0.3 10*3/uL (ref 0.0–0.5)
Eosinophils Relative: 4 %
HCT: 21.3 % — ABNORMAL LOW (ref 36.0–46.0)
Hematocrit: 21.5 % — ABNORMAL LOW (ref 34.0–46.6)
Hemoglobin: 6.7 g/dL — CL (ref 11.1–15.9)
Hemoglobin: 6.5 g/dL — CL (ref 12.0–15.0)
Immature Grans (Abs): 0.1 10*3/uL (ref 0.0–0.1)
Immature Granulocytes: 1 %
Immature Granulocytes: 1 %
Lymphocytes Absolute: 3.6 10*3/uL — ABNORMAL HIGH (ref 0.7–3.1)
Lymphocytes Relative: 39 %
Lymphs Abs: 2.6 10*3/uL (ref 0.7–4.0)
Lymphs: 38 %
MCH: 27.5 pg (ref 26.6–33.0)
MCH: 27 pg (ref 26.0–34.0)
MCHC: 31.2 g/dL — ABNORMAL LOW (ref 31.5–35.7)
MCHC: 30.5 g/dL (ref 30.0–36.0)
MCV: 88 fL (ref 79–97)
MCV: 88.4 fL (ref 80.0–100.0)
Monocytes Absolute: 0.7 10*3/uL (ref 0.1–0.9)
Monocytes Absolute: 0.5 10*3/uL (ref 0.1–1.0)
Monocytes Relative: 8 %
Monocytes: 8 %
Neutro Abs: 3.2 10*3/uL (ref 1.7–7.7)
Neutrophils Absolute: 4.6 10*3/uL (ref 1.4–7.0)
Neutrophils Relative %: 47 %
Neutrophils: 48 %
Platelets: 675 10*3/uL — ABNORMAL HIGH (ref 150–450)
Platelets: 656 10*3/uL — ABNORMAL HIGH (ref 150–400)
RBC: 2.44 x10E6/uL — CL (ref 3.77–5.28)
RBC: 2.41 MIL/uL — ABNORMAL LOW (ref 3.87–5.11)
RDW: 13.6 % (ref 11.7–15.4)
RDW: 14.2 % (ref 11.5–15.5)
WBC: 9.3 10*3/uL (ref 3.4–10.8)
WBC: 6.7 10*3/uL (ref 4.0–10.5)
nRBC: 0 % (ref 0.0–0.2)

## 2019-05-24 LAB — IRON AND TIBC
Iron: 10 ug/dL — ABNORMAL LOW (ref 28–170)
Saturation Ratios: 2 % — ABNORMAL LOW (ref 10.4–31.8)
TIBC: 465 ug/dL — ABNORMAL HIGH (ref 250–450)
UIBC: 455 ug/dL

## 2019-05-24 LAB — FOLATE: Folate: 13.9 ng/mL (ref >5.9)

## 2019-05-24 LAB — COMPREHENSIVE METABOLIC PANEL
ALT: 15 U/L (ref 0–44)
AST: 18 U/L (ref 15–41)
Albumin: 3.5 g/dL (ref 3.5–5.0)
Alkaline Phosphatase: 40 U/L (ref 38–126)
Anion gap: 8 (ref 5–15)
BUN: 9 mg/dL (ref 6–20)
CO2: 20 mmol/L — ABNORMAL LOW (ref 22–32)
Calcium: 8.6 mg/dL — ABNORMAL LOW (ref 8.9–10.3)
Chloride: 111 mmol/L (ref 98–111)
Creatinine, Ser: 0.84 mg/dL (ref 0.44–1.00)
GFR calc Af Amer: >60 mL/min (ref >60)
GFR calc non Af Amer: >60 mL/min (ref >60)
Glucose, Bld: 89 mg/dL (ref 70–99)
Potassium: 3.6 mmol/L (ref 3.5–5.1)
Sodium: 139 mmol/L (ref 135–145)
Total Bilirubin: 0.3 mg/dL (ref 0.3–1.2)
Total Protein: 6 g/dL — ABNORMAL LOW (ref 6.5–8.1)

## 2019-05-24 LAB — RETICULOCYTES
Immature Retic Fract: 34.8 % — ABNORMAL HIGH (ref 2.3–15.9)
RBC.: 2.4 MIL/uL — ABNORMAL LOW (ref 3.87–5.11)
Retic Count, Absolute: 73.8 10*3/uL (ref 19.0–186.0)
Retic Ct Pct: 3.1 % (ref 0.4–3.1)

## 2019-05-24 LAB — VITAMIN B12: Vitamin B-12: 216 pg/mL (ref 180–914)

## 2019-05-24 LAB — IRON: Iron: 9 ug/dL — CL (ref 27–159)

## 2019-05-24 LAB — SARS CORONAVIRUS 2 (TAT 6-24 HRS): SARS Coronavirus 2: NEGATIVE

## 2019-05-24 MED ORDER — MORPHINE SULFATE (PF) 4 MG/ML IV SOLN
4.0000 mg | Freq: Once | INTRAVENOUS | Status: AC
  Administered 2019-05-24: 4 mg via INTRAVENOUS
  Filled 2019-05-24: qty 1

## 2019-05-24 MED ORDER — SODIUM CHLORIDE 0.9 % IV SOLN: 10.0000 mL/h | Freq: Once | INTRAVENOUS | Status: DC

## 2019-05-24 MED ORDER — SODIUM CHLORIDE 0.9 % IV SOLN
510.0000 mg | Freq: Once | INTRAVENOUS | Status: AC
  Administered 2019-05-24: 510 mg via INTRAVENOUS
  Filled 2019-05-24: qty 17

## 2019-05-24 MED ORDER — ONDANSETRON HCL 4 MG/2ML IJ SOLN
4.0000 mg | Freq: Once | INTRAMUSCULAR | Status: AC
  Administered 2019-05-24: 4 mg via INTRAVENOUS
  Filled 2019-05-24: qty 2

## 2019-05-24 MED ORDER — SODIUM CHLORIDE 0.9 % IV BOLUS
1000.0000 mL | Freq: Once | INTRAVENOUS | Status: AC
  Administered 2019-05-24: 11:00:00 1000 mL via INTRAVENOUS

## 2019-05-24 MED ORDER — SODIUM CHLORIDE 0.9 % IV SOLN
10.0000 mL/h | Freq: Once | INTRAVENOUS | Status: AC
  Administered 2019-05-24: 10 mL/h via INTRAVENOUS

## 2019-05-24 NOTE — Telephone Encounter (Signed)
Several attempts where made to call the Pt to make a appointment for a infusion I left a message to call the office to schedule

## 2019-05-24 NOTE — ED Triage Notes (Signed)
Pt went to PCP yesterday for vaginal bleeding and hemorrhoids.  Received call this morning that Hgb was 6.7.  Dr. [name] called while RN triaging pt and states he sent her to ED to get blood transfusion.  C/o dizziness.

## 2019-05-24 NOTE — ED Provider Notes (Addendum)
[facility] EMERGENCY DEPARTMENT Provider Note   CSN: [id number] Arrival date & time: 05/24/19  [id number]     History Chief Complaint  Patient presents with  . abnormal labs  . Vaginal Bleeding    Leah Baker is a 38 y.o. female.  38 year old female with history of anemia with recent vaginal and rectal bleeding presents with hemoglobin of 6.7 drawn at PCP office yesterday, called and advised to come to the emergency room.  Patient states she is not had any vaginal bleeding for the past 2 days, had her Megace dose increased at her last ER visit on December 12.  Patient reports no rectal bleeding for the past several days, relates this to decreased stool output from decreased p.o. intake recently.  Patient has had transfusions in the past, no prior reactions with transfusion.  Patient states that she has been dizzy for the last 2 days and more fatigued.  Patient woke up this morning to go to the bathroom and had to steady herself in bed for a moment before she could leave the bed, patient received phone call later this morning advising her to come to the ER for her anemia.  Also reports ongoing pelvic cramping, states when she has her pelvic cramping it makes the pain in her lymph node worse.  Patient is scheduled to have an MRI of her pelvis later this month for right inguinal lymphadenopathy, states that she had a colonoscopy in April that showed an internal hemorrhoid thought to be the source of her bleeding, not removed until gynecology had evaluated her for her inguinal lymphadenopathy.  Patient denies chest pain, shortness of breath, fevers.  No other complaints or concerns.        Past Medical History:  Diagnosis Date  . Acute costochondritis 11/08/2018   anterior chest wall pain  per pt pcp note in epic on 11-10-2018  . Costochondritis, acute 11/10/2018  . Depression   . Hemorrhoids   . History of lower GI bleeding 08/31/2018   s/p  colonoscopy 09-01-2018,   hemorrhoids  . IDA (iron deficiency anemia)   . Inguinal lymphadenopathy    CT 09-14-2018  right side, followed by pcp  . PONV (postoperative nausea and vomiting)    only happened once  . Wears glasses     Patient Active Problem List   Diagnosis Date Noted  . Acute blood loss anemia 05/24/2019  . Thrombocytosis ([place]) 05/24/2019  . Menorrhagia 11/27/2018  . Rectal bleeding 11/26/2018  . Inguinal lymphadenopathy 11/24/2018  . Right lower quadrant abdominal pain 11/24/2018  . Liver lesion 09/09/2018  . Hemorrhoid 11/29/2014  . Iron deficiency anemia 05/04/2014  . Mood disorder ([place]) 02/20/2014  . Tears of meniscus and ACL of left knee 09/08/2013  . Depression 05/14/2011  . H/O: cesarean section 08/28/2010    Past Surgical History:  Procedure Laterality Date  . BIOPSY  09/01/2018   Procedure: BIOPSY;  Surgeon: Otis Brace, MD;  Location: [facility];  Service: Gastroenterology;;  . CESAREAN SECTION  01/15/2002   @[facility]   . CESAREAN SECTION  03/17/2011   Procedure: CESAREAN SECTION;  Surgeon: Osborne Oman, MD;  Location: [facility];  Service: Gynecology;  Laterality: N/A;  . COLONOSCOPY WITH PROPOFOL N/A 09/01/2018   Procedure: COLONOSCOPY WITH PROPOFOL;  Surgeon: Otis Brace, MD;  Location: [facility];  Service: Gastroenterology;  Laterality: N/A;  . HYSTEROSCOPY WITH D & C  06-21-2009  dr constant @[facility]    w/  REMOVAL IUD   . IUD  REMOVAL  06/21/2009  . KNEE ARTHROSCOPY WITH ANTERIOR CRUCIATE LIGAMENT (ACL) REPAIR Left 09-08-2013   dr [name]  @[facility]    w/  meniscal repair  . MULTIPLE TOOTH EXTRACTIONS    . RECTAL EXAM UNDER ANESTHESIA N/A 11/18/2018   Procedure: ANORECTAL EXAM UNDER ANESTHESIA;  Surgeon: Ileana Roup, MD;  Location: [facility];  Service: General;  Laterality: N/A;  . TUBAL LIGATION Bilateral 2012     OB History    Gravida  4   Para  2   Term  2   Preterm  0   AB  2   Living  2     SAB  0   TAB  2   Ectopic  0    Multiple  0   Live Births  2           Family History  Problem Relation Age of Onset  . Cancer Mother   . Kidney failure Mother   . Healthy Father     Social History   Tobacco Use  . Smoking status: Former Smoker    Years: 5.00    Types: Cigarettes    Quit date: 08/22/2008    Years since quitting: 10.7  . Smokeless tobacco: Never Used  Substance Use Topics  . Alcohol use: Yes    Comment: occasionally  . Drug use: Not Currently    Comment: per pt younger in college marijuana, none since    Home Medications Prior to Admission medications   Medication Sig Start Date End Date Taking? Authorizing Provider  acetaminophen (TYLENOL) 325 MG tablet Take 2 tablets (650 mg total) by mouth every 6 (six) hours as needed for mild pain (or Fever >/= 101). 11/27/18  Yes Domenic Polite, MD  ferrous sulfate 325 (65 FE) MG EC tablet Take 1 tablet (325 mg total) by mouth 2 (two) times daily with a meal. Patient taking differently: Take 325 mg by mouth 3 (three) times daily with meals.  03/30/19  Yes Elsie Stain, MD  megestrol (MEGACE) 40 MG tablet Take 2 tablets (80 mg total) by mouth 2 (two) times daily. Patient taking differently: Take 80 mg by mouth 3 (three) times daily.  01/26/19  Yes Woodroe Mode, MD  methocarbamol (ROBAXIN) 500 MG tablet Take 1 tablet (500 mg total) by mouth every 8 (eight) hours as needed for muscle spasms. Fuller Song 05/04/19  Yes Fulp, Cammie, MD  Multiple Vitamins-Minerals (WOMENS MULTI VITAMIN & MINERAL PO) Take 1 tablet by mouth daily.   Yes [provider]  psyllium (HYDROCIL/METAMUCIL) 95 % PACK Take 1 packet by mouth daily.   Yes [provider]  sertraline (ZOLOFT) 100 MG tablet Take 1 tablet (100 mg total) by mouth at bedtime. 03/30/19  Yes Elsie Stain, MD  ferumoxytol [facility]) 510 MG/17ML SOLN injection Inject 17 mLs (510 mg total) into the vein once for 1 dose. 05/23/19 05/23/19  Elsie Stain, MD    Allergies      Patient has no known allergies.  Review of Systems   Review of Systems  Constitutional: Positive for fatigue. Negative for chills, diaphoresis and fever.  Respiratory: Negative for shortness of breath.   Cardiovascular: Negative for chest pain.  Gastrointestinal: Negative for constipation, diarrhea, nausea and vomiting.  Genitourinary: Positive for pelvic pain. Negative for dysuria and frequency.  Musculoskeletal: Negative for arthralgias and myalgias.  Allergic/Immunologic: Negative for immunocompromised state.  Neurological: Positive for dizziness and weakness.  Hematological: Positive for adenopathy.  Psychiatric/Behavioral:  Negative for confusion.  All other systems reviewed and are negative.   Physical Exam Updated Vital Signs BP 116/67   Pulse 88   Temp 99.1 F (37.3 C) (Oral)   Resp (!) 22   LMP 04/30/2019   SpO2 100%   Physical Exam Vitals and nursing note reviewed.  Constitutional:      General: She is not in acute distress.    Appearance: She is well-developed. She is not diaphoretic.  HENT:     Head: Normocephalic and atraumatic.  Eyes:     Comments: Pale conjunctiva  Cardiovascular:     Rate and Rhythm: Regular rhythm. Tachycardia present.     Pulses: Normal pulses.     Heart sounds: Normal heart sounds.  Pulmonary:     Effort: Pulmonary effort is normal.     Breath sounds: Normal breath sounds.  Abdominal:     Palpations: Abdomen is soft.     Tenderness: There is abdominal tenderness.  Musculoskeletal:     Right lower leg: No edema.     Left lower leg: No edema.  Lymphadenopathy:     Comments: Tender right inguinal lymphadenopathy without overlying erythema.  Skin:    General: Skin is warm and dry.     Coloration: Skin is pale.     Findings: No rash.  Neurological:     Mental Status: She is alert and oriented to person, place, and time.  Psychiatric:        Behavior: Behavior normal.     ED Results / Procedures / Treatments   Labs (all  labs ordered are listed, but only abnormal results are displayed) Labs Reviewed  IRON AND TIBC - Abnormal; Notable for the following components:      Result Value   Iron 10 (*)    TIBC 465 (*)    Saturation Ratios 2 (*)    All other components within normal limits  FERRITIN - Abnormal; Notable for the following components:   Ferritin 3 (*)    All other components within normal limits  RETICULOCYTES - Abnormal; Notable for the following components:   RBC. 2.40 (*)    Immature Retic Fract 34.8 (*)    All other components within normal limits  COMPREHENSIVE METABOLIC PANEL - Abnormal; Notable for the following components:   CO2 20 (*)    Calcium 8.6 (*)    Total Protein 6.0 (*)    All other components within normal limits  CBC WITH DIFFERENTIAL/PLATELET - Abnormal; Notable for the following components:   RBC 2.41 (*)    Hemoglobin 6.5 (*)    HCT 21.3 (*)    Platelets 656 (*)    All other components within normal limits  SARS CORONAVIRUS 2 (TAT 6-24 HRS)  VITAMIN B12  FOLATE  TYPE AND SCREEN  PREPARE RBC (CROSSMATCH)  PREPARE RBC (CROSSMATCH)    EKG None  Radiology No results found.  Procedures .Critical Care Performed by: Tacy Learn, PA-C Authorized by: Tacy Learn, PA-C   Critical care provider statement:    Critical care time (minutes):  45   Critical care was time spent personally by me on the following activities:  Discussions with consultants, evaluation of patient's response to treatment, examination of patient, ordering and performing treatments and interventions, ordering and review of laboratory studies, ordering and review of radiographic studies, pulse oximetry, re-evaluation of patient's condition, obtaining history from patient or surrogate and review of old charts   (including critical care time)  Medications Ordered in  ED Medications  0.9 %  sodium chloride infusion (has no administration in time range)  ondansetron (ZOFRAN) injection 4 mg (4  mg Intravenous Given 05/24/19 1053)  morphine 4 MG/ML injection 4 mg (4 mg Intravenous Given 05/24/19 1053)  sodium chloride 0.9 % bolus 1,000 mL (0 mLs Intravenous Stopped 05/24/19 1217)  0.9 %  sodium chloride infusion (0 mL/hr Intravenous Stopped 05/24/19 1421)  ferumoxytol (FERAHEME) 510 mg in sodium chloride 0.9 % 100 mL IVPB (0 mg Intravenous Stopped 05/24/19 1423)    ED Course  I have reviewed the triage vital signs and the nursing notes.  Pertinent labs & imaging results that were available during my care of the patient were reviewed by me and considered in my medical decision making (see chart for details).  Clinical Course as of May 23 1437  Tue May 23, 8018  7539 38 year old female presents with fatigue and dizziness, worse than baseline, x2 days.  Patient saw PCP yesterday for labs and was called this morning and told her hemoglobin was 6.7 and needed to go to the ER.  Patient has been worked up by her PCP for inguinal lymphadenopathy, vaginal bleeding, rectal bleeding.  Patient was found to have internal hemorrhoid on colonoscopy that was not removed, wanted evaluation of her inguinal and adenopathy prior to removal of internal hemorrhoid.  Patient states she has not had vaginal bleeding for the past 2 days since increasing her Megace dose, has not had rectal bleeding and relates this to decreased p.o. intake.  The bleeding is described as bright red when it does occur, not mixed with stools.  Patient is scheduled for pelvic MRI the end of the month to evaluate her inguinal lymphadenopathy.  On exam patient is pale, has pale conjunctiva, mild tachycardia and lower pelvic tenderness.  Reports has had pelvic cramping. Hemoglobin returns and is 6.5 today, ordered 2 units of packed red blood cells and consulted hospitalist service for admission.  Case discussed with Dr. Tamala Julian who will consult for admission.   [LM]  1434 Patient was seen by Dr. Tamala Julian with [facility] hospitalist service, patient  reportedly would like to go home today and does not want to be admitted to the hospital.  Dr. Tamala Julian has changed around the orders, patient will be getting 1 unit of packed red blood cells with an iron transfusion.  If patient requires further admission please call back. Patient has gotten 1 unit packed red blood cells as well as her iron transfusion, states that she feels a little bit improved.  Patient is concerned that she is going to have more bleeding and have to come back to the hospital.  Discussed with patient there is no way to know if she will have further bleeding, she needs her MRI and further work-up to determine best treatment for her vaginal and rectal bleeding. Case discussed with Dr. Rip Harbour with gynecology service, recommends an additional unit of packed red blood cells for a total of 2 units today along with the iron infusion patient has already gotten with plan for follow-up with gynecology tomorrow as previously planned, does not feel patient needs to be admitted at this.  Discussed with patient who is agreeable.  Patient will receive 1 additional unit of packed red blood cells for a total of 2 units, plan is to reassess at that time and likely discharge if patient is agreeable with plan.   [LM]    Clinical Course User Index [LM] Roque Lias   MDM Rules/Calculators/A&P  Final Clinical Impression(s) / ED Diagnoses Final diagnoses:  Anemia, unspecified type    Rx / DC Orders ED Discharge Orders    None       Tacy Learn, PA-C 05/24/19 1054    Tacy Learn, PA-C 05/24/19 1438    Quintella Reichert, MD 05/25/19 [phone]

## 2019-05-24 NOTE — Consult Note (Signed)
Medical Consultation   Leah Baker  [id number]  [name]: 1981-03-11  DOA: 05/24/2019  PCP: Antony Blackbird, MD   Outpatient Specialists: Dr. Kennon Rounds OB/GYN, Dr. Irene Limbo oncology  Requesting physician: Suella Broad, PA-C  Reason for consultation: Acute blood loss anemia  History of Present Illness: Leah Baker is an 38 y.o. female iron deficiency anemia, internal hemorrhoids, menorrhagia, inguinal lymphadenopathy, and depression.  She presents with complaints of low hemoglobin 6.7 drawn at her PCP office yesterday.  Patient reports that she had been having vaginal bleeding off and on over several months now.  She was seen in the emergency department on 12/12 for vaginal bleeding with hemoglobin dropping from 11.1 down to 8.8.  She was advised to increase her Megace to 80 mg 3 times daily.  Patient reports still having intermittent vaginal bleeding up until 2 days ago when it stopped.  Other associated symptoms included reports of fatigue/malaise and intermittent rectal bleeding for which she has started back using the suppositories.  Patient had a colonoscopy where this was discovered that she had bleeding internal hemorrhoids in 09/2018.  She followed up with her primary care provider yesterday where they obtained repeat blood work.  Patient reports feeling lightheaded and dizzy whenever getting up to moving around.  Her follow-up appointment for OB/GYN has set up for tomorrow and she is scheduled to have MRI of the pelvis later this month for right inguinal lymphadenopathy.   ED Course: Upon admission into the emergency department patient was seen to be tachycardic all other vital signs stable.  Labs significant for hemoglobin 6.5 and platelets 656.  Patient was ordered 2 units of packed red blood cells.  [facility] called to admit, but patient would rather be discharged home if possible.       Review of Systems:  Review of Systems  Constitutional: Positive for malaise/fatigue.  Negative for fever.  HENT: Negative for congestion and ear pain.   Eyes: Negative for double vision and photophobia.  Respiratory: Negative for cough and shortness of breath.   Cardiovascular: Negative for chest pain and leg swelling.  Gastrointestinal: Positive for abdominal pain and blood in stool. Negative for nausea and vomiting.  Genitourinary: Negative for dysuria and hematuria.  Musculoskeletal: Negative for falls.  Skin: Negative for itching.  Neurological: Negative for focal weakness and loss of consciousness.  Psychiatric/Behavioral: Negative for substance abuse.   As per HPI otherwise 10 point review of systems negative.     Past Medical History: Past Medical History:  Diagnosis Date  . Acute costochondritis 11/08/2018   anterior chest wall pain  per pt pcp note in epic on 11-10-2018  . Costochondritis, acute 11/10/2018  . Depression   . Hemorrhoids   . History of lower GI bleeding 08/31/2018   s/p  colonoscopy 09-01-2018,  hemorrhoids  . IDA (iron deficiency anemia)   . Inguinal lymphadenopathy    CT 09-14-2018  right side, followed by pcp  . PONV (postoperative nausea and vomiting)    only happened once  . Wears glasses     Past Surgical History: Past Surgical History:  Procedure Laterality Date  . BIOPSY  09/01/2018   Procedure: BIOPSY;  Surgeon: Otis Brace, MD;  Location: [facility];  Service: Gastroenterology;;  . CESAREAN SECTION  01/15/2002   @[facility]   . CESAREAN SECTION  03/17/2011   Procedure: CESAREAN SECTION;  Surgeon: Osborne Oman, MD;  Location: [facility];  Service:  Gynecology;  Laterality: N/A;  . COLONOSCOPY WITH PROPOFOL N/A 09/01/2018   Procedure: COLONOSCOPY WITH PROPOFOL;  Surgeon: Otis Brace, MD;  Location: [facility];  Service: Gastroenterology;  Laterality: N/A;  . HYSTEROSCOPY WITH D & C  06-21-2009  dr constant @[facility]    w/  REMOVAL IUD   . IUD REMOVAL  06/21/2009  . KNEE ARTHROSCOPY WITH ANTERIOR CRUCIATE LIGAMENT (ACL) REPAIR Left  09-08-2013   dr [name]  @[facility]    w/  meniscal repair  . MULTIPLE TOOTH EXTRACTIONS    . RECTAL EXAM UNDER ANESTHESIA N/A 11/18/2018   Procedure: ANORECTAL EXAM UNDER ANESTHESIA;  Surgeon: Ileana Roup, MD;  Location: [facility];  Service: General;  Laterality: N/A;  . TUBAL LIGATION Bilateral 2012     Allergies:  No Known Allergies   Social History:  reports that she quit smoking about 10 years ago. Her smoking use included cigarettes. She quit after 5.00 years of use. She has never used smokeless tobacco. She reports current alcohol use. She reports previous drug use.   Family History: Family History  Problem Relation Age of Onset  . Cancer Mother   . Kidney failure Mother   . Healthy Father       Physical Exam: Vitals:   05/24/19 0831  BP: 113/85  Pulse: (!) 111  Resp: 16  Temp: 98.2 F (36.8 C)  TempSrc: Oral  SpO2: 100%    Constitutional: Middle-aged female who alert and awake, oriented x3, not in any acute distress. Eyes: PERLA, EOMI, irises appear normal, anicteric sclera,  ENMT: external ears and nose appear normal. Lips appears normal, oropharynx mucosa, tongue, posterior pharynx appear normal  Neck: neck appears normal, no masses, normal ROM, no thyromegaly, no JVD  CVS: Tachycardic, no murmur rubs or gallops, no LE edema, normal pedal pulses  Respiratory:  clear to auscultation bilaterally, no wheezing, rales or rhonchi. Respiratory effort normal. No accessory muscle use.  Abdomen: soft nontender, nondistended, normal bowel sounds, no hepatosplenomegaly, no hernias  Musculoskeletal: : no cyanosis, clubbing or edema noted bilaterally Neuro: Cranial nerves II-XII intact, strength, sensation, reflexes Psych: judgement and insight appear normal, stable mood and affect, mental status Skin: no rashes or lesions or ulcers, no induration or nodules   Data reviewed:  I have personally reviewed following labs and imaging studies Labs:   CBC: Recent Labs  Lab 05/23/19 0915 05/24/19 0910  WBC 9.3 6.7  NEUTROABS 4.6 3.2  HGB 6.7* 6.5*  HCT 21.5* 21.3*  MCV 88 88.4  PLT 675* 656*    Basic Metabolic Panel: Recent Labs  Lab 05/24/19 0910  NA 139  K 3.6  CL 111  CO2 20*  GLUCOSE 89  BUN 9  CREATININE 0.84  CALCIUM 8.6*   GFR Estimated Creatinine Clearance: 84.7 mL/min (by C-G formula based on SCr of 0.84 mg/dL). Liver Function Tests: Recent Labs  Lab 05/24/19 0910  AST 18  ALT 15  ALKPHOS 40  BILITOT 0.3  PROT 6.0*  ALBUMIN 3.5   No results for input(s): LIPASE, AMYLASE in the last 168 hours. No results for input(s): AMMONIA in the last 168 hours. Coagulation profile No results for input(s): INR, PROTIME in the last 168 hours.  Cardiac Enzymes: No results for input(s): CKTOTAL, CKMB, CKMBINDEX, TROPONINI in the last 168 hours. BNP: Invalid input(s): POCBNP CBG: No results for input(s): GLUCAP in the last 168 hours. D-Dimer No results for input(s): DDIMER in the last 72 hours. Hgb A1c No results for input(s): HGBA1C in  the last 72 hours. Lipid Profile No results for input(s): CHOL, HDL, LDLCALC, TRIG, CHOLHDL, LDLDIRECT in the last 72 hours. Thyroid function studies No results for input(s): TSH, T4TOTAL, T3FREE, THYROIDAB in the last 72 hours.  Invalid input(s): FREET3 Anemia work up Recent Labs    05/23/19 0911 05/24/19 0910  VITAMINB12  --  216  FOLATE  --  13.9  FERRITIN  --  3*  TIBC  --  465*  IRON 9* 10*  RETICCTPCT  --  3.1   Urinalysis    Component Value Date/Time   COLORURINE YELLOW 02/22/2013 1700   APPEARANCEUR HAZY (A) 02/22/2013 1700   LABSPEC 1.025 01/04/2019 0835   PHURINE 7.0 01/04/2019 0835   GLUCOSEU NEGATIVE 01/04/2019 0835   HGBUR MODERATE (A) 01/04/2019 0835   HGBUR moderate 05/01/2009 0959   BILIRUBINUR negative 02/24/2019 0907   BILIRUBINUR NEG 08/23/2010 1459   KETONESUR negative 02/24/2019 0907   KETONESUR NEGATIVE 01/04/2019 0835   PROTEINUR  NEGATIVE 01/04/2019 0835   UROBILINOGEN 0.2 02/24/2019 0907   UROBILINOGEN 0.2 01/04/2019 0835   NITRITE Negative 02/24/2019 0907   NITRITE NEGATIVE 01/04/2019 0835   LEUKOCYTESUR Negative 02/24/2019 0907   LEUKOCYTESUR NEGATIVE 01/04/2019 0835     Microbiology No results found for this or any previous visit (from the past 240 hour(s)).     Inpatient Medications:   Scheduled Meds: Continuous Infusions: . sodium chloride    . ferumoxytol       Radiological Exams on Admission: No results found.  Impression/Recommendations Acute blood loss anemia, iron deficiency: Acute on chronic.  Patient presents with hemoglobin 6.5 which appears relatively stable when compared to lab work from yesterday where hemoglobin was 6.7.  Patient denies any reports of vaginal bleeding over the last 2 days.  Initially ordered to be transfused 2 units of PRBCs.  She reports taking iron 3 times daily, but last iron level was noted to be 9 on 12/21.  -Would recommend transfusing 1 unit of PRBCs and transfuse 550 mg of Feraheme  Menorrhagia: Patient has follow-up appointment with her OB/GYN Dr. Kennon Rounds tomorrow at 10 AM.  He had been on 80 mg of Megace 3 times daily since 12/12. -Continue Megace -Recommend patient to keep follow-up appointment  Rectal bleeding secondary to internal hemorrhoids: Acute on chronic. -Continue recommending Anusol suppositories as needed  Thrombocytosis: Acute on chronic.  Platelet count elevated at 656 which appears likely reactive in nature.   Thank you for this consultation.  Our [facility] hospitalist team will follow the patient with you.   Time Spent: 30 minutes  Norval Morton [name] [facility] 05/24/2019, 11:19 AM

## 2019-05-24 NOTE — ED Notes (Signed)
Discharge instructions discussed with Pt. Pt verbalized understanding. Pt stable and ambulatory.    

## 2019-05-24 NOTE — ED Notes (Signed)
Leah Baker had heavy vaginal bleeding for awhile -- has been treated by primary care dr-- had labs drawn yesterday, was called to come in for low hgb. On labs yesterday-- hgb 6.4

## 2019-05-24 NOTE — Discharge Instructions (Addendum)
Follow-up with your gynecologist tomorrow as planned.  Return to ER for any worsening or concerning symptoms.

## 2019-05-25 ENCOUNTER — Ambulatory Visit (INDEPENDENT_AMBULATORY_CARE_PROVIDER_SITE_OTHER): Payer: Medicaid Other | Admitting: Family Medicine

## 2019-05-25 ENCOUNTER — Encounter: Payer: Self-pay | Admitting: Family Medicine

## 2019-05-25 DIAGNOSIS — N920 Excessive and frequent menstruation with regular cycle: Secondary | ICD-10-CM

## 2019-05-25 DIAGNOSIS — D62 Acute posthemorrhagic anemia: Secondary | ICD-10-CM | POA: Diagnosis not present

## 2019-05-25 NOTE — Assessment & Plan Note (Signed)
Continue Megace, may decrease to 80 mg bid if stable and no further bleeding. Will schedule for HTA with hysteroscopy and D & C. Risks include but are not limited to bleeding, infection, injury to surrounding structures, including bowel, bladder and ureters, blood clots, and death.  Likelihood of success is high.

## 2019-05-25 NOTE — Assessment & Plan Note (Signed)
S/p blood and feraheme--no further vaginal bleeding. Hopefully if we get her to stop bleeding.

## 2019-05-25 NOTE — Progress Notes (Signed)
   Subjective:    Patient ID: Leah Baker is a 38 y.o. female presenting with Follow-up (DUB)  on 05/25/2019  HPI: Bleeding x several months. On Megace and recent dosage change up to 80 mg tid and stopped bleeding 2 days ago. Has had EMB and pap recently which were normal. She had to have IUD surgically removed and not interested in this. Does not want hyst. Would like endometrial ablation. Having severe night sweats on current dose of Megace. She is going to have a surgical removal of her enlarged lymph node with [facility] and would like to coordinate this if possible.She had a Hgb of 6 yesterday and got 1 unit blood and feraheme in the ED. Feels some better.  Review of Systems  Constitutional: Positive for fatigue. Negative for chills and fever.  Respiratory: Negative for shortness of breath.   Cardiovascular: Negative for chest pain.  Gastrointestinal: Negative for abdominal pain, nausea and vomiting.  Genitourinary: Positive for vaginal bleeding. Negative for dysuria.  Skin: Negative for rash.      Objective:    BP 123/83   Pulse 80   Ht 5\' 2"  (1.575 m)   Wt 162 lb (73.5 kg)   LMP 04/30/2019 (Exact Date)   BMI 29.63 kg/m  Physical Exam Constitutional:      General: She is not in acute distress.    Appearance: She is well-developed.  HENT:     Head: Normocephalic and atraumatic.  Eyes:     General: No scleral icterus. Cardiovascular:     Rate and Rhythm: Normal rate.  Pulmonary:     Effort: Pulmonary effort is normal.  Abdominal:     Palpations: Abdomen is soft.  Musculoskeletal:     Cervical back: Neck supple.  Skin:    General: Skin is warm and dry.  Neurological:     Mental Status: She is alert and oriented to person, place, and time.         Assessment & Plan:   Problem List Items Addressed This Visit      Unprioritized   Menorrhagia    Continue Megace, may decrease to 80 mg bid if stable and no further bleeding. Will schedule for HTA with hysteroscopy  and D & C. Risks include but are not limited to bleeding, infection, injury to surrounding structures, including bowel, bladder and ureters, blood clots, and death.  Likelihood of success is high.        Acute blood loss anemia    S/p blood and feraheme--no further vaginal bleeding. Hopefully if we get her to stop bleeding.         Total face-to-face time with patient: 25 minutes. Over 50% of encounter was spent on counseling and coordination of care. Return in about 3 months (around 08/23/2019) for virtual, postop check.  [name] 05/25/2019 11:43 AM

## 2019-05-25 NOTE — Patient Instructions (Signed)
Endometrial Ablation Endometrial ablation is a procedure that destroys the thin inner layer of the lining of the uterus (endometrium). This procedure may be done:  To stop heavy periods.  To stop bleeding that is causing anemia.  To control irregular bleeding.  To treat bleeding caused by small tumors (fibroids) in the endometrium. This procedure is often an alternative to major surgery, such as removal of the uterus and cervix (hysterectomy). As a result of this procedure:  You may not be able to have children. However, if you are premenopausal (you have not gone through menopause): ? You may still have a small chance of getting pregnant. ? You will need to use a reliable method of birth control after the procedure to prevent pregnancy.  You may stop having a menstrual period, or you may have only a small amount of bleeding during your period. Menstruation may return several years after the procedure. Tell a health care provider about:  Any allergies you have.  All medicines you are taking, including vitamins, herbs, eye drops, creams, and over-the-counter medicines.  Any problems you or family members have had with the use of anesthetic medicines.  Any blood disorders you have.  Any surgeries you have had.  Any medical conditions you have. What are the risks? Generally, this is a safe procedure. However, problems may occur, including:  A hole (perforation) in the uterus or bowel.  Infection of the uterus, bladder, or vagina.  Bleeding.  Damage to other structures or organs.  An air bubble in the lung (air embolus).  Problems with pregnancy after the procedure.  Failure of the procedure.  Decreased ability to diagnose cancer in the endometrium. What happens before the procedure?  You will have tests of your endometrium to make sure there are no pre-cancerous cells or cancer cells present.  You may have an ultrasound of the uterus.  You may be given medicines to  thin the endometrium.  Ask your health care provider about: ? Changing or stopping your regular medicines. This is especially important if you take diabetes medicines or blood thinners. ? Taking medicines such as aspirin and ibuprofen. These medicines can thin your blood. Do not take these medicines before your procedure if your doctor tells you not to.  Plan to have someone take you home from the hospital or clinic. What happens during the procedure?   You will lie on an exam table with your feet and legs supported as in a pelvic exam.  To lower your risk of infection: ? Your health care team will wash or sanitize their hands and put on germ-free (sterile) gloves. ? Your genital area will be washed with soap.  An IV tube will be inserted into one of your veins.  You will be given a medicine to help you relax (sedative).  A surgical instrument with a light and camera (resectoscope) will be inserted into your vagina and moved into your uterus. This allows your surgeon to see inside your uterus.  Endometrial tissue will be removed using one of the following methods: ? Radiofrequency. This method uses a radiofrequency-alternating electric current to remove the endometrium. ? Cryotherapy. This method uses extreme cold to freeze the endometrium. ? Heated-free liquid. This method uses a heated saltwater (saline) solution to remove the endometrium. ? Microwave. This method uses high-energy microwaves to heat up the endometrium and remove it. ? Thermal balloon. This method involves inserting a catheter with a balloon tip into the uterus. The balloon tip is filled with   heated fluid to remove the endometrium. The procedure may vary among health care providers and hospitals. What happens after the procedure?  Your blood pressure, heart rate, breathing rate, and blood oxygen level will be monitored until the medicines you were given have worn off.  As tissue healing occurs, you may notice  vaginal bleeding for 4-6 weeks after the procedure. You may also experience: ? Cramps. ? Thin, watery vaginal discharge that is light pink or brown in color. ? A need to urinate more frequently than usual. ? Nausea.  Do not drive for 24 hours if you were given a sedative.  Do not have sex or insert anything into your vagina until your health care provider approves. Summary  Endometrial ablation is done to treat the many causes of heavy menstrual bleeding.  The procedure may be done only after medications have been tried to control the bleeding.  Plan to have someone take you home from the hospital or clinic. This information is not intended to replace advice given to you by your health care provider. Make sure you discuss any questions you have with your health care provider. Document Released: 03/28/2004 Document Revised: 11/03/2017 Document Reviewed: 06/05/2016 Elsevier Patient Education  2020 Elsevier Inc.  

## 2019-05-26 LAB — BPAM RBC
Blood Product Expiration Date: 202101192359
Blood Product Expiration Date: 202101202359
Blood Product Expiration Date: 202101212359
ISSUE DATE / TIME: 202012221202
ISSUE DATE / TIME: 202012221528
Unit Type and Rh: 5100
Unit Type and Rh: 5100
Unit Type and Rh: 5100

## 2019-05-26 LAB — TYPE AND SCREEN
ABO/RH(D): O POS
Antibody Screen: NEGATIVE
Unit division: 0
Unit division: 0
Unit division: 0

## 2019-05-30 ENCOUNTER — Telehealth: Payer: Self-pay | Admitting: Family Medicine

## 2019-05-30 NOTE — Telephone Encounter (Signed)
Patient had a double transfusion for iron. Patient is in pain and would like to know what she needs to do as far as receiving help.   Please contact patient as soon as possible.

## 2019-06-01 ENCOUNTER — Other Ambulatory Visit: Payer: Self-pay

## 2019-06-01 ENCOUNTER — Ambulatory Visit
Admission: RE | Admit: 2019-06-01 | Discharge: 2019-06-01 | Disposition: A | Discharge status: Home / Self Care | Payer: Medicaid Other | Source: Ambulatory Visit | Attending: Gastroenterology | Admitting: Gastroenterology

## 2019-06-01 DIAGNOSIS — R935 Abnormal findings on diagnostic imaging of other abdominal regions, including retroperitoneum: Secondary | ICD-10-CM

## 2019-06-01 DIAGNOSIS — K7689 Other specified diseases of liver: Secondary | ICD-10-CM | POA: Diagnosis not present

## 2019-06-01 MED ORDER — GADOBENATE DIMEGLUMINE 529 MG/ML IV SOLN
14.0000 mL | Freq: Once | INTRAVENOUS | Status: AC | PRN
  Administered 2019-06-01: 14 mL via INTRAVENOUS

## 2019-06-01 MED ORDER — TRAMADOL HCL 50 MG PO TABS: 50.0000 mg | ORAL_TABLET | Freq: Three times a day (TID) | ORAL | 0 refills | Status: AC | PRN

## 2019-06-01 NOTE — Telephone Encounter (Signed)
Refill tramadol for severe pelvic pain . F/u mri being done today

## 2019-06-01 NOTE — Telephone Encounter (Signed)
Please advise patient on pain concern from transfusion

## 2019-06-06 ENCOUNTER — Ambulatory Visit: Payer: Medicaid Other | Admitting: Obstetrics & Gynecology

## 2019-06-09 ENCOUNTER — Inpatient Hospital Stay: Payer: Medicaid Other | Admitting: Hematology

## 2019-06-10 ENCOUNTER — Telehealth: Payer: Self-pay | Admitting: Hematology

## 2019-06-10 NOTE — Telephone Encounter (Signed)
Scheduled per 1/7 sch msg. Called and left msg. Mailing printout

## 2019-06-30 ENCOUNTER — Telehealth: Payer: Self-pay | Admitting: *Deleted

## 2019-06-30 ENCOUNTER — Other Ambulatory Visit: Payer: Self-pay | Admitting: Hematology

## 2019-06-30 DIAGNOSIS — R590 Localized enlarged lymph nodes: Secondary | ICD-10-CM

## 2019-06-30 DIAGNOSIS — R599 Enlarged lymph nodes, unspecified: Secondary | ICD-10-CM

## 2019-06-30 NOTE — Telephone Encounter (Signed)
Patient called. Has not had biopsy yet that was ordered by Dr. Irene Limbo (03/01/2019). . Had MRI on 12/30. Is scheduled for appt with Dr. Irene Limbo 07/01/2019, but without biopsy results, pt asked if she should wait to come once biopsy completed? Note: contacted [facility] to f/u on scheduling biopsy. Per chart, referral sent via proficient on 9/28. No referral for biopsy received from this office. Faxed referral to [phone] attn Sarah. Dr.Kale then stated that he wants to hold off on referral for biopsy - notified [facility] - they will inform Judson Roch that referral does not need action at this time. Per Dr. Irene Limbo - orders placed for labs/ CT Ab/Pelvis in 7 weeks with appt w/MD to follow for discussion of results.  Contacted patient to inform of Dr. Irene Limbo orders. She verbalized understanding.

## 2019-07-01 ENCOUNTER — Inpatient Hospital Stay: Payer: Medicaid Other | Admitting: Hematology

## 2019-07-23 NOTE — H&P (Signed)
Leah Baker is an 39 y.o. [phone] female.   Chief Complaint: Abnormal bleeding, anemia HPI: H/o BTL, with on-going bleeding on Megace to control. Recent blood transfusion. Previous BTL and C-section x 2.  Past Medical History:  Diagnosis Date  . Acute costochondritis 11/08/2018   anterior chest wall pain  per pt pcp note in epic on 11-10-2018  . Costochondritis, acute 11/10/2018  . Depression   . Hemorrhoids   . History of lower GI bleeding 08/31/2018   s/p  colonoscopy 09-01-2018,  hemorrhoids  . IDA (iron deficiency anemia)   . Inguinal lymphadenopathy    CT 09-14-2018  right side, followed by pcp  . PONV (postoperative nausea and vomiting)    only happened once  . Wears glasses     Past Surgical History:  Procedure Laterality Date  . BIOPSY  09/01/2018   Procedure: BIOPSY;  Surgeon: Otis Brace, MD;  Location: [facility];  Service: Gastroenterology;;  . CESAREAN SECTION  01/15/2002   @[facility]   . CESAREAN SECTION  03/17/2011   Procedure: CESAREAN SECTION;  Surgeon: Osborne Oman, MD;  Location: [facility];  Service: Gynecology;  Laterality: N/A;  . COLONOSCOPY WITH PROPOFOL N/A 09/01/2018   Procedure: COLONOSCOPY WITH PROPOFOL;  Surgeon: Otis Brace, MD;  Location: [facility];  Service: Gastroenterology;  Laterality: N/A;  . HYSTEROSCOPY WITH D & C  06-21-2009  dr constant @[facility]    w/  REMOVAL IUD   . IUD REMOVAL  06/21/2009  . KNEE ARTHROSCOPY WITH ANTERIOR CRUCIATE LIGAMENT (ACL) REPAIR Left 09-08-2013   dr [name]  @[facility]    w/  meniscal repair  . MULTIPLE TOOTH EXTRACTIONS    . RECTAL EXAM UNDER ANESTHESIA N/A 11/18/2018   Procedure: ANORECTAL EXAM UNDER ANESTHESIA;  Surgeon: Ileana Roup, MD;  Location: [facility];  Service: General;  Laterality: N/A;  . TUBAL LIGATION Bilateral 2012    Family History  Problem Relation Age of Onset  . Cancer Mother   . Kidney failure Mother   . Healthy Father    Social History:  reports that she quit  smoking about 10 years ago. Her smoking use included cigarettes. She quit after 5.00 years of use. She has never used smokeless tobacco. She reports current alcohol use. She reports previous drug use.  Allergies: No Known Allergies  No medications prior to admission.    A comprehensive review of systems was negative.  There were no vitals taken for this visit. General appearance: alert, cooperative and appears stated age Head: Normocephalic, without obvious abnormality, atraumatic Neck: supple, symmetrical, trachea midline Lungs: normal effort Heart: regular rate and rhythm Abdomen: soft, non-tender; bowel sounds normal; no masses,  no organomegaly Extremities: extremities normal, atraumatic, no cyanosis or edema Skin: Skin color, texture, turgor normal. No rashes or lesions Neurologic: Grossly normal   Lab Results  Component Value Date   WBC 6.7 05/24/2019   HGB 6.5 (LL) 05/24/2019   HCT 21.3 (L) 05/24/2019   MCV 88.4 05/24/2019   PLT 656 (H) 05/24/2019   Lab Results  Component Value Date   PREGTESTUR NEGATIVE 11/26/2018   HCG <5.0 05/14/2019     Assessment/Plan Active Problems:   Menorrhagia   Acute blood loss anemia  For D & C, hysteroscopy with hydrothermal endometrial ablation. Risks include but are not limited to bleeding, infection, injury to surrounding structures, including bowel, bladder and ureters, blood clots, and death.  Likelihood of success is high.    [name] 07/23/2019, 10:41 AM

## 2019-07-29 NOTE — Progress Notes (Signed)
Pt called via phone and stated that she will need to reschedule because her father is having emergency surgery 07-29-2019.  Advised pt to call Dr Kennon Rounds office to reschedule, pt verbalized understanding.  Also, I left message for [place], [place] scheduler for Dr Kennon Rounds.

## 2019-08-02 ENCOUNTER — Encounter (HOSPITAL_BASED_OUTPATIENT_CLINIC_OR_DEPARTMENT_OTHER): Admission: RE | Payer: Self-pay | Source: Home / Self Care

## 2019-08-02 ENCOUNTER — Ambulatory Visit (HOSPITAL_BASED_OUTPATIENT_CLINIC_OR_DEPARTMENT_OTHER): Admission: RE | Admit: 2019-08-02 | Payer: Medicaid Other | Source: Home / Self Care | Admitting: Family Medicine

## 2019-08-02 SURGERY — DILATATION & CURETTAGE/HYSTEROSCOPY WITH HYDROTHERMAL ABLATION
Anesthesia: Choice

## 2019-08-08 ENCOUNTER — Encounter: Payer: Self-pay | Admitting: Family Medicine

## 2019-08-08 ENCOUNTER — Ambulatory Visit: Payer: Medicaid Other | Attending: Family Medicine | Admitting: Family Medicine

## 2019-08-08 ENCOUNTER — Other Ambulatory Visit: Payer: Self-pay

## 2019-08-08 DIAGNOSIS — F329 Major depressive disorder, single episode, unspecified: Secondary | ICD-10-CM

## 2019-08-08 DIAGNOSIS — F430 Acute stress reaction: Secondary | ICD-10-CM

## 2019-08-08 DIAGNOSIS — F411 Generalized anxiety disorder: Secondary | ICD-10-CM

## 2019-08-08 DIAGNOSIS — J00 Acute nasopharyngitis [common cold]: Secondary | ICD-10-CM

## 2019-08-08 DIAGNOSIS — H6693 Otitis media, unspecified, bilateral: Secondary | ICD-10-CM | POA: Diagnosis not present

## 2019-08-08 MED ORDER — AMOXICILLIN-POT CLAVULANATE 500-125 MG PO TABS: 1.0000 | ORAL_TABLET | Freq: Two times a day (BID) | ORAL | 0 refills | Status: DC

## 2019-08-08 MED ORDER — SERTRALINE HCL 100 MG PO TABS: 200.0000 mg | ORAL_TABLET | Freq: Every day | ORAL | 3 refills | Status: DC

## 2019-08-08 NOTE — Progress Notes (Signed)
[name] and Name verified  C /o BL ear pain and post nasal drip X 4 days/ Fever of 100 and 101F . Last fever was recorded yesterday 99.73F   Pt is being worried of her father health condition / Made MD aware

## 2019-08-08 NOTE — Progress Notes (Signed)
Virtual Visit via Telephone Note  I connected with Leah Baker on 08/08/19 at 10:50 AM EST by telephone and verified that I am speaking with the correct person using two identifiers.   I discussed the limitations, risks, security and privacy concerns of performing an evaluation and management service by telephone and the availability of in person appointments. I also discussed with the patient that there may be a patient responsible charge related to this service. The patient expressed understanding and agreed to proceed.  Patient Location: Home Provider Location: [facility] Others participating in call: call initiated by Delorse Limber, RN who then transferred the call to me   History of Present Illness:        39 year old female seen secondary to complaint of onset last Tuesday of pain/discomfort in her left ear and she initially thought that perhaps she had gotten water in her ear but since then the ear pain has progressed and she then had onset about 2 nights ago of pain in the right ear as well.  Nail she has bilateral, throbbing ear pain that ranges between a 6-7 on a 0-to-10 scale.  She also had onset of fever on Friday to 100.0 followed by fever of 101 on Saturday.  She is taking Tylenol for the fever and ear pain.  She cannot take ibuprofen due to issues with menorrhagia.  She is currently on Megace to help with her menorrhagia and she states that she believes that since being on an increased dose of the Megace for more than a month, this is caused her to have postnasal drainage.  Postnasal drainage has been clear when she has been able to blow anything out of her nose.  She has had some recent increase in nasal congestion.  She denies any sore throat, no headache or dizziness, no cough or shortness of breath.  She does not believe that she needs to be Covid tested at this time as she did have a negative Covid test approximately 1 week ago.  She denies any current muscle aches, no abdominal  pain or diarrhea.  She has tried over-the-counter medicine such as Mucinex and Zyrtec with only minimal relief of her nasal congestion and postnasal drainage.         She reports continued issues with anxiety and reactive depression as her father remains hospitalized and is still too weak to have defibrillator placement.  She states that every time the following she is worried that it is bad information about her father's health.  She is currently taking Zoloft 100 mg and she believes this has helped decrease her anxiety and has improved her sleep however about midday she starts to feel anxious and shaky again.  She denies any suicidal thoughts or ideations.  No chest pain or palpitations.           Past Medical History:  Diagnosis Date  . Acute costochondritis 11/08/2018   anterior chest wall pain  per pt pcp note in epic on 11-10-2018  . Costochondritis, acute 11/10/2018  . Depression   . Hemorrhoids   . History of lower GI bleeding 08/31/2018   s/p  colonoscopy 09-01-2018,  hemorrhoids  . IDA (iron deficiency anemia)   . Inguinal lymphadenopathy    CT 09-14-2018  right side, followed by pcp  . PONV (postoperative nausea and vomiting)    only happened once  . Wears glasses     Past Surgical History:  Procedure Laterality Date  . BIOPSY  09/01/2018  Procedure: BIOPSY;  Surgeon: Otis Brace, MD;  Location: [facility];  Service: Gastroenterology;;  . CESAREAN SECTION  01/15/2002   @[facility]   . CESAREAN SECTION  03/17/2011   Procedure: CESAREAN SECTION;  Surgeon: Osborne Oman, MD;  Location: [facility];  Service: Gynecology;  Laterality: N/A;  . COLONOSCOPY WITH PROPOFOL N/A 09/01/2018   Procedure: COLONOSCOPY WITH PROPOFOL;  Surgeon: Otis Brace, MD;  Location: [facility];  Service: Gastroenterology;  Laterality: N/A;  . HYSTEROSCOPY WITH D & C  06-21-2009  dr constant @[facility]    w/  REMOVAL IUD   . IUD REMOVAL  06/21/2009  . KNEE ARTHROSCOPY WITH ANTERIOR CRUCIATE LIGAMENT (ACL) REPAIR  Left 09-08-2013   dr [name]  @[facility]    w/  meniscal repair  . MULTIPLE TOOTH EXTRACTIONS    . RECTAL EXAM UNDER ANESTHESIA N/A 11/18/2018   Procedure: ANORECTAL EXAM UNDER ANESTHESIA;  Surgeon: Ileana Roup, MD;  Location: [facility];  Service: General;  Laterality: N/A;  . TUBAL LIGATION Bilateral 2012    Family History  Problem Relation Age of Onset  . Cancer Mother   . Kidney failure Mother   . Healthy Father     Social History   Tobacco Use  . Smoking status: Former Smoker    Years: 5.00    Types: Cigarettes    Quit date: 08/22/2008    Years since quitting: 10.9  . Smokeless tobacco: Never Used  Substance Use Topics  . Alcohol use: Yes    Comment: occasionally  . Drug use: Not Currently    Comment: per pt younger in college marijuana, none since     No Known Allergies     Observations/Objective: No vital signs or physical exam conducted as visit was done via telephone  Assessment and Plan: 1. Bilateral otitis media, unspecified otitis media type Patient with possible bilateral otitis media based on her symptoms of bilateral ear pain and fever.  Prescription will be sent to her pharmacy for Augmentin 500 mg twice daily x7 days and she is aware that she should take the medication after eating and also eat yogurt with active cultures while on the antibiotic to help prevent diarrhea.  If she has any acute worsening of your pain she should go to urgent care for further evaluation, if her ear pain does not significantly improve she should follow-up in 1 week. - amoxicillin-clavulanate (AUGMENTIN) 500-125 MG tablet; Take 1 tablet (500 mg total) by mouth 2 (two) times daily. Take after eating  Dispense: 14 tablet; Refill: 0  2. Acute nasopharyngitis Patient is encouraged to take an over-the-counter antihistamine such as Zyrtec which she has at home on a nightly basis to help with current acute increase in nasal congestion and postnasal drainage.  She should  notify the office if any worsening symptoms or onset of new symptoms such as body aches, loss of sense of taste or smell, continued fever or any other concerns that may warrant testing for COVID-19.  3. Reactive depression 4. Anxiety as acute reaction to exceptional stress She reports continued issues with anxiety and depression as her father continues to be in poor health and remains hospitalized.  We will have patient increase her sertraline to 150 mg initially and she may take 100 mg at bedtime and 50 mg midday as she reports increased anxiety midday.  She may either continue at 150 mg but if she does not feel that this is sufficient, she may take 200 mg daily.  She has been asked  to follow-up in 4 weeks. - sertraline (ZOLOFT) 100 MG tablet; Take 2 tablets (200 mg total) by mouth at bedtime.  Dispense: 60 tablet; Refill: 3  Follow Up Instructions:Return in about 4 weeks (around 09/05/2019) for Anxiety/depression ;ear pain-next week if not better.    I discussed the assessment and treatment plan with the patient. The patient was provided an opportunity to ask questions and all were answered. The patient agreed with the plan and demonstrated an understanding of the instructions.   The patient was advised to call back or seek an in-person evaluation if the symptoms worsen or if the condition fails to improve as anticipated.  I provided 14 minutes of non-face-to-face time during this encounter.   Antony Blackbird, MD

## 2019-08-25 ENCOUNTER — Telehealth: Payer: Self-pay | Admitting: Family Medicine

## 2019-08-25 NOTE — Telephone Encounter (Signed)
Patient called and requested for a letter to her landlord. Patient stated that she is moving in the next few months and that her landlord has been bring visitors in her home to view the property. Patient expressed concerns regarding her current health conditions and all of the people who are coming in and out of your home. Patient requested for a letter from pcp to give the landlord to hopefully prevent them coming in and out of the home. Please follow up with the patient at your earliest convenience for any further questions or concerns.

## 2019-08-29 ENCOUNTER — Encounter: Payer: Self-pay | Admitting: Family Medicine

## 2019-08-29 NOTE — Telephone Encounter (Signed)
Please contact patient for pickup of letter which I provided to you

## 2019-08-29 NOTE — Progress Notes (Signed)
Patient ID: Leah Baker, female   [name]: 1981/03/13, 39 y.o.   MRN: [id number]   See message from patient, note written on patient's behalf as she reports that her landlord is currently bringing people and to see her apartment prior to her anticipated move date and this action is because patient to be concerned that she could contract COVID-19 or other illness.

## 2019-08-30 NOTE — Telephone Encounter (Signed)
Contacted pt and made aware that letter is ready for pickup . Pt states she will have someone come pick up letter

## 2019-09-12 ENCOUNTER — Ambulatory Visit: Payer: Medicaid Other | Attending: Family Medicine | Admitting: Family Medicine

## 2019-09-12 ENCOUNTER — Other Ambulatory Visit: Payer: Self-pay

## 2019-09-12 DIAGNOSIS — J310 Chronic rhinitis: Secondary | ICD-10-CM | POA: Diagnosis not present

## 2019-09-12 DIAGNOSIS — H6693 Otitis media, unspecified, bilateral: Secondary | ICD-10-CM

## 2019-09-12 DIAGNOSIS — J329 Chronic sinusitis, unspecified: Secondary | ICD-10-CM

## 2019-09-12 MED ORDER — AMOXICILLIN-POT CLAVULANATE 875-125 MG PO TABS: 1.0000 | ORAL_TABLET | Freq: Two times a day (BID) | ORAL | 0 refills | Status: DC

## 2019-09-12 MED ORDER — FLUTICASONE PROPIONATE 50 MCG/ACT NA SUSP: 2.0000 | Freq: Every day | NASAL | 1 refills | Status: DC

## 2019-09-12 MED ORDER — CETIRIZINE HCL 10 MG PO TABS: 10.0000 mg | ORAL_TABLET | Freq: Every day | ORAL | 1 refills | Status: DC

## 2019-09-12 NOTE — Progress Notes (Signed)
States that she is taking the megestrol and she states that it causes post nasal drip.  She states that she has taken antibiotics and symptoms has not gotten any better.  She is having nasal congestion, fever, no coughing.

## 2019-09-12 NOTE — Progress Notes (Signed)
Virtual Visit via Telephone Note  I connected with [name], on 09/12/2019 at 10:09 AM by telephone due to the COVID-19 pandemic and verified that I am speaking with the correct person using two identifiers.   Consent: I discussed the limitations, risks, security and privacy concerns of performing an evaluation and management service by telephone and the availability of in person appointments. I also discussed with the patient that there may be a patient responsible charge related to this service. The patient expressed understanding and agreed to proceed.   Location of Patient: Home  Location of Provider: Clinic   Persons participating in Telemedicine visit: Reigan Moala Farrington-CMA Dr. Margarita Rana     History of Present Illness: Leah Baker presents today for an acute visit.  Complains of night time post nasal drip and coughing which have worsened over the last few months and she was told it was Megestrol by her Gyn. Held off on Megestrol with resulting improvement in post nasal drips. Cough occurs when she lies on her back. Completed a course of Augmentin 500/125 1 tab bid x7 days prescribed by her PCP on 08/18/19 with improvement for 2 weeks but then symptoms returned. Has pain in her cheek bones, has had a fever of 101.; she has otalgia as well. Tried Flonase, Mucinex with no improvement. Takes Zyrtec daily.  Past Medical History:  Diagnosis Date  . Acute costochondritis 11/08/2018   anterior chest wall pain  per pt pcp note in epic on 11-10-2018  . Costochondritis, acute 11/10/2018  . Depression   . Hemorrhoids   . History of lower GI bleeding 08/31/2018   s/p  colonoscopy 09-01-2018,  hemorrhoids  . IDA (iron deficiency anemia)   . Inguinal lymphadenopathy    CT 09-14-2018  right side, followed by pcp  . PONV (postoperative nausea and vomiting)    only happened once  . Wears glasses    No Known Allergies  Current Outpatient Medications on File Prior  to Visit  Medication Sig Dispense Refill  . acetaminophen (TYLENOL) 325 MG tablet Take 2 tablets (650 mg total) by mouth every 6 (six) hours as needed for mild pain (or Fever >/= 101).    . ferrous sulfate 325 (65 FE) MG EC tablet Take 1 tablet (325 mg total) by mouth 2 (two) times daily with a meal. (Patient taking differently: Take 325 mg by mouth 3 (three) times daily with meals. ) 90 tablet 3  . megestrol (MEGACE) 40 MG tablet Take 2 tablets (80 mg total) by mouth 2 (two) times daily. (Patient taking differently: Take 80 mg by mouth 3 (three) times daily. ) 90 tablet 3  . Multiple Vitamins-Minerals (WOMENS MULTI VITAMIN & MINERAL PO) Take 1 tablet by mouth daily.    . sertraline (ZOLOFT) 100 MG tablet Take 2 tablets (200 mg total) by mouth at bedtime. 60 tablet 3  . amoxicillin-clavulanate (AUGMENTIN) 500-125 MG tablet Take 1 tablet (500 mg total) by mouth 2 (two) times daily. Take after eating (Patient not taking: Reported on 09/12/2019) 14 tablet 0  . ferumoxytol (FERAHEME) 510 MG/17ML SOLN injection Inject 17 mLs (510 mg total) into the vein once for 1 dose. 17 mL 0  . psyllium (HYDROCIL/METAMUCIL) 95 % PACK Take 1 packet by mouth daily.     No current facility-administered medications on file prior to visit.    Observations/Objective: Awake, alert, oriented x3 Not in acute distress  Assessment and Plan: 1. Rhinosinusitis Suboptimal dose of Augmentin prescribed previously Will prescribe  Augmentin 875/125 mg Advised to commence Flonase, Zyrtec Also to commence nasal irrigation - amoxicillin-clavulanate (AUGMENTIN) 875-125 MG tablet; Take 1 tablet by mouth 2 (two) times daily.  Dispense: 20 tablet; Refill: 0 - fluticasone (FLONASE) 50 MCG/ACT nasal spray; Place 2 sprays into both nostrils daily.  Dispense: 16 g; Refill: 1 - cetirizine (ZYRTEC) 10 MG tablet; Take 1 tablet (10 mg total) by mouth daily.  Dispense: 30 tablet; Refill: 1  2. Bilateral otitis media, unspecified otitis media  type See #1 above - amoxicillin-clavulanate (AUGMENTIN) 875-125 MG tablet; Take 1 tablet by mouth 2 (two) times daily.  Dispense: 20 tablet; Refill: 0   Follow Up Instructions: Follow-up of chronic medical conditions with PCP.   I discussed the assessment and treatment plan with the patient. The patient was provided an opportunity to ask questions and all were answered. The patient agreed with the plan and demonstrated an understanding of the instructions.   The patient was advised to call back or seek an in-person evaluation if the symptoms worsen or if the condition fails to improve as anticipated.     I provided 14 minutes total of non-face-to-face time during this encounter including median intraservice time, reviewing previous notes, investigations, ordering medications, medical decision making, coordinating care and patient verbalized understanding at the end of the visit.     Charlott Rakes, MD, FAAFP. [facility], [place]   09/12/2019, 10:09 AM

## 2019-09-14 ENCOUNTER — Encounter: Payer: Self-pay | Admitting: Family Medicine

## 2019-09-14 ENCOUNTER — Telehealth (INDEPENDENT_AMBULATORY_CARE_PROVIDER_SITE_OTHER): Payer: Medicaid Other | Admitting: Family Medicine

## 2019-09-14 DIAGNOSIS — N920 Excessive and frequent menstruation with regular cycle: Secondary | ICD-10-CM | POA: Diagnosis not present

## 2019-09-14 DIAGNOSIS — J329 Chronic sinusitis, unspecified: Secondary | ICD-10-CM

## 2019-09-14 DIAGNOSIS — H6693 Otitis media, unspecified, bilateral: Secondary | ICD-10-CM

## 2019-09-14 DIAGNOSIS — K769 Liver disease, unspecified: Secondary | ICD-10-CM | POA: Diagnosis not present

## 2019-09-14 MED ORDER — MEGESTROL ACETATE 40 MG PO TABS: 80.0000 mg | ORAL_TABLET | Freq: Two times a day (BID) | ORAL | 2 refills | Status: DC

## 2019-09-14 NOTE — Progress Notes (Signed)
GYNECOLOGY VIRTUAL VISIT ENCOUNTER NOTE  Provider location: [facility]   I connected with [name] on 09/14/19 at 10:55 AM EDT by MyChart Video Encounter at home and verified that I am speaking with the correct person using two identifiers.   I discussed the limitations, risks, security and privacy concerns of performing an evaluation and management service virtually and the availability of in person appointments. I also discussed with the patient that there may be a patient responsible charge related to this service. The patient expressed understanding and agreed to proceed.   History:  Leah Baker is a 39 y.o. [phone] female being evaluated today for follow-up after canceling her last surgery due to her father getting sick.. She denies any abnormal vaginal discharge, bleeding, pelvic pain or other concerns.       Past Medical History:  Diagnosis Date  . Acute costochondritis 11/08/2018   anterior chest wall pain  per pt pcp note in epic on 11-10-2018  . Costochondritis, acute 11/10/2018  . Depression   . Hemorrhoids   . History of lower GI bleeding 08/31/2018   s/p  colonoscopy 09-01-2018,  hemorrhoids  . IDA (iron deficiency anemia)   . Inguinal lymphadenopathy    CT 09-14-2018  right side, followed by pcp  . PONV (postoperative nausea and vomiting)    only happened once  . Wears glasses    Past Surgical History:  Procedure Laterality Date  . BIOPSY  09/01/2018   Procedure: BIOPSY;  Surgeon: Otis Brace, MD;  Location: [facility];  Service: Gastroenterology;;  . CESAREAN SECTION  01/15/2002   @[facility]   . CESAREAN SECTION  03/17/2011   Procedure: CESAREAN SECTION;  Surgeon: Osborne Oman, MD;  Location: [facility];  Service: Gynecology;  Laterality: N/A;  . COLONOSCOPY WITH PROPOFOL N/A 09/01/2018   Procedure: COLONOSCOPY WITH PROPOFOL;  Surgeon: Otis Brace, MD;  Location: [facility];  Service: Gastroenterology;  Laterality:  N/A;  . HYSTEROSCOPY WITH D & C  06-21-2009  dr constant @[facility]    w/  REMOVAL IUD   . IUD REMOVAL  06/21/2009  . KNEE ARTHROSCOPY WITH ANTERIOR CRUCIATE LIGAMENT (ACL) REPAIR Left 09-08-2013   dr [name]  @[facility]    w/  meniscal repair  . MULTIPLE TOOTH EXTRACTIONS    . RECTAL EXAM UNDER ANESTHESIA N/A 11/18/2018   Procedure: ANORECTAL EXAM UNDER ANESTHESIA;  Surgeon: Ileana Roup, MD;  Location: [facility];  Service: General;  Laterality: N/A;  . TUBAL LIGATION Bilateral 2012   The following portions of the patient's history were reviewed and updated as appropriate: allergies, current medications, past family history, past medical history, past social history, past surgical history and problem list.   Health Maintenance:  Normal pap and negative HRHPV on 12/07/2018.  Normal mammogram on 03/04/2019.   Review of Systems:  Pertinent items noted in HPI and remainder of comprehensive ROS otherwise negative.  Physical Exam:   General:  Alert, oriented and cooperative. Patient appears to be in no acute distress.  Mental Status: Normal mood and affect. Normal behavior. Normal judgment and thought content.   Respiratory: Normal respiratory effort, no problems with respiration noted  Rest of physical exam deferred due to type of encounter    Assessment and Plan:     1. Bilateral otitis media, unspecified otitis media type On Augmentin - ? Related to her Megace  3. Menorrhagia with regular cycle Continue Megace--rebook HTA - megestrol (MEGACE) 40 MG tablet; Take  2 tablets (80 mg total) by mouth 2 (two) times daily.  Dispense: 120 tablet; Refill: 2  4. Liver lesion ? Related to her HTA       I discussed the assessment and treatment plan with the patient. The patient was provided an opportunity to ask questions and all were answered. The patient agreed with the plan and demonstrated an understanding of the instructions.   The patient was advised to call back or seek an  in-person evaluation/go to the ED if the symptoms worsen or if the condition fails to improve as anticipated.  I provided 12 minutes of face-to-face time during this encounter.   [name], MD [facility], [place]

## 2019-09-14 NOTE — Progress Notes (Signed)
I connected with  Leah Baker on 09/14/19 at 1058 by telephone and verified that I am speaking with the correct person using two identifiers.   I discussed the limitations, risks, security and privacy concerns of performing an evaluation and management service by telephone and the availability of in person appointments. I also discussed with the patient that there may be a patient responsible charge related to this service. The patient expressed understanding and agreed to proceed.  Annabell Howells, RN 09/14/2019  10:58 AM

## 2019-10-05 ENCOUNTER — Ambulatory Visit: Payer: Medicaid Other | Attending: Family Medicine | Admitting: Family Medicine

## 2019-10-05 ENCOUNTER — Encounter: Payer: Self-pay | Admitting: Family Medicine

## 2019-10-05 ENCOUNTER — Other Ambulatory Visit: Payer: Self-pay

## 2019-10-05 DIAGNOSIS — J310 Chronic rhinitis: Secondary | ICD-10-CM

## 2019-10-05 DIAGNOSIS — J329 Chronic sinusitis, unspecified: Secondary | ICD-10-CM

## 2019-10-05 DIAGNOSIS — H9203 Otalgia, bilateral: Secondary | ICD-10-CM

## 2019-10-05 MED ORDER — CEFDINIR 300 MG PO CAPS: 300.0000 mg | ORAL_CAPSULE | Freq: Two times a day (BID) | ORAL | 0 refills | Status: AC

## 2019-10-05 MED ORDER — FLUTICASONE PROPIONATE 50 MCG/ACT NA SUSP: 2.0000 | Freq: Every day | NASAL | 1 refills | Status: DC

## 2019-10-05 NOTE — Progress Notes (Signed)
Virtual Visit via Telephone Note  I connected with Leah Baker on 10/05/19 at  9:10 AM EDT by telephone and verified that I am speaking with the correct person using two identifiers.   I discussed the limitations, risks, security and privacy concerns of performing an evaluation and management service by telephone and the availability of in person appointments. I also discussed with the patient that there may be a patient responsible charge related to this service. The patient expressed understanding and agreed to proceed.  Patient Location: Home Provider Location: [facility] Others participating in call: none   History of Present Illness:        39 yo female with complaint of recurrent ear infections which she believes is related to her use of Megace. She has complaint of 3 days of fever with highest temp of 101 and temp last of 100 ( around 2 am this morning). Mild cough when lying down. Taking otc Tylenol for bodyaches, ear pain and fever.  Postnasal drainage and light yellow nasal drainage. Throat irritation and hoarseness and throat irritation from drainage. Frontal and temporal headaches. Will be able to get off of Megace in June after ablation procedure due to heavy menses. Continued fatigue. Ear pain constant x 3 days. Sharp pain in both ears and sensation of muffled hearing at times. Had COVID test 2 weeks ago which was negative. Mild cough at night when she lies down. Abdominal pain and cramping from current menses.   Past Medical History:  Diagnosis Date  . Acute costochondritis 11/08/2018   anterior chest wall pain  per pt pcp note in epic on 11-10-2018  . Costochondritis, acute 11/10/2018  . Depression   . Hemorrhoids   . History of lower GI bleeding 08/31/2018   s/p  colonoscopy 09-01-2018,  hemorrhoids  . IDA (iron deficiency anemia)   . Inguinal lymphadenopathy    CT 09-14-2018  right side, followed by pcp  . PONV (postoperative nausea and vomiting)    only happened  once  . Wears glasses     Past Surgical History:  Procedure Laterality Date  . BIOPSY  09/01/2018   Procedure: BIOPSY;  Surgeon: Otis Brace, MD;  Location: [facility];  Service: Gastroenterology;;  . CESAREAN SECTION  01/15/2002   @[facility]   . CESAREAN SECTION  03/17/2011   Procedure: CESAREAN SECTION;  Surgeon: Osborne Oman, MD;  Location: [facility];  Service: Gynecology;  Laterality: N/A;  . COLONOSCOPY WITH PROPOFOL N/A 09/01/2018   Procedure: COLONOSCOPY WITH PROPOFOL;  Surgeon: Otis Brace, MD;  Location: [facility];  Service: Gastroenterology;  Laterality: N/A;  . HYSTEROSCOPY WITH D & C  06-21-2009  dr constant @[facility]    w/  REMOVAL IUD   . IUD REMOVAL  06/21/2009  . KNEE ARTHROSCOPY WITH ANTERIOR CRUCIATE LIGAMENT (ACL) REPAIR Left 09-08-2013   dr [name]  @[facility]    w/  meniscal repair  . MULTIPLE TOOTH EXTRACTIONS    . RECTAL EXAM UNDER ANESTHESIA N/A 11/18/2018   Procedure: ANORECTAL EXAM UNDER ANESTHESIA;  Surgeon: Ileana Roup, MD;  Location: [facility];  Service: General;  Laterality: N/A;  . TUBAL LIGATION Bilateral 2012    Family History  Problem Relation Age of Onset  . Cancer Mother   . Kidney failure Mother   . Healthy Father     Social History   Tobacco Use  . Smoking status: Former Smoker    Years: 5.00    Types: Cigarettes    Quit date: 08/22/2008  Years since quitting: 11.1  . Smokeless tobacco: Never Used  Substance Use Topics  . Alcohol use: Yes    Comment: occasionally  . Drug use: Not Currently    Comment: per pt younger in college marijuana, none since     No Known Allergies     Observations/Objective: No vital signs or physical exam conducted as visit was done via telephone  Assessment and Plan: 1. Ear pain, bilateral She reports issues with bilateral ear pain which she believes may be related to her use of Megace.  She will be referred to ENT for further evaluation and treatment of recurrent ear pain and nasal  congestion.  Prescription Omnicef 300 mg twice daily x7 days in case of infection.  Continue use of Zyrtec and Flonase to help with nasal congestion as ear pain can also be caused by eustachian tube dysfunction. - Ambulatory referral to ENT - cefdinir (OMNICEF) 300 MG capsule; Take 1 capsule (300 mg total) by mouth 2 (two) times daily for 7 days.  Dispense: 14 capsule; Refill: 0  2. Rhinosinusitis Continue zyrtec at night, flonase and consider Mucinex. ENT referral placed. Omicef in case of ear/sinus infection.  - fluticasone (FLONASE) 50 MCG/ACT nasal spray; Place 2 sprays into both nostrils daily.  Dispense: 16 g; Refill: 1 - cefdinir (OMNICEF) 300 MG capsule; Take 1 capsule (300 mg total) by mouth 2 (two) times daily for 7 days.  Dispense: 14 capsule; Refill: 0  Follow Up Instructions:Return in about 1 week (around 10/12/2019) for ear pain/fever.    I discussed the assessment and treatment plan with the patient. The patient was provided an opportunity to ask questions and all were answered. The patient agreed with the plan and demonstrated an understanding of the instructions.   The patient was advised to call back or seek an in-person evaluation if the symptoms worsen or if the condition fails to improve as anticipated.  I provided 12 minutes of non-face-to-face time during this encounter.   Antony Blackbird, MD

## 2019-10-05 NOTE — Progress Notes (Signed)
Bodyaches, fever at 100 this morning and ear ache and today is her 3rd day

## 2019-10-25 ENCOUNTER — Encounter (HOSPITAL_BASED_OUTPATIENT_CLINIC_OR_DEPARTMENT_OTHER): Payer: Self-pay | Admitting: Family Medicine

## 2019-10-29 ENCOUNTER — Other Ambulatory Visit (HOSPITAL_COMMUNITY): Payer: Medicaid Other

## 2019-11-02 ENCOUNTER — Encounter (HOSPITAL_BASED_OUTPATIENT_CLINIC_OR_DEPARTMENT_OTHER): Admission: RE | Payer: Self-pay | Source: Home / Self Care

## 2019-11-02 ENCOUNTER — Ambulatory Visit (HOSPITAL_BASED_OUTPATIENT_CLINIC_OR_DEPARTMENT_OTHER): Admission: RE | Admit: 2019-11-02 | Payer: Medicaid Other | Source: Home / Self Care | Admitting: Family Medicine

## 2019-11-02 SURGERY — DILATATION & CURETTAGE/HYSTEROSCOPY WITH HYDROTHERMAL ABLATION
Anesthesia: Choice

## 2019-11-11 ENCOUNTER — Encounter (HOSPITAL_COMMUNITY): Payer: Self-pay

## 2019-11-11 ENCOUNTER — Ambulatory Visit (HOSPITAL_COMMUNITY)
Admission: EM | Admit: 2019-11-11 | Discharge: 2019-11-11 | Disposition: A | Discharge status: Home / Self Care | Payer: Medicaid Other | Attending: Internal Medicine | Admitting: Internal Medicine

## 2019-11-11 DIAGNOSIS — K645 Perianal venous thrombosis: Secondary | ICD-10-CM

## 2019-11-11 MED ORDER — ACETAMINOPHEN 500 MG PO TABS: 500.0000 mg | ORAL_TABLET | Freq: Four times a day (QID) | ORAL | 0 refills | Status: AC | PRN

## 2019-11-11 MED ORDER — HYDROCORTISONE ACETATE 25 MG RE SUPP: 25.0000 mg | Freq: Two times a day (BID) | RECTAL | 0 refills | Status: DC

## 2019-11-11 NOTE — ED Provider Notes (Signed)
[facility]    CSN: [id number] Arrival date & time: 11/11/19  1759      History   Chief Complaint Chief Complaint  Patient presents with   Hemorrhoids    HPI Leah Baker is a 39 y.o. female patient has a history of internal hemorrhoids.  Over the past 5 days she complains of worsening rectal pain.  Patient has worsening pain with bowel movement.  No rectal masses.  Patient admits to having some blood on stool.  She denies constipation.  No abdominal pain or distention.  No nausea or vomiting.  Patient has not tried any over-the-counter agents for the hemorrhoidal pain. Past Medical History:  Diagnosis Date   Acute costochondritis 11/08/2018   anterior chest wall pain  per pt pcp note in epic on 11-10-2018   Costochondritis, acute 11/10/2018   Depression    Hemorrhoids    History of lower GI bleeding 08/31/2018   s/p  colonoscopy 09-01-2018,  hemorrhoids   IDA (iron deficiency anemia)    Inguinal lymphadenopathy    CT 09-14-2018  right side, followed by pcp   PONV (postoperative nausea and vomiting)    only happened once   Wears glasses     Patient Active Problem List   Diagnosis Date Noted   Acute blood loss anemia 05/24/2019   Thrombocytosis ([place]) 05/24/2019   Menorrhagia 11/27/2018   Rectal bleeding 11/26/2018   Inguinal lymphadenopathy 11/24/2018   Right lower quadrant abdominal pain 11/24/2018   Liver lesion 09/09/2018   Hemorrhoid 11/29/2014   Iron deficiency anemia 05/04/2014   Mood disorder ([place]) 02/20/2014   Tears of meniscus and ACL of left knee 09/08/2013   Depression 05/14/2011   H/O: cesarean section 08/28/2010    Past Surgical History:  Procedure Laterality Date   BIOPSY  09/01/2018   Procedure: BIOPSY;  Surgeon: Otis Brace, MD;  Location: [facility];  Service: Gastroenterology;;   CESAREAN SECTION  01/15/2002   @[facility]    CESAREAN SECTION  03/17/2011   Procedure: CESAREAN SECTION;  Surgeon: Osborne Oman, MD;  Location: [facility];  Service: Gynecology;  Laterality: N/A;   COLONOSCOPY WITH PROPOFOL N/A 09/01/2018   Procedure: COLONOSCOPY WITH PROPOFOL;  Surgeon: Otis Brace, MD;  Location: [facility];  Service: Gastroenterology;  Laterality: N/A;   HYSTEROSCOPY WITH D & C  06-21-2009  dr constant @[facility]    w/  REMOVAL IUD    IUD REMOVAL  06/21/2009   KNEE ARTHROSCOPY WITH ANTERIOR CRUCIATE LIGAMENT (ACL) REPAIR Left 09-08-2013   dr [name]  @[facility]    w/  meniscal repair   MULTIPLE TOOTH EXTRACTIONS     RECTAL EXAM UNDER ANESTHESIA N/A 11/18/2018   Procedure: ANORECTAL EXAM UNDER ANESTHESIA;  Surgeon: Ileana Roup, MD;  Location: [facility];  Service: General;  Laterality: N/A;   TUBAL LIGATION Bilateral 2012    OB History    Gravida  4   Para  2   Term  2   Preterm  0   AB  2   Living  2     SAB  0   TAB  2   Ectopic  0   Multiple  0   Live Births  2            Home Medications    Prior to Admission medications   Medication Sig Start Date End Date Taking? Authorizing Provider  acetaminophen (TYLENOL) 500 MG tablet Take 1 tablet (500 mg total) by mouth every 6 (six)  hours as needed. 11/11/19   Malonie Tatum, Myrene Galas, MD  cetirizine (ZYRTEC) 10 MG tablet Take 1 tablet (10 mg total) by mouth daily. 09/12/19   Charlott Rakes, MD  ferrous sulfate 325 (65 FE) MG EC tablet Take 1 tablet (325 mg total) by mouth 2 (two) times daily with a meal. Patient taking differently: Take 325 mg by mouth 3 (three) times daily with meals.  03/30/19   Elsie Stain, MD  ferumoxytol [facility]) 510 MG/17ML SOLN injection Inject 17 mLs (510 mg total) into the vein once for 1 dose. 05/23/19 05/23/19  Elsie Stain, MD  fluticasone (FLONASE) 50 MCG/ACT nasal spray Place 2 sprays into both nostrils daily. 10/05/19   Fulp, Cammie, MD  hydrocortisone (ANUSOL-HC) 25 MG suppository Place 1 suppository (25 mg total) rectally 2 (two) times daily. 11/11/19   [place] Picket, MD  megestrol (MEGACE) 40 MG tablet Take 2 tablets (80 mg total) by mouth 2 (two) times daily. 09/14/19   [name], MD  Multiple Vitamins-Minerals (WOMENS MULTI VITAMIN & MINERAL PO) Take 1 tablet by mouth daily.    [provider]  psyllium (HYDROCIL/METAMUCIL) 95 % PACK Take 1 packet by mouth daily.    [provider]  sertraline (ZOLOFT) 100 MG tablet Take 2 tablets (200 mg total) by mouth at bedtime. 08/08/19 11/11/19  Antony Blackbird, MD    Family History Family History  Problem Relation Age of Onset   Cancer Mother    Kidney failure Mother    Healthy Father     Social History Social History   Tobacco Use   Smoking status: Former Smoker    Years: 5.00    Types: Cigarettes    Quit date: 08/22/2008    Years since quitting: 11.2   Smokeless tobacco: Never Used  Vaping Use   Vaping Use: Never used  Substance Use Topics   Alcohol use: Yes    Comment: occasionally   Drug use: Not Currently    Comment: per pt younger in college marijuana, none since     Allergies   Patient has no known allergies.   Review of Systems Review of Systems  Respiratory: Negative.   Gastrointestinal: Negative.   Genitourinary: Negative.      Physical Exam Triage Vital Signs ED Triage Vitals  Enc Vitals Group     BP 11/11/19 1908 112/78     Pulse Rate 11/11/19 1908 92     Resp 11/11/19 1908 16     Temp 11/11/19 1908 98.9 F (37.2 C)     Temp Source 11/11/19 1908 Oral     SpO2 11/11/19 1908 100 %     Weight 11/11/19 1910 157 lb (71.2 kg)     Height 11/11/19 1910 5\' 2"  (1.575 m)     Head Circumference --      Peak Flow --      Pain Score 11/11/19 1910 9     Pain Loc --      Pain Edu? --      Excl. in [place]? --    No data found.  Updated Vital Signs BP 112/78    Pulse 92    Temp 98.9 F (37.2 C) (Oral)    Resp 16    Ht 5\' 2"  (1.575 m)    Wt 71.2 kg    SpO2 100%    BMI 28.72 kg/m   Visual Acuity Right Eye Distance:   Left Eye Distance:    Bilateral Distance:  Right Eye Near:   Left Eye Near:    Bilateral Near:     Physical Exam Vitals and nursing note reviewed.  Constitutional:      Appearance: Normal appearance. She is not ill-appearing or toxic-appearing.  Cardiovascular:     Rate and Rhythm: Normal rate and regular rhythm.  Pulmonary:     Effort: Pulmonary effort is normal.     Breath sounds: Normal breath sounds.  Abdominal:     General: Bowel sounds are normal.     Palpations: Abdomen is soft.  Musculoskeletal:        General: Normal range of motion.  Skin:    General: Skin is warm.     Capillary Refill: Capillary refill takes less than 2 seconds.  Neurological:     Mental Status: She is alert.      UC Treatments / Results  Labs (all labs ordered are listed, but only abnormal results are displayed) Labs Reviewed - No data to display  EKG   Radiology No results found.  Procedures Procedures (including critical care time)  Medications Ordered in UC Medications - No data to display  Initial Impression / Assessment and Plan / UC Course  I have reviewed the triage vital signs and the nursing notes.  Pertinent labs & imaging results that were available during my care of the patient were reviewed by me and considered in my medical decision making (see chart for details).     1.  Thrombosed internal hemorrhoids: Anusol suppository daily Patient is encouraged to continue stool softeners If symptoms worsen patient is advised to return to urgent care or go to the emergency department for further management Tylenol as needed for pain. Final Clinical Impressions(s) / UC Diagnoses   Final diagnoses:  Internal thrombosed hemorrhoids   Discharge Instructions   None    ED Prescriptions    Medication Sig Dispense Auth. Provider   hydrocortisone (ANUSOL-HC) 25 MG suppository Place 1 suppository (25 mg total) rectally 2 (two) times daily. 12 suppository Elisa Kutner, Myrene Galas, MD   acetaminophen  (TYLENOL) 500 MG tablet Take 1 tablet (500 mg total) by mouth every 6 (six) hours as needed. 30 tablet Meri Pelot, Myrene Galas, MD     PDMP not reviewed this encounter.   [place] Picket, MD 11/11/19 2100

## 2019-11-11 NOTE — ED Triage Notes (Signed)
Pt c/o hemorrhoidsx5 days. Pt c/o 9/10 pain from it.

## 2019-11-13 ENCOUNTER — Encounter (HOSPITAL_COMMUNITY): Payer: Self-pay | Admitting: Emergency Medicine

## 2019-11-13 ENCOUNTER — Other Ambulatory Visit: Payer: Self-pay

## 2019-11-13 ENCOUNTER — Emergency Department (HOSPITAL_COMMUNITY)
Admission: EM | Admit: 2019-11-13 | Discharge: 2019-11-13 | Disposition: A | Discharge status: Home / Self Care | Payer: Medicaid Other | Attending: Emergency Medicine | Admitting: Emergency Medicine

## 2019-11-13 DIAGNOSIS — K625 Hemorrhage of anus and rectum: Secondary | ICD-10-CM | POA: Diagnosis present

## 2019-11-13 DIAGNOSIS — K649 Unspecified hemorrhoids: Secondary | ICD-10-CM | POA: Insufficient documentation

## 2019-11-13 LAB — CBC WITH DIFFERENTIAL/PLATELET
Abs Immature Granulocytes: 0.02 10*3/uL (ref 0.00–0.07)
Basophils Absolute: 0 10*3/uL (ref 0.0–0.1)
Basophils Relative: 1 %
Eosinophils Absolute: 0.3 10*3/uL (ref 0.0–0.5)
Eosinophils Relative: 5 %
HCT: 40 % (ref 36.0–46.0)
Hemoglobin: 12.3 g/dL (ref 12.0–15.0)
Immature Granulocytes: 0 %
Lymphocytes Relative: 36 %
Lymphs Abs: 2.2 10*3/uL (ref 0.7–4.0)
MCH: 27.5 pg (ref 26.0–34.0)
MCHC: 30.8 g/dL (ref 30.0–36.0)
MCV: 89.3 fL (ref 80.0–100.0)
Monocytes Absolute: 0.5 10*3/uL (ref 0.1–1.0)
Monocytes Relative: 8 %
Neutro Abs: 3.1 10*3/uL (ref 1.7–7.7)
Neutrophils Relative %: 50 %
Platelets: 539 10*3/uL — ABNORMAL HIGH (ref 150–400)
RBC: 4.48 MIL/uL (ref 3.87–5.11)
RDW: 13.2 % (ref 11.5–15.5)
WBC: 6.1 10*3/uL (ref 4.0–10.5)
nRBC: 0 % (ref 0.0–0.2)

## 2019-11-13 LAB — BASIC METABOLIC PANEL
Anion gap: 8 (ref 5–15)
BUN: 8 mg/dL (ref 6–20)
CO2: 21 mmol/L — ABNORMAL LOW (ref 22–32)
Calcium: 9.6 mg/dL (ref 8.9–10.3)
Chloride: 109 mmol/L (ref 98–111)
Creatinine, Ser: 0.85 mg/dL (ref 0.44–1.00)
GFR calc Af Amer: >60 mL/min (ref >60)
GFR calc non Af Amer: >60 mL/min (ref >60)
Glucose, Bld: 98 mg/dL (ref 70–99)
Potassium: 4.3 mmol/L (ref 3.5–5.1)
Sodium: 138 mmol/L (ref 135–145)

## 2019-11-13 MED ORDER — HYDROCODONE-ACETAMINOPHEN 5-325 MG PO TABS: 1.0000 | ORAL_TABLET | ORAL | 0 refills | Status: DC | PRN

## 2019-11-13 MED ORDER — OXYCODONE-ACETAMINOPHEN 5-325 MG PO TABS
1.0000 | ORAL_TABLET | Freq: Once | ORAL | Status: AC
  Administered 2019-11-13: 1 via ORAL
  Filled 2019-11-13: qty 1

## 2019-11-13 NOTE — Discharge Instructions (Addendum)
Continue to soak in a tub, to improve your hemorrhoid pain.  Continue using the suppositories that were prescribed.  To keep your stool soft, use Colace, 100 mg, twice a day.  Follow-up with your GI doctor or surgeon for further care and treatment of your hemorrhoids.

## 2019-11-13 NOTE — ED Triage Notes (Signed)
C/o hemorrhoid pain x 7 days.  Seen at [facility] on Friday for same.

## 2019-11-13 NOTE — ED Provider Notes (Signed)
[facility] EMERGENCY DEPARTMENT Provider Note   CSN: [id number] Arrival date & time: 11/13/19  0857     History Chief Complaint  Patient presents with   Hemorrhoids    Leah Baker is a 39 y.o. female.  HPI She presents for evaluation of hemorrhoid pain.  Onset 1 week ago, worsening over the last 2 days with severe sharp pain which is constant and worse with bowel movement.  She denies constipation.  She has had some rectal bleeding over the last 5 days.  She has a history of internal hemorrhoid, with bleeding requiring hospitalization.  She also has a history of a mass on her thigh, near her groin that prevented operative intervention of internal hemorrhoids by her report.  Apparently this mass was biopsied but no definitive abnormality was determined.  She has been back to see general surgery over the last year, for similar pain but has not had resolution of her hemorrhoid difficulty.  She did not eat today, because she was not hungry.  She does not have abdominal pain, fever, chills, weakness or dizziness.  There are no other known modifying factors.    Past Medical History:  Diagnosis Date   Acute costochondritis 11/08/2018   anterior chest wall pain  per pt pcp note in epic on 11-10-2018   Costochondritis, acute 11/10/2018   Depression    Hemorrhoids    History of lower GI bleeding 08/31/2018   s/p  colonoscopy 09-01-2018,  hemorrhoids   IDA (iron deficiency anemia)    Inguinal lymphadenopathy    CT 09-14-2018  right side, followed by pcp   PONV (postoperative nausea and vomiting)    only happened once   Wears glasses     Patient Active Problem List   Diagnosis Date Noted   Acute blood loss anemia 05/24/2019   Thrombocytosis ([place]) 05/24/2019   Menorrhagia 11/27/2018   Rectal bleeding 11/26/2018   Inguinal lymphadenopathy 11/24/2018   Right lower quadrant abdominal pain 11/24/2018   Liver lesion 09/09/2018   Hemorrhoid  11/29/2014   Iron deficiency anemia 05/04/2014   Mood disorder ([place]) 02/20/2014   Tears of meniscus and ACL of left knee 09/08/2013   Depression 05/14/2011   H/O: cesarean section 08/28/2010    Past Surgical History:  Procedure Laterality Date   BIOPSY  09/01/2018   Procedure: BIOPSY;  Surgeon: Otis Brace, MD;  Location: [place];  Service: Gastroenterology;;   CESAREAN SECTION  01/15/2002   @[facility]    CESAREAN SECTION  03/17/2011   Procedure: CESAREAN SECTION;  Surgeon: Osborne Oman, MD;  Location: [facility];  Service: Gynecology;  Laterality: N/A;   COLONOSCOPY WITH PROPOFOL N/A 09/01/2018   Procedure: COLONOSCOPY WITH PROPOFOL;  Surgeon: Otis Brace, MD;  Location: [place];  Service: Gastroenterology;  Laterality: N/A;   HYSTEROSCOPY WITH D & C  06-21-2009  dr constant @[facility]    w/  REMOVAL IUD    IUD REMOVAL  06/21/2009   KNEE ARTHROSCOPY WITH ANTERIOR CRUCIATE LIGAMENT (ACL) REPAIR Left 09-08-2013   dr [name]  @[facility]    w/  meniscal repair   MULTIPLE TOOTH EXTRACTIONS     RECTAL EXAM UNDER ANESTHESIA N/A 11/18/2018   Procedure: ANORECTAL EXAM UNDER ANESTHESIA;  Surgeon: Ileana Roup, MD;  Location: [place];  Service: General;  Laterality: N/A;   TUBAL LIGATION Bilateral 2012     OB History    Gravida  4   Para  2   Term  2   Preterm  0   AB  2   Living  2     SAB  0   TAB  2   Ectopic  0   Multiple  0   Live Births  2           Family History  Problem Relation Age of Onset   Cancer Mother    Kidney failure Mother    Healthy Father     Social History   Tobacco Use   Smoking status: Former Smoker    Years: 5.00    Types: Cigarettes    Quit date: 08/22/2008    Years since quitting: 11.2   Smokeless tobacco: Never Used  Vaping Use   Vaping Use: Never used  Substance Use Topics   Alcohol use: Yes    Comment: occasionally   Drug use: Not Currently    Comment: per pt younger in college  marijuana, none since    Home Medications Prior to Admission medications   Medication Sig Start Date End Date Taking? Authorizing Provider  acetaminophen (TYLENOL) 500 MG tablet Take 1 tablet (500 mg total) by mouth every 6 (six) hours as needed. 11/11/19   Lamptey, Myrene Galas, MD  cetirizine (ZYRTEC) 10 MG tablet Take 1 tablet (10 mg total) by mouth daily. 09/12/19   Charlott Rakes, MD  ferrous sulfate 325 (65 FE) MG EC tablet Take 1 tablet (325 mg total) by mouth 2 (two) times daily with a meal. Patient taking differently: Take 325 mg by mouth 3 (three) times daily with meals.  03/30/19   Elsie Stain, MD  ferumoxytol [facility]) 510 MG/17ML SOLN injection Inject 17 mLs (510 mg total) into the vein once for 1 dose. 05/23/19 05/23/19  Elsie Stain, MD  fluticasone (FLONASE) 50 MCG/ACT nasal spray Place 2 sprays into both nostrils daily. 10/05/19   Fulp, Cammie, MD  HYDROcodone-acetaminophen (NORCO/VICODIN) 5-325 MG tablet Take 1 tablet by mouth every 4 (four) hours as needed for moderate pain. 11/13/19   Daleen Bo, MD  hydrocortisone (ANUSOL-HC) 25 MG suppository Place 1 suppository (25 mg total) rectally 2 (two) times daily. 11/11/19   Chase Picket, MD  megestrol (MEGACE) 40 MG tablet Take 2 tablets (80 mg total) by mouth 2 (two) times daily. 09/14/19   [name], MD  Multiple Vitamins-Minerals (WOMENS MULTI VITAMIN & MINERAL PO) Take 1 tablet by mouth daily.    [provider]  psyllium (HYDROCIL/METAMUCIL) 95 % PACK Take 1 packet by mouth daily.    [provider]  sertraline (ZOLOFT) 100 MG tablet Take 2 tablets (200 mg total) by mouth at bedtime. 08/08/19 11/11/19  Antony Blackbird, MD    Allergies    Patient has no known allergies.  Review of Systems   Review of Systems  All other systems reviewed and are negative.   Physical Exam Updated Vital Signs BP 97/66    Pulse 88    Temp 99.5 F (37.5 C) (Oral)    Resp 20    Ht 5\' 2"  (1.575 m)    Wt 72.6 kg     SpO2 100%    BMI 29.26 kg/m   Physical Exam Vitals and nursing note reviewed.  Constitutional:      General: She is not in acute distress.    Appearance: She is well-developed. She is obese. She is ill-appearing. She is not toxic-appearing or diaphoretic.  HENT:     Head: Normocephalic and atraumatic.  Eyes:     Conjunctiva/sclera: Conjunctivae normal.  Pupils: Pupils are equal, round, and reactive to light.  Neck:     Trachea: Phonation normal.  Cardiovascular:     Rate and Rhythm: Regular rhythm. Tachycardia present.  Pulmonary:     Effort: Pulmonary effort is normal.  Abdominal:     General: There is no distension.  Genitourinary:    Comments: Anus with approximately 6 external hemorrhoids, surrounding uniformly.  Each of these are soft.  On the right one of the hemorrhoids is moderately excoriated, and is a likely source for bleeding.  This area is moderately tender, preventing digital anus/rectal exam. Musculoskeletal:        General: Normal range of motion.     Cervical back: Normal range of motion and neck supple.  Skin:    General: Skin is warm and dry.     Comments: Right medial thigh, approximately 2 o'clock position, about 4 cm below the inguinal crease; there is a subcutaneous mass, indistinct, approximately 1 x 0.5 cm, which is nontender and does not have overlying skin changes.  There is no associated adenopathy  Neurological:     Mental Status: She is alert and oriented to person, place, and time.     Motor: No abnormal muscle tone.  Psychiatric:        Mood and Affect: Mood normal.        Behavior: Behavior normal.        Thought Content: Thought content normal.        Judgment: Judgment normal.     ED Results / Procedures / Treatments   Labs (all labs ordered are listed, but only abnormal results are displayed) Labs Reviewed  BASIC METABOLIC PANEL - Abnormal; Notable for the following components:      Result Value   CO2 21 (*)    All other components  within normal limits  CBC WITH DIFFERENTIAL/PLATELET - Abnormal; Notable for the following components:   Platelets 539 (*)    All other components within normal limits    EKG None  Radiology No results found.  Procedures Procedures (including critical care time)  Medications Ordered in ED Medications  oxyCODONE-acetaminophen (PERCOCET/ROXICET) 5-325 MG per tablet 1 tablet (1 tablet Oral Given 11/13/19 1150)    ED Course  I have reviewed the triage vital signs and the nursing notes.  Pertinent labs & imaging results that were available during my care of the patient were reviewed by me and considered in my medical decision making (see chart for details).  Clinical Course as of Nov 12 1320  Sun Nov 13, 2019  1302 Normal  Basic metabolic panel(!) [EW]  8563 Normal  CBC with Differential(!) [EW]    Clinical Course User Index [EW] Daleen Bo, MD   MDM Rules/Calculators/A&P                           Patient Vitals for the past 24 hrs:  BP Temp Temp src Pulse Resp SpO2 Height Weight  11/13/19 1130 97/66 -- -- 88 -- 100 % -- --  11/13/19 0918 108/78 99.5 F (37.5 C) Oral (!) 106 20 100 % 5\' 2"  (1.575 m) 72.6 kg    1:21 PM Reevaluation with update and discussion. After initial assessment and treatment, an updated evaluation reveals she states she is comfortable at this time.  Findings discussed and questions answered. Daleen Bo   Medical Decision Making:  This patient is presenting for evaluation of painful hemorrhoids, which does require a range  of treatment options, and is a complaint that involves a moderate risk of morbidity and mortality. The differential diagnoses include thrombosed hemorrhoid, infected hemorrhoid, external versus internal hemorrhoids. I decided to review old records, and in summary patient with chronic recurrent hemorrhoid pain, previously evaluated by general surgery, with a mass on her leg which was biopsied.  I did not require additional  historical information from anyone.  Clinical Laboratory Tests Ordered, included CBC and Metabolic panel. Review indicates normal findings..  Critical Interventions-clinical evaluation, laboratory testing, observation and reassessment  After These Interventions, the Patient was reevaluated and was found stable for discharge.  Today the patient has soft external hemorrhoids, at least 1 of which, is excoriated, and a likely source for rectal bleeding.  Unable to assess for internal hemorrhoids due to pain externally.  There is no indication for urgent operative intervention.  She has previously been evaluated for same by general surgery.  It appears that the patient has previously been referred to oncology, Dr. Irene Limbo; but apparently has not had an appointment yet.  I believe that this was to follow-up on the right thigh lesion.   CRITICAL CARE-no Performed by: Daleen Bo  Nursing Notes Reviewed/ Care Coordinated Applicable Imaging Reviewed Interpretation of Laboratory Data incorporated into ED treatment  The patient appears reasonably screened and/or stabilized for discharge and I doubt any other medical condition or other [facility] requiring further screening, evaluation, or treatment in the ED at this time prior to discharge.  Plan: Home Medications-continue usual, add Colace for stool softener; Home Treatments-warm soaks for hemorrhoid care; return here if the recommended treatment, does not improve the symptoms; Recommended follow up-follow-up with general surgery or GI regarding hemorrhoid care.     Final Clinical Impression(s) / ED Diagnoses Final diagnoses:  Bleeding hemorrhoids    Rx / DC Orders ED Discharge Orders         Ordered    HYDROcodone-acetaminophen (NORCO/VICODIN) 5-325 MG tablet  Every 4 hours PRN     Discontinue  Reprint     11/13/19 1320           Daleen Bo, MD 11/13/19 1322

## 2019-11-14 ENCOUNTER — Encounter: Payer: Self-pay | Admitting: Family Medicine

## 2019-11-14 ENCOUNTER — Ambulatory Visit: Payer: Medicaid Other | Attending: Family Medicine | Admitting: Family Medicine

## 2019-11-14 DIAGNOSIS — K645 Perianal venous thrombosis: Secondary | ICD-10-CM

## 2019-11-14 DIAGNOSIS — R590 Localized enlarged lymph nodes: Secondary | ICD-10-CM

## 2019-11-14 NOTE — Progress Notes (Signed)
Virtual Visit via Telephone Note  I connected with Leah Baker, on 11/14/2019 at 4:26 PM by telephone due to the COVID-19 pandemic and verified that I am speaking with the correct person using two identifiers.   Consent: I discussed the limitations, risks, security and privacy concerns of performing an evaluation and management service by telephone and the availability of in person appointments. I also discussed with the patient that there may be a patient responsible charge related to this service. The patient expressed understanding and agreed to proceed.   Location of Patient: Home  Location of Provider: Clinic   Persons participating in Telemedicine visit: Jeaneane Adamec Farrington-CMA Dr. Margarita Rana     History of Present Illness: 39 year old female patient with a history of abnormal uterine bleed who presents today for follow-up of hemorrhoids after an ED visit for internal thrombosed hemorrhoids.  Seen on 11/11/2019 and again yesterday.   She complains of painful bleeding Hemorrhoids and used suppositories which she was prescribed at the ED. She is still having tenderness in her anus.  Saw a general surgeon last year but no surgical management recommended until she undergo a biopsy of right inguinal lymphadenopathy for which she was referred to Dr. Nadeen Landau but never underwent excisional biopsy as she states the pandemic hit.  Lesion has been present for over a year and is sometimes painful and at other times she feels heat radiating from it. The lesion is tender when she has hemorrhoid flare or her menstrual cycle.  Past Medical History:  Diagnosis Date  . Acute costochondritis 11/08/2018   anterior chest wall pain  per pt pcp note in epic on 11-10-2018  . Costochondritis, acute 11/10/2018  . Depression   . Hemorrhoids   . History of lower GI bleeding 08/31/2018   s/p  colonoscopy 09-01-2018,  hemorrhoids  . IDA (iron deficiency anemia)   . Inguinal  lymphadenopathy    CT 09-14-2018  right side, followed by pcp  . PONV (postoperative nausea and vomiting)    only happened once  . Wears glasses    No Known Allergies  Current Outpatient Medications on File Prior to Visit  Medication Sig Dispense Refill  . acetaminophen (TYLENOL) 500 MG tablet Take 1 tablet (500 mg total) by mouth every 6 (six) hours as needed. 30 tablet 0  . cetirizine (ZYRTEC) 10 MG tablet Take 1 tablet (10 mg total) by mouth daily. 30 tablet 1  . ferrous sulfate 325 (65 FE) MG EC tablet Take 1 tablet (325 mg total) by mouth 2 (two) times daily with a meal. (Patient taking differently: Take 325 mg by mouth 3 (three) times daily with meals. ) 90 tablet 3  . fluticasone (FLONASE) 50 MCG/ACT nasal spray Place 2 sprays into both nostrils daily. 16 g 1  . HYDROcodone-acetaminophen (NORCO/VICODIN) 5-325 MG tablet Take 1 tablet by mouth every 4 (four) hours as needed for moderate pain. 15 tablet 0  . hydrocortisone (ANUSOL-HC) 25 MG suppository Place 1 suppository (25 mg total) rectally 2 (two) times daily. 12 suppository 0  . megestrol (MEGACE) 40 MG tablet Take 2 tablets (80 mg total) by mouth 2 (two) times daily. 120 tablet 2  . Multiple Vitamins-Minerals (WOMENS MULTI VITAMIN & MINERAL PO) Take 1 tablet by mouth daily.    . psyllium (HYDROCIL/METAMUCIL) 95 % PACK Take 1 packet by mouth daily.    . ferumoxytol (FERAHEME) 510 MG/17ML SOLN injection Inject 17 mLs (510 mg total) into the vein once for 1 dose.  17 mL 0  . [DISCONTINUED] sertraline (ZOLOFT) 100 MG tablet Take 2 tablets (200 mg total) by mouth at bedtime. 60 tablet 3   No current facility-administered medications on file prior to visit.    Observations/Objective: Awake, alert, oriented x3 Not in acute distress  Assessment and Plan: 1. Inguinal lymphadenopathy We will refer to general surgery-Dr. Nadeen Landau whom the patient saw in the past for excisional biopsy which was recommended by her general  surgeon - Ambulatory referral to General Surgery  2. Internal thrombosed hemorrhoids Will likely need surgical management is conservative management has failed Continue sitz bath, suppositories Avoid constipation Increase fiber intake   Follow Up Instructions: Keep scheduled appointment with PCP   I discussed the assessment and treatment plan with the patient. The patient was provided an opportunity to ask questions and all were answered. The patient agreed with the plan and demonstrated an understanding of the instructions.   The patient was advised to call back or seek an in-person evaluation if the symptoms worsen or if the condition fails to improve as anticipated.     I provided 16 minutes total of non-face-to-face time during this encounter including median intraservice time, reviewing previous notes, investigations, ordering medications, medical decision making, coordinating care and patient verbalized understanding at the end of the visit.     Charlott Rakes, MD, FAAFP. [facility] and [facility], [place]   11/14/2019, 4:26 PM

## 2020-01-24 ENCOUNTER — Telehealth: Payer: Self-pay | Admitting: *Deleted

## 2020-01-24 NOTE — Telephone Encounter (Signed)
-----   Message from Brunetta Genera, MD sent at 01/20/2020  3:58 PM EDT ----- Regarding: RE: What type of follow up? Patient at this time has been lost to follow-up with regards to medical oncology.  We could try 1 more last time to get in touch with her to determine if she is inclined to pursue surgical biopsy and medical oncology follow-up after which we will officially discharged her back to her primary doctor. If she is not inclined to follow-up please recommend she follow-up with her primary doctor for further management. Lujean Rave you [place] ----- Message ----- From: Rolland Bimler, RN Sent: 01/13/2020  11:19 AM EDT To: Brunetta Genera, MD, [facility] Subject: What type of follow up?                        Dr. Irene Limbo, Patient last seen in September 2020. She r/s follow up 4 times after that. Initially referred to [facility] for bx -- that was cancelled and in January 2021,Ct was ordered. It was denied and the following message received. Patient has not contacted office since January 2021 once Ct was denied. Please advise on continued follow up with this patient if any needed. Thanks, Sandi  ----- Message ----- From: Gaspar Bidding Sent: 08/17/2019   8:11 AM EDT To: April H Pait, Gaspar Bidding, # Subject: CT denied                                      Hello Dr. Irene Limbo,  Carleene Overlie denied CT due to:   The reason this request cannot be approved is because:   1. Guidelines do not support imaging in the evaluation of clinically evidenced inguinal lymphadenopathy without biopsy. An excisional or image guided core needle biopsy is supported of the most abnormal lymph node under ultrasound or CT guidance if condition persists or malignancy is suspected. The clinical information provided does not describe a request for an ultrasound or CT guided biopsy or biopsy results that would prompt consideration of imaging and, therefore, the request is not indicated at this time.   2. Your  records show that the same study or a test like it has been done for you. They also show the results of this test. The prior study showed your doctor what they needed to see in order to treat your condition. Repeat imaging is not supported without results of prior imaging that show a clear reason for it.   If you would like a peer to peer, please contact [facility]: Phone: [phone] Service Order: [id number] Date of Birth: 1980/11/22  Please let me know how you want to proceed.  Darlena   ----- Message ----- From: Gaspar Bidding Sent: 08/11/2019   1:11 PM EDT To: Gaspar Bidding Subject: Pending review                                 Service [phone]

## 2020-01-24 NOTE — Telephone Encounter (Signed)
Per Dr.Kale, called pt to f/u about message below. Advised to call if she would like to pursue bx and med onc f/u visit. Message was left on pt vm. Advice to call office to set up appt.

## 2020-01-26 ENCOUNTER — Telehealth: Payer: Self-pay | Admitting: *Deleted

## 2020-01-26 NOTE — Telephone Encounter (Signed)
Pt called office back requesting to move forward with surgical biopsy and med onc f/u. Message was sent to Dr.Kale for response

## 2020-01-27 ENCOUNTER — Telehealth: Payer: Self-pay | Admitting: *Deleted

## 2020-01-27 NOTE — Telephone Encounter (Signed)
Per Dr.Kale, called pt to make aware that she should call [facility] to schedule biopsy appt. Advised pt, once she either has a date or after her biopsy to call and we will schedule appt for Med Once f/u. Pt verbalized understanding.

## 2020-02-01 ENCOUNTER — Other Ambulatory Visit: Payer: Self-pay | Admitting: Gastroenterology

## 2020-02-01 DIAGNOSIS — K769 Liver disease, unspecified: Secondary | ICD-10-CM

## 2020-02-20 ENCOUNTER — Telehealth: Payer: Self-pay | Admitting: *Deleted

## 2020-02-20 NOTE — Telephone Encounter (Signed)
Patient called - Leah Baker. Has been unable to schedule biopsy surgery with surgeon at [facility]. No one has called her back. She has an MRI scheduled on 9/29 ordered by Dr. Hale Bogus [facility] - Leah Baker for referral coordinator with patient referral info and contact info asking them to f/u with patient.  Contacted patient - Leah Baker information patient message left with [facility] to contact her.

## 2020-02-22 ENCOUNTER — Other Ambulatory Visit: Payer: Self-pay | Admitting: Surgery

## 2020-02-22 DIAGNOSIS — R590 Localized enlarged lymph nodes: Secondary | ICD-10-CM

## 2020-02-29 ENCOUNTER — Other Ambulatory Visit: Payer: Self-pay

## 2020-02-29 ENCOUNTER — Ambulatory Visit
Admission: RE | Admit: 2020-02-29 | Discharge: 2020-02-29 | Disposition: A | Discharge status: Home / Self Care | Payer: Medicaid Other | Source: Ambulatory Visit | Attending: Gastroenterology | Admitting: Gastroenterology

## 2020-02-29 DIAGNOSIS — K769 Liver disease, unspecified: Secondary | ICD-10-CM | POA: Diagnosis not present

## 2020-02-29 MED ORDER — GADOBENATE DIMEGLUMINE 529 MG/ML IV SOLN
15.0000 mL | Freq: Once | INTRAVENOUS | Status: AC | PRN
  Administered 2020-02-29: 15 mL via INTRAVENOUS

## 2020-03-01 DIAGNOSIS — K625 Hemorrhage of anus and rectum: Secondary | ICD-10-CM | POA: Diagnosis not present

## 2020-03-01 DIAGNOSIS — Z148 Genetic carrier of other disease: Secondary | ICD-10-CM | POA: Diagnosis not present

## 2020-03-01 DIAGNOSIS — K769 Liver disease, unspecified: Secondary | ICD-10-CM | POA: Diagnosis not present

## 2020-03-01 DIAGNOSIS — K649 Unspecified hemorrhoids: Secondary | ICD-10-CM | POA: Diagnosis not present

## 2020-03-01 DIAGNOSIS — R590 Localized enlarged lymph nodes: Secondary | ICD-10-CM | POA: Diagnosis not present

## 2020-03-07 ENCOUNTER — Other Ambulatory Visit: Payer: Self-pay | Admitting: Family Medicine

## 2020-03-07 DIAGNOSIS — Z1231 Encounter for screening mammogram for malignant neoplasm of breast: Secondary | ICD-10-CM

## 2020-03-12 ENCOUNTER — Encounter: Payer: Self-pay | Admitting: Family Medicine

## 2020-03-20 ENCOUNTER — Ambulatory Visit
Admission: RE | Admit: 2020-03-20 | Discharge: 2020-03-20 | Disposition: A | Discharge status: Home / Self Care | Payer: Medicaid Other | Source: Ambulatory Visit | Attending: Surgery | Admitting: Surgery

## 2020-03-20 ENCOUNTER — Other Ambulatory Visit: Payer: Self-pay

## 2020-03-20 DIAGNOSIS — R1909 Other intra-abdominal and pelvic swelling, mass and lump: Secondary | ICD-10-CM | POA: Diagnosis not present

## 2020-03-20 DIAGNOSIS — R590 Localized enlarged lymph nodes: Secondary | ICD-10-CM

## 2020-03-20 MED ORDER — GADOBENATE DIMEGLUMINE 529 MG/ML IV SOLN
16.0000 mL | Freq: Once | INTRAVENOUS | Status: AC | PRN
  Administered 2020-03-20: 16 mL via INTRAVENOUS

## 2020-03-23 ENCOUNTER — Other Ambulatory Visit: Payer: Self-pay | Admitting: General Surgery

## 2020-03-23 DIAGNOSIS — R590 Localized enlarged lymph nodes: Secondary | ICD-10-CM | POA: Diagnosis not present

## 2020-03-23 MED ORDER — BUPIVACAINE LIPOSOME 1.3 % IJ SUSP: 20.0000 mL | Freq: Once | INTRAMUSCULAR | Status: AC

## 2020-03-27 ENCOUNTER — Other Ambulatory Visit: Payer: Self-pay | Admitting: Family Medicine

## 2020-03-27 NOTE — Progress Notes (Signed)
Patient ID: Leah Baker, female   [name]: 06/18/1980, 39 y.o.   MRN: [id number]   Patient left phone message regarding recent diagnosis of hereditary hemochromatosis by her gastroenterologist.  Notes from gastroenterology are not currently available.  Patient will be referred to hematology for further evaluation.

## 2020-03-28 ENCOUNTER — Encounter: Payer: Self-pay | Admitting: Family Medicine

## 2020-03-28 ENCOUNTER — Other Ambulatory Visit: Payer: Self-pay

## 2020-03-28 ENCOUNTER — Ambulatory Visit: Payer: Medicaid Other | Attending: Family Medicine | Admitting: Family Medicine

## 2020-03-28 DIAGNOSIS — R350 Frequency of micturition: Secondary | ICD-10-CM

## 2020-03-28 DIAGNOSIS — M25562 Pain in left knee: Secondary | ICD-10-CM | POA: Diagnosis not present

## 2020-03-28 DIAGNOSIS — R002 Palpitations: Secondary | ICD-10-CM | POA: Diagnosis not present

## 2020-03-28 DIAGNOSIS — M25561 Pain in right knee: Secondary | ICD-10-CM | POA: Diagnosis not present

## 2020-03-28 DIAGNOSIS — G8929 Other chronic pain: Secondary | ICD-10-CM | POA: Diagnosis not present

## 2020-03-28 LAB — POCT URINALYSIS DIP (CLINITEK)
Bilirubin, UA: NEGATIVE
Glucose, UA: NEGATIVE mg/dL
Ketones, POC UA: NEGATIVE mg/dL
Leukocytes, UA: NEGATIVE
Nitrite, UA: NEGATIVE
Spec Grav, UA: >=1.03 — AB
Urobilinogen, UA: 0.2 U/dL
pH, UA: 5.5

## 2020-03-28 NOTE — Patient Instructions (Signed)
Palpitations Palpitations are feelings that your heartbeat is not normal. Your heartbeat may feel like it is:  Uneven.  Faster than normal.  Fluttering.  Skipping a beat. This is usually not a serious problem. In some cases, you may need tests to rule out any serious problems. Follow these instructions at home: Pay attention to any changes in your condition. Take these actions to help manage your symptoms: Eating and drinking  Avoid: ? Coffee, tea, soft drinks, and energy drinks. ? Chocolate. ? Alcohol. ? Diet pills. Lifestyle   Try to lower your stress. These things can help you relax: ? Yoga. ? Deep breathing and meditation. ? Exercise. ? Using words and images to create positive thoughts (guided imagery). ? Using your mind to control things in your body (biofeedback).  Do not use drugs.  Get plenty of rest and sleep. Keep a regular bed time. General instructions   Take over-the-counter and prescription medicines only as told by your doctor.  Do not use any products that contain nicotine or tobacco, such as cigarettes and e-cigarettes. If you need help quitting, ask your doctor.  Keep all follow-up visits as told by your doctor. This is important. You may need more tests if palpitations do not go away or get worse. Contact a doctor if:  Your symptoms last more than 24 hours.  Your symptoms occur more often. Get help right away if you:  Have chest pain.  Feel short of breath.  Have a very bad headache.  Feel dizzy.  Pass out (faint). Summary  Palpitations are feelings that your heartbeat is uneven or faster than normal. It may feel like your heart is fluttering or skipping a beat.  Avoid food and drinks that may cause palpitations. These include caffeine, chocolate, and alcohol.  Try to lower your stress. Do not smoke or use drugs.  Get help right away if you faint or have chest pain, shortness of breath, a severe headache, or dizziness. This  information is not intended to replace advice given to you by your health care provider. Make sure you discuss any questions you have with your health care provider. Document Revised: 07/01/2017 Document Reviewed: 07/01/2017 Elsevier Patient Education  2020 [employer]  

## 2020-03-28 NOTE — Progress Notes (Signed)
Pt stated that GI provider diagnosed her w/Hemochromatosis but stopped the iron and wants to discuss the new diagnosis  it is not family history   Joint pain today

## 2020-03-28 NOTE — Progress Notes (Signed)
Established Patient Office Visit  Subjective:  Patient ID: Leah Baker, female    [name]: 1981-01-02  Age: 39 y.o. MRN: [id number]  CC:  Chief Complaint  Patient presents with  . Medication Management    HPI Leah Baker, 39 year old female with history of chronic iron deficiency anemia due to menorrhagia, and inguinal lymphadenopathy, who presents secondary to complaint of recent diagnosis of hereditary hemochromatosis by her gastroenterologist and patient has concerns.  She also reports recent increase in chronic joint pain in the left knee for which she has had surgery in the past and has also developed pain and occasional sensation of swelling/inflammation in the right knee.  Pain is worse with ambulation when she gets after being seated for being still for a while.  Pain is sharp at times.  She has had prior knee surgery done by Dr. [name].         On review of systems, patient reports that she does have some occasional right sided frontal headaches/pressure near the ethmoid sinuses.  She is not currently taking her cetirizine.  She also reports occasional sensation of palpitations.  She reports chronic issues with lower abdominal pain.  She has also noticed recent increase in urinary frequency which awakens her from sleep with the need to urinate.  She denies dysuria.  She also continues to have some issues with fatigue.         She does not wish to have her flu shot and she wishes to delay her Covid vaccination until she has upcoming surgery for biopsy of inguinal lymph nodes.  Past Medical History:  Diagnosis Date  . Acute costochondritis 11/08/2018   anterior chest wall pain  per pt pcp note in epic on 11-10-2018  . Costochondritis, acute 11/10/2018  . Depression   . Hemorrhoids   . History of lower GI bleeding 08/31/2018   s/p  colonoscopy 09-01-2018,  hemorrhoids  . IDA (iron deficiency anemia)   . Inguinal lymphadenopathy    CT 09-14-2018  right side, followed by pcp  .  PONV (postoperative nausea and vomiting)    only happened once  . Wears glasses     Past Surgical History:  Procedure Laterality Date  . BIOPSY  09/01/2018   Procedure: BIOPSY;  Surgeon: Otis Brace, MD;  Location: [facility];  Service: Gastroenterology;;  . CESAREAN SECTION  01/15/2002   @[facility]   . CESAREAN SECTION  03/17/2011   Procedure: CESAREAN SECTION;  Surgeon: Osborne Oman, MD;  Location: [facility];  Service: Gynecology;  Laterality: N/A;  . COLONOSCOPY WITH PROPOFOL N/A 09/01/2018   Procedure: COLONOSCOPY WITH PROPOFOL;  Surgeon: Otis Brace, MD;  Location: [facility];  Service: Gastroenterology;  Laterality: N/A;  . HYSTEROSCOPY WITH D & C  06-21-2009  dr constant @[facility]    w/  REMOVAL IUD   . IUD REMOVAL  06/21/2009  . KNEE ARTHROSCOPY WITH ANTERIOR CRUCIATE LIGAMENT (ACL) REPAIR Left 09-08-2013   dr [name]  @[facility]    w/  meniscal repair  . MULTIPLE TOOTH EXTRACTIONS    . RECTAL EXAM UNDER ANESTHESIA N/A 11/18/2018   Procedure: ANORECTAL EXAM UNDER ANESTHESIA;  Surgeon: Ileana Roup, MD;  Location: [facility];  Service: General;  Laterality: N/A;  . TUBAL LIGATION Bilateral 2012    Family History  Problem Relation Age of Onset  . Cancer Mother   . Kidney failure Mother   . Healthy Father     Social History   Socioeconomic History  . Marital  status: Significant Other    Spouse name: Not on file  . Number of children: Not on file  . Years of education: Not on file  . Highest education level: Not on file  Occupational History  . Not on file  Tobacco Use  . Smoking status: Former Smoker    Years: 5.00    Types: Cigarettes    Quit date: 08/22/2008    Years since quitting: 11.6  . Smokeless tobacco: Never Used  Vaping Use  . Vaping Use: Never used  Substance and Sexual Activity  . Alcohol use: Yes    Comment: occasionally  . Drug use: Not Currently    Comment: per pt younger in college marijuana, none since  . Sexual activity: Yes     Birth control/protection: Surgical  Other Topics Concern  . Not on file  Social History Narrative  . Not on file   Social Determinants of Health   Financial Resource Strain:   . Difficulty of Paying Living Expenses: Not on file  Food Insecurity:   . Worried About Charity fundraiser in the Last Year: Not on file  . Ran Out of Food in the Last Year: Not on file  Transportation Needs:   . Lack of Transportation (Medical): Not on file  . Lack of Transportation (Non-Medical): Not on file  Physical Activity:   . Days of Exercise per Week: Not on file  . Minutes of Exercise per Session: Not on file  Stress:   . Feeling of Stress : Not on file  Social Connections:   . Frequency of Communication with Friends and Family: Not on file  . Frequency of Social Gatherings with Friends and Family: Not on file  . Attends Religious Services: Not on file  . Active Member of Clubs or Organizations: Not on file  . Attends Archivist Meetings: Not on file  . Marital Status: Not on file  Intimate Partner Violence:   . Fear of Current or Ex-Partner: Not on file  . Emotionally Abused: Not on file  . Physically Abused: Not on file  . Sexually Abused: Not on file    Outpatient Medications Prior to Visit  Medication Sig Dispense Refill  . cetirizine (ZYRTEC) 10 MG tablet Take 1 tablet (10 mg total) by mouth daily. 30 tablet 1  . fluticasone (FLONASE) 50 MCG/ACT nasal spray Place 2 sprays into both nostrils daily. 16 g 1  . hydrocortisone (ANUSOL-HC) 25 MG suppository Place 1 suppository (25 mg total) rectally 2 (two) times daily. 12 suppository 0  . megestrol (MEGACE) 40 MG tablet Take 2 tablets (80 mg total) by mouth 2 (two) times daily. 120 tablet 2  . Multiple Vitamins-Minerals (WOMENS MULTI VITAMIN & MINERAL PO) Take 1 tablet by mouth daily.    . psyllium (HYDROCIL/METAMUCIL) 95 % PACK Take 1 packet by mouth daily.    Marland Kitchen acetaminophen (TYLENOL) 500 MG tablet Take 1 tablet (500 mg total)  by mouth every 6 (six) hours as needed. (Patient not taking: Reported on 03/28/2020) 30 tablet 0  . ferrous sulfate 325 (65 FE) MG EC tablet Take 1 tablet (325 mg total) by mouth 2 (two) times daily with a meal. (Patient not taking: Reported on 03/28/2020) 90 tablet 3  . ferumoxytol (FERAHEME) 510 MG/17ML SOLN injection Inject 17 mLs (510 mg total) into the vein once for 1 dose. 17 mL 0  . HYDROcodone-acetaminophen (NORCO/VICODIN) 5-325 MG tablet Take 1 tablet by mouth every 4 (four) hours as needed for  moderate pain. (Patient not taking: Reported on 03/28/2020) 15 tablet 0   Facility-Administered Medications Prior to Visit  Medication Dose Route Frequency Provider Last Rate Last Admin  . bupivacaine liposome (EXPAREL) 1.3 % injection 266 mg  20 mL Infiltration Once Stark Klein, MD        No Known Allergies  ROS Review of Systems  Constitutional: Positive for fatigue. Negative for chills and fever.  HENT: Positive for congestion. Negative for sore throat and trouble swallowing.   Eyes: Negative for photophobia and visual disturbance.       Wears glasses  Respiratory: Negative for cough and shortness of breath.   Cardiovascular: Negative for chest pain and palpitations.  Gastrointestinal: Positive for abdominal pain. Negative for constipation, diarrhea and nausea.  Endocrine: Positive for polyuria. Negative for polydipsia and polyphagia.  Genitourinary: Positive for frequency. Negative for dysuria.  Musculoskeletal: Positive for arthralgias and gait problem.  Skin: Negative for rash and wound.  Neurological: Positive for dizziness and headaches.  Hematological: Positive for adenopathy. Does not bruise/bleed easily.  Psychiatric/Behavioral: Negative for suicidal ideas. The patient is nervous/anxious (About health).       Objective:    Physical Exam Vitals and nursing note reviewed.  Constitutional:      General: She is not in acute distress.    Appearance: Normal appearance.    Neck:     Vascular: No carotid bruit.     Comments: Mild symmetric thyroid enlargement/borderline thyromegaly Cardiovascular:     Rate and Rhythm: Normal rate and regular rhythm.  Pulmonary:     Effort: Pulmonary effort is normal.     Breath sounds: Normal breath sounds.  Abdominal:     Palpations: Abdomen is soft.     Tenderness: There is no abdominal tenderness. There is no right CVA tenderness, left CVA tenderness, guarding or rebound.  Musculoskeletal:        General: Tenderness present.     Cervical back: Normal range of motion and neck supple. No rigidity or tenderness.     Right lower leg: No edema.     Left lower leg: No edema.     Comments: Joint line tenderness of the knees bilaterally.  No appreciable edema/effusion of the right knee or left knee on today's exam  Lymphadenopathy:     Cervical: No cervical adenopathy.  Skin:    General: Skin is warm and dry.  Neurological:     General: No focal deficit present.     Mental Status: She is alert and oriented to person, place, and time.  Psychiatric:        Mood and Affect: Mood normal.        Behavior: Behavior normal.     Comments: Initially appeared slightly anxious     BP 109/78 (BP Location: Right Arm, Patient Position: Sitting)   Pulse 82   Temp (!) 96.8 F (36 C)   Ht 5\' 3"  (1.6 m)   Wt 172 lb 6.4 oz (78.2 kg)   SpO2 94%   BMI 30.54 kg/m  Wt Readings from Last 3 Encounters:  03/28/20 172 lb 6.4 oz (78.2 kg)  11/13/19 160 lb (72.6 kg)  11/11/19 157 lb (71.2 kg)     Health Maintenance Due  Topic Date Due  . COVID-19 Vaccine (1) Never done  . INFLUENZA VACCINE  Never done  . MAMMOGRAM  03/03/2020    Mammogram is pending  Lab Results  Component Value Date   TSH 1.480 02/24/2019   Lab Results  Component  Value Date   WBC 6.1 11/13/2019   HGB 12.3 11/13/2019   HCT 40.0 11/13/2019   MCV 89.3 11/13/2019   PLT 539 (H) 11/13/2019   Lab Results  Component Value Date   NA 138 11/13/2019   K 4.3  11/13/2019   CO2 21 (L) 11/13/2019   GLUCOSE 98 11/13/2019   BUN 8 11/13/2019   CREATININE 0.85 11/13/2019   BILITOT 0.3 05/24/2019   ALKPHOS 40 05/24/2019   AST 18 05/24/2019   ALT 15 05/24/2019   PROT 6.0 (L) 05/24/2019   ALBUMIN 3.5 05/24/2019   CALCIUM 9.6 11/13/2019   ANIONGAP 8 11/13/2019   No results found for: CHOL No results found for: HDL No results found for: LDLCALC No results found for: TRIG No results found for: CHOLHDL No results found for: HGBA1C    Assessment & Plan:  1. Hereditary hemochromatosis ([place]) Recent notes from patient's GI doctor at [facility] reviewed.  Lab results were not included in the recent note therefore patient signed record release so that labs can be obtained as CMA was unable to directly speak with anyone when she called during patient's appointment.  Referral has been placed for patient to follow-up with hematology and this medical condition was discussed with the patient to her satisfaction at today's visit.  2. Chronic pain of both knees Patient with chronic pain of both knees and has had prior left knee surgery and has had more recent onset of pain in the right knee.  She also feels as if the right knee is swollen at times.  Will check arthritis panel but discussed with patient that she has likely developed some osteoarthritis in the right knee as well as she likely compensated for her left knee pain by more frequent use of the right knee/leg.  Referral is being placed for patient to follow-up with Dr. [name] with whom her original surgery on the left knee was done. - Arthritis Panel - AMB referral to orthopedics  3. Palpitations Patient with complaint of occasional palpitations.  Will check basic metabolic panel and thyroid panel at today's visit.  She also some complains of some dizziness and facial pain and she has been asked to restart the use of her cetirizine but schedule follow-up if the symptoms do not improve.  Educational  material provided regarding palpitations. - Basic Metabolic Panel - Thyroid Panel With TSH  4. Urinary frequency Patient with complaint of recent onset of urinary frequency which awakens her from sleep with the need to urinate.  Will check basic metabolic panel to look for elevated glucose as well as urinalysis to look for signs of urinary tract infection and will send urine for culture and she will be notified if any further treatment is needed based on these results. - Basic Metabolic Panel - POCT URINALYSIS DIP (CLINITEK) - Urine Culture   Total time for today's visit was greater than 40 minutes including review of outside records, obtaining history and physical examination, discussing assessment and treatment plan with the patient and scheduling for labs and outside referrals.  Follow-up: Return in about 4 months (around 07/29/2020) for chronic issues and as needed.    Antony Blackbird, MD

## 2020-03-29 LAB — BASIC METABOLIC PANEL WITH GFR
BUN/Creatinine Ratio: 12 (ref 9–23)
BUN: 13 mg/dL (ref 6–24)
CO2: 18 mmol/L — ABNORMAL LOW (ref 20–29)
Calcium: 9.8 mg/dL (ref 8.7–10.2)
Chloride: 105 mmol/L (ref 96–106)
Creatinine, Ser: 1.06 mg/dL — ABNORMAL HIGH (ref 0.57–1.00)
GFR calc Af Amer: 75 mL/min/1.73
GFR calc non Af Amer: 65 mL/min/1.73
Glucose: 91 mg/dL (ref 65–99)
Potassium: 4.2 mmol/L (ref 3.5–5.2)
Sodium: 136 mmol/L (ref 134–144)

## 2020-03-29 LAB — ARTHRITIS PANEL
Anti Nuclear Antibody (ANA): NEGATIVE
Rheumatoid fact SerPl-aCnc: <10 [IU]/mL (ref 0.0–13.9)
Sed Rate: 5 mm/h (ref 0–32)
Uric Acid: 4.4 mg/dL (ref 2.6–6.2)

## 2020-03-29 LAB — THYROID PANEL WITH TSH
Free Thyroxine Index: 1.2 (ref 1.2–4.9)
T3 Uptake Ratio: 24 % (ref 24–39)
T4, Total: 5.2 ug/dL (ref 4.5–12.0)
TSH: 1.07 u[IU]/mL (ref 0.450–4.500)

## 2020-03-30 LAB — URINE CULTURE

## 2020-04-03 ENCOUNTER — Encounter (HOSPITAL_COMMUNITY): Admission: RE | Admit: 2020-04-03 | Payer: Medicaid Other | Source: Ambulatory Visit

## 2020-04-03 ENCOUNTER — Telehealth: Payer: Self-pay | Admitting: Hematology

## 2020-04-03 ENCOUNTER — Other Ambulatory Visit: Payer: Self-pay | Admitting: *Deleted

## 2020-04-03 DIAGNOSIS — D509 Iron deficiency anemia, unspecified: Secondary | ICD-10-CM

## 2020-04-03 NOTE — Telephone Encounter (Signed)
Scheduled appt per 11/2 sch msg - pt is aware of apt date and time

## 2020-04-04 NOTE — Progress Notes (Signed)
[place], [place] [address] Phone: [phone] Fax: [phone]  [place] - [address] [place] [address] Phone: [phone] Fax: [phone]      Your procedure is scheduled on Thursday, April 12, 2020.  Report to [facility] Main Entrance "A" at 05:30 A.M., and check in at the Admitting office.  Call this number if you have problems or questions between now and the morning of surgery:  [phone]    Remember:  Do not eat after midnight the night before your surgery  You may drink clear liquids until 04:30 the morning of your surgery.   Clear liquids allowed are: Water, Non-Citrus Juices (without pulp), Carbonated Beverages, Clear Tea, Black Coffee Only, and Gatorade    Do not take any medications the morning of surgery.  As of today, STOP taking any Aspirin (unless otherwise instructed by your surgeon) Aleve, Naproxen, Ibuprofen, Motrin, Advil, Goody's, BC's, all herbal medications, fish oil, and all vitamins.                      Do not wear jewelry, make up, or nail polish            Do not wear lotions, powders, perfumes, or deodorant.            Do not shave 48 hours prior to surgery.  Men may shave face and neck.            Do not bring valuables to the hospital.            [facility] is not responsible for any belongings or valuables.  Do NOT Smoke (Tobacco/Vaping) or drink Alcohol 24 hours prior to your procedure  If you use a CPAP at night, you may bring all equipment for your overnight stay.   Contacts, glasses, dentures or bridgework may not be worn into surgery.      For patients admitted to the hospital, discharge time will be determined by your treatment team.   Patients discharged the day of surgery will not be allowed to drive home, and someone needs to stay with them for 24 hours.    Special  instructions:   [facility]- Preparing For Surgery  Before surgery, you can play an important role. Because skin is not sterile, your skin needs to be as free of germs as possible. You can reduce the number of germs on your skin by washing with CHG (chlorahexidine gluconate) Soap before surgery.  CHG is an antiseptic cleaner which kills germs and bonds with the skin to continue killing germs even after washing.    Oral Hygiene is also important to reduce your risk of infection.  Remember - BRUSH YOUR TEETH THE MORNING OF SURGERY WITH YOUR REGULAR TOOTHPASTE  Please do not use if you have an allergy to CHG or antibacterial soaps. If your skin becomes reddened/irritated stop using the CHG.  Do not shave (including legs and underarms) for at least 48 hours prior to first CHG shower. It is OK to shave your face.  Please follow these instructions carefully.   1. Shower the NIGHT BEFORE SURGERY and the MORNING OF SURGERY with CHG Soap.   2. If you chose to wash your hair, wash your hair first as usual with your normal shampoo.  3. After you shampoo, rinse your hair and body thoroughly to remove  the shampoo.  4. Use CHG as you would any other liquid soap. You can apply CHG directly to the skin and wash gently with a scrungie or a clean washcloth.   5. Apply the CHG Soap to your body ONLY FROM THE NECK DOWN.  Do not use on open wounds or open sores. Avoid contact with your eyes, ears, mouth and genitals (private parts). Wash Face and genitals (private parts)  with your normal soap.   6. Wash thoroughly, paying special attention to the area where your surgery will be performed.  7. Thoroughly rinse your body with warm water from the neck down.  8. DO NOT shower/wash with your normal soap after using and rinsing off the CHG Soap.  9. Pat yourself dry with a CLEAN TOWEL.  10. Wear CLEAN PAJAMAS to bed the night before surgery  11. Place CLEAN SHEETS on your bed the night of your first shower and  DO NOT SLEEP WITH PETS.   Day of Surgery: Shower with CHG soap as directed Wear Clean/Comfortable clothing the morning of surgery Do not apply any deodorants/lotions.   Remember to brush your teeth WITH YOUR REGULAR TOOTHPASTE.   Please read over the following fact sheets that you were given.

## 2020-04-05 ENCOUNTER — Other Ambulatory Visit: Payer: Self-pay

## 2020-04-05 ENCOUNTER — Encounter (HOSPITAL_COMMUNITY)
Admission: RE | Admit: 2020-04-05 | Discharge: 2020-04-05 | Disposition: A | Discharge status: Home / Self Care | Payer: Medicaid Other | Source: Ambulatory Visit | Attending: General Surgery | Admitting: General Surgery

## 2020-04-05 ENCOUNTER — Encounter (HOSPITAL_COMMUNITY): Payer: Self-pay

## 2020-04-05 DIAGNOSIS — Z01812 Encounter for preprocedural laboratory examination: Secondary | ICD-10-CM | POA: Diagnosis not present

## 2020-04-05 HISTORY — DX: Anxiety disorder, unspecified: F41.9

## 2020-04-05 LAB — CBC
HCT: 35.1 % — ABNORMAL LOW (ref 36.0–46.0)
Hemoglobin: 10.7 g/dL — ABNORMAL LOW (ref 12.0–15.0)
MCH: 25.2 pg — ABNORMAL LOW (ref 26.0–34.0)
MCHC: 30.5 g/dL (ref 30.0–36.0)
MCV: 82.8 fL (ref 80.0–100.0)
Platelets: 500 10*3/uL — ABNORMAL HIGH (ref 150–400)
RBC: 4.24 MIL/uL (ref 3.87–5.11)
RDW: 17.4 % — ABNORMAL HIGH (ref 11.5–15.5)
WBC: 5.8 10*3/uL (ref 4.0–10.5)
nRBC: 0 % (ref 0.0–0.2)

## 2020-04-05 NOTE — Progress Notes (Signed)
PCP: Dr. Chapman Fitch Cardiologist: denies  EKG: 05/16/2019 CXR: n/a ECHO: denies Stress Test: denies Cardiac Cath: denies  Going for Covid test 11/8  Patient denies shortness of breath, fever, cough, and chest pain at PAT appointment.  Patient verbalized understanding of instructions provided today at the PAT appointment.  Patient asked to review instructions at home and day of surgery.

## 2020-04-09 ENCOUNTER — Other Ambulatory Visit (HOSPITAL_COMMUNITY)
Admission: RE | Admit: 2020-04-09 | Discharge: 2020-04-09 | Disposition: A | Discharge status: Home / Self Care | Payer: Medicaid Other | Source: Ambulatory Visit | Attending: General Surgery | Admitting: General Surgery

## 2020-04-09 DIAGNOSIS — Z01812 Encounter for preprocedural laboratory examination: Secondary | ICD-10-CM | POA: Diagnosis present

## 2020-04-09 DIAGNOSIS — Z20822 Contact with and (suspected) exposure to covid-19: Secondary | ICD-10-CM | POA: Insufficient documentation

## 2020-04-09 LAB — SARS CORONAVIRUS 2 (TAT 6-24 HRS): SARS Coronavirus 2: NEGATIVE

## 2020-04-09 NOTE — H&P (Signed)
[name] Appointment: 03/23/2020 9:00 AM Location: [facility] Patient #: [id number] [name]: 11/02/1980 Single / Language: Cleophus Molt / Race: Black or African American Female   History of Present Illness Stark Klein MD; 03/23/2020 9:30 AM) The patient is a 39 year old female who presents with lymphadenopathy. Pt is a 39 yo F with progressive fatigue and lymphadenopathy. She also has history of anemia with hemorrhoids and menorrhagia. She has found out that she has hemachromatosis and can no longer take iron supplementation. She has pain in her hands now. She denies weight loss, but she does have night sweats. She is up to date with mammograms and pelvic exam/pap and has had a colonoscopy. She has had a core needle lymph node biopsy that was not diagnostic. She presents for lymph node excision for lymphoma workup.  pelvic mri 03/20/2020 FINDINGS: Urinary Tract: The bladder is unremarkable. No bladder mass or calculi.  Bowel: The rectum, sigmoid colon and visualized small bowel loops are unremarkable.  Vascular/Lymphatic: Stable enlarged right-sided pelvic and inguinal lymph nodes.  Stable 9 mm right common iliac node on image 4/6.  Stable 9 mm right external iliac lymph node on image 14/6.  Stable 11 mm right obturator lymph node on image 18/6.  Stable 2 cm right inguinal lymph node on image number 29/6.  No new or progressive findings are identified. The stability would suggest a benign process.  Reproductive: The uterus and ovaries are unremarkable.  Other: No free pelvic fluid collections. No pelvic mass. Artifact from tubal ligation clips are noted.  Musculoskeletal: No significant bony findings.  IMPRESSION: 1. Stable enlarged right-sided pelvic and inguinal lymph nodes. 2. No new or progressive findings.   Allergies (Tanisha A. Owens Shark, [place]; 03/23/2020 9:04 AM) No Known Drug Allergies  [10/29/2018]: Allergies Reconciled   Medication  History (Tanisha A. Owens Shark, [place]; 03/23/2020 9:04 AM) Megestrol Acetate (40MG  Tablet, Oral) Active. Multivitamins/Minerals (Oral) Active. Medications Reconciled    Review of Systems Stark Klein MD; 03/23/2020 9:30 AM) All other systems negative  Vitals (Tanisha A. Brown RMA; 03/23/2020 9:05 AM) 03/23/2020 9:04 AM Weight: 171.8 lb Height: 62in Body Surface Area: 1.79 m Body Mass Index: 31.42 kg/m  Temp.: 97.25F  Pulse: 86 (Regular)  BP: 128/84(Sitting, Left Arm, Standard)       Physical Exam Stark Klein MD; 03/23/2020 9:31 AM) General Mental Status-Alert. General Appearance-Consistent with stated age. Hydration-Well hydrated. Voice-Normal.  Head and Neck Head-normocephalic, atraumatic with no lesions or palpable masses.  Eye Sclera/Conjunctiva - Bilateral-No scleral icterus.  Chest and Lung Exam Chest and lung exam reveals -quiet, even and easy respiratory effort with no use of accessory muscles. Inspection Chest Wall - Normal. Back - normal.  Breast - Did not examine.  Cardiovascular Cardiovascular examination reveals -normal pedal pulses bilaterally. Note: regular rate and rhythm  Abdomen Inspection-Inspection Normal. Palpation/Percussion Palpation and Percussion of the abdomen reveal - Soft, Non Tender, No Rebound tenderness, No Rigidity (guarding) and No hepatosplenomegaly.  Peripheral Vascular Upper Extremity Inspection - Bilateral - Normal - No Clubbing, No Cyanosis, No Edema, Pulses Intact. Lower Extremity Palpation - Edema - Bilateral - No edema - Bilateral.  Neurologic Neurologic evaluation reveals -alert and oriented x 3 with no impairment of recent or remote memory. Mental Status-Normal.  Musculoskeletal Global Assessment -Note: no gross deformities.  Normal Exam - Left-Upper Extremity Strength Normal and Lower Extremity Strength Normal. Normal Exam - Right-Upper Extremity Strength Normal and  Lower Extremity Strength Normal.  Lymphatic Head & Neck  General Head &  Neck Lymphatics: Bilateral - Description - Normal. Axillary  General Axillary Region: Bilateral - Description - Normal. Tenderness - Non Tender. Note: right inguinal adenopathy below the inguinal ligament. one prominent node around 2-3 cm. ? smaller adjacent node. left inguinal region without palpable adenoadenopathy.     Assessment & Plan Stark Klein MD; 03/23/2020 9:33 AM) INGUINAL LYMPHADENOPATHY (R59.0) Impression: Will plan excisional biopsy of right inguinal node. I reviewed the procedure and risks. I discussed that seroma is frequent in inguinal biopsies and that she may require multiple aspirations and/or drain. I also discussed the risk of lack of diagnosis.  I reviewed risks of bleeding as well. I advised that she will need someone to drive and be with her overnight.  She works from home and does yoga for exercise. I discussed post op restrictions. Current Plans You are being scheduled for surgery- Our schedulers will call you.  You should hear from our office's scheduling department within 5 working days about the location, date, and time of surgery. We try to make accommodations for patient's preferences in scheduling surgery, but sometimes the OR schedule or the surgeon's schedule prevents [place] from making those accommodations.  If you have not heard from our office [phone]) in 5 working days, call the office and ask for your surgeon's nurse.  If you have other questions about your diagnosis, plan, or surgery, call the office and ask for your surgeon's nurse.  Pt Education - [facility] General Post-op [facility]CI HEMOCHROMATOSIS (E83.119) Impression: Cannot take ibuprofen or iron.    Signed electronically by Stark Klein, MD (03/23/2020 9:34 AM)

## 2020-04-11 NOTE — Anesthesia Preprocedure Evaluation (Addendum)
Anesthesia Evaluation  Patient identified by MRN, date of birth, ID band Patient awake    Reviewed: Allergy & Precautions, NPO status , Patient's Chart, lab work & pertinent test results  History of Anesthesia Complications (+) PONV and history of anesthetic complications  Airway Mallampati: II  TM Distance: >3 FB Neck ROM: Full    Dental no notable dental hx. (+) Teeth Intact, Dental Advisory Given   Pulmonary neg pulmonary ROS, former smoker,    Pulmonary exam normal breath sounds clear to auscultation       Cardiovascular negative cardio ROS Normal cardiovascular exam Rhythm:Regular Rate:Normal     Neuro/Psych negative neurological ROS  negative psych ROS   GI/Hepatic negative GI ROS, Neg liver ROS,   Endo/Other  negative endocrine ROSMorbid obesity  Renal/GU negative Renal ROS  negative genitourinary   Musculoskeletal negative musculoskeletal ROS (+)   Abdominal   Peds negative pediatric ROS (+)  Hematology negative hematology ROS (+) Blood dyscrasia, anemia ,   Anesthesia Other Findings   Reproductive/Obstetrics negative OB ROS                            Anesthesia Physical  Anesthesia Plan  ASA: III  Anesthesia Plan: MAC   Post-op Pain Management:    Induction: Intravenous  PONV Risk Score and Plan: 3 and Ondansetron, Treatment may vary due to age or medical condition, Dexamethasone and Propofol infusion  Airway Management Planned: Simple Face Mask and Nasal Cannula  Additional Equipment:   Intra-op Plan:   Post-operative Plan: Extubation in OR  Informed Consent: I have reviewed the patients History and Physical, chart, labs and discussed the procedure including the risks, benefits and alternatives for the proposed anesthesia with the patient or authorized representative who has indicated his/her understanding and acceptance.     Dental advisory given  Plan  Discussed with: CRNA and Anesthesiologist  Anesthesia Plan Comments: (  )       Anesthesia Quick Evaluation

## 2020-04-12 ENCOUNTER — Ambulatory Visit (HOSPITAL_COMMUNITY)
Admission: RE | Admit: 2020-04-12 | Discharge: 2020-04-12 | Disposition: A | Discharge status: Home / Self Care | Payer: Medicaid Other | Source: Ambulatory Visit | Attending: General Surgery | Admitting: General Surgery

## 2020-04-12 ENCOUNTER — Ambulatory Visit (HOSPITAL_COMMUNITY): Payer: Medicaid Other | Admitting: Anesthesiology

## 2020-04-12 ENCOUNTER — Other Ambulatory Visit: Payer: Self-pay

## 2020-04-12 ENCOUNTER — Encounter (HOSPITAL_COMMUNITY): Payer: Self-pay | Admitting: General Surgery

## 2020-04-12 ENCOUNTER — Encounter (HOSPITAL_COMMUNITY)
Admission: RE | Disposition: A | Discharge status: Home / Self Care | Payer: Self-pay | Source: Ambulatory Visit | Attending: General Surgery

## 2020-04-12 DIAGNOSIS — R590 Localized enlarged lymph nodes: Secondary | ICD-10-CM | POA: Diagnosis not present

## 2020-04-12 DIAGNOSIS — F418 Other specified anxiety disorders: Secondary | ICD-10-CM | POA: Diagnosis not present

## 2020-04-12 DIAGNOSIS — D75839 Thrombocytosis, unspecified: Secondary | ICD-10-CM | POA: Diagnosis not present

## 2020-04-12 DIAGNOSIS — D509 Iron deficiency anemia, unspecified: Secondary | ICD-10-CM | POA: Diagnosis not present

## 2020-04-12 DIAGNOSIS — J329 Chronic sinusitis, unspecified: Secondary | ICD-10-CM

## 2020-04-12 HISTORY — PX: INGUINAL LYMPH NODE BIOPSY: SHX5865

## 2020-04-12 LAB — POCT PREGNANCY, URINE: Preg Test, Ur: NEGATIVE

## 2020-04-12 SURGERY — BIOPSY, LYMPH NODE, INGUINAL, OPEN
Anesthesia: Monitor Anesthesia Care | Site: Inguinal | Laterality: Right

## 2020-04-12 MED ORDER — 0.9 % SODIUM CHLORIDE (POUR BTL) OPTIME
TOPICAL | Status: DC | PRN
  Administered 2020-04-12: 1000 mL

## 2020-04-12 MED ORDER — MEPERIDINE HCL 25 MG/ML IJ SOLN: 6.2500 mg | INTRAMUSCULAR | Status: DC | PRN

## 2020-04-12 MED ORDER — ORAL CARE MOUTH RINSE: 15.0000 mL | Freq: Once | OROMUCOSAL | Status: AC

## 2020-04-12 MED ORDER — ACETAMINOPHEN 500 MG PO TABS
1000.0000 mg | ORAL_TABLET | ORAL | Status: AC
  Administered 2020-04-12: 1000 mg via ORAL
  Filled 2020-04-12: qty 2

## 2020-04-12 MED ORDER — ONDANSETRON HCL 4 MG/2ML IJ SOLN: 4.0000 mg | Freq: Once | INTRAMUSCULAR | Status: DC | PRN

## 2020-04-12 MED ORDER — PROPOFOL 500 MG/50ML IV EMUL
INTRAVENOUS | Status: DC | PRN
  Administered 2020-04-12: 150 ug/kg/min via INTRAVENOUS

## 2020-04-12 MED ORDER — GABAPENTIN 300 MG PO CAPS
300.0000 mg | ORAL_CAPSULE | ORAL | Status: AC
  Administered 2020-04-12: 300 mg via ORAL
  Filled 2020-04-12: qty 1

## 2020-04-12 MED ORDER — BUPIVACAINE HCL (PF) 0.25 % IJ SOLN
INTRAMUSCULAR | Status: DC | PRN
  Administered 2020-04-12: 10 mL

## 2020-04-12 MED ORDER — CHLORHEXIDINE GLUCONATE CLOTH 2 % EX PADS: 6.0000 | MEDICATED_PAD | Freq: Once | CUTANEOUS | Status: DC

## 2020-04-12 MED ORDER — LACTATED RINGERS IV SOLN: INTRAVENOUS | Status: DC | PRN

## 2020-04-12 MED ORDER — LIDOCAINE-EPINEPHRINE 1 %-1:100000 IJ SOLN
INTRAMUSCULAR | Status: DC | PRN
  Administered 2020-04-12: 10 mL

## 2020-04-12 MED ORDER — ONDANSETRON HCL 4 MG/2ML IJ SOLN
INTRAMUSCULAR | Status: DC | PRN
  Administered 2020-04-12: 4 mg via INTRAVENOUS

## 2020-04-12 MED ORDER — DEXAMETHASONE SODIUM PHOSPHATE 10 MG/ML IJ SOLN
INTRAMUSCULAR | Status: DC | PRN
  Administered 2020-04-12: 10 mg via INTRAVENOUS

## 2020-04-12 MED ORDER — OXYCODONE HCL 5 MG PO TABS
5.0000 mg | ORAL_TABLET | Freq: Once | ORAL | Status: AC | PRN
  Administered 2020-04-12: 5 mg via ORAL

## 2020-04-12 MED ORDER — CHLORHEXIDINE GLUCONATE 0.12 % MT SOLN
15.0000 mL | Freq: Once | OROMUCOSAL | Status: AC
  Administered 2020-04-12: 15 mL via OROMUCOSAL
  Filled 2020-04-12: qty 15

## 2020-04-12 MED ORDER — BUPIVACAINE HCL (PF) 0.25 % IJ SOLN
INTRAMUSCULAR | Status: AC
  Filled 2020-04-12: qty 30

## 2020-04-12 MED ORDER — FENTANYL CITRATE (PF) 100 MCG/2ML IJ SOLN
INTRAMUSCULAR | Status: DC | PRN
  Administered 2020-04-12: 100 ug via INTRAVENOUS
  Administered 2020-04-12: 50 ug via INTRAVENOUS

## 2020-04-12 MED ORDER — OXYCODONE HCL 5 MG PO TABS
ORAL_TABLET | ORAL | Status: AC
  Filled 2020-04-12: qty 1

## 2020-04-12 MED ORDER — ONDANSETRON HCL 4 MG/2ML IJ SOLN
INTRAMUSCULAR | Status: AC
  Filled 2020-04-12: qty 2

## 2020-04-12 MED ORDER — ACETAMINOPHEN 325 MG PO TABS
ORAL_TABLET | ORAL | Status: AC
  Filled 2020-04-12: qty 2

## 2020-04-12 MED ORDER — PROPOFOL 10 MG/ML IV BOLUS
INTRAVENOUS | Status: DC | PRN
  Administered 2020-04-12: 150 mg via INTRAVENOUS

## 2020-04-12 MED ORDER — OXYCODONE HCL 5 MG PO TABS: 5.0000 mg | ORAL_TABLET | Freq: Four times a day (QID) | ORAL | 0 refills | Status: DC | PRN

## 2020-04-12 MED ORDER — LIDOCAINE-EPINEPHRINE 1 %-1:100000 IJ SOLN
INTRAMUSCULAR | Status: AC
  Filled 2020-04-12: qty 1

## 2020-04-12 MED ORDER — ACETAMINOPHEN 160 MG/5ML PO SOLN: 325.0000 mg | ORAL | Status: DC | PRN

## 2020-04-12 MED ORDER — MIDAZOLAM HCL 5 MG/5ML IJ SOLN
INTRAMUSCULAR | Status: DC | PRN
  Administered 2020-04-12: 2 mg via INTRAVENOUS

## 2020-04-12 MED ORDER — DEXAMETHASONE SODIUM PHOSPHATE 10 MG/ML IJ SOLN
INTRAMUSCULAR | Status: AC
  Filled 2020-04-12: qty 1

## 2020-04-12 MED ORDER — FENTANYL CITRATE (PF) 100 MCG/2ML IJ SOLN
INTRAMUSCULAR | Status: AC
  Filled 2020-04-12: qty 2

## 2020-04-12 MED ORDER — ACETAMINOPHEN 325 MG PO TABS
325.0000 mg | ORAL_TABLET | ORAL | Status: DC | PRN
  Administered 2020-04-12: 650 mg via ORAL

## 2020-04-12 MED ORDER — CEFAZOLIN SODIUM-DEXTROSE 2-4 GM/100ML-% IV SOLN
2.0000 g | INTRAVENOUS | Status: AC
  Administered 2020-04-12: 2 g via INTRAVENOUS
  Filled 2020-04-12: qty 100

## 2020-04-12 MED ORDER — FENTANYL CITRATE (PF) 100 MCG/2ML IJ SOLN
25.0000 ug | INTRAMUSCULAR | Status: DC | PRN
  Administered 2020-04-12: 50 ug via INTRAVENOUS

## 2020-04-12 MED ORDER — MIDAZOLAM HCL 2 MG/2ML IJ SOLN
INTRAMUSCULAR | Status: AC
  Filled 2020-04-12: qty 2

## 2020-04-12 MED ORDER — LACTATED RINGERS IV SOLN: INTRAVENOUS | Status: DC

## 2020-04-12 MED ORDER — FENTANYL CITRATE (PF) 250 MCG/5ML IJ SOLN
INTRAMUSCULAR | Status: AC
  Filled 2020-04-12: qty 5

## 2020-04-12 MED ORDER — OXYCODONE HCL 5 MG/5ML PO SOLN: 5.0000 mg | Freq: Once | ORAL | Status: AC | PRN

## 2020-04-12 SURGICAL SUPPLY — 33 items
ADH SKN CLS APL DERMABOND .7 (GAUZE/BANDAGES/DRESSINGS) ×1
CANISTER SUCT 3000ML PPV (MISCELLANEOUS) IMPLANT
CHLORAPREP W/TINT 10.5 ML (MISCELLANEOUS) ×3 IMPLANT
CLIP VESOCCLUDE MED 6/CT (CLIP) ×7 IMPLANT
CLIP VESOCCLUDE SM WIDE 6/CT (CLIP) ×1 IMPLANT
CNTNR URN SCR LID CUP LEK RST (MISCELLANEOUS) ×1 IMPLANT
CONT SPEC 4OZ STRL OR WHT (MISCELLANEOUS) ×3
COVER MAYO STAND STRL (DRAPES) IMPLANT
COVER SURGICAL LIGHT HANDLE (MISCELLANEOUS) ×3 IMPLANT
COVER WAND RF STERILE (DRAPES) ×1 IMPLANT
DERMABOND ADVANCED (GAUZE/BANDAGES/DRESSINGS) ×2
DERMABOND ADVANCED .7 DNX12 (GAUZE/BANDAGES/DRESSINGS) IMPLANT
DRAPE LAPAROTOMY 100X72 PEDS (DRAPES) ×3 IMPLANT
ELECT REM PT RETURN 9FT ADLT (ELECTROSURGICAL) ×3
ELECTRODE REM PT RTRN 9FT ADLT (ELECTROSURGICAL) ×1 IMPLANT
GAUZE 4X4 16PLY RFD (DISPOSABLE) ×1 IMPLANT
GOWN STRL REUS W/ TWL LRG LVL3 (GOWN DISPOSABLE) ×1 IMPLANT
GOWN STRL REUS W/TWL 2XL LVL3 (GOWN DISPOSABLE) ×4 IMPLANT
GOWN STRL REUS W/TWL LRG LVL3 (GOWN DISPOSABLE) ×3
KIT BASIN OR (CUSTOM PROCEDURE TRAY) ×3 IMPLANT
KIT TURNOVER KIT B (KITS) ×3 IMPLANT
NDL HYPO 25GX1X1/2 BEV (NEEDLE) ×1 IMPLANT
NEEDLE HYPO 25GX1X1/2 BEV (NEEDLE) ×3 IMPLANT
NS IRRIG 1000ML POUR BTL (IV SOLUTION) ×3 IMPLANT
PACK GENERAL/GYN (CUSTOM PROCEDURE TRAY) ×3 IMPLANT
PAD ARMBOARD 7.5X6 YLW CONV (MISCELLANEOUS) ×6 IMPLANT
PENCIL SMOKE EVACUATOR (MISCELLANEOUS) ×3 IMPLANT
SUT MON AB 4-0 PC3 18 (SUTURE) ×3 IMPLANT
SUT VIC AB 3-0 SH 27 (SUTURE) ×6
SUT VIC AB 3-0 SH 27X BRD (SUTURE) ×1 IMPLANT
SYR CONTROL 10ML LL (SYRINGE) ×3 IMPLANT
TOWEL GREEN STERILE (TOWEL DISPOSABLE) ×3 IMPLANT
TOWEL GREEN STERILE FF (TOWEL DISPOSABLE) ×3 IMPLANT

## 2020-04-12 NOTE — Anesthesia Procedure Notes (Signed)
Procedure Name: LMA Insertion Date/Time: 04/12/2020 8:11 AM Performed by: Leonor Liv, CRNA Pre-anesthesia Checklist: Patient identified, Emergency Drugs available, Suction available and Patient being monitored Patient Re-evaluated:Patient Re-evaluated prior to induction Oxygen Delivery Method: Circle System Utilized Preoxygenation: Pre-oxygenation with 100% oxygen Induction Type: IV induction Ventilation: Mask ventilation without difficulty LMA: LMA inserted LMA Size: 4.0 Number of attempts: 1 Placement Confirmation: positive ETCO2 Tube secured with: Tape Dental Injury: Teeth and Oropharynx as per pre-operative assessment

## 2020-04-12 NOTE — Op Note (Signed)
Pre op diagnosis:  Lymphadenopathy  Post op diagnosis:  Same  Procedure performed:  Right inguinal lymph node biopsy  Surgeon:  Stark Klein, MD  Anesthesia:  General and local  EBL:  Minimal  Specimen:  R inguinal lymph node for lymphoma workup  Procedure:   Patient was identified in the holding area and taken to the operating room and placed supine on the operating room table.  General anesthesia was induced with LMA.  The patient's right groin was clipped, prepped and draped in sterile fashion.  Time out was performed according to the surgical safety check list.  When all was correct, we continued.  The patient's platelets were finishing running in as we started.   A line was marked for the incision in the right groin.  This was infiltrated with local anesthetic.  The skin was incised with a #10 blade.  The subcutaneous tissues were divided with the cautery.  A Wietlaner retractor was used to assist with visualization.  The clavipectoral fascia was opened.    A very large lymph node was immediately apparent.   Hemaclips were used to ligate the lymphovascular channels entering the node from all sides.   Once this was complete, the node was passed off for lymphoma workup.    The cavity was irrigated copiously.  Hemostasis was achieved with the cautery.  The deep tissues were reapproximated with 3-0 viryl.  The wound was closed with interrupted 3-0 Vicryl deep dermal sutures and 4-0 Monocryl running subcuticular suture.  The wound was cleaned, dried and dressed with Dermabond.    The patient was awakened from anesthesia and taken to the PACU in stable condition.  Needle, sponge, and instrument counts were correct.

## 2020-04-12 NOTE — Anesthesia Postprocedure Evaluation (Signed)
Anesthesia Post Note  Patient: Leah Baker  Procedure(s) Performed: RIGHT INGUINAL LYMPH NODE EXCISIONAL BIOPSY (Right Inguinal)     Patient location during evaluation: PACU Anesthesia Type: MAC Level of consciousness: awake and alert Pain management: pain level controlled Vital Signs Assessment: post-procedure vital signs reviewed and stable Respiratory status: spontaneous breathing, nonlabored ventilation, respiratory function stable and patient connected to nasal cannula oxygen Cardiovascular status: stable and blood pressure returned to baseline Postop Assessment: no apparent nausea or vomiting Anesthetic complications: no   No complications documented.  Last Vitals:  Vitals:   04/12/20 0935 04/12/20 0950  BP: 105/84 111/79  Pulse: 77 78  Resp: 15 17  Temp:    SpO2: 99% 98%    Last Pain:  Vitals:   04/12/20 0935  PainSc: 4                  Felicie Kocher

## 2020-04-12 NOTE — Discharge Instructions (Addendum)
[place] Office Phone Number [phone]   POST OP INSTRUCTIONS  Always review your discharge instruction sheet given to you by the facility where your surgery was performed.  IF YOU HAVE DISABILITY OR FAMILY LEAVE FORMS, YOU MUST BRING THEM TO THE OFFICE FOR PROCESSING.  DO NOT GIVE THEM TO YOUR DOCTOR.  1. A prescription for pain medication may be given to you upon discharge.  Take your pain medication as prescribed, if needed.  If narcotic pain medicine is not needed, then you may take acetaminophen (Tylenol) or ibuprofen (Advil) as needed. 2. Take your usually prescribed medications unless otherwise directed 3. If you need a refill on your pain medication, please contact your pharmacy.  They will contact our office to request authorization.  Prescriptions will not be filled after 5pm or on week-ends. 4. You should eat very light the first 24 hours after surgery, such as soup, crackers, pudding, etc.  Resume your normal diet the day after surgery 5. It is common to experience some constipation if taking pain medication after surgery.  Increasing fluid intake and taking a stool softener will usually help or prevent this problem from occurring.  A mild laxative (Milk of Magnesia or Miralax) should be taken according to package directions if there are no bowel movements after 48 hours. 6. You may shower in 48 hours.  The surgical glue will flake off in 2-3 weeks.   7. ACTIVITIES:  No strenuous activity or heavy lifting for 1 week.   a. You may drive when you no longer are taking prescription pain medication, you can comfortably wear a seatbelt, and you can safely maneuver your car and apply brakes. b. RETURN TO WORK:  __________as tolerated if no lifting or strenuous activity for 1-2 weeks_______________ Dennis Bast should see your doctor in the office for a follow-up appointment approximately three-four weeks after your surgery.    WHEN TO CALL YOUR DOCTOR: 1. Fever over  101.0 2. Nausea and/or vomiting. 3. Extreme swelling or bruising. 4. Continued bleeding from incision. 5. Increased pain, redness, or drainage from the incision.  The clinic staff is available to answer your questions during regular business hours.  Please don't hesitate to call and ask to speak to one of the nurses for clinical concerns.  If you have a medical emergency, go to the nearest emergency room or call 911.  A surgeon from [facility] is always on call at the hospital.  For further questions, please visit [email or web address]

## 2020-04-12 NOTE — Interval H&P Note (Signed)
History and Physical Interval Note:  04/12/2020 7:41 AM  Leah Baker  has presented today for surgery, with the diagnosis of lymphadenopathy.  The various methods of treatment have been discussed with the patient and family. After consideration of risks, benefits and other options for treatment, the patient has consented to  Procedure(s): RIGHT INGUINAL LYMPH NODE BIOPSY (Right) as a surgical intervention.  The patient's history has been reviewed, patient examined, no change in status, stable for surgery.  I have reviewed the patient's chart and labs.  Questions were answered to the patient's satisfaction.     Stark Klein

## 2020-04-12 NOTE — Transfer of Care (Signed)
Immediate Anesthesia Transfer of Care Note  Patient: Leah Baker  Procedure(s) Performed: RIGHT INGUINAL LYMPH NODE EXCISIONAL BIOPSY (Right Inguinal)  Patient Location: PACU  Anesthesia Type:General  Level of Consciousness: awake, alert  and oriented  Airway & Oxygen Therapy: Patient Spontanous Breathing and Patient connected to nasal cannula oxygen  Post-op Assessment: Report given to RN, Post -op Vital signs reviewed and stable and Patient moving all extremities  Post vital signs: Reviewed and stable  Last Vitals:  Vitals Value Taken Time  BP 101/82 04/12/20 0850  Temp 36.7 C 04/12/20 0850  Pulse 87 04/12/20 0902  Resp 10 04/12/20 0902  SpO2 100 % 04/12/20 0902  Vitals shown include unvalidated device data.  Last Pain:  Vitals:   04/12/20 0850  PainSc: 5       Patients Stated Pain Goal: 3 (52/17/47 1595)  Complications: No complications documented.

## 2020-04-12 NOTE — Progress Notes (Signed)
75 mcgs of fentanyl wasted in med room with Cleotis Nipper, RN. Patient discharged and was no longer in the pyxis.  Rowe Pavy, RN

## 2020-04-13 ENCOUNTER — Encounter (HOSPITAL_COMMUNITY): Payer: Self-pay | Admitting: General Surgery

## 2020-04-16 LAB — SURGICAL PATHOLOGY

## 2020-04-17 NOTE — Anesthesia Postprocedure Evaluation (Signed)
Anesthesia Post Note  Patient: Leah Baker  Procedure(s) Performed: RIGHT INGUINAL LYMPH NODE EXCISIONAL BIOPSY (Right Inguinal)     Patient location during evaluation: PACU Anesthesia Type: MAC Level of consciousness: awake and alert Pain management: pain level controlled Vital Signs Assessment: post-procedure vital signs reviewed and stable Respiratory status: spontaneous breathing, nonlabored ventilation, respiratory function stable and patient connected to nasal cannula oxygen Cardiovascular status: stable and blood pressure returned to baseline Postop Assessment: no apparent nausea or vomiting Anesthetic complications: no   No complications documented.  Last Vitals:  Vitals:   04/12/20 0935 04/12/20 0950  BP: 105/84 111/79  Pulse: 77 78  Resp: 15 17  Temp:    SpO2: 99% 98%    Last Pain:  Vitals:   04/12/20 0935  PainSc: 4                  Angelita Harnack

## 2020-04-17 NOTE — Addendum Note (Signed)
Addendum  created 04/17/20 1036 by Janeece Riggers, MD   Clinical Note Signed

## 2020-04-19 ENCOUNTER — Inpatient Hospital Stay: Payer: Medicaid Other | Admitting: Hematology

## 2020-04-19 ENCOUNTER — Inpatient Hospital Stay: Payer: Medicaid Other

## 2020-05-17 ENCOUNTER — Other Ambulatory Visit: Payer: Self-pay | Admitting: Family Medicine

## 2020-05-17 DIAGNOSIS — N920 Excessive and frequent menstruation with regular cycle: Secondary | ICD-10-CM

## 2020-06-08 ENCOUNTER — Telehealth (INDEPENDENT_AMBULATORY_CARE_PROVIDER_SITE_OTHER): Payer: Medicaid Other | Admitting: Family

## 2020-06-08 VITALS — BP 128/86 | HR 94 | Temp 99.4°F | Ht 63.0 in | Wt 168.0 lb

## 2020-06-08 DIAGNOSIS — N921 Excessive and frequent menstruation with irregular cycle: Secondary | ICD-10-CM | POA: Diagnosis not present

## 2020-06-08 DIAGNOSIS — Z7689 Persons encountering health services in other specified circumstances: Secondary | ICD-10-CM

## 2020-06-08 NOTE — Progress Notes (Signed)
Virtual Visit via Telephone Note  I connected with Leah Baker, on 06/08/2020 at 10:56 AM by telephone due to the COVID-19 pandemic and verified that I am speaking with the correct person using two identifiers.  Due to current restrictions/limitations of in-office visits due to the COVID-19 pandemic, this scheduled clinical appointment was converted to a telehealth visit.   Consent: I discussed the limitations, risks, security and privacy concerns of performing an evaluation and management service by telephone and the availability of in person appointments. I also discussed with the patient that there may be a patient responsible charge related to this service. The patient expressed understanding and agreed to proceed.   Location of Patient: Home  Location of Provider: [facility] [place] participating in Telemedicine visit: Wenda Overland, NP Eddie Dibbles, RN  History of Present Illness: Leah Baker is to establish care. Patient has a PMH significant for hemorrhoid, rectal bleeding, thrombocytosis, depression, iron deficiency anemia, liver lesion, and menorrhagia.   Current issues and/or concerns:  Concern of inflammed lymph node near groin. Wants to make sure its not related to lymphoma. Reports had a surgical procedure in November 2021 related to lymph node.  Needing refill of Megestrol. Called for refill from Gynecologist but has not been able to get in contact with them as of yet.    Seeing Hematology related to hemochromatosis and gastrointestinal bleeding. Says appointments were put on hold because of lymph node surgery in November 2021. Has appointment with Hematology end of January/begining of February 2022. Appointments are at the [facility].    Past Medical History:  Diagnosis Date  . Acute costochondritis 11/08/2018   anterior chest wall pain  per pt pcp note in epic on 11-10-2018  . Anemia     [employer]rees[employer] 06/08/2020  . Anxiety   . Costochondritis, acute 11/10/2018  . Depression   . Depression    [employer] 06/08/2020  . Hemorrhoids   . History of lower GI bleeding 08/31/2018   s/p  colonoscopy 09-01-2018,  hemorrhoids  . IDA (iron deficiency anemia)   . Inguinal lymphadenopathy    CT 09-14-2018  right side, followed by pcp  . PONV (postoperative nausea and vomiting)    only happened once  . Wears glasses    Allergies  Allergen Reactions  . Latex Itching and Rash    Current Outpatient Medications on File Prior to Visit  Medication Sig Dispense Refill  . acetaminophen (TYLENOL) 500 MG tablet Take 1 tablet (500 mg total) by mouth every 6 (six) hours as needed. 30 tablet 0  . Menthol-Camphor (TIGER BALM ARTHRITIS RUB EX) Apply 1 application topically daily as needed (knee pain).    . Multiple Vitamins-Minerals (WOMENS MULTI VITAMIN & MINERAL PO) Take 1 tablet by mouth daily.     Marland Kitchen oxyCODONE (OXY IR/ROXICODONE) 5 MG immediate release tablet Take 1 tablet (5 mg total) by mouth every 6 (six) hours as needed for severe pain. 15 tablet 0  . psyllium (HYDROCIL/METAMUCIL) 95 % PACK Take 1 packet by mouth daily.    . megestrol (MEGACE) 40 MG tablet Take 2 tablets (80 mg total) by mouth 2 (two) times daily. (Patient not taking: Reported on 06/08/2020) 120 tablet 2  . [DISCONTINUED] sertraline (ZOLOFT) 100 MG tablet Take 2 tablets (200 mg total) by mouth at bedtime. 60 tablet 3   Current Facility-Administered Medications on File Prior to Visit  Medication Dose Route Frequency Provider Last Rate  Last Admin  . bupivacaine liposome (EXPAREL) 1.3 % injection 266 mg  20 mL Infiltration Once Stark Klein, MD        Observations/Objective: Alert and oriented x 3. Not in acute distress. Physical examination not completed as this is a telemedicine visit.  Assessment and Plan: 1. Encounter to establish care: - Patient presents today to establish care.  - Return in 4 to 6 weeks or sooner if  needed for annual physical examination, labs, and health maintenance. Arrive fasting meaning having had no food and/or nothing to drink for at least 8 hours prior to appointment.  2. Menorrhagia with irregular cycle: - Reports menstruation prolonged and heavy. Requesting Megestrol, says she has taken this in the past.  - Referral to Gynecology for further evaluation and management.  - Patient was given clear instructions to go to Emergency Department or return to medical center if symptoms don't improve, worsen, or new problems develop.The patient verbalized understanding. - Ambulatory referral to Gynecology  3. Hereditary hemochromatosis ([place]): - Keep all appointments with Hematology.   Follow Up Instructions: Follow-up in 4 to 6 weeks for annual physical examination or sooner if needed.   Patient was given clear instructions to go to Emergency Department or return to medical center if symptoms don't improve, worsen, or new problems develop.The patient verbalized understanding.  I discussed the assessment and treatment plan with the patient. The patient was provided an opportunity to ask questions and all were answered. The patient agreed with the plan and demonstrated an understanding of the instructions.   The patient was advised to call back or seek an in-person evaluation if the symptoms worsen or if the condition fails to improve as anticipated.   I provided 10 minutes total of non-face-to-face time during this encounter including median intraservice time, reviewing previous notes, labs, imaging, medications, management and patient verbalized understanding.    Camillia Herter, NP  [facility] [place], [place] 06/09/2020, 9:15 PM

## 2020-06-08 NOTE — Progress Notes (Unsigned)
Current cycle is on it's 11th day, very heavy flow, requesting Megestrol, was previously prescribed, unable to contact GYN

## 2020-06-09 NOTE — Patient Instructions (Signed)
Menorrhagia Menorrhagia is when your menstrual periods are heavy or last longer than normal. Follow these instructions at home: Medicines   Take over-the-counter and prescription medicines exactly as told by your doctor. This includes iron pills.  Do not change or switch medicines without asking your doctor.  Do not take aspirin or medicines that contain aspirin 1 week before or during your period. Aspirin may make bleeding worse. General instructions  If you need to change your pad or tampon more than once every 2 hours, limit your activity until the bleeding stops.  Iron pills can cause problems when pooping (constipation). To prevent or treat pooping problems while taking prescription iron pills, your doctor may suggest that you: ? Drink enough fluid to keep your pee (urine) clear or pale yellow. ? Take over-the-counter or prescription medicines. ? Eat foods that are high in fiber. These foods include:  Fresh fruits and vegetables.  Whole grains.  Beans. ? Limit foods that are high in fat and processed sugars. This includes fried and sweet foods.  Eat healthy meals and foods that are high in iron. Foods that have a lot of iron include: ? Leafy green vegetables. ? Meat. ? Liver. ? Eggs. ? Whole grain breads and cereals.  Do not try to lose weight until your heavy bleeding has stopped and you have normal amounts of iron in your blood. If you need to lose weight, work with your doctor.  Keep all follow-up visits as told by your doctor. This is important. Contact a doctor if:  You soak through a pad or tampon every 1 or 2 hours, and this happens every time you have a period.  You need to use pads and tampons at the same time because you are bleeding so much.  You are taking medicine and you: ? Feel sick to your stomach (nauseous). ? Throw up (vomit). ? Have watery poop (diarrhea).  You have other problems that may be related to the medicine you are taking. Get help  right away if:  You soak through more than a pad or tampon in 1 hour.  You pass clots bigger than 1 inch (2.5 cm) wide.  You feel short of breath.  You feel like your heart is beating too fast.  You feel dizzy or you pass out (faint).  You feel very weak or tired. Summary  Menorrhagia is when your menstrual periods are heavy or last longer than normal.  Take over-the-counter and prescription medicines exactly as told by your doctor. This includes iron pills.  Contact a doctor if you soak through more than a pad or tampon in 1 hour or are passing large clots. This information is not intended to replace advice given to you by your health care provider. Make sure you discuss any questions you have with your health care provider. Document Revised: 08/26/2017 Document Reviewed: 06/09/2016 Elsevier Patient Education  2020 [employer]  

## 2020-06-11 ENCOUNTER — Telehealth: Payer: Self-pay | Admitting: *Deleted

## 2020-06-11 DIAGNOSIS — N920 Excessive and frequent menstruation with regular cycle: Secondary | ICD-10-CM

## 2020-06-11 NOTE — Telephone Encounter (Signed)
Received refill request for megestrol ac 40mg  - take 2 tablets daily. Per chart review seen via virtual visit  09/14/19 by Dr. Kennon Rounds  for DUB and other issues. I called patient and left a message we would like to discuss a refill request we received for you; please call the office. Catelin Manthe,RN

## 2020-06-12 MED ORDER — MEGESTROL ACETATE 40 MG PO TABS: 80.0000 mg | ORAL_TABLET | Freq: Two times a day (BID) | ORAL | 2 refills | Status: DC

## 2020-06-12 NOTE — Addendum Note (Signed)
Addended by: Samuel Germany on: 06/12/2020 08:42 AM   Modules accepted: Orders

## 2020-06-12 NOTE — Telephone Encounter (Signed)
Leah Baker called back and asked for me to return her call. I called Leah Baker and she reports she ran out of the megace and is now on her 14th day of bleeding and would like a refill. We also discussed plan per Dr. Kennon Rounds was for Ablation . She states plan was to get ablation after biopsy of inguinal node which she just had done. She is awaiting final results and plan of care. We disucssed once she is released from surgeon for that she will call to schedule appointment with Dr. Kennon Rounds to schedule Ablation. Reviewed chart and request for refill with Kerry Hough, PA and refill approved and sent in. Jowanna Loeffler,RN

## 2020-06-22 ENCOUNTER — Encounter: Payer: Self-pay | Admitting: Family Medicine

## 2020-06-22 ENCOUNTER — Other Ambulatory Visit: Payer: Self-pay

## 2020-06-22 ENCOUNTER — Telehealth (INDEPENDENT_AMBULATORY_CARE_PROVIDER_SITE_OTHER): Payer: Medicaid Other | Admitting: Family Medicine

## 2020-06-22 DIAGNOSIS — N921 Excessive and frequent menstruation with irregular cycle: Secondary | ICD-10-CM | POA: Diagnosis not present

## 2020-06-22 DIAGNOSIS — F419 Anxiety disorder, unspecified: Secondary | ICD-10-CM | POA: Insufficient documentation

## 2020-06-22 DIAGNOSIS — D500 Iron deficiency anemia secondary to blood loss (chronic): Secondary | ICD-10-CM

## 2020-06-22 NOTE — Progress Notes (Signed)
GYNECOLOGY VIRTUAL VISIT ENCOUNTER NOTE  Provider location: [facility]   I connected with Leah Baker on 06/22/20 at  9:35 AM EST by MyChart Video Encounter at home and verified that I am speaking with the correct person using two identifiers.   I discussed the limitations, risks, security and privacy concerns of performing an evaluation and management service virtually and the availability of in person appointments. I also discussed with the patient that there may be a patient responsible charge related to this service. The patient expressed understanding and agreed to proceed.   History:  Leah Baker is a 40 y.o. [phone] female being evaluated today for  recurrent uterine bleeding. She has been taking Megace which is not really helping though it did stop it. Recently diagnosed with hemochromatosis. We have booked for HTA x 2 in the past, and she has canceled both times. She denies any abnormal vaginal discharge or other concerns.       Past Medical History:  Diagnosis Date  . Acute costochondritis 11/08/2018   anterior chest wall pain  per pt pcp note in epic on 11-10-2018  . Anemia    Phreesia 06/08/2020  . Anxiety   . Costochondritis, acute 11/10/2018  . Depression   . Depression    Phreesia 06/08/2020  . Hemorrhoids   . History of lower GI bleeding 08/31/2018   s/p  colonoscopy 09-01-2018,  hemorrhoids  . IDA (iron deficiency anemia)   . Inguinal lymphadenopathy    CT 09-14-2018  right side, followed by pcp  . PONV (postoperative nausea and vomiting)    only happened once  . Wears glasses    Past Surgical History:  Procedure Laterality Date  . BIOPSY  09/01/2018   Procedure: BIOPSY;  Surgeon: Otis Brace, MD;  Location: [facility];  Service: Gastroenterology;;  . CESAREAN SECTION  01/15/2002   @[facility]   . CESAREAN SECTION  03/17/2011   Procedure: CESAREAN SECTION;  Surgeon: Osborne Oman, MD;  Location: [facility];   Service: Gynecology;  Laterality: N/A;  . COLONOSCOPY WITH PROPOFOL N/A 09/01/2018   Procedure: COLONOSCOPY WITH PROPOFOL;  Surgeon: Otis Brace, MD;  Location: [facility];  Service: Gastroenterology;  Laterality: N/A;  . HYSTEROSCOPY WITH D & C  06-21-2009  dr constant @[facility]    w/  REMOVAL IUD   . INGUINAL LYMPH NODE BIOPSY Right 04/12/2020   Procedure: RIGHT INGUINAL LYMPH NODE EXCISIONAL BIOPSY;  Surgeon: Stark Klein, MD;  Location: [facility];  Service: General;  Laterality: Right;  . IUD REMOVAL  06/21/2009  . KNEE ARTHROSCOPY WITH ANTERIOR CRUCIATE LIGAMENT (ACL) REPAIR Left 09-08-2013   dr [name]  @[facility]    w/  meniscal repair  . MULTIPLE TOOTH EXTRACTIONS    . RECTAL EXAM UNDER ANESTHESIA N/A 11/18/2018   Procedure: ANORECTAL EXAM UNDER ANESTHESIA;  Surgeon: Ileana Roup, MD;  Location: [facility];  Service: General;  Laterality: N/A;  . TUBAL LIGATION Bilateral 2012   The following portions of the patient's history were reviewed and updated as appropriate: allergies, current medications, past family history, past medical history, past social history, past surgical history and problem list.   Health Maintenance:  Normal pap and negative HRHPV on 12/07/2018.  Normal mammogram on 03/04/2019.   Review of Systems:  Pertinent items noted in HPI and remainder of comprehensive ROS otherwise negative.  Physical Exam:   General:  Alert, oriented and cooperative. Patient appears to be in no acute distress.  Mental Status: Normal mood and affect. Normal behavior. Normal judgment and thought content.   Respiratory: Normal respiratory effort, no problems with respiration noted  Rest of physical exam deferred due to type of encounter  Labs and Imaging MRI pelvis, 03/20/2020  Reproductive:  The uterus and ovaries are unremarkable. Assessment and Plan:     Problem List Items Addressed This Visit      Unprioritized   Iron deficiency anemia - Primary   Relevant Orders    CBC   Menorrhagia    Will book for HTA again--has h/o prior C-section x 2 w/ BTL. Risks include but are not limited to bleeding, infection, injury to surrounding structures, including bowel, bladder and ureters, blood clots, and death.  Likelihood of success is high.               I discussed the assessment and treatment plan with the patient. The patient was provided an opportunity to ask questions and all were answered. The patient agreed with the plan and demonstrated an understanding of the instructions.   The patient was advised to call back or seek an in-person evaluation/go to the ED if the symptoms worsen or if the condition fails to improve as anticipated.  I provided 22 minutes of face-to-face time during this encounter. Total time, reviewing records, labs, pathology and documentation = 35 minutes.   Leah Jude, MD [facility], [place]

## 2020-06-22 NOTE — Assessment & Plan Note (Signed)
Will book for HTA again--has h/o prior C-section x 2 w/ BTL. Risks include but are not limited to bleeding, infection, injury to surrounding structures, including bowel, bladder and ureters, blood clots, and death.  Likelihood of success is high.

## 2020-06-22 NOTE — Progress Notes (Signed)
I connected with  Leah Baker on 06/22/20 at  9:35 AM EST by MyChart and verified that I am speaking with the correct person using two identifiers.   I discussed the limitations, risks, security and privacy concerns of performing an evaluation and management service by telephone and the availability of in person appointments. I also discussed with the patient that there may be a patient responsible charge related to this service. The patient expressed understanding and agreed to proceed.  New diagnosis of hemochromatosis, discontinued iron supplement around 9/21. Appt with hematologist mid February.  Annabell Howells, RN 06/22/2020  9:38 AM

## 2020-06-27 ENCOUNTER — Other Ambulatory Visit: Payer: Medicaid Other

## 2020-06-28 ENCOUNTER — Other Ambulatory Visit: Payer: Medicaid Other

## 2020-06-28 ENCOUNTER — Other Ambulatory Visit: Payer: Self-pay

## 2020-06-28 DIAGNOSIS — D500 Iron deficiency anemia secondary to blood loss (chronic): Secondary | ICD-10-CM

## 2020-06-28 DIAGNOSIS — N921 Excessive and frequent menstruation with irregular cycle: Secondary | ICD-10-CM

## 2020-06-28 LAB — CBC
Hematocrit: 35.3 % (ref 34.0–46.6)
Hemoglobin: 11.7 g/dL (ref 11.1–15.9)
MCH: 26.1 pg — ABNORMAL LOW (ref 26.6–33.0)
MCHC: 33.1 g/dL (ref 31.5–35.7)
MCV: 79 fL (ref 79–97)
Platelets: 467 10*3/uL — ABNORMAL HIGH (ref 150–450)
RBC: 4.49 x10E6/uL (ref 3.77–5.28)
RDW: 15.7 % — ABNORMAL HIGH (ref 11.7–15.4)
WBC: 7.9 10*3/uL (ref 3.4–10.8)

## 2020-07-29 NOTE — Progress Notes (Signed)
Subjective:    Patient ID: Leah Baker, female    [name]: Sep 24, 1980, 40 y.o.   MRN: [id number]  09/07/18 This is a 40 year old female seen back in follow-up for lower GI bleeding, blood loss anemia, internal hemorrhoids, and iliac and right inguinal lymphadenopathy, and now a new problem regarding menorrhagia.  Since last visit the patient required hospitalization with a hemoglobin of 5 and was determined to have excess uterine bleeding in addition to the ongoing hemorrhoidal bleeding from internal hemorrhoids.  The patient continues to have right lower quadrant abdominal pain and right inguinal lymphadenopathy and right iliac lymphadenopathy.  Note previous abdominal CT scans have not revealed pelvic pathology.  The patient has since seen gynecology Dr. Roselie Awkward upon request of the general surgeon.  It appears that the vaginal and abdominal ultrasound will be obtained and is scheduled next week.  Previous ultrasounds of the abdomen and pelvis in 2014 was normal.  There is been a prior history of heavy vaginal bleeding and this is a compounded the iron deficiency anemia.  1 recommendation of the discharge summary was for the patient received intravenous iron.  In the hospitalization she received 2 units of packed cells and 1 dose of 510 mg Feraheme.  Currently the patient shortness of breath is better but her abdominal pain continues to persist.  She has had no additional chest pains.  Note she cannot take nonsteroidal anti-inflammatories due to the bleeding.  Note also the patient is now on Megace in addition to iron supplementation and this has helped to slow down the menstrual bleeding.  The patient also had hypokalemia which needs to be followed up as well  01/18/2019 Since the last office visit the patient had to go to urgent care on August 4 and then had a follow-up with gynecology on August 5 for menorrhagia and continued abdominal pain.  The patient's been maintained on Megace 40 mg twice  daily.  General surgery states the internal hemorrhoids do not require further treatment and is not the cause of the abdominal pain.  Gynecology feels that a ablation procedure is indicated for the excess menstrual bleeding.  Patient will first need an endometrial biopsy and this is pending.  The patient is yet to receive her Feraheme injections.  The patient had a hemoglobin checked on August 4 it was stable at 12.7. Since last OV went to [facility]  8/4 and saw Gyn 8/5 for menorrhagia and continued abdominal pain.    03/30/2019 This patient was last seen in August and since that time the patient has had an eventful 2 months.  In September the patient was referred to gynecologic oncology for an endometrial biopsy which was negative for cancer.  General gynecology has determined hormonal therapy is the best for her excess bleeding during periods.  The patient has persistent right inguinal lymphadenopathy and iliac lymphadenopathy.  An MRI had been performed and showed not much change from in April CT scan.  The patient comes in today very frustrated and that she feels that she has been to multiple specialists but does not have a clear answer as to what her condition is.  Patient also appears to be under a great deal of stress and that she has multiple family members including her father who is currently in the intensive care unit at [facility] with severe heart failure.  She had seen oncology and recommendation there was to perform a complete lymph node excision of the right inguinal area and she is been  referred back to [facility] surgery for this.  She tried to go for a visit last week but was denied entrance when her temperature was at 99 degrees.  She does not clearly have any evidence of Covid at this time.  She has a new appointment with surgery upcoming and I am going to be messaging the surgeon about the need for the lymph node biopsy.  She has had multiple blood work done by oncology to determine if  there is an oncologic issue and it is been negative so far.  The hemorrhoidal rectal bleeding appears to be stable and is not a major issue.  The gynecologic oncologist indicated that she did not need Feraheme infusions and upon review of her CBCs through July up into this date all of her hemoglobins have stabilized to the 13 range.  She is on oral iron supplementation 4 times daily.  She has significant GI upset when she takes the oral iron.  The patient is still experiencing abdominal pain in the right lower quadrant she states when she was on tramadol this provided the best relief.  She was offered diclofenac but this is causing excess bleeding therefore this was not taken.  She states her pain is on a scale of 0-10  Is 7    05/23/19: Since this last visit with this patient in October the patient has been to the emergency room with a hemoglobin of 8.8 several weeks ago and ongoing menorrhagia.  The patient has a pending appointment with gynecology general surgery and gastroenterology in follow-up.  An MRI of the abdomen is now been ordered at this time.  She does take iron supplementation.  She continues to have pain in the lower abdominal area.  She appears to have minimal rectal bleeding the most the bleeding comes from the vagina.  The inguinal lymphadenopathy continues to be a concern and she has been seen by oncology who also is anticipating MRI of the abdomen.  The MRI of the abdomen appears to been scheduled for 30 December  The patient does have some lightheadedness at times.  She has had no syncope as well.   07/30/2020 This is a 40 year old female seen here today in follow-up for chronic medical conditions.  I last saw this patient in December 2020.  Since that time in 2021 the patient was followed by Dr. Chapman Fitch who is now left the practice.  Patient was receiving Megace to control significant menorrhagia.  She also had right lower quadrant iliac lymph node which was biopsied since I last  saw her.  Biopsy did reveal lymphoid hyperplasia.  In the interim she also was seen by gastroenterology and they diagnosed hemochromatosis familial.  We do not have any of those records.  When I last saw her in December 2020 her ferritin levels were extremely low and there was no signs of excess iron overload.  Rather the patient had iron deficiency because of chronic bleeding from the rectum and vagina areas.  Patient still has occasional rectal bleeding.  We have yet to had this completely evaluated.  Patient is due to have a D&C and associated hydrotherapy of her endometrium with ablation technique.  This procedure scheduled March 23 with Dr. Kennon Rounds.  Today the patient's largest complaint is that of ongoing anxiety.  Her father is undergoing heart transplant and her grandmother has stage IV cancer in hospice.    Past Medical History:  Diagnosis Date  . Acute costochondritis 11/08/2018   anterior chest wall pain  per pt pcp note in epic on 11-10-2018  . Anemia    Phreesia 06/08/2020  . Anxiety   . Costochondritis, acute 11/10/2018  . Depression   . Depression    Phreesia 06/08/2020  . Hemorrhoids   . History of lower GI bleeding 08/31/2018   s/p  colonoscopy 09-01-2018,  hemorrhoids  . IDA (iron deficiency anemia)   . Inguinal lymphadenopathy    CT 09-14-2018  right side, followed by pcp  . PONV (postoperative nausea and vomiting)    only happened once  . Wears glasses      Family History  Problem Relation Age of Onset  . Cancer Mother   . Kidney failure Mother   . Healthy Father      Social History   Socioeconomic History  . Marital status: Significant Other    Spouse name: Not on file  . Number of children: Not on file  . Years of education: Not on file  . Highest education level: Not on file  Occupational History  . Not on file  Tobacco Use  . Smoking status: Former Smoker    Years: 5.00    Types: Cigarettes    Quit date: 08/22/2008    Years since quitting: 11.9   . Smokeless tobacco: Never Used  Vaping Use  . Vaping Use: Never used  Substance and Sexual Activity  . Alcohol use: Yes    Comment: occasionally  . Drug use: Not Currently    Comment: per pt younger in college marijuana, none since  . Sexual activity: Yes    Birth control/protection: Surgical  Other Topics Concern  . Not on file  Social History Narrative  . Not on file   Social Determinants of Health   Financial Resource Strain: Not on file  Food Insecurity: Not on file  Transportation Needs: Not on file  Physical Activity: Not on file  Stress: Not on file  Social Connections: Not on file  Intimate Partner Violence: Not on file     Allergies  Allergen Reactions  . Latex Itching and Rash     Outpatient Medications Prior to Visit  Medication Sig Dispense Refill  . acetaminophen (TYLENOL) 500 MG tablet Take 1 tablet (500 mg total) by mouth every 6 (six) hours as needed. 30 tablet 0  . megestrol (MEGACE) 40 MG tablet Take 2 tablets (80 mg total) by mouth 2 (two) times daily. 120 tablet 2  . Menthol-Camphor (TIGER BALM ARTHRITIS RUB EX) Apply 1 application topically daily as needed (knee pain).    . Ferrous Sulfate (IRON) 325 (65 Fe) MG TABS  (Patient not taking: Reported on 07/30/2020)    . Multiple Vitamins-Minerals (WOMENS MULTI VITAMIN & MINERAL PO) Take 1 tablet by mouth daily.  (Patient not taking: Reported on 07/30/2020)    . polyethylene glycol (MIRALAX / GLYCOLAX) 17 g packet  (Patient not taking: Reported on 07/30/2020)    . psyllium (HYDROCIL/METAMUCIL) 95 % PACK Take 1 packet by mouth daily. (Patient not taking: Reported on 07/30/2020)     Facility-Administered Medications Prior to Visit  Medication Dose Route Frequency Provider Last Rate Last Admin  . bupivacaine liposome (EXPAREL) 1.3 % injection 266 mg  20 mL Infiltration Once Stark Klein, MD         Constitutional:   No  weight loss, night sweats,  Fevers, chills, fatigue, lassitude. HEENT:   No headaches,   Difficulty swallowing,  Tooth/dental problems,  Sore throat,  No sneezing, itching, ear ache, nasal congestion, post nasal drip,   CV:   chest pain,  Orthopnea, PND, swelling in lower extremities, anasarca, dizziness, palpitations  GI   heartburn, indigestion, abdominal pain, nausea, vomiting, diarrhea, change in bowel habits, loss of appetite,  BLood in stool  Resp: No shortness of breath with exertion or at rest.  No excess mucus, no productive cough,  No non-productive cough,  No coughing up of blood.  No change in color of mucus.  No wheezing.  No chest wall deformity  Skin: no rash or lesions. GYN: heavy prolonged menses  GU: no dysuria, change in color of urine, no urgency or frequency.  No flank pain.  MS:  No joint pain or swelling.  No decreased range of motion.  No back pain.  Psych:  No change in mood or affect. No depression or anxiety.  No memory loss.     Objective:   Physical Exam  Vitals:   07/30/20 0849  BP: (!) 131/94  Pulse: 97  Resp: 16  SpO2: 99%    Gen: Pleasant, well-nourished, in no distress,  normal affect  ENT: No lesions,  mouth clear,  oropharynx clear, no postnasal drip  Neck: No JVD, no TMG, no carotid bruits  Lungs: No use of accessory muscles, no dullness to percussion, clear without rales or rhonchi  Cardiovascular: RRR, heart sounds normal, no murmur or gallops, no peripheral edema  Abdomen: soft and NT, no HSM,  BS normal  Musculoskeletal: No deformities, no cyanosis or clubbing  Neuro: alert, non focal  Skin: Warm, no lesions or rashes  No results found.  CBC Latest Ref Rng & Units 06/28/2020 04/05/2020 11/13/2019  WBC 3.4 - 10.8 x10E3/uL 7.9 5.8 6.1  Hemoglobin 11.1 - 15.9 g/dL 11.7 10.7(L) 12.3  Hematocrit 34.0 - 46.6 % 35.3 35.1(L) 40.0  Platelets 150 - 450 x10E3/uL 467(H) 500(H) 539(H)   BMP Latest Ref Rng & Units 03/28/2020 11/13/2019 05/24/2019  Glucose 65 - 99 mg/dL 91 98 89  BUN 6 - 24 mg/dL 13 8 9    Creatinine 0.57 - 1.00 mg/dL 1.06(H) 0.85 0.84  BUN/Creat Ratio 9 - 23 12 - -  Sodium 134 - 144 mmol/L 136 138 139  Potassium 3.5 - 5.2 mmol/L 4.2 4.3 3.6  Chloride 96 - 106 mmol/L 105 109 111  CO2 20 - 29 mmol/L 18(L) 21(L) 20(L)  Calcium 8.7 - 10.2 mg/dL 9.8 9.6 8.6(L)   12/16/18 Vag U/S IMPRESSION: 1. RIGHT adnexal mass, discrete from the RIGHT ovary is 1.9 centimeters and avascular on Doppler evaluation. This likely represents enlarged RIGHT external iliac lymph node as seen on previous CT exams of the abdomen and pelvis 09/14/2018 and 08/31/2018. 2. Given the persistence of RIGHT iliac lymph node enlargement, consider biopsy of RIGHT inguinal lymph node, seen on prior CT exam. 3. Normal endometrial thickness. If bleeding remains unresponsive to hormonal or medical therapy, sonohysterogram should be considered for focal lesion work-up. (Ref: Radiological Reasoning: Algorithmic  Iron level 14- 17  (low normal 28) from 6/27  MRI pelvis:9/22 IMPRESSION: Persistent right inguinal and iliac adenopathy, previously biopsied and within 1-2 mm of size when compared to 09/14/2018. Significance uncertain given unilateral enlargement, unusual for pelvic nodes with reactive changes. Imaging appearance remains nonspecific but again raises the question of lymphoproliferative disorder. Correlate with previous biopsy results with imaging and clinical follow-up as warranted.  Mass on ultrasound likely represented pelvic sidewall/external iliac nodal enlargement on the right.  Mammogram 03/2019 : neg  10/30:  [place] abd: IMPRESSION: 2.6 cm subcapsular left hepatic hypoechoic lesion by ultrasound remains indeterminate and correlates with the CT finding. Recommend follow-up nonemergent evaluation with MRI without and with contrast.  Negative for gallstones or biliary dilatation  No other acute finding by ultrasound.     Assessment & Plan:  I personally reviewed all images and lab  data in the [facility] system as well as any outside material available during this office visit and agree with the  radiology impressions.   Rectal bleeding Rectal bleeding with internal hemorrhoid likely  We will have Dr. Judeth Horn evaluate  Lymphoid hyperplasia Right lower inguinal lymph node enlargement on biopsy not lymphoma but lymphoid hyperplasia reactive  Follow-up per hematology  Hemochromatosis Apparent genetic testing done showing familial hemochromatosis  We will obtain records from gastroenterology and repeat iron panel and liver function  Iron deficiency anemia Last hemoglobin was normal will repeat liver function panel and iron panel  Patient no longer on iron supplementation with issue of hemochromatosis  Liver lesion MRI showed stable follicular hyperplasia in the liver in 1 location without change based on September 2021 MRI and no evidence of hemochromatosis in the liver at that visit  Menorrhagia Patient encouraged to keep upcoming H TA procedure with gynecology  Mood disorder Referral to clinical social work made  Depression Referral to clinical social work made   [place] was seen today for follow-up and anxiety.  Diagnoses and all orders for this visit:  Lymphoid hyperplasia -     Ambulatory referral to Oncology  Hereditary hemochromatosis ([place]) -     Ambulatory referral to Oncology -     Comprehensive metabolic panel -     CBC with Differential/Platelet -     Iron, TIBC and Ferritin Panel  Rectal bleeding  Iron deficiency anemia due to chronic blood loss  Liver lesion  Menorrhagia with irregular cycle  Mood disorder ([place])  Reactive depression   Patient agrees to obtain mammogram

## 2020-07-30 ENCOUNTER — Ambulatory Visit: Payer: Medicaid Other | Admitting: Family Medicine

## 2020-07-30 ENCOUNTER — Encounter: Payer: Self-pay | Admitting: Critical Care Medicine

## 2020-07-30 ENCOUNTER — Ambulatory Visit: Payer: Medicaid Other | Attending: Critical Care Medicine | Admitting: Critical Care Medicine

## 2020-07-30 ENCOUNTER — Other Ambulatory Visit: Payer: Self-pay

## 2020-07-30 VITALS — BP 131/94 | HR 97 | Resp 16

## 2020-07-30 DIAGNOSIS — K625 Hemorrhage of anus and rectum: Secondary | ICD-10-CM | POA: Diagnosis not present

## 2020-07-30 DIAGNOSIS — N921 Excessive and frequent menstruation with irregular cycle: Secondary | ICD-10-CM

## 2020-07-30 DIAGNOSIS — D500 Iron deficiency anemia secondary to blood loss (chronic): Secondary | ICD-10-CM | POA: Diagnosis not present

## 2020-07-30 DIAGNOSIS — F329 Major depressive disorder, single episode, unspecified: Secondary | ICD-10-CM | POA: Diagnosis not present

## 2020-07-30 DIAGNOSIS — R599 Enlarged lymph nodes, unspecified: Secondary | ICD-10-CM | POA: Diagnosis not present

## 2020-07-30 DIAGNOSIS — K769 Liver disease, unspecified: Secondary | ICD-10-CM

## 2020-07-30 DIAGNOSIS — F39 Unspecified mood [affective] disorder: Secondary | ICD-10-CM | POA: Diagnosis not present

## 2020-07-30 NOTE — Assessment & Plan Note (Signed)
Last hemoglobin was normal will repeat liver function panel and iron panel  Patient no longer on iron supplementation with issue of hemochromatosis

## 2020-07-30 NOTE — Assessment & Plan Note (Signed)
Rectal bleeding with internal hemorrhoid likely  We will have Dr. Judeth Horn evaluate

## 2020-07-30 NOTE — Assessment & Plan Note (Signed)
Patient encouraged to keep upcoming H TA procedure with gynecology

## 2020-07-30 NOTE — Assessment & Plan Note (Signed)
MRI showed stable follicular hyperplasia in the liver in 1 location without change based on September 2021 MRI and no evidence of hemochromatosis in the liver at that visit

## 2020-07-30 NOTE — Assessment & Plan Note (Signed)
Apparent genetic testing done showing familial hemochromatosis  We will obtain records from gastroenterology and repeat iron panel and liver function

## 2020-07-30 NOTE — Assessment & Plan Note (Signed)
Referral to clinical social work made

## 2020-07-30 NOTE — Assessment & Plan Note (Signed)
Right lower inguinal lymph node enlargement on biopsy not lymphoma but lymphoid hyperplasia reactive  Follow-up per hematology

## 2020-07-30 NOTE — Patient Instructions (Signed)
Keep your appointment with Dr. Kennon Rounds for your ablation and [place] c  Referral back to Dr. Velvet Bathe be given for your lymphoid hyperplasia and hemochromatosis  Please call and get your mammogram completed  Repeat labs sent today include blood count metabolic panel iron studies  We will have you sign paperwork to obtain records from your gastroenterology doctor  I will serve as your primary care for provider   Referral to Christa See our clinical social worker is made for counseling with regards to your anxiety  Continue Megace as prescribed  Referral to Dr. Hulen Skains our volunteer general surgeon will be made for your internal hemorrhoids and rectal bleeding  Return to see Dr. [name] 6 weeks

## 2020-07-31 LAB — CBC WITH DIFFERENTIAL/PLATELET
Basophils Absolute: 0.1 10*3/uL (ref 0.0–0.2)
Basos: 1 %
EOS (ABSOLUTE): 0.2 10*3/uL (ref 0.0–0.4)
Eos: 3 %
Hematocrit: 33.6 % — ABNORMAL LOW (ref 34.0–46.6)
Hemoglobin: 10.6 g/dL — ABNORMAL LOW (ref 11.1–15.9)
Immature Grans (Abs): 0 10*3/uL (ref 0.0–0.1)
Immature Granulocytes: 0 %
Lymphocytes Absolute: 2.2 10*3/uL (ref 0.7–3.1)
Lymphs: 33 %
MCH: 26.6 pg (ref 26.6–33.0)
MCHC: 31.5 g/dL (ref 31.5–35.7)
MCV: 84 fL (ref 79–97)
Monocytes Absolute: 0.7 10*3/uL (ref 0.1–0.9)
Monocytes: 10 %
Neutrophils Absolute: 3.7 10*3/uL (ref 1.4–7.0)
Neutrophils: 53 %
Platelets: 513 10*3/uL — ABNORMAL HIGH (ref 150–450)
RBC: 3.99 x10E6/uL (ref 3.77–5.28)
RDW: 14.7 % (ref 11.7–15.4)
WBC: 6.8 10*3/uL (ref 3.4–10.8)

## 2020-07-31 LAB — IRON,TIBC AND FERRITIN PANEL
Ferritin: 9 ng/mL — ABNORMAL LOW (ref 15–150)
Iron Saturation: 12 % — ABNORMAL LOW (ref 15–55)
Iron: 61 ug/dL (ref 27–159)
Total Iron Binding Capacity: 514 ug/dL — ABNORMAL HIGH (ref 250–450)
UIBC: 453 ug/dL — ABNORMAL HIGH (ref 131–425)

## 2020-07-31 LAB — COMPREHENSIVE METABOLIC PANEL
ALT: 19 IU/L (ref 0–32)
AST: 22 IU/L (ref 0–40)
Albumin/Globulin Ratio: 1.6 (ref 1.2–2.2)
Albumin: 4.6 g/dL (ref 3.8–4.8)
Alkaline Phosphatase: 66 IU/L (ref 44–121)
BUN/Creatinine Ratio: 12 (ref 9–23)
BUN: 12 mg/dL (ref 6–24)
Bilirubin Total: 0.4 mg/dL (ref 0.0–1.2)
CO2: 18 mmol/L — ABNORMAL LOW (ref 20–29)
Calcium: 9.5 mg/dL (ref 8.7–10.2)
Chloride: 107 mmol/L — ABNORMAL HIGH (ref 96–106)
Creatinine, Ser: 1.01 mg/dL — ABNORMAL HIGH (ref 0.57–1.00)
Globulin, Total: 2.8 g/dL (ref 1.5–4.5)
Glucose: 95 mg/dL (ref 65–99)
Potassium: 4.4 mmol/L (ref 3.5–5.2)
Sodium: 139 mmol/L (ref 134–144)
Total Protein: 7.4 g/dL (ref 6.0–8.5)
eGFR: 72 mL/min/{1.73_m2} (ref >59)

## 2020-08-02 NOTE — Progress Notes (Signed)
HEMATOLOGY/ONCOLOGY CONSULTATION NOTE  Date of Service: 08/02/2020  Patient Care Team: Elsie Stain, MD as PCP - General (Pulmonary Disease)  CHIEF COMPLAINTS/PURPOSE OF CONSULTATION:  Pelvic Lymphadenopathy  HISTORY OF PRESENTING ILLNESS:   Leah Baker is a wonderful 40 y.o. female who has been referred to [place] by Dr Denman George for evaluation and management of pelvic lymphadenopathy. The pt reports that she is doing well overall.   The pt reports that her pelvic lymphadenopathy was found incidentally while she was being checked for other abdominal pains. In February pt was seeing blood in her stools and had to be hospitalized due to severe blood loss. Pt's 04/20 Colonoscopy revealed that she did have internal hemorrhoids that were bleeding. Dr. Alessandra Bevels did not think that the pt needed an endoscopy because they believe that her internal hemorrhoid is the sole source of her GI bleeding. She also has a history of menorrhagia, for which she has had to have multiple blood transfusions. Dr. Denman George started her on Megace in July, her dosage has since been increased. Pt's pelvic region is sensitive to touch and the pain is constant. She reports that the pain is centralized in her hip and does not feel like it's going down her leg. Pt denies right leg swelling or injuries to the right side/leg. It does not hurt when she lifts her leg up, down or out. She does note mild pain when she moves her leg in, towards the center of her body. Heat helps ease the pain, so she often walks around with a heating pad and intercourse makes the pain worse. Dr. [name] has given her no indication of what is causing her pain. Pt also experiences non-radiating lower back pain that could be related. Pt had a chest CT in 11/2018 due to chest pains after she was moving heavy objects around but nothing of note was found. Her chest pains have since resolved. Pt reports some hematuria but denies any history of UTIs. She is  planning on seeing a Urologist soon. She has no family history of any autoimmune conditions but does have a large family history of cancer.   Pt can not take Ibuprofen due to bleeding concerns so she has had to take several different pain medications. She was last on Vicodin which helped but she has since run out. Dr. Denman George was trying to discern if most of her pain was due to her menstrual cramps and wanted to do an endometrial ablation but was told to wait until more is discovered about pt's inguinal lymphadenopathy. Pt has been taking Ferrous Sulfate 4x per day and Metamucil. Pt has no issues passing her bowels. Pt was given Amoxicillin & another antibiotic for about 7 days. She was given no reason why they were giving her antibiotics and it did not help her pain.   She reports that she has had a low-grade fever since Thursday and has had very bad night sweats for the past month. The night sweats appear to be due to the increase in her Megace dosing. She has not had any abnormal vaginal discharge or any boils or bumps in her right pelvic region. Pt has not noticed any lymph node growth or discomfort in the cervical region. She has been helping her children with their at-home schooling and has some new stress due to her father's medical concerns.   Of note prior to the patient's visit today, pt has had Pelvis MRI ([id number]) completed on 02/22/2019 with results revealing "Persistent right  inguinal and iliac adenopathy, previously biopsied and within 1-2 mm of size when compared to 09/14/2018. Significance uncertain given unilateral enlargement, unusual for pelvic nodes with reactive changes. Imaging appearance remains nonspecific but again raises the question of lymphoproliferative disorder. Correlate with previous biopsy results with imaging and clinical follow-up as warranted. Mass on ultrasound likely represented pelvic sidewall/external iliac nodal enlargement on the right."   Pt has had US Pelvic  ([id number]) completed on 12/16/2018 with results revealing "1. RIGHT adnexal mass, discrete from the RIGHT ovary is 1.9 centimeters and avascular on Doppler evaluation. This likely represents enlarged RIGHT external iliac lymph node as seen on previous CT exams of the abdomen and pelvis 09/14/2018 and 08/31/2018. 2. Given the persistence of RIGHT iliac lymph node enlargement, consider biopsy of RIGHT inguinal lymph node, seen on prior CT exam. 3. Normal endometrial thickness. If bleeding remains unresponsive to hormonal or medical therapy, sonohysterogram should be considered for focal lesion work-up. (Ref: Radiological Reasoning: Algorithmic  Workup of Abnormal Vaginal Bleeding with Endovaginal Sonography and Sonohysterography. AJR 2008; 734:[id number])."  Pt has had Lymph node biopsy (right groin) ([id number]) completed on 09/29/2018 with results revealing "LYMPHOID HYPERPLASIA."  Most recent lab results (02/10/2019) of CBC w/diff is as follows: all values are WNL except for RDW at 18.3, PLTs at 457K. 12/08/2018 BMP is as follows: all values are WNL except for CO2 at 18.   On review of systems, pt reports fevers, night sweats, pelvic pain and denies chills, SOB, coughing, runny nose, bloody stools, new lumps/bumps, abdominal pain, leg swelling, calf pain and any other symptoms.   On PMHx the pt reports menorrhagia, lower GI bleeding On Family Hx the pt reports her great aunt passed from liver cancer, her cousin currently has lung cancer, her mother had a mass in her breast (Removed)  INTERVAL HISTORY  Leah Baker is a wonderful 40 y.o. female who is here today for evaluation and management of pelvic lymphadenopathy and iron deficiency. The patient's last visit with [place] was on 02/28/2019. The pt reports that she is doing well overall.  The pt reports that she has been doing well overall. She had the lymph node biopsy that showed no cancer and was just reactive. The pt followed up with GI for her  hemorrhoids as these were inflamed and what she believes to be the cause for the reactive lymph nodes. The pt went to her GI who diagnosed her with Hemachromatosis as she had the mutant gene and instructed her to halt all iron immediately. The last iron supplementation for the pt was in September 2021. The pt notes she then became iron deficient. She is currently still iron deficient and is craving ice, cold intolerant, and very fatigued. She is seeking clarity for what exactly she needs to do regarding iron supplementation and her newly diagnosed hemochromatosis. The pt still experiences some rectal bleeding and will be seeing a doctor for this per her PCP's referral.    The pt notes she started Megestrol and recently ran out in December and could not get a refill. This caused her to bleed for three weeks as she was off the medication for one whole week. She takes two pills TID, a total of 120 mg daily. The pt is set up for the ablation and is hoping to get off of this following that. She still has the GI bleeding that is due to her massive internal hemorrhoid. The pt notes they had a plan to work on this, but paused due  to the ly,ph node finding. She recently went back and the doctor just told her to change her diet. The pt notes she has to wear pads just when going outside because she does not know when the bleeding will start. She notes much frustration with this entire process and the lack of resolution of this issue.   The pt notes her last blood transfusion and IV Iron was December 2020. The pt denies any history of liver function abnormalities.  Lab results today 07/30/2020 of CBC w/diff and CMP is as follows: all values are WNL except for Hgb of 10.6, HCT of 33.6, Plt of 513K, Creatinine of 1.01, Chloride of 107, CO2 of 18. 07/30/2020 Iron of 61, Sat of 12, Ferritin of 9.  On review of systems, pt reports ice cravings, fatigue, GI bleeding, blood in urine, cold intolerance, night sweats and denies  abdominal pain, back pain, leg swelling, fevers, chills, and any other symptoms.  MEDICAL HISTORY:  Past Medical History:  Diagnosis Date  . Acute costochondritis 11/08/2018   anterior chest wall pain  per pt pcp note in epic on 11-10-2018  . Anemia    Phreesia 06/08/2020  . Anxiety   . Costochondritis, acute 11/10/2018  . Depression   . Depression    Phreesia 06/08/2020  . Hemorrhoids   . History of lower GI bleeding 08/31/2018   s/p  colonoscopy 09-01-2018,  hemorrhoids  . IDA (iron deficiency anemia)   . Inguinal lymphadenopathy    CT 09-14-2018  right side, followed by pcp  . PONV (postoperative nausea and vomiting)    only happened once  . Wears glasses     SURGICAL HISTORY: Past Surgical History:  Procedure Laterality Date  . BIOPSY  09/01/2018   Procedure: BIOPSY;  Surgeon: Otis Brace, MD;  Location: [facility];  Service: Gastroenterology;;  . CESAREAN SECTION  01/15/2002   @[facility]   . CESAREAN SECTION  03/17/2011   Procedure: CESAREAN SECTION;  Surgeon: Osborne Oman, MD;  Location: [facility];  Service: Gynecology;  Laterality: N/A;  . COLONOSCOPY WITH PROPOFOL N/A 09/01/2018   Procedure: COLONOSCOPY WITH PROPOFOL;  Surgeon: Otis Brace, MD;  Location: [facility];  Service: Gastroenterology;  Laterality: N/A;  . HYSTEROSCOPY WITH D & C  06-21-2009  dr constant @[facility]    w/  REMOVAL IUD   . INGUINAL LYMPH NODE BIOPSY Right 04/12/2020   Procedure: RIGHT INGUINAL LYMPH NODE EXCISIONAL BIOPSY;  Surgeon: Stark Klein, MD;  Location: [facility];  Service: General;  Laterality: Right;  . IUD REMOVAL  06/21/2009  . KNEE ARTHROSCOPY WITH ANTERIOR CRUCIATE LIGAMENT (ACL) REPAIR Left 09-08-2013   dr [name]  @[facility]    w/  meniscal repair  . MULTIPLE TOOTH EXTRACTIONS    . RECTAL EXAM UNDER ANESTHESIA N/A 11/18/2018   Procedure: ANORECTAL EXAM UNDER ANESTHESIA;  Surgeon: Ileana Roup, MD;  Location: [facility];  Service: General;  Laterality: N/A;  . TUBAL  LIGATION Bilateral 2012    SOCIAL HISTORY: Social History   Socioeconomic History  . Marital status: Significant Other    Spouse name: Not on file  . Number of children: Not on file  . Years of education: Not on file  . Highest education level: Not on file  Occupational History  . Not on file  Tobacco Use  . Smoking status: Former Smoker    Years: 5.00    Types: Cigarettes    Quit date: 08/22/2008    Years since quitting: 11.9  . Smokeless tobacco:  Never Used  Vaping Use  . Vaping Use: Never used  Substance and Sexual Activity  . Alcohol use: Yes    Comment: occasionally  . Drug use: Not Currently    Comment: per pt younger in college marijuana, none since  . Sexual activity: Yes    Birth control/protection: Surgical  Other Topics Concern  . Not on file  Social History Narrative  . Not on file   Social Determinants of Health   Financial Resource Strain: Not on file  Food Insecurity: Not on file  Transportation Needs: Not on file  Physical Activity: Not on file  Stress: Not on file  Social Connections: Not on file  Intimate Partner Violence: Not on file    FAMILY HISTORY: Family History  Problem Relation Age of Onset  . Cancer Mother   . Kidney failure Mother   . Healthy Father     ALLERGIES:  is allergic to latex.  MEDICATIONS:  Current Outpatient Medications  Medication Sig Dispense Refill  . acetaminophen (TYLENOL) 500 MG tablet Take 1 tablet (500 mg total) by mouth every 6 (six) hours as needed. 30 tablet 0  . megestrol (MEGACE) 40 MG tablet Take 2 tablets (80 mg total) by mouth 2 (two) times daily. 120 tablet 2  . Menthol-Camphor (TIGER BALM ARTHRITIS RUB EX) Apply 1 application topically daily as needed (knee pain).     No current facility-administered medications for this visit.   Facility-Administered Medications Ordered in Other Visits  Medication Dose Route Frequency Provider Last Rate Last Admin  . bupivacaine liposome (EXPAREL) 1.3 %  injection 266 mg  20 mL Infiltration Once Stark Klein, MD        REVIEW OF SYSTEMS:   10 Point review of Systems was done is negative except as noted above.  PHYSICAL EXAMINATION: ECOG PERFORMANCE STATUS: 2 - Symptomatic, <50% confined to bed  . Vitals:   08/03/20 0853  BP: 126/86  Pulse: 79  Resp: 20  Temp: 97.7 F (36.5 C)  SpO2: 100%   Filed Weights   08/03/20 0853  Weight: 177 lb 3.2 oz (80.4 kg)   .Body mass index is 31.39 kg/m.   GENERAL:alert, in no acute distress and comfortable SKIN: no acute rashes, no significant lesions EYES: conjunctiva are pink and non-injected, sclera anicteric OROPHARYNX: MMM, no exudates, no oropharyngeal erythema or ulceration NECK: supple, no JVD LYMPH:  no palpable lymphadenopathy in the cervical, axillary or inguinal regions LUNGS: clear to auscultation b/l with normal respiratory effort HEART: regular rate & rhythm ABDOMEN:  normoactive bowel sounds , non tender, not distended. Extremity: no pedal edema PSYCH: alert & oriented x 3 with fluent speech NEURO: no focal motor/sensory deficits  LABORATORY DATA:  I have reviewed the data as listed  . CBC Latest Ref Rng & Units 07/30/2020 06/28/2020 04/05/2020  WBC 3.4 - 10.8 x10E3/uL 6.8 7.9 5.8  Hemoglobin 11.1 - 15.9 g/dL 10.6(L) 11.7 10.7(L)  Hematocrit 34.0 - 46.6 % 33.6(L) 35.3 35.1(L)  Platelets 150 - 450 x10E3/uL 513(H) 467(H) 500(H)    . CMP Latest Ref Rng & Units 07/30/2020 03/28/2020 11/13/2019  Glucose 65 - 99 mg/dL 95 91 98  BUN 6 - 24 mg/dL 12 13 8   Creatinine 0.57 - 1.00 mg/dL 1.01(H) 1.06(H) 0.85  Sodium 134 - 144 mmol/L 139 136 138  Potassium 3.5 - 5.2 mmol/L 4.4 4.2 4.3  Chloride 96 - 106 mmol/L 107(H) 105 109  CO2 20 - 29 mmol/L 18(L) 18(L) 21(L)  Calcium 8.7 - 10.2  mg/dL 9.5 9.8 9.6  Total Protein 6.0 - 8.5 g/dL 7.4 - -  Total Bilirubin 0.0 - 1.2 mg/dL 0.4 - -  Alkaline Phos 44 - 121 IU/L 66 - -  AST 0 - 40 IU/L 22 - -  ALT 0 - 32 IU/L 19 - -   Lab  Results  Component Value Date   IRON 61 07/30/2020   TIBC 514 (H) 07/30/2020   IRONPCTSAT 12 (L) 07/30/2020   (Iron and TIBC)  Lab Results  Component Value Date   FERRITIN 9 (L) 07/30/2020    09/29/2018 Lymph node biopsy    04/12/2020 Surgical Pathology Lymph Node Biopsy  A. LYMPH NODE, RIGHT INGUINAL, EXCISION:  -Florid lymphoid hyperplasia  -See comment  COMMENT:  The sections show preservation of the lymph nodal architecture. The  lymphoid tissue displays a large number of variably sized lymphoid  follicles with reactive appearing germinal centers. Scattered lymphoid  nodules with features of progressive transformation of germinal centers  are seen. The interfollicular zones are variably expanded by small  lymphocytes, and variable numbers of large centroblastic lymphoid appearing cells in addition to eosinophils in some areas. No granulomata or metastatic malignancy is identified. To further evaluate this process, flow cytometric analysis was performed [facility] [id number]) and  failed to show any monoclonal B-cell population or abnormal T-cell phenotype. In addition, a battery of immunohistochemical stains were  performed including BCL-2, BCL6, CD10, CD20, CD21, CD23, PAX 5, cyclin  D1, CD3, CD5, CD30, CD15, EMA in addition to EBV in situ hybridization  with appropriate controls. There is a mixture of T and B cells in their  respective compartments. B-cell markers highlight the numerous lymphoid  follicles throughout which also stain positively for CD10, and BCL6 and  negatively for BCL-2. CD21 and CD23 highlight the abundant follicular  dendritic networks within the lymphoid follicles. The lymphoid  follicles are surrounded by abundant T cells as seen with CD3 and CD5.  No significant cyclin D1 or EBV positivity identified. CD30 highlights  numerous large lymphoid cells within the interfollicular zones and  germinal centers but with no significant CD15 or EMA positivity  identified.  The overall features are not considered specific but  consistent with reactive lymphoid hyperplasia. There is no definite or diagnostic evidence of a lymphoproliferative process. Clinical correlation is recommended.   RADIOGRAPHIC STUDIES: I have personally reviewed the radiological images as listed and agreed with the findings in the report. No results found.  ASSESSMENT & PLAN:   40 yo with   1) Rt inguiinal and iliac lymphadenopathy -- reactive lymphoid hyperplasia 2) Patient reported possible Hemochromatosis ?carrier state 3) Iron deficiency anemia likely from hemorrhoidal bleeding 4) Thrombocytosis - likely reactive from iron deficiency. PLAN: -Discussed patient's labwork today, 07/30/2020; Iron Sat and Ferritin very low. Mild anemia but Hgb not very low.  -Advised pt that there is nothing shown on biopsy of lymph nodes that is actionable for [place] at this time. The results showed reactive and not cancerous results.  -Discussed hemochromatosis and mutant genes. Advised pt that most common people have one of the two mutants and are just carriers and have the carrier state. Advised that if someone has the carrier state, then they typically absorb more iron than one in the general population. -Advised pt it will be useful to know which type of gene the pt has. Will contact [facility] and obtain records. -Discussed balance of iron absorption versus bleeding.  -Advised pt that lymph nodes are not a fast  moving process and were unchanged in size for over a year in comparison of the pt's two MRIs. -Advised pt that typically wait 6-8 weeks between IV Iron and MRI / blood work. The pt notes she got this done 24 hours following her infusion and believes that could be why the doctor instructed her to halt iron immediately. -Recommended pt start Iron Polysaccharide BID due to obvious iron deficiency until the bleeding is addressed. Use stool softener for constipation. -Goal Ferritin is around 50-100.  If she has carrier state, then levels close to 50 is just fine.  -Advised pt to recheck labs in 3-4 months. Pt wishes to return to clinic and not check w PCP. -Continue f/u w OBGYN regarding lymph nodes, but it is just reactive. -Advised pt to address GI bleeding with Dr. Dema Severin for potential surgery to control bleeding. Control hemorrhoids and endometrial bleeding. -Advised pt her blood counts are good to get the ablation.  -Will see back in 4 months with labs.   2) Iron deficiency anemia  Plan -continue po iron    FOLLOW UP: Return to clinic with Dr. Irene Limbo with labs and 4 months    All of the patients questions were answered with apparent satisfaction. The patient knows to call the clinic with any problems, questions or concerns.  The total time spent in the appointment was 30 minutes and more than 50% was on counseling and direct patient cares.    Sullivan Lone MD [place] AAHIVMS [facility] Hematology/Oncology Physician [facility]  (Office):       [phone] (Work cell):  [phone] (Fax):           [phone]  08/02/2020 9:31 AM  I, Reinaldo Raddle, am acting as scribe for Dr. Sullivan Lone, MD.    .I have reviewed the above documentation for accuracy and completeness, and I agree with the above. Brunetta Genera MD

## 2020-08-03 ENCOUNTER — Inpatient Hospital Stay: Payer: Medicaid Other | Attending: Hematology | Admitting: Hematology

## 2020-08-03 ENCOUNTER — Other Ambulatory Visit: Payer: Self-pay

## 2020-08-03 ENCOUNTER — Telehealth: Payer: Self-pay | Admitting: *Deleted

## 2020-08-03 VITALS — BP 126/86 | HR 79 | Temp 97.7°F | Resp 20 | Ht 63.0 in | Wt 177.2 lb

## 2020-08-03 DIAGNOSIS — D509 Iron deficiency anemia, unspecified: Secondary | ICD-10-CM | POA: Insufficient documentation

## 2020-08-03 DIAGNOSIS — D75838 Other thrombocytosis: Secondary | ICD-10-CM | POA: Diagnosis not present

## 2020-08-03 DIAGNOSIS — R599 Enlarged lymph nodes, unspecified: Secondary | ICD-10-CM | POA: Diagnosis not present

## 2020-08-03 DIAGNOSIS — R591 Generalized enlarged lymph nodes: Secondary | ICD-10-CM | POA: Insufficient documentation

## 2020-08-03 NOTE — Telephone Encounter (Signed)
Per Dr. Grier Mitts direction - contacted [facility]  [phone], Dr. Alessandra Bevels. Left Voice Mail w/Medical Records requesting patient records for colonoscopy and hemachromatosis genetic testing.  Left Desk # [phone] for call back and Desk fax# [phone]  for records.

## 2020-08-03 NOTE — Patient Instructions (Signed)
Thank you for choosing [facility] to provide your oncology and hematology care.   Should you have questions after your visit to the [facility] ([facility]), please contact this office at [phone] between 8:30 AM and 4:30 PM.  Voice mails left after 4:00 PM may not be returned until the following business day.  Calls received after 4:30 PM will be answered by an off-site Nurse Triage Line.    Prescription Refills:  Please have your pharmacy contact us directly for most prescription requests.  Contact the office directly for refills of narcotics (pain medications). Allow 48-72 hours for refills.  Appointments: Please contact the [facility] scheduling department [phone] for questions regarding [facility] appointment scheduling.  Contact the schedulers with any scheduling changes so that your appointment can be rescheduled in a timely manner.   Central Scheduling for [facility] [phone] - Call to schedule procedures such as PET scans, CT scans, MRI, Ultrasound, etc.  To afford each patient quality time with our providers, please arrive 30 minutes before your scheduled appointment time.  If you arrive late for your appointment, you may be asked to reschedule.  We strive to give you quality time with our providers, and arriving late affects you and other patients whose appointments are after yours. If you are a no show for multiple scheduled visits, you may be dismissed from the clinic at the providers discretion.     Resources: [facility] Social Workers [phone] for additional information on assistance programs or assistance connecting with community support programs   [place] DSS  [phone]: Information regarding food stamps, Medicaid, and utility assistance [place] [employer] [place] [phone]   [place] [employer]'s shared-ride transportation service for eligible riders who have a disability that prevents them from riding the fixed route bus.   Medicare  Rights Center 800-333-4114 Helps people with Medicare understand their rights and benefits, navigate the Medicare system, and secure the quality healthcare they deserve [employer] 800-227-2345 Assists patients locate various types of support and financial assistance Cancer Care: 1-800-813-HOPE (4673) Provides financial assistance, online support groups, medication/co-pay assistance.   Transportation Assistance for appointments at [facility]: Transportation Coordinator [phone]  Again, thank you for choosing [facility] for your care.       

## 2020-08-06 NOTE — H&P (Signed)
Leah Baker is an 40 y.o. [phone] female.   Chief Complaint: abnormal uterine bleeding HPI: has h/o BTL and C-section x 2, has needed Megace for control. Continues to have bleeding. She has needed blood transfusion in the past.   Past Medical History:  Diagnosis Date  . Acute costochondritis 11/08/2018   anterior chest wall pain  per pt pcp note in epic on 11-10-2018  . Anemia    Phreesia 06/08/2020  . Anxiety   . Costochondritis, acute 11/10/2018  . Depression   . Depression    Phreesia 06/08/2020  . Hemorrhoids   . History of lower GI bleeding 08/31/2018   s/p  colonoscopy 09-01-2018,  hemorrhoids  . IDA (iron deficiency anemia)   . Inguinal lymphadenopathy    CT 09-14-2018  right side, followed by pcp  . PONV (postoperative nausea and vomiting)    only happened once  . Wears glasses     Past Surgical History:  Procedure Laterality Date  . BIOPSY  09/01/2018   Procedure: BIOPSY;  Surgeon: Otis Brace, MD;  Location: [facility];  Service: Gastroenterology;;  . CESAREAN SECTION  01/15/2002   @[facility]   . CESAREAN SECTION  03/17/2011   Procedure: CESAREAN SECTION;  Surgeon: Osborne Oman, MD;  Location: [facility];  Service: Gynecology;  Laterality: N/A;  . COLONOSCOPY WITH PROPOFOL N/A 09/01/2018   Procedure: COLONOSCOPY WITH PROPOFOL;  Surgeon: Otis Brace, MD;  Location: [facility];  Service: Gastroenterology;  Laterality: N/A;  . HYSTEROSCOPY WITH D & C  06-21-2009  dr constant @[facility]    w/  REMOVAL IUD   . INGUINAL LYMPH NODE BIOPSY Right 04/12/2020   Procedure: RIGHT INGUINAL LYMPH NODE EXCISIONAL BIOPSY;  Surgeon: Stark Klein, MD;  Location: [facility];  Service: General;  Laterality: Right;  . IUD REMOVAL  06/21/2009  . KNEE ARTHROSCOPY WITH ANTERIOR CRUCIATE LIGAMENT (ACL) REPAIR Left 09-08-2013   dr [name]  @[facility]    w/  meniscal repair  . MULTIPLE TOOTH EXTRACTIONS    . RECTAL EXAM UNDER ANESTHESIA N/A 11/18/2018   Procedure: ANORECTAL EXAM UNDER ANESTHESIA;   Surgeon: Ileana Roup, MD;  Location: [facility];  Service: General;  Laterality: N/A;  . TUBAL LIGATION Bilateral 2012    Family History  Problem Relation Age of Onset  . Cancer Mother   . Kidney failure Mother   . Healthy Father    Social History:  reports that she quit smoking about 11 years ago. Her smoking use included cigarettes. She quit after 5.00 years of use. She has never used smokeless tobacco. She reports current alcohol use. She reports previous drug use.  Allergies:  Allergies  Allergen Reactions  . Latex Itching and Rash    No medications prior to admission.    A comprehensive review of systems was negative.  There were no vitals taken for this visit. General appearance: alert, cooperative and appears stated age Head: Normocephalic, without obvious abnormality, atraumatic Neck: supple, symmetrical, trachea midline Lungs: normal effort Heart: regular rate and rhythm Abdomen: soft, non-tender; bowel sounds normal; no masses,  no organomegaly Extremities: Homans sign is negative, no sign of DVT Skin: Skin color, texture, turgor normal. No rashes or lesions Neurologic: Grossly normal   Lab Results  Component Value Date   WBC 6.8 07/30/2020   HGB 10.6 (L) 07/30/2020   HCT 33.6 (L) 07/30/2020   MCV 84 07/30/2020   PLT 513 (H) 07/30/2020   Lab Results  Component Value Date   PREGTESTUR NEGATIVE 04/12/2020  HCG <5.0 05/14/2019     Assessment/Plan Active Problems:   Iron deficiency anemia   Menorrhagia  For HTA Risks include but are not limited to bleeding, infection, injury to surrounding structures, including bowel, bladder and ureters, blood clots, and death.  Likelihood of success is high.    [name] 08/06/2020, 1:40 PM

## 2020-08-07 ENCOUNTER — Encounter: Payer: Self-pay | Admitting: Critical Care Medicine

## 2020-08-07 DIAGNOSIS — Z148 Genetic carrier of other disease: Secondary | ICD-10-CM | POA: Insufficient documentation

## 2020-08-16 ENCOUNTER — Other Ambulatory Visit: Payer: Self-pay

## 2020-08-16 ENCOUNTER — Encounter (HOSPITAL_BASED_OUTPATIENT_CLINIC_OR_DEPARTMENT_OTHER): Payer: Self-pay | Admitting: Family Medicine

## 2020-08-16 NOTE — Progress Notes (Signed)
Spoke w/ via phone for pre-op interview---pt Lab needs dos----   Cbc urine poct            Lab results------none COVID test ------08-20-2020 800 am Arrive at -------950 am 08-22-2020 NPO after MN NO Solid Food.  Clear liquids from MN until---850 am then npo Med rec completed Medications to take morning of surgery -----megace Diabetic medication -----n/a Patient instructed to bring photo id and insurance card day of surgery Patient aware to have Driver (ride ) / caregiver  Sister Leah Baker will stay   for 24 hours after surgery  Patient Special Instructions -----none Pre-Op special Istructions -----none Patient verbalized understanding of instructions that were given at this phone interview. Patient denies shortness of breath, chest pain, fever, cough at this phone interview.

## 2020-08-20 ENCOUNTER — Other Ambulatory Visit (HOSPITAL_COMMUNITY)
Admission: RE | Admit: 2020-08-20 | Discharge: 2020-08-20 | Disposition: A | Discharge status: Home / Self Care | Payer: Medicaid Other | Source: Ambulatory Visit | Attending: Family Medicine | Admitting: Family Medicine

## 2020-08-20 DIAGNOSIS — Z01812 Encounter for preprocedural laboratory examination: Secondary | ICD-10-CM | POA: Insufficient documentation

## 2020-08-20 DIAGNOSIS — Z20822 Contact with and (suspected) exposure to covid-19: Secondary | ICD-10-CM | POA: Diagnosis not present

## 2020-08-20 LAB — SARS CORONAVIRUS 2 (TAT 6-24 HRS): SARS Coronavirus 2: NEGATIVE

## 2020-08-22 ENCOUNTER — Ambulatory Visit (HOSPITAL_BASED_OUTPATIENT_CLINIC_OR_DEPARTMENT_OTHER): Payer: Medicaid Other | Admitting: Certified Registered"

## 2020-08-22 ENCOUNTER — Encounter (HOSPITAL_BASED_OUTPATIENT_CLINIC_OR_DEPARTMENT_OTHER): Payer: Self-pay | Admitting: Family Medicine

## 2020-08-22 ENCOUNTER — Other Ambulatory Visit: Payer: Self-pay

## 2020-08-22 ENCOUNTER — Encounter (HOSPITAL_BASED_OUTPATIENT_CLINIC_OR_DEPARTMENT_OTHER)
Admission: RE | Disposition: A | Discharge status: Home / Self Care | Payer: Self-pay | Source: Home / Self Care | Attending: Family Medicine

## 2020-08-22 ENCOUNTER — Ambulatory Visit (HOSPITAL_BASED_OUTPATIENT_CLINIC_OR_DEPARTMENT_OTHER)
Admission: RE | Admit: 2020-08-22 | Discharge: 2020-08-22 | Disposition: A | Discharge status: Home / Self Care | Payer: Medicaid Other | Attending: Family Medicine | Admitting: Family Medicine

## 2020-08-22 DIAGNOSIS — Z91040 Latex allergy status: Secondary | ICD-10-CM | POA: Diagnosis not present

## 2020-08-22 DIAGNOSIS — Z87891 Personal history of nicotine dependence: Secondary | ICD-10-CM | POA: Insufficient documentation

## 2020-08-22 DIAGNOSIS — Y813 Surgical instruments, materials and general- and plastic-surgery devices (including sutures) associated with adverse incidents: Secondary | ICD-10-CM | POA: Diagnosis not present

## 2020-08-22 DIAGNOSIS — N9981 Other intraoperative complications of genitourinary system: Secondary | ICD-10-CM | POA: Diagnosis not present

## 2020-08-22 DIAGNOSIS — X58XXXA Exposure to other specified factors, initial encounter: Secondary | ICD-10-CM | POA: Insufficient documentation

## 2020-08-22 DIAGNOSIS — D509 Iron deficiency anemia, unspecified: Secondary | ICD-10-CM | POA: Diagnosis present

## 2020-08-22 DIAGNOSIS — Z809 Family history of malignant neoplasm, unspecified: Secondary | ICD-10-CM | POA: Insufficient documentation

## 2020-08-22 DIAGNOSIS — Z841 Family history of disorders of kidney and ureter: Secondary | ICD-10-CM | POA: Diagnosis not present

## 2020-08-22 DIAGNOSIS — N920 Excessive and frequent menstruation with regular cycle: Secondary | ICD-10-CM | POA: Diagnosis not present

## 2020-08-22 DIAGNOSIS — S3763XA Laceration of uterus, initial encounter: Secondary | ICD-10-CM | POA: Diagnosis not present

## 2020-08-22 DIAGNOSIS — N9971 Accidental puncture and laceration of a genitourinary system organ or structure during a genitourinary system procedure: Secondary | ICD-10-CM | POA: Insufficient documentation

## 2020-08-22 DIAGNOSIS — N881 Old laceration of cervix uteri: Secondary | ICD-10-CM | POA: Diagnosis not present

## 2020-08-22 DIAGNOSIS — F418 Other specified anxiety disorders: Secondary | ICD-10-CM | POA: Diagnosis not present

## 2020-08-22 HISTORY — DX: Personal history of other medical treatment: Z92.89

## 2020-08-22 HISTORY — DX: Abnormal uterine and vaginal bleeding, unspecified: N93.9

## 2020-08-22 HISTORY — PX: DILITATION & CURRETTAGE/HYSTROSCOPY WITH HYDROTHERMAL ABLATION: SHX5570

## 2020-08-22 HISTORY — DX: Melena: K92.1

## 2020-08-22 LAB — POCT PREGNANCY, URINE: Preg Test, Ur: NEGATIVE

## 2020-08-22 LAB — CBC
HCT: 34.7 % — ABNORMAL LOW (ref 36.0–46.0)
Hemoglobin: 11 g/dL — ABNORMAL LOW (ref 12.0–15.0)
MCH: 27.3 pg (ref 26.0–34.0)
MCHC: 31.7 g/dL (ref 30.0–36.0)
MCV: 86.1 fL (ref 80.0–100.0)
Platelets: 486 10*3/uL — ABNORMAL HIGH (ref 150–400)
RBC: 4.03 MIL/uL (ref 3.87–5.11)
RDW: 14.9 % (ref 11.5–15.5)
WBC: 6.1 10*3/uL (ref 4.0–10.5)
nRBC: 0 % (ref 0.0–0.2)

## 2020-08-22 SURGERY — DILATATION & CURETTAGE/HYSTEROSCOPY WITH HYDROTHERMAL ABLATION
Anesthesia: General | Site: Uterus

## 2020-08-22 MED ORDER — FENTANYL CITRATE (PF) 100 MCG/2ML IJ SOLN
25.0000 ug | INTRAMUSCULAR | Status: DC | PRN
  Administered 2020-08-22 (×2): 25 ug via INTRAVENOUS

## 2020-08-22 MED ORDER — AMISULPRIDE (ANTIEMETIC) 5 MG/2ML IV SOLN: 10.0000 mg | Freq: Once | INTRAVENOUS | Status: DC | PRN

## 2020-08-22 MED ORDER — KETOROLAC TROMETHAMINE 30 MG/ML IJ SOLN
INTRAMUSCULAR | Status: AC
  Filled 2020-08-22: qty 1

## 2020-08-22 MED ORDER — LIDOCAINE HCL 1 % IJ SOLN
INTRAMUSCULAR | Status: DC | PRN
  Administered 2020-08-22: 20 mL

## 2020-08-22 MED ORDER — LACTATED RINGERS IV SOLN: INTRAVENOUS | Status: DC

## 2020-08-22 MED ORDER — MIDAZOLAM HCL 2 MG/2ML IJ SOLN
INTRAMUSCULAR | Status: DC | PRN
  Administered 2020-08-22: 2 mg via INTRAVENOUS

## 2020-08-22 MED ORDER — ONDANSETRON HCL 4 MG/2ML IJ SOLN
INTRAMUSCULAR | Status: AC
  Filled 2020-08-22: qty 2

## 2020-08-22 MED ORDER — TRAMADOL HCL 50 MG PO TABS: 50.0000 mg | ORAL_TABLET | Freq: Four times a day (QID) | ORAL | 0 refills | Status: AC | PRN

## 2020-08-22 MED ORDER — DEXAMETHASONE SODIUM PHOSPHATE 10 MG/ML IJ SOLN
INTRAMUSCULAR | Status: DC | PRN
  Administered 2020-08-22: 5 mg via INTRAVENOUS

## 2020-08-22 MED ORDER — ONDANSETRON HCL 4 MG/2ML IJ SOLN
INTRAMUSCULAR | Status: DC | PRN
  Administered 2020-08-22: 4 mg via INTRAVENOUS

## 2020-08-22 MED ORDER — OXYCODONE HCL 5 MG PO TABS
ORAL_TABLET | ORAL | Status: AC
  Filled 2020-08-22: qty 1

## 2020-08-22 MED ORDER — SODIUM CHLORIDE 0.9 % IR SOLN
Status: DC | PRN
  Administered 2020-08-22 (×2): 3000 mL

## 2020-08-22 MED ORDER — LIDOCAINE 2% (20 MG/ML) 5 ML SYRINGE
INTRAMUSCULAR | Status: DC | PRN
  Administered 2020-08-22: 60 mg via INTRAVENOUS

## 2020-08-22 MED ORDER — POVIDONE-IODINE 10 % EX SWAB: 2.0000 "application " | Freq: Once | CUTANEOUS | Status: DC

## 2020-08-22 MED ORDER — GABAPENTIN 300 MG PO CAPS
300.0000 mg | ORAL_CAPSULE | ORAL | Status: AC
  Administered 2020-08-22: 300 mg via ORAL

## 2020-08-22 MED ORDER — LIDOCAINE 2% (20 MG/ML) 5 ML SYRINGE
INTRAMUSCULAR | Status: AC
  Filled 2020-08-22: qty 5

## 2020-08-22 MED ORDER — MIDAZOLAM HCL 2 MG/2ML IJ SOLN
INTRAMUSCULAR | Status: AC
  Filled 2020-08-22: qty 2

## 2020-08-22 MED ORDER — PROMETHAZINE HCL 25 MG/ML IJ SOLN: 6.2500 mg | INTRAMUSCULAR | Status: DC | PRN

## 2020-08-22 MED ORDER — FENTANYL CITRATE (PF) 100 MCG/2ML IJ SOLN
INTRAMUSCULAR | Status: AC
  Filled 2020-08-22: qty 2

## 2020-08-22 MED ORDER — CELECOXIB 200 MG PO CAPS: 400.0000 mg | ORAL_CAPSULE | ORAL | Status: DC

## 2020-08-22 MED ORDER — DEXAMETHASONE SODIUM PHOSPHATE 10 MG/ML IJ SOLN
INTRAMUSCULAR | Status: AC
  Filled 2020-08-22: qty 1

## 2020-08-22 MED ORDER — CELECOXIB 200 MG PO CAPS
ORAL_CAPSULE | ORAL | Status: AC
  Filled 2020-08-22: qty 2

## 2020-08-22 MED ORDER — KETOROLAC TROMETHAMINE 30 MG/ML IJ SOLN
INTRAMUSCULAR | Status: DC | PRN
  Administered 2020-08-22: 30 mg via INTRAVENOUS

## 2020-08-22 MED ORDER — FENTANYL CITRATE (PF) 100 MCG/2ML IJ SOLN
INTRAMUSCULAR | Status: DC | PRN
  Administered 2020-08-22: 25 ug via INTRAVENOUS
  Administered 2020-08-22: 50 ug via INTRAVENOUS
  Administered 2020-08-22: 25 ug via INTRAVENOUS

## 2020-08-22 MED ORDER — ACETAMINOPHEN 500 MG PO TABS
ORAL_TABLET | ORAL | Status: AC
  Filled 2020-08-22: qty 2

## 2020-08-22 MED ORDER — ACETAMINOPHEN 500 MG PO TABS
1000.0000 mg | ORAL_TABLET | ORAL | Status: AC
  Administered 2020-08-22: 1000 mg via ORAL

## 2020-08-22 MED ORDER — OXYCODONE HCL 5 MG PO TABS
5.0000 mg | ORAL_TABLET | Freq: Once | ORAL | Status: AC
Start: 2020-08-22 — End: 2020-08-22
  Administered 2020-08-22: 5 mg via ORAL

## 2020-08-22 MED ORDER — PROPOFOL 10 MG/ML IV BOLUS
INTRAVENOUS | Status: DC | PRN
  Administered 2020-08-22: 140 mg via INTRAVENOUS
  Administered 2020-08-22: 30 mg via INTRAVENOUS

## 2020-08-22 MED ORDER — GABAPENTIN 300 MG PO CAPS
ORAL_CAPSULE | ORAL | Status: AC
  Filled 2020-08-22: qty 1

## 2020-08-22 SURGICAL SUPPLY — 19 items
CANISTER SUCT 3000ML PPV (MISCELLANEOUS) ×2 IMPLANT
CATH ROBINSON RED A/P 16FR (CATHETERS) ×2 IMPLANT
COVER WAND RF STERILE (DRAPES) ×2 IMPLANT
DILATOR CANAL MILEX (MISCELLANEOUS) ×1 IMPLANT
GAUZE 4X4 16PLY RFD (DISPOSABLE) ×2 IMPLANT
GLOVE SURG LTX SZ7 (GLOVE) ×2 IMPLANT
GLOVE SURG UNDER POLY LF SZ7 (GLOVE) ×2 IMPLANT
GOWN STRL REUS W/TWL XL LVL3 (GOWN DISPOSABLE) ×2 IMPLANT
IV NS IRRIG 3000ML ARTHROMATIC (IV SOLUTION) ×3 IMPLANT
KIT PROCEDURE FLUENT (KITS) ×1 IMPLANT
KIT TURNOVER CYSTO (KITS) ×2 IMPLANT
PACK VAGINAL MINOR WOMEN LF (CUSTOM PROCEDURE TRAY) ×2 IMPLANT
PAD OB MATERNITY 4.3X12.25 (PERSONAL CARE ITEMS) ×2 IMPLANT
PAD PREP 24X48 CUFFED NSTRL (MISCELLANEOUS) ×2 IMPLANT
SEAL ROD LENS SCOPE MYOSURE (ABLATOR) ×2 IMPLANT
SET GENESYS HTA PROCERVA (MISCELLANEOUS) ×2 IMPLANT
SUT VIC AB 3-0 SH 27 (SUTURE) ×2
SUT VIC AB 3-0 SH 27X BRD (SUTURE) IMPLANT
TOWEL OR 17X26 10 PK STRL BLUE (TOWEL DISPOSABLE) ×4 IMPLANT

## 2020-08-22 NOTE — Transfer of Care (Signed)
Immediate Anesthesia Transfer of Care Note  Patient: Leah Baker  Procedure(s) Performed: Procedure(s) (LRB): DILATATION & CURETTAGE/HYSTEROSCOPY CERVICAL REPAIR (N/A)  Patient Location: PACU  Anesthesia Type: General  Level of Consciousness: awake, oriented, sedated and patient cooperative  Airway & Oxygen Therapy: Patient Spontanous Breathing and Patient connected to face mask oxygen  Post-op Assessment: Report given to PACU RN and Post -op Vital signs reviewed and stable  Post vital signs: Reviewed and stable  Complications: No apparent anesthesia complications Last Vitals:  Vitals Value Taken Time  BP 119/82 08/22/20 1148  Temp 36.4 C 08/22/20 1148  Pulse 107 08/22/20 1152  Resp 25 08/22/20 1152  SpO2 98 % 08/22/20 1152  Vitals shown include unvalidated device data.  Last Pain:  Vitals:   08/22/20 0917  TempSrc: Oral  PainSc: 4       Patients Stated Pain Goal: 3 (15/[id number]/58 [id number])  Complications: No complications documented.

## 2020-08-22 NOTE — Discharge Instructions (Signed)
Hysteroscopy Hysteroscopy is a procedure used to look inside a woman's womb (uterus). This may be done for various reasons, including:  To look for tumors and other growths in the uterus.  To evaluate abnormal bleeding, fibroid tumors, polyps, scar tissue, or uterine cancer.  To determine why a woman is unable to get pregnant or has had repeated pregnancy losses.  To locate an IUD (intrauterine device).  To place a birth control device into the fallopian tubes. During this procedure, a thin, flexible tube with a small light and camera (hysteroscope) is used to examine the uterus. The camera sends images to a monitor in the room so that your health care provider can view the inside of your uterus. A hysteroscopy should be done right after a menstrual period. Tell a health care provider about:  Any allergies you have.  All medicines you are taking, including vitamins, herbs, eye drops, creams, and over-the-counter medicines.  Any problems you or family members have had with anesthetic medicines.  Any blood disorders you have.  Any surgeries you have had.  Any medical conditions you have.  Whether you are pregnant or may be pregnant.  Whether you have been diagnosed with an STI (sexually transmitted infection) or you think you have an STI. What are the risks? Generally, this is a safe procedure. However, problems may occur, including:  Excessive bleeding.  Infection.  Damage to the uterus or other structures or organs.  Allergic reaction to medicines or fluids that are used in the procedure. What happens before the procedure? Staying hydrated Follow instructions from your health care provider about hydration, which may include:  Up to 2 hours before the procedure - you may continue to drink clear liquids, such as water, clear fruit juice, black coffee, and plain tea. Eating and drinking restrictions Follow instructions from your health care provider about eating and  drinking, which may include:  8 hours before the procedure - stop eating solid foods and drink clear liquids only.  2 hours before the procedure - stop drinking clear liquids. Medicines  Ask your health care provider about: ? Changing or stopping your regular medicines. This is especially important if you are taking diabetes medicines or blood thinners. ? Taking medicines such as aspirin and ibuprofen. These medicines can thin your blood. Do not take these medicines unless your health care provider tells you to take them. ? Taking over-the-counter medicines, vitamins, herbs, and supplements.  Medicine may be placed in your cervix the day before the procedure. This medicine causes the cervix to open (dilate). The larger opening makes it easier for the hysteroscope to be inserted into the uterus during the procedure. General instructions  Ask your health care provider: ? What steps will be taken to help prevent infection. These steps may include:  Washing skin with a germ-killing soap.  Taking antibiotic medicine.  Do not use any products that contain nicotine or tobacco for at least 4 weeks before the procedure. These products include cigarettes, chewing tobacco, and vaping devices, such as e-cigarettes. If you need help quitting, ask your health care provider.  Plan to have a responsible adult take you home from the hospital or clinic.  Plan to have a responsible adult care for you for the time you are told after you leave the hospital or clinic. This is important.  Empty your bladder before the procedure begins. What happens during the procedure?  An IV will be inserted into one of your veins.  You may be given: ?  A medicine to help you relax (sedative). ? A medicine that numbs the area around the cervix (local anesthetic). ? A medicine to make you fall asleep (general anesthetic).  A hysteroscope will be inserted through your vagina and into your uterus.  Air or fluid will  be used to enlarge your uterus to allow your health care provider to see it better. The amount of fluid used will be carefully checked throughout the procedure.  In some cases, tissue may be gently scraped from inside the uterus and sent to a lab for testing (biopsy). The procedure may vary among health care providers and hospitals. What can I expect after the procedure?  Your blood pressure, heart rate, breathing rate, and blood oxygen level will be monitored until you leave the hospital or clinic.  You may have cramps. You may be given medicines for this.  You may have bleeding, which may vary from light spotting to menstrual-like bleeding. This is normal.  If you had a biopsy, it is up to you to get the results. Ask your health care provider, or the department that is doing the procedure, when your results will be ready. Follow these instructions at home: Activity  Rest as told by your health care provider.  Return to your normal activities as told by your health care provider. Ask your health care provider what activities are safe for you.  If you were given a sedative during the procedure, it can affect you for several hours. Do not drive or operate machinery until your health care provider says that it is safe. Medicines  Do not take aspirin or other NSAIDs during recovery, as told by your healthcare provider. It can increase the risk of bleeding.  Ask your health care provider if the medicine prescribed to you: ? Requires you to avoid driving or using machinery. ? Can cause constipation. You may need to take these actions to prevent or treat constipation:  Drink enough fluid to keep your urine pale yellow.  Take over-the-counter or prescription medicines.  Eat foods that are high in fiber, such as beans, whole grains, and fresh fruits and vegetables.  Limit foods that are high in fat and processed sugars, such as fried or sweet foods. General instructions  Do not douche,  use tampons, or have sex for 2 weeks after the procedure, or until your health care provider approves.  Do not take baths, swim, or use a hot tub until your health care provider approves. Take showers instead of baths for 2 weeks, or for as long as told by your health care provider.  Keep all follow-up visits. This is important. Contact a health care provider if:  You feel dizzy or lightheaded.  You feel nauseous.  You have abnormal vaginal discharge.  You have a rash.  You have pain that does not get better with medicine.  You have chills. Get help right away if:  You have bleeding that is heavier than a normal menstrual period.  You have a fever.  You have pain or cramps that get worse.  You develop new abdominal pain.  You faint.  You have pain in your shoulder.  You are short of breath. Summary  Hysteroscopy is a procedure that is used to look inside a woman's womb (uterus).  After the procedure, you may have bleeding, which varies from light spotting to menstrual-like bleeding. This is normal. You may also have cramps.  Do not douche, use tampons, or have sex for 2 weeks after  the procedure, or until your health care provider approves.  Plan to have a responsible adult take you home from the hospital or clinic. This information is not intended to replace advice given to you by your health care provider. Make sure you discuss any questions you have with your health care provider. Document Revised: 01/04/2020 Document Reviewed: 01/04/2020 Elsevier Patient Education  2021 [place] Instructions  Activity: Get plenty of rest for the remainder of the day. A responsible individual must stay with you for 24 hours following the procedure.  For the next 24 hours, DO NOT: -Drive a car -Paediatric nurse -Drink alcoholic beverages -Take any medication unless instructed by your physician -Make any legal decisions or sign important  papers.  Meals: Start with liquid foods such as gelatin or soup. Progress to regular foods as tolerated. Avoid greasy, spicy, heavy foods. If nausea and/or vomiting occur, drink only clear liquids until the nausea and/or vomiting subsides. Call your physician if vomiting continues.  Special Instructions/Symptoms: Your throat may feel dry or sore from the anesthesia or the breathing tube placed in your throat during surgery. If this causes discomfort, gargle with warm salt water. The discomfort should disappear within 24 hours.    No ibuprofen, Advil, Aleve, Motrin, or naproxen until after 5 pm today if needed.

## 2020-08-22 NOTE — Anesthesia Preprocedure Evaluation (Signed)
Anesthesia Evaluation  Patient identified by MRN, date of birth, ID band Patient awake    Reviewed: Allergy & Precautions, NPO status , Patient's Chart, lab work & pertinent test results  History of Anesthesia Complications (+) PONV and history of anesthetic complications  Airway Mallampati: II  TM Distance: >3 FB Neck ROM: Full    Dental no notable dental hx. (+) Teeth Intact, Dental Advisory Given   Pulmonary neg pulmonary ROS, former smoker,    Pulmonary exam normal breath sounds clear to auscultation       Cardiovascular negative cardio ROS Normal cardiovascular exam Rhythm:Regular Rate:Normal     Neuro/Psych Anxiety Depression negative neurological ROS     GI/Hepatic Neg liver ROS, Hemorrhoids    Endo/Other  negative endocrine ROS  Renal/GU negative Renal ROS  Female GU complaint AUB    Musculoskeletal negative musculoskeletal ROS (+)   Abdominal Normal abdominal exam  (+)   Bowel sounds: normal.  Peds negative pediatric ROS (+)  Hematology  (+) Blood dyscrasia, anemia ,   Anesthesia Other Findings   Reproductive/Obstetrics negative OB ROS                             Anesthesia Physical  Anesthesia Plan  ASA: II  Anesthesia Plan: General   Post-op Pain Management:    Induction: Intravenous  PONV Risk Score and Plan: 4 or greater and Ondansetron, Treatment may vary due to age or medical condition, Dexamethasone, Midazolam and Scopolamine patch - Pre-op  Airway Management Planned: Mask and LMA  Additional Equipment: None  Intra-op Plan:   Post-operative Plan: Extubation in OR  Informed Consent: I have reviewed the patients History and Physical, chart, labs and discussed the procedure including the risks, benefits and alternatives for the proposed anesthesia with the patient or authorized representative who has indicated his/her understanding and acceptance.      Dental advisory given  Plan Discussed with: CRNA and Anesthesiologist  Anesthesia Plan Comments: ( Lab Results      Component                Value               Date                      WBC                      6.1                 08/22/2020                HGB                      11.0 (L)            08/22/2020                HCT                      34.7 (L)            08/22/2020                MCV                      86.1  08/22/2020                PLT                      486 (H)             08/22/2020           Lab Results      Component                Value               Date                      NA                       139                 07/30/2020                K                        4.4                 07/30/2020                CO2                      18 (L)              07/30/2020                GLUCOSE                  95                  07/30/2020                BUN                      12                  07/30/2020                CREATININE               1.01 (H)            07/30/2020                CALCIUM                  9.5                 07/30/2020                GFRNONAA                 65                  03/28/2020                GFRAA                    75                  03/28/2020           Lab Results      Component  Value               Date                      PREGTESTUR               NEGATIVE            08/22/2020                HCG                      <5.0                05/14/2019           )        Anesthesia Quick Evaluation

## 2020-08-22 NOTE — Anesthesia Procedure Notes (Signed)
Procedure Name: LMA Insertion Date/Time: 08/22/2020 10:31 AM Performed by: Suan Halter, CRNA Pre-anesthesia Checklist: Patient identified, Emergency Drugs available, Suction available and Patient being monitored Patient Re-evaluated:Patient Re-evaluated prior to induction Oxygen Delivery Method: Circle system utilized Preoxygenation: Pre-oxygenation with 100% oxygen Induction Type: IV induction Ventilation: Mask ventilation without difficulty LMA: LMA inserted LMA Size: 4.0 Number of attempts: 1 Airway Equipment and Method: Bite block Placement Confirmation: positive ETCO2 Tube secured with: Tape Dental Injury: Teeth and Oropharynx as per pre-operative assessment

## 2020-08-22 NOTE — Op Note (Signed)
Preoperative diagnosis: Menorrhagia  Postoperative diagnosis: Menorrhagia  Procedure: attempt at HTA - failed, hysteroscopy, repair of cervical lacerations  Surgeon: Standley Dakins. Kennon Rounds, [name]  Anesthesia: GETT  Complications: fundal uterine perforation  Findings: small appearing cervix, normal endometrial cavity with fundal perforation  Estimated blood loss: Minimal  Specimens: Endometrial curettings  Disposition of specimen: Pathology  Reason for procedure: Patient is a 40 y.o. [id number] with menorrhagia. On megace and desired HTA.    Procedure: Patient was taken to the OR where she was placed in dorsal lithotomy in [place]. SCDs were in place. An adequate timeout was obtained. The patient was prepped and draped in the usual sterile fashion. A red rubber catheter is used to drain her bladder. A speculum was placed inside the vagina and the cervix visualized. The cervix was grasped anteriorly with a single-tooth tenaculum. 20 cc of quarter percent Marcaine were injected for paracervical block. An os finder was used. Sequential dilation was done to a #23 dilator, and the HTA with hysteroscope was attempted to be introduced into the uterine cavity. The tenaculum pulled through the cervix on numerous occasions.Eventually the cavity was entered and attempt at HTA x 3 with failure to pass the seal test. A diagnostic hysteroscopy was done and non-bleeding, fundal perforation noted. Instruments removed and cervical laceration sewed shut with 3-0 Vicryl on SH, running with excellent hemostasis.  All instrument, needle, and lap counts were correct x2. The patient was awakened taken to recovery room in stable condition.  [name] MD 08/22/2020 11:39 AM    \

## 2020-08-22 NOTE — Anesthesia Postprocedure Evaluation (Signed)
Anesthesia Post Note  Patient: Leah Baker  Procedure(s) Performed: DILATATION & CURETTAGE/HYSTEROSCOPY CERVICAL REPAIR (N/A Uterus)     Patient location during evaluation: PACU Anesthesia Type: General Level of consciousness: awake and alert Pain management: pain level controlled Vital Signs Assessment: post-procedure vital signs reviewed and stable Respiratory status: spontaneous breathing, nonlabored ventilation, respiratory function stable and patient connected to nasal cannula oxygen Cardiovascular status: blood pressure returned to baseline and stable Postop Assessment: no apparent nausea or vomiting Anesthetic complications: no   No complications documented.  Last Vitals:  Vitals:   08/22/20 1229 08/22/20 1320  BP: (!) 130/92 125/87  Pulse: 92 83  Resp: 20 14  Temp:    SpO2: 100% 100%    Last Pain:  Vitals:   08/22/20 1320  TempSrc:   PainSc: 4                  Jaydrian Corpening P Pranish Akhavan

## 2020-08-22 NOTE — Interval H&P Note (Signed)
History and Physical Interval Note:  08/22/2020 9:02 AM  Leah Baker  has presented today for surgery, with the diagnosis of AUB.  The various methods of treatment have been discussed with the patient and family. After consideration of risks, benefits and other options for treatment, the patient has consented to  Procedure(s): DILATATION & CURETTAGE/HYSTEROSCOPY WITH HYDROTHERMAL ABLATION (N/A) as a surgical intervention.  The patient's history has been reviewed, patient examined, no change in status, stable for surgery.  I have reviewed the patient's chart and labs.  Questions were answered to the patient's satisfaction.     [name]

## 2020-08-23 ENCOUNTER — Encounter (HOSPITAL_BASED_OUTPATIENT_CLINIC_OR_DEPARTMENT_OTHER): Payer: Self-pay | Admitting: Family Medicine

## 2020-08-30 ENCOUNTER — Ambulatory Visit: Payer: Medicaid Other | Admitting: General Surgery

## 2020-09-10 ENCOUNTER — Encounter: Payer: Self-pay | Admitting: *Deleted

## 2020-09-10 NOTE — Progress Notes (Signed)
Subjective:    Patient ID: Leah Baker, female    [name]: Sep 24, 1980, 40 y.o.   MRN: [id number]  09/07/18 This is a 40 year old female seen back in follow-up for lower GI bleeding, blood loss anemia, internal hemorrhoids, and iliac and right inguinal lymphadenopathy, and now a new problem regarding menorrhagia.  Since last visit the patient required hospitalization with a hemoglobin of 5 and was determined to have excess uterine bleeding in addition to the ongoing hemorrhoidal bleeding from internal hemorrhoids.  The patient continues to have right lower quadrant abdominal pain and right inguinal lymphadenopathy and right iliac lymphadenopathy.  Note previous abdominal CT scans have not revealed pelvic pathology.  The patient has since seen gynecology Dr. Roselie Awkward upon request of the general surgeon.  It appears that the vaginal and abdominal ultrasound will be obtained and is scheduled next week.  Previous ultrasounds of the abdomen and pelvis in 2014 was normal.  There is been a prior history of heavy vaginal bleeding and this is a compounded the iron deficiency anemia.  1 recommendation of the discharge summary was for the patient received intravenous iron.  In the hospitalization she received 2 units of packed cells and 1 dose of 510 mg Feraheme.  Currently the patient shortness of breath is better but her abdominal pain continues to persist.  She has had no additional chest pains.  Note she cannot take nonsteroidal anti-inflammatories due to the bleeding.  Note also the patient is now on Megace in addition to iron supplementation and this has helped to slow down the menstrual bleeding.  The patient also had hypokalemia which needs to be followed up as well  01/18/2019 Since the last office visit the patient had to go to urgent care on August 4 and then had a follow-up with gynecology on August 5 for menorrhagia and continued abdominal pain.  The patient's been maintained on Megace 40 mg twice  daily.  General surgery states the internal hemorrhoids do not require further treatment and is not the cause of the abdominal pain.  Gynecology feels that a ablation procedure is indicated for the excess menstrual bleeding.  Patient will first need an endometrial biopsy and this is pending.  The patient is yet to receive her Feraheme injections.  The patient had a hemoglobin checked on August 4 it was stable at 12.7. Since last OV went to [facility]  8/4 and saw Gyn 8/5 for menorrhagia and continued abdominal pain.    03/30/2019 This patient was last seen in August and since that time the patient has had an eventful 2 months.  In September the patient was referred to gynecologic oncology for an endometrial biopsy which was negative for cancer.  General gynecology has determined hormonal therapy is the best for her excess bleeding during periods.  The patient has persistent right inguinal lymphadenopathy and iliac lymphadenopathy.  An MRI had been performed and showed not much change from in April CT scan.  The patient comes in today very frustrated and that she feels that she has been to multiple specialists but does not have a clear answer as to what her condition is.  Patient also appears to be under a great deal of stress and that she has multiple family members including her father who is currently in the intensive care unit at [facility] with severe heart failure.  She had seen oncology and recommendation there was to perform a complete lymph node excision of the right inguinal area and she is been  referred back to [facility] surgery for this.  She tried to go for a visit last week but was denied entrance when her temperature was at 99 degrees.  She does not clearly have any evidence of Covid at this time.  She has a new appointment with surgery upcoming and I am going to be messaging the surgeon about the need for the lymph node biopsy.  She has had multiple blood work done by oncology to determine if  there is an oncologic issue and it is been negative so far.  The hemorrhoidal rectal bleeding appears to be stable and is not a major issue.  The gynecologic oncologist indicated that she did not need Feraheme infusions and upon review of her CBCs through July up into this date all of her hemoglobins have stabilized to the 13 range.  She is on oral iron supplementation 4 times daily.  She has significant GI upset when she takes the oral iron.  The patient is still experiencing abdominal pain in the right lower quadrant she states when she was on tramadol this provided the best relief.  She was offered diclofenac but this is causing excess bleeding therefore this was not taken.  She states her pain is on a scale of 0-10  Is 7    05/23/19: Since this last visit with this patient in October the patient has been to the emergency room with a hemoglobin of 8.8 several weeks ago and ongoing menorrhagia.  The patient has a pending appointment with gynecology general surgery and gastroenterology in follow-up.  An MRI of the abdomen is now been ordered at this time.  She does take iron supplementation.  She continues to have pain in the lower abdominal area.  She appears to have minimal rectal bleeding the most the bleeding comes from the vagina.  The inguinal lymphadenopathy continues to be a concern and she has been seen by oncology who also is anticipating MRI of the abdomen.  The MRI of the abdomen appears to been scheduled for 30 December  The patient does have some lightheadedness at times.  She has had no syncope as well.   07/30/2020 This is a 40 year old female seen here today in follow-up for chronic medical conditions.  I last saw this patient in December 2020.  Since that time in 2021 the patient was followed by Dr. Chapman Fitch who is now left the practice.  Patient was receiving Megace to control significant menorrhagia.  She also had right lower quadrant iliac lymph node which was biopsied since I last  saw her.  Biopsy did reveal lymphoid hyperplasia.  In the interim she also was seen by gastroenterology and they diagnosed hemochromatosis familial.  We do not have any of those records.  When I last saw her in December 2020 her ferritin levels were extremely low and there was no signs of excess iron overload.  Rather the patient had iron deficiency because of chronic bleeding from the rectum and vagina areas.  Patient still has occasional rectal bleeding.  We have yet to had this completely evaluated.  Patient is due to have a D&C and associated hydrotherapy of her endometrium with ablation technique.  This procedure scheduled March 23 with Dr. Kennon Rounds.  Today the patient's largest complaint is that of ongoing anxiety.  Her father is undergoing heart transplant and her grandmother has stage IV cancer in hospice.  09/11/20 This patient seen back in return follow-up she underwent a procedure to see if she could have an  ablation done of the uterus unfortunately could not get into the uterus ultimately so the cervix shot.  This was the end of March.  Patient has a follow-up appointment to gynecology.  I messaged gynecology during this visit with the patient they indicated they will discussed with the patient further measures which might include hysterectomy but that would come with some degree of complicating risk.  Patient states she is actually not bleeding currently.  They were able to look into the uterus and could see no evidence of endometriosis or active bleeding.  Patient still on high-dose Megace.  On another matter this patient has lymphoid hyperplasia in the right lower inguinal lymph node and this is reactive in nature and this is being followed.  Patient also has genetic testing showing she has familial hemochromatosis trait however she does not actually have full-blown hemochromatosis.  This is being observed.  I have documented into her chart the changes based on the GI paperwork we  received.  Patient is receiving still iron supplementation for iron deficiency anemia.  Last hemoglobin was greater than 10.  Patient has a follow-up visit with general surgery to evaluate her hemorrhoids.  Patient has an upcoming mammogram appointment as well.  Past Medical History:  Diagnosis Date  . Abnormal uterine bleeding (AUB)   . Anemia    Phreesia 06/08/2020  . Anxiety   . Blood in stool last 2 years  . Depression   . Depression    Phreesia 06/08/2020  . Hemorrhoids   . History of blood transfusion 05/2019 2 units   iron transfusion given also  . History of lower GI bleeding dec 2021 active problem   s/p  colonoscopy 09-01-2018,  hemorrhoids  . IDA (iron deficiency anemia)   . Inguinal lymphadenopathy    CT 09-14-2018  right side, followed by pcp  . PONV (postoperative nausea and vomiting)    only happened once  . Wears glasses   . Wears glasses      Family History  Problem Relation Age of Onset  . Cancer Mother   . Kidney failure Mother   . Healthy Father      Social History   Socioeconomic History  . Marital status: Significant Other    Spouse name: Not on file  . Number of children: Not on file  . Years of education: Not on file  . Highest education level: Not on file  Occupational History  . Not on file  Tobacco Use  . Smoking status: Former Smoker    Years: 5.00    Types: Cigarettes    Quit date: 08/22/2008    Years since quitting: 12.0  . Smokeless tobacco: Never Used  . Tobacco comment: social  Vaping Use  . Vaping Use: Never used  Substance and Sexual Activity  . Alcohol use: Not Currently  . Drug use: Not Currently    Comment: per pt younger in college marijuana, none since  . Sexual activity: Yes    Birth control/protection: Surgical  Other Topics Concern  . Not on file  Social History Narrative  . Not on file   Social Determinants of Health   Financial Resource Strain: Not on file  Food Insecurity: Not on file   Transportation Needs: Not on file  Physical Activity: Not on file  Stress: Not on file  Social Connections: Not on file  Intimate Partner Violence: Not on file     Allergies  Allergen Reactions  . Latex Itching and Rash  Outpatient Medications Prior to Visit  Medication Sig Dispense Refill  . acetaminophen (TYLENOL) 500 MG tablet Take 1 tablet (500 mg total) by mouth every 6 (six) hours as needed. 30 tablet 0  . Ferrous Sulfate (IRON) 325 (65 Fe) MG TABS Take by mouth in the morning and at bedtime.    . megestrol (MEGACE) 40 MG tablet Take 2 tablets (80 mg total) by mouth 2 (two) times daily. 120 tablet 2  . Menthol-Camphor (TIGER BALM ARTHRITIS RUB EX) Apply 1 application topically daily as needed (knee pain).     Facility-Administered Medications Prior to Visit  Medication Dose Route Frequency Provider Last Rate Last Admin  . bupivacaine liposome (EXPAREL) 1.3 % injection 266 mg  20 mL Infiltration Once Stark Klein, MD         Constitutional:   No  weight loss, night sweats,  Fevers, chills, fatigue, lassitude. HEENT:   No headaches,  Difficulty swallowing,  Tooth/dental problems,  Sore throat,                No sneezing, itching, ear ache, nasal congestion, post nasal drip,   CV:   chest pain,  Orthopnea, PND, swelling in lower extremities, anasarca, dizziness, palpitations  GI   heartburn, indigestion, abdominal pain, nausea, vomiting, diarrhea, change in bowel habits, loss of appetite,   Resp: No shortness of breath with exertion or at rest.  No excess mucus, no productive cough,  No non-productive cough,  No coughing up of blood.  No change in color of mucus.  No wheezing.  No chest wall deformity  Skin: no rash or lesions.  GU: no dysuria, change in color of urine, no urgency or frequency.  No flank pain.  MS:  No joint pain or swelling.  No decreased range of motion.  No back pain.  Psych:  No change in mood or affect. No depression or anxiety.  No memory  loss.     Objective:   Physical Exam  Vitals:   09/11/20 0839  BP: 109/76  Pulse: 92  SpO2: 99%  Weight: 174 lb 9.6 oz (79.2 kg)  Height: 5\' 2"  (1.575 m)    Gen: Pleasant, well-nourished, in no distress,  normal affect  ENT: No lesions,  mouth clear,  oropharynx clear, no postnasal drip  Neck: No JVD, no TMG, no carotid bruits  Lungs: No use of accessory muscles, no dullness to percussion, clear without rales or rhonchi  Cardiovascular: RRR, heart sounds normal, no murmur or gallops, no peripheral edema  Abdomen: soft and NT, no HSM,  BS normal  Musculoskeletal: No deformities, no cyanosis or clubbing  Neuro: alert, non focal  Skin: Warm, no lesions or rashes    CBC Latest Ref Rng & Units 08/22/2020 07/30/2020 06/28/2020  WBC 4.0 - 10.5 K/uL 6.1 6.8 7.9  Hemoglobin 12.0 - 15.0 g/dL 11.0(L) 10.6(L) 11.7  Hematocrit 36.0 - 46.0 % 34.7(L) 33.6(L) 35.3  Platelets 150 - 400 K/uL 486(H) 513(H) 467(H)   BMP Latest Ref Rng & Units 07/30/2020 03/28/2020 11/13/2019  Glucose 65 - 99 mg/dL 95 91 98  BUN 6 - 24 mg/dL 12 13 8   Creatinine 0.57 - 1.00 mg/dL 1.01(H) 1.06(H) 0.85  BUN/Creat Ratio 9 - 23 12 12  -  Sodium 134 - 144 mmol/L 139 136 138  Potassium 3.5 - 5.2 mmol/L 4.4 4.2 4.3  Chloride 96 - 106 mmol/L 107(H) 105 109  CO2 20 - 29 mmol/L 18(L) 18(L) 21(L)  Calcium 8.7 - 10.2 mg/dL 9.5 9.8  9.6   12/16/18 Vag U/S IMPRESSION: 1. RIGHT adnexal mass, discrete from the RIGHT ovary is 1.9 centimeters and avascular on Doppler evaluation. This likely represents enlarged RIGHT external iliac lymph node as seen on previous CT exams of the abdomen and pelvis 09/14/2018 and 08/31/2018. 2. Given the persistence of RIGHT iliac lymph node enlargement, consider biopsy of RIGHT inguinal lymph node, seen on prior CT exam. 3. Normal endometrial thickness. If bleeding remains unresponsive to hormonal or medical therapy, sonohysterogram should be considered for focal lesion work-up. (Ref:  Radiological Reasoning: Algorithmic  Iron level 14- 17  (low normal 28) from 6/27  MRI pelvis:9/22 IMPRESSION: Persistent right inguinal and iliac adenopathy, previously biopsied and within 1-2 mm of size when compared to 09/14/2018. Significance uncertain given unilateral enlargement, unusual for pelvic nodes with reactive changes. Imaging appearance remains nonspecific but again raises the question of lymphoproliferative disorder. Correlate with previous biopsy results with imaging and clinical follow-up as warranted.  Mass on ultrasound likely represented pelvic sidewall/external iliac nodal enlargement on the right.  Mammogram 03/2019 : neg  10/30:  [place] abd: IMPRESSION: 2.6 cm subcapsular left hepatic hypoechoic lesion by ultrasound remains indeterminate and correlates with the CT finding. Recommend follow-up nonemergent evaluation with MRI without and with contrast.  Negative for gallstones or biliary dilatation  No other acute finding by ultrasound.     Assessment & Plan:  I personally reviewed all images and lab data in the [facility] system as well as any outside material available during this office visit and agree with the  radiology impressions.   Rectal bleeding This is due to internal hemorrhoids and is intermittent only and mild will follow general surgery  Hemorrhoid As per rectal bleeding assessment  Menorrhagia Currently has decreased since cervix has been sewn shut patient has follow-up with gynecology will continue Megace  Liver lesion Pathology from the liver lesion shows focal nodular hyperplasia without malignancy will be observed  Iron deficiency anemia Continue oral iron supplement  Carrier of hemochromatosis HFE gene mutation Patient is a carrier and does not have full-blown hemochromatosis patient knows that if her partner and father of her 2 children had the gene and there is a 25% chance her children could be affected they need to be advised  currently the patient does not have a risks significant of obtaining  hemochromatosis   Joyelle was seen today for follow-up.  Diagnoses and all orders for this visit:  Rectal bleeding  Hemorrhoids, unspecified hemorrhoid type  Menorrhagia with irregular cycle  Liver lesion  Iron deficiency anemia due to chronic blood loss  Carrier of hemochromatosis HFE gene mutation   I spent 40 minutes reviewing the patient's records reviewing outside records and engaging with this patient with complex medical decision making high also conferring with gynecology

## 2020-09-11 ENCOUNTER — Encounter: Payer: Self-pay | Admitting: Critical Care Medicine

## 2020-09-11 ENCOUNTER — Other Ambulatory Visit: Payer: Self-pay

## 2020-09-11 ENCOUNTER — Ambulatory Visit: Payer: Medicaid Other | Attending: Critical Care Medicine | Admitting: Critical Care Medicine

## 2020-09-11 DIAGNOSIS — K769 Liver disease, unspecified: Secondary | ICD-10-CM | POA: Diagnosis not present

## 2020-09-11 DIAGNOSIS — N921 Excessive and frequent menstruation with irregular cycle: Secondary | ICD-10-CM | POA: Diagnosis not present

## 2020-09-11 DIAGNOSIS — Z148 Genetic carrier of other disease: Secondary | ICD-10-CM

## 2020-09-11 DIAGNOSIS — K625 Hemorrhage of anus and rectum: Secondary | ICD-10-CM

## 2020-09-11 DIAGNOSIS — D500 Iron deficiency anemia secondary to blood loss (chronic): Secondary | ICD-10-CM

## 2020-09-11 DIAGNOSIS — K649 Unspecified hemorrhoids: Secondary | ICD-10-CM

## 2020-09-11 NOTE — Patient Instructions (Addendum)
No change in medications  I sent a message Dr. Kennon Rounds to see if hysterectomy is possible in her case and what other contraindications  Continue to work on your weight by modifying her diet below are dietary recommendations this will help your abdominal pain  Continue taking the iron supplements you do not have hemochromatosis you just or carrier state  Return to see Dr. [name] 3 months  Keep your mammogram appointment upcoming and your gynecology appointments upcoming   Obesity, Adult Obesity is having too much body fat. Being obese means that your weight is more than what is healthy for you. BMI is a number that explains how much body fat you have. If you have a BMI of 30 or more, you are obese. Obesity is often caused by eating or drinking more calories than your body uses. Changing your lifestyle can help you lose weight. Obesity can cause serious health problems, such as:  Stroke.  Coronary artery disease (CAD).  Type 2 diabetes.  Some types of cancer, including cancers of the colon, breast, uterus, and gallbladder.  Osteoarthritis.  High blood pressure (hypertension).  High cholesterol.  Sleep apnea.  Gallbladder stones.  Infertility problems. What are the causes?  Eating meals each day that are high in calories, sugar, and fat.  Being born with genes that may make you more likely to become obese.  Having a medical condition that causes obesity.  Taking certain medicines.  Sitting a lot (having a sedentary lifestyle).  Not getting enough sleep.  Drinking a lot of drinks that have sugar in them. What increases the risk?  Having a family history of obesity.  Being an [place] American woman.  Being a Hispanic man.  Living in an area with limited access to: ? Romilda Garret, recreation centers, or sidewalks. ? Healthy food choices, such as grocery stores and farmers' markets. What are the signs or symptoms? The main sign is having too much body fat. How is this  treated?  Treatment for this condition often includes changing your lifestyle. Treatment may include: ? Changing your diet. This may include making a healthy meal plan. ? Exercise. This may include activity that causes your heart to beat faster (aerobic exercise) and strength training. Work with your doctor to design a program that works for you. ? Medicine to help you lose weight. This may be used if you are not able to lose 1 pound a week after 6 weeks of healthy eating and more exercise. ? Treating conditions that cause the obesity. ? Surgery. Options may include gastric banding and gastric bypass. This may be done if:  Other treatments have not helped to improve your condition.  You have a BMI of 40 or higher.  You have life-threatening health problems related to obesity. Follow these instructions at home: Eating and drinking  Follow advice from your doctor about what to eat and drink. Your doctor may tell you to: ? Limit fast food, sweets, and processed snack foods. ? Choose low-fat options. For example, choose low-fat milk instead of whole milk. ? Eat 5 or more servings of fruits or vegetables each day. ? Eat at home more often. This gives you more control over what you eat. ? Choose healthy foods when you eat out. ? Learn to read food labels. This will help you learn how much food is in 1 serving. ? Keep low-fat snacks available. ? Avoid drinks that have a lot of sugar in them. These include soda, fruit juice, iced tea with sugar,  and flavored milk.  Drink enough water to keep your pee (urine) pale yellow.  Do not go on fad diets.   Physical activity  Exercise often, as told by your doctor. Most adults should get up to 150 minutes of moderate-intensity exercise every week.Ask your doctor: ? What types of exercise are safe for you. ? How often you should exercise.  Warm up and stretch before being active.  Do slow stretching after being active (cool down).  Rest between  times of being active. Lifestyle  Work with your doctor and a food expert (dietitian) to set a weight-loss goal that is best for you.  Limit your screen time.  Find ways to reward yourself that do not involve food.  Do not drink alcohol if: ? Your doctor tells you not to drink. ? You are pregnant, may be pregnant, or are planning to become pregnant.  If you drink alcohol: ? Limit how much you use to:  0-1 drink a day for women.  0-2 drinks a day for men. ? Be aware of how much alcohol is in your drink. In the [place] one drink equals one 12 oz bottle of beer (355 mL), one 5 oz glass of wine (148 mL), or one 1 oz glass of hard liquor (44 mL). General instructions  Keep a weight-loss journal. This can help you keep track of: ? The food that you eat. ? How much exercise you get.  Take over-the-counter and prescription medicines only as told by your doctor.  Take vitamins and supplements only as told by your doctor.  Think about joining a support group.  Keep all follow-up visits as told by your doctor. This is important. Contact a doctor if:  You cannot meet your weight loss goal after you have changed your diet and lifestyle for 6 weeks. Get help right away if you:  Are having trouble breathing.  Are having thoughts of harming yourself. Summary  Obesity is having too much body fat.  Being obese means that your weight is more than what is healthy for you.  Work with your doctor to set a weight-loss goal.  Get regular exercise as told by your doctor. This information is not intended to replace advice given to you by your health care provider. Make sure you discuss any questions you have with your health care provider. Document Revised: 01/21/2018 Document Reviewed: 01/21/2018 Elsevier Patient Education  2021 [employer].

## 2020-09-11 NOTE — Assessment & Plan Note (Signed)
Currently has decreased since cervix has been sewn shut patient has follow-up with gynecology will continue Megace

## 2020-09-11 NOTE — Assessment & Plan Note (Signed)
As per rectal bleeding assessment

## 2020-09-11 NOTE — Progress Notes (Signed)
Abdominal pain Back Pain

## 2020-09-11 NOTE — Assessment & Plan Note (Signed)
Patient is a carrier and does not have full-blown hemochromatosis patient knows that if her partner and father of her 2 children had the gene and there is a 25% chance her children could be affected they need to be advised currently the patient does not have a risks significant of obtaining  hemochromatosis

## 2020-09-11 NOTE — Assessment & Plan Note (Signed)
This is due to internal hemorrhoids and is intermittent only and mild will follow general surgery

## 2020-09-11 NOTE — Assessment & Plan Note (Signed)
Continue oral iron supplement °

## 2020-09-11 NOTE — Assessment & Plan Note (Signed)
Pathology from the liver lesion shows focal nodular hyperplasia without malignancy will be observed

## 2020-09-13 ENCOUNTER — Ambulatory Visit (INDEPENDENT_AMBULATORY_CARE_PROVIDER_SITE_OTHER): Payer: Medicaid Other | Admitting: Family Medicine

## 2020-09-13 ENCOUNTER — Encounter: Payer: Self-pay | Admitting: Family Medicine

## 2020-09-13 VITALS — BP 110/80 | HR 108 | Ht 62.0 in | Wt 173.0 lb

## 2020-09-13 DIAGNOSIS — F419 Anxiety disorder, unspecified: Secondary | ICD-10-CM

## 2020-09-13 DIAGNOSIS — Z1331 Encounter for screening for depression: Secondary | ICD-10-CM | POA: Diagnosis not present

## 2020-09-13 DIAGNOSIS — F430 Acute stress reaction: Secondary | ICD-10-CM | POA: Diagnosis not present

## 2020-09-13 DIAGNOSIS — N921 Excessive and frequent menstruation with irregular cycle: Secondary | ICD-10-CM

## 2020-09-13 DIAGNOSIS — F411 Generalized anxiety disorder: Secondary | ICD-10-CM | POA: Diagnosis not present

## 2020-09-13 DIAGNOSIS — F329 Major depressive disorder, single episode, unspecified: Secondary | ICD-10-CM

## 2020-09-13 MED ORDER — NORETHINDRONE ACETATE 5 MG PO TABS: 15.0000 mg | ORAL_TABLET | Freq: Every day | ORAL | 3 refills | Status: DC

## 2020-09-13 MED ORDER — SERTRALINE HCL 100 MG PO TABS: 200.0000 mg | ORAL_TABLET | Freq: Every day | ORAL | 3 refills | Status: DC

## 2020-09-13 NOTE — Progress Notes (Signed)
   Subjective:    Patient ID: Leah Baker is a 40 y.o. female presenting with Post-op Follow-up  on 09/13/2020  HPI: Here for post op check. She underwent D & C with hysteroscopy and attempt at HTA. Unfortunately had a uterine perforation and HTA could not be completed. She has been on Megace and her anemia is markedly improved. Her weight is up due to this. She has previously expressed no desire to have hysterectomy or IUD. Has had a lot of emotional trauma with death of 3 close loved ones, loss of job due to unvaccinated status, and loss of home. Her Rx for Zoloft was discontinued and she has been having "brain zaps" since that time. She is emotional today.  Review of Systems  Constitutional: Negative for chills and fever.  Respiratory: Negative for shortness of breath.   Cardiovascular: Negative for chest pain.  Gastrointestinal: Negative for abdominal pain, nausea and vomiting.  Genitourinary: Negative for dysuria.  Skin: Negative for rash.      Objective:    BP 110/80   Pulse (!) 108   Ht 5\' 2"  (1.575 m)   Wt 173 lb (78.5 kg)   BMI 31.64 kg/m  Physical Exam Constitutional:      General: She is not in acute distress.    Appearance: She is well-developed.  HENT:     Head: Normocephalic and atraumatic.  Eyes:     General: No scleral icterus. Cardiovascular:     Rate and Rhythm: Normal rate.  Pulmonary:     Effort: Pulmonary effort is normal.  Abdominal:     Palpations: Abdomen is soft.  Musculoskeletal:     Cervical back: Neck supple.  Skin:    General: Skin is warm and dry.  Neurological:     Mental Status: She is alert and oriented to person, place, and time.  Psychiatric:        Mood and Affect: Mood is anxious. Affect is tearful.         Assessment & Plan:   Problem List Items Addressed This Visit      Unprioritized   Depression    Resumed her Zoloft, may take 1/2 dosing for few days to start.       Relevant Medications   sertraline (ZOLOFT)  100 MG tablet   Menorrhagia    Strongly declines hysterectomy. Discussed IUD or alternative pill as potential. Will begin Aygestin. Will re-visit more conservative measures as needed.      Relevant Medications   norethindrone (AYGESTIN) 5 MG tablet   Anxiety    Self care, Zoloft, and IBH referral through PCP      Relevant Medications   sertraline (ZOLOFT) 100 MG tablet   Other Relevant Orders   Ambulatory referral to [place]    Other Visit Diagnoses    Positive screening for depression on 2-item Patient Health Questionnaire (PHQ-2)    -  Primary   Relevant Orders   Ambulatory referral to [place] as acute reaction to exceptional stress       Relevant Medications   sertraline (ZOLOFT) 100 MG tablet      Return in about 4 weeks (around 10/11/2020) for virtual.  [name] 09/13/2020 3:04 PM

## 2020-09-13 NOTE — Assessment & Plan Note (Signed)
Resumed her Zoloft, may take 1/2 dosing for few days to start.

## 2020-09-13 NOTE — Assessment & Plan Note (Signed)
Self care, Zoloft, and IBH referral through PCP

## 2020-09-13 NOTE — Assessment & Plan Note (Signed)
Strongly declines hysterectomy. Discussed IUD or alternative pill as potential. Will begin Aygestin. Will re-visit more conservative measures as needed.

## 2020-09-17 NOTE — BH Specialist Note (Signed)
Integrated Behavioral Health via Telemedicine Visit  09/17/2020 Leah Baker [id number]  Number of [place] visits: 1 Session Start time: 9:17  Session End time: 10:12 Total time: 95   Referring Provider: Darron Doom, MD Patient/Family location: Home [facility] Provider location: Center for [facility] at [facility]  All persons participating in visit: [name] and [place]   Types of Service: Individual psychotherapy and Video visit  I connected with [name] and/or Herbert Spires Leathers's n/a via  Telephone or Video Enabled Telemedicine Application  (Video is Caregility application) and verified that I am speaking with the correct person using two identifiers. Discussed confidentiality: Yes   I discussed the limitations of telemedicine and the availability of in person appointments.  Discussed there is a possibility of technology failure and discussed alternative modes of communication if that failure occurs.  I discussed that engaging in this telemedicine visit, they consent to the provision of behavioral healthcare and the services will be billed under their insurance.  Patient and/or legal guardian expressed understanding and consented to Telemedicine visit: Yes   Presenting Concerns: Patient and/or family reports the following symptoms/concerns: Pt states her primary concern today is feeling overwhelmed after failed surgery, along with experiencing numerous health issues(and father's recent health issues) and several recent losses(grandfather, step father, step grandmother; triggering memories of loss of mother and grandmother) ; pt is open to learning self-coping strategy today.  Duration of problem: Increase over time; Severity of problem: moderate  Patient and/or Family's Strengths/Protective Factors: Social and Emotional competence, Concrete supports in place (healthy food, safe environments, etc.), Sense of  purpose and Physical Health (exercise, healthy diet, medication compliance, etc.)  Goals Addressed: Patient will: 1.  Reduce symptoms of: anxiety, depression and stress  2.  Increase knowledge and/or ability of: self-management skills  3.  Demonstrate ability to: Increase healthy adjustment to current life circumstances  Progress towards Goals: Ongoing  Interventions: Interventions utilized:  Mindfulness or Psychologist, educational and Psychoeducation and/or Health Education Standardized Assessments completed: Not Needed  Patient and/or Family Response: Pt agrees to treatment plan  Assessment: Patient currently experiencing Adjustment disorder with mixed anxiety and depressed mood.   Patient may benefit from psychoeducation and brief therapeutic interventions regarding coping with symptoms of depression, anxiety, stress   Plan: 1. Follow up with behavioral health clinician on : Two weeks 2. Behavioral recommendations:  -CALM relaxation breathing twice daily for two weeks (morning; at bedtime with sleep sounds) -Read educational materials regarding coping with symptoms of panic attack  3. Referral(s): [place] (In Clinic)  I discussed the assessment and treatment plan with the patient and/or parent/guardian. They were provided an opportunity to ask questions and all were answered. They agreed with the plan and demonstrated an understanding of the instructions.   They were advised to call back or seek an in-person evaluation if the symptoms worsen or if the condition fails to improve as anticipated.  Garlan Fair, LCSW   Depression screen [facility] 2/9 09/13/2020 09/11/2020 07/30/2020 06/08/2020 03/28/2020  Decreased Interest 2 2 3  0 1  Down, Depressed, Hopeless 2 3 2  0 1  PHQ - 2 Score 4 5 5  0 2  Altered sleeping 0 2 3 - 1  Tired, decreased energy 2 2 2  - 1  Change in appetite 2 2 2  - 0  Feeling bad or failure about yourself  1 2 2  - 0  Trouble  concentrating 1 1 1  -  0  Moving slowly or fidgety/restless 0 1 1 - 0  Suicidal thoughts 0 0 0 - 0  PHQ-9 Score 10 15 16  - 4  Difficult doing work/chores Not difficult at all - - - -  Some recent data might be hidden   GAD 7 : Generalized Anxiety Score 09/13/2020 09/11/2020 07/30/2020 06/08/2020  Nervous, Anxious, on Edge 3 3 3 2   Control/stop worrying 2 3 3  0  Worry too much - different things 2 3 3  0  Trouble relaxing 3 3 3 2   Restless 1 2 2  0  Easily annoyed or irritable 1 1 2 1   Afraid - awful might happen 2 0 2 0  Total GAD 7 Score 14 15 18 5   Anxiety Difficulty - - - Somewhat difficult

## 2020-09-26 ENCOUNTER — Ambulatory Visit (INDEPENDENT_AMBULATORY_CARE_PROVIDER_SITE_OTHER): Payer: Medicaid Other | Admitting: Clinical

## 2020-09-26 DIAGNOSIS — F419 Anxiety disorder, unspecified: Secondary | ICD-10-CM

## 2020-09-26 DIAGNOSIS — Z1331 Encounter for screening for depression: Secondary | ICD-10-CM

## 2020-09-26 DIAGNOSIS — F4323 Adjustment disorder with mixed anxiety and depressed mood: Secondary | ICD-10-CM

## 2020-09-26 DIAGNOSIS — F4321 Adjustment disorder with depressed mood: Secondary | ICD-10-CM

## 2020-09-26 NOTE — Patient Instructions (Signed)
Center for [facility] Healthcare at [facility] for Women [place], [place] [address] [phone] (main office) [phone] ([place] office)  [place] and Websites Here are a few free apps meant to help you to help yourself.  To find, try searching on the internet to see if the app is offered on Apple/Android devices. If your first choice doesn't come up on your device, the good news is that there are many choices! Play around with different apps to see which ones are helpful to you.    Calm This is an app meant to help increase calm feelings. Includes info, strategies, and tools for tracking your feelings.      Calm Harm  This app is meant to help with self-harm. Provides many 5-minute or 15-min coping strategies for doing instead of hurting yourself.       [place] is a problem-solving tool to help deal with emotions and cope with stress you encounter wherever you are.      MindShift This app can help people cope with anxiety. Rather than trying to avoid anxiety, you can make an important shift and face it.      MY3  MY3 features a support system, safety plan and resources with the goal of offering a tool to use in a time of need.       My Life My Voice  This mood journal offers a simple solution for tracking your thoughts, feelings and moods. Animated emoticons can help identify your mood.       Relax Melodies Designed to help with sleep, on this app you can mix sounds and meditations for relaxation.      Smiling Mind Smiling Mind is meditation made easy: it's a simple tool that helps put a smile on your mind.        Stop, Breathe & Think  A friendly, simple guide for people through meditations for mindfulness and compassion.  Stop, Breathe and Think Kids Enter your current feelings and choose a "mission" to help you cope. Offers videos for certain moods instead of just sound recordings.       Team  [place] The goal of this tool is to help teens change how they think, act, and react. This app helps you focus on your own good feelings and experiences.      The [place] Box The [place] Box (VHB) contains simple tools to help patients with coping, relaxation, distraction, and positive thinking.    Coping with Panic Attacks   What is a panic attack?  You may have had a panic attack if you experienced four or more of the symptoms listed below coming on abruptly and peaking in about 10 minutes.  Panic Symptoms   . Pounding heart  . Sweating  . Trembling or shaking  . Shortness of breath  . Feeling of choking  . Chest pain  . Nausea or abdominal distress    . Feeling dizzy, unsteady, lightheaded, or faint  . Feelings of unreality or being detached from yourself  . Fear of losing control or going crazy  . Fear of dying  . Numbness or tingling  . Chills or hot flashes      Panic attacks are sometimes accompanied by avoidance of certain places or situations. These are often situations that would be difficult to escape from or in which help might not be available. Examples might include crowded shopping malls, public transportation, restaurants, or driving.   Why  do panic attacks occur?   Panic attacks are the body's alarm system gone awry. All of [place] have a built-in alarm system, powered by adrenaline, which increases our heart rate, breathing, and blood flow in response to danger. Ordinarily, this 'danger response system' works well. In some people, however, the response is either out of proportion to whatever stress is going on, or may come out of the blue without any stress at all.   For example, if you are walking in the woods and see a bear coming your way, a variety of changes occur in your body to prepare you to either fight the danger or flee from the situation. Your heart rate will increase to get more blood flow around your body, your breathing rate will quicken so  that more oxygen is available, and your muscles will tighten in order to be ready to fight or run. You may feel nauseated as blood flow leaves your stomach area and moves into your limbs. These bodily changes are all essential to helping you survive the dangerous situation. After the danger has passed, your body functions will begin to go back to normal. This is because your body also has a system for "recovering" by bringing your body back down to a normal state when the danger is over.   As you can see, the emergency response system is adaptive when there is, in fact, a "true" or "real" danger (e.g., bear). However, sometimes people find that their emergency response system is triggered in "everyday" situations where there really is no true physical danger (e.g., in a meeting, in the grocery store, while driving in normal traffic, etc.).   What triggers a panic attack?  Sometimes particularly stressful situations can trigger a panic attack. For example, an argument with your spouse or stressors at work can cause a stress response (activating the emergency response system) because you perceive it as threatening or overwhelming, even if there is no direct risk to your survival.  Sometimes panic attacks don't seem to be triggered by anything in particular- they may "come out of the blue". Somehow, the natural "fight or flight" emergency response system has gotten activated when there is no real danger. Why does the body go into "emergency mode" when there is no real danger?   Often, people with panic attacks are frightened or alarmed by the physical sensations of the emergency response system. First, unexpected physical sensations are experienced (tightness in your chest or some shortness of breath). This then leads to feeling fearful or alarmed by these symptoms ("Something's wrong!", "Am I having a heart attack?", "Am I going to faint?") The mind perceives that there is a danger even though no real danger  exists. This, in turn, activates the emergency response system ("fight or flight"), leading to a "full blown" panic attack. In summary, panic attacks occur when we misinterpret physical symptoms as signs of impending death, craziness, loss of control, embarrassment, or fear of fear. Sometimes you may be aware of thoughts of danger that activate the emergency response system (for example, thinking "I'm having a heart attack" when you feel chest pressure or increased heart rate). At other times, however, you may not be aware of such thoughts. After several incidences of being afraid of physical sensations, anxiety and panic can occur in response to the initial sensations without conscious thoughts of danger. Instead, you just feel afraid or alarmed. In other words, the panic or fear may seem to occur "automatically" without you consciously telling yourself  anything.   After having had one or more panic attacks, you may also become more focused on what is going on inside your body. You may scan your body and be more vigilant about noticing any symptoms that might signal the start of a panic attack. This makes it easier for panic attacks to happen again because you pick up on sensations you might otherwise not have noticed, and misinterpret them as something dangerous. A panic attack may then result.      How do I cope with panic attacks?  An important part of overcoming panic attacks involves re-interpreting your body's physical reactions and teaching yourself ways to decrease the physical arousal. This can be done through practicing the cognitive and behavioral interventions below.   Research has found that over half of people who have panic attacks show some signs of hyperventilation or overbreathing. This can produce initial sensations that alarm you and lead to a panic attack. Overbreathing can also develop as part of the panic attack and make the symptoms worse. When people hyperventilate, certain blood  vessels in the body become narrower. In particular, the brain may get slightly less oxygen. This can lead to the symptoms of dizziness, confusion, and lightheadedness that often occur during panic attacks. Other parts of the body may also get a bit less oxygen, which may lead to numbness or tingling in the hands or feet or the sensation of cold, clammy hands. It also may lead the heart to pump harder. Although these symptoms may be frightening and feel unpleasant, it is important to remember that hyperventilating is not dangerous. However, you can help overcome the unpleasantness of overbreathing by practicing Breathing Retraining.   Practice this basic technique three times a day, every day:  . Inhale. With your shoulders relaxed, inhale as slowly and deeply as you can while you count to six. If you can, use your diaphragm to fill your lungs with air.  . Hold. Keep the air in your lungs as you slowly count to four.  . Exhale. Slowly breath out as you count to six.  . Repeat. Do the inhale-hold-exhale cycle several times. Each time you do it, exhale for longer counts.  Like any new skill, Breathing Retraining requires practice. Try practicing this skill twice a day for several minutes. Initially, do not try this technique in specific situations or when you become frightened or have a panic attack. Begin by practicing in a quiet environment to build up your skill level so that you can later use it in time of "emergency."   2. Decreasing Avoidance  Regardless of whether you can identify why you began having panic attacks or whether they seemed to come out of the blue, the places where you began having panic attacks often can become triggers themselves. It is not uncommon for individuals to begin to avoid the places where they have had panic attacks. Over time, the individual may begin to avoid more and more places, thereby decreasing their activities and often negatively impacting their quality of life. To  break the cycle of avoidance, it is important to first identify the places or situations that are being avoided, and then to do some "relearning."  To begin this intervention, first create a list of locations or situations that you tend to avoid. Then choose an avoided location or situation that you would like to target first. Now develop an "exposure hierarchy" for this situation or location. An "exposure hierarchy" is a list of actions that make  you feel anxious in this situation. Order these actions from least to most anxiety-producing. It is often helpful to have the first item on your hierarchy involve thinking or imagining part of the feared/avoided situation.   Here is an example of an exposure hierarchy for decreasing avoidance of the grocery store. Note how it is ordered from the least amount of anxiety (at the top) to the most anxiety (at the bottom):  . Think about going to the grocery store alone.  . Go to the grocery store with a friend or family member.  . Go to the grocery store alone to pick up a few small items (5-10 minutes in the store).  . Shopping for 10-20 minutes in the store alone.  . Doing the shopping for the week by myself (20-30 minutes in the store).   Your homework is to "expose" yourself to the lowest item on your hierarchy and use your breathing relaxation and coping statements (see below) to help you remain in the situation. Practice this several times during the upcoming week. Once you have mastered each item with minimal anxiety, move on to the next higher action on your list.   Cognitive Interventions  1. Identify your negative self-talk Anxious thoughts can increase anxiety symptoms and panic. The first step in changing anxious thinking is to identify your own negative, alarming self-talk. Some common alarming thoughts:  . I'm having a heart attack.            . I must be going crazy. . I think I'm dying. . People will think I'm crazy. . I'm going to pass our.   . Oh no- here it comes.  . I can't stand this.  . I've got to get out of here!  2. Use positive coping statements Changing or disrupting a pattern of anxious thoughts by replacing them with more calming or supportive statements can help to divert a panic attack. Some common helpful coping statements:  . This is not an emergency.  . I don't like feeling this way, but I can accept it.  . I can feel like this and still be okay.  . This has happened before, and I was okay. I'll be okay this time, too.  . I can be anxious and still deal with this situation.        

## 2020-09-27 ENCOUNTER — Ambulatory Visit: Payer: Medicaid Other | Admitting: General Surgery

## 2020-09-27 NOTE — BH Specialist Note (Deleted)
Integrated Behavioral Health via Telemedicine Visit  09/27/2020 Leah Baker [id number]  Number of [place] visits: 2 Session Start time: 11:15***  Session End time: 11:45*** Total time: {IBH Total Time:21014050}  Referring Provider: *** Patient/Family location: Home*** [facility] Provider location: Center for [place] at [facility]  All persons participating in visit: Patient *** and [place] ***  Types of Service: {CHL AMB TYPE OF SERVICE:[phone]}  I connected with Leah Baker and/or Leah Baker's {family members:20773} via  Telephone or Video Enabled Telemedicine Application  (Video is Caregility application) and verified that I am speaking with the correct person using two identifiers. Discussed confidentiality: {YES/NO:21197}  I discussed the limitations of telemedicine and the availability of in person appointments.  Discussed there is a possibility of technology failure and discussed alternative modes of communication if that failure occurs.  I discussed that engaging in this telemedicine visit, they consent to the provision of behavioral healthcare and the services will be billed under their insurance.  Patient and/or legal guardian expressed understanding and consented to Telemedicine visit: {YES/NO:21197}  Presenting Concerns: Patient and/or family reports the following symptoms/concerns: *** Duration of problem: ***; Severity of problem: {Mild/Moderate/Severe:20260}  Patient and/or Family's Strengths/Protective Factors: {CHL AMB BH PROTECTIVE FACTORS:[phone]}  Goals Addressed: Patient will: 1.  Reduce symptoms of: {IBH Symptoms:21014056}  2.  Increase knowledge and/or ability of: {IBH Patient Tools:21014057}  3.  Demonstrate ability to: {IBH Goals:21014053}  Progress towards Goals: {CHL AMB BH PROGRESS TOWARDS [phone]}  Interventions: Interventions utilized:  {IBH  Interventions:21014054} Standardized Assessments completed: {IBH Screening Tools:21014051}  Patient and/or Family Response: ***  Assessment: Patient currently experiencing ***.   Patient may benefit from ***.  Plan: 1. Follow up with behavioral health clinician on : *** 2. Behavioral recommendations: *** 3. Referral(s): {IBH Referrals:21014055}  I discussed the assessment and treatment plan with the patient and/or parent/guardian. They were provided an opportunity to ask questions and all were answered. They agreed with the plan and demonstrated an understanding of the instructions.   They were advised to call back or seek an in-person evaluation if the symptoms worsen or if the condition fails to improve as anticipated.  Caroleen Hamman Angelise Petrich, LCSW

## 2020-10-09 ENCOUNTER — Ambulatory Visit: Payer: Medicaid Other | Admitting: General Surgery

## 2020-10-19 ENCOUNTER — Ambulatory Visit: Payer: Medicaid Other | Admitting: Family Medicine

## 2020-11-05 ENCOUNTER — Telehealth: Payer: Self-pay | Admitting: Clinical

## 2020-11-05 NOTE — Telephone Encounter (Signed)
Attempt to f/u with pt; Left HIPPA-compliant message to call back Roselyn Reef from [employer] for [employer] at [facility] for Women at  [phone] [facility] office).

## 2020-11-28 ENCOUNTER — Telehealth: Payer: Self-pay | Admitting: Hematology

## 2020-11-28 NOTE — Telephone Encounter (Signed)
Rescheduled upcoming appointment due to provider not in office. Patient is aware of changes. 

## 2020-11-30 ENCOUNTER — Ambulatory Visit: Payer: Medicaid Other | Admitting: Hematology

## 2020-11-30 ENCOUNTER — Other Ambulatory Visit: Payer: Medicaid Other

## 2020-12-06 ENCOUNTER — Telehealth (INDEPENDENT_AMBULATORY_CARE_PROVIDER_SITE_OTHER): Payer: Medicaid Other | Admitting: Family Medicine

## 2020-12-06 ENCOUNTER — Encounter: Payer: Self-pay | Admitting: Family Medicine

## 2020-12-06 DIAGNOSIS — N921 Excessive and frequent menstruation with irregular cycle: Secondary | ICD-10-CM | POA: Diagnosis not present

## 2020-12-06 DIAGNOSIS — F430 Acute stress reaction: Secondary | ICD-10-CM

## 2020-12-06 DIAGNOSIS — F411 Generalized anxiety disorder: Secondary | ICD-10-CM | POA: Diagnosis not present

## 2020-12-06 DIAGNOSIS — F329 Major depressive disorder, single episode, unspecified: Secondary | ICD-10-CM | POA: Diagnosis not present

## 2020-12-06 DIAGNOSIS — F39 Unspecified mood [affective] disorder: Secondary | ICD-10-CM | POA: Diagnosis not present

## 2020-12-06 MED ORDER — SERTRALINE HCL 100 MG PO TABS: 100.0000 mg | ORAL_TABLET | Freq: Every day | ORAL | 3 refills | Status: DC

## 2020-12-06 NOTE — Assessment & Plan Note (Signed)
Continue Zoloft--consider dose increase to 150 mg if needed. Try Melatonin or Tylenol PM for help with sleeping.

## 2020-12-06 NOTE — Progress Notes (Signed)
GYNECOLOGY VIRTUAL VISIT ENCOUNTER NOTE  Provider location: [facility]   Patient location: Home  I connected with Leah Baker on 12/06/20 at 10:55 AM EDT by MyChart Video Encounter and verified that I am speaking with the correct person using two identifiers.   I discussed the limitations, risks, security and privacy concerns of performing an evaluation and management service virtually and the availability of in person appointments. I also discussed with the patient that there may be a patient responsible charge related to this service. The patient expressed understanding and agreed to proceed.   History:  Leah Baker is a 40 y.o. [phone] female being evaluated today for abnormal bleeding. She is on Aygestin and not having any bleeding. We resumed her Zoloft and she had been on a high dose, and then had side effects from that. Now down to 100 mg and tolerating well. She reports early am rising possibly related to anxiety. She continues to have family stressors. Her dad is in the hospital at [facility]. She denies any abnormal vaginal discharge, pelvic pain or other concerns.       Past Medical History:  Diagnosis Date   Abnormal uterine bleeding (AUB)    Anemia    Phreesia 06/08/2020   Anxiety    Blood in stool last 2 years   Depression    Depression    Phreesia 06/08/2020   Hemorrhoids    History of blood transfusion 05/2019 2 units   iron transfusion given also   History of lower GI bleeding dec 2021 active problem   s/p  colonoscopy 09-01-2018,  hemorrhoids   IDA (iron deficiency anemia)    Inguinal lymphadenopathy    CT 09-14-2018  right side, followed by pcp   PONV (postoperative nausea and vomiting)    only happened once   Wears glasses    Wears glasses    Past Surgical History:  Procedure Laterality Date   BIOPSY  09/01/2018   Procedure: BIOPSY;  Surgeon: Otis Brace, MD;  Location: [facility];  Service:  Gastroenterology;;   CESAREAN SECTION  01/15/2002   @[facility]    CESAREAN SECTION  03/17/2011   Procedure: CESAREAN SECTION;  Surgeon: Osborne Oman, MD;  Location: [facility];  Service: Gynecology;  Laterality: N/A;   COLONOSCOPY WITH PROPOFOL N/A 09/01/2018   Procedure: COLONOSCOPY WITH PROPOFOL;  Surgeon: Otis Brace, MD;  Location: [place];  Service: Gastroenterology;  Laterality: N/A;   DILITATION & CURRETTAGE/HYSTROSCOPY WITH HYDROTHERMAL ABLATION N/A 08/22/2020   Procedure: DILATATION & CURETTAGE/HYSTEROSCOPY CERVICAL REPAIR;  Surgeon: [name], MD;  Location: [facility];  Service: Gynecology;  Laterality: N/A;   HYSTEROSCOPY WITH D & C  06-21-2009  dr constant @[facility]    w/  REMOVAL IUD    INGUINAL LYMPH NODE BIOPSY Right 04/12/2020   Procedure: RIGHT INGUINAL LYMPH NODE EXCISIONAL BIOPSY;  Surgeon: Stark Klein, MD;  Location: [facility];  Service: General;  Laterality: Right;   IUD REMOVAL  06/21/2009   KNEE ARTHROSCOPY WITH ANTERIOR CRUCIATE LIGAMENT (ACL) REPAIR Left 09-08-2013   dr [name]  @[facility]    w/  meniscal repair   MULTIPLE TOOTH EXTRACTIONS  2012   RECTAL EXAM UNDER ANESTHESIA N/A 11/18/2018   Procedure: ANORECTAL EXAM UNDER ANESTHESIA;  Surgeon: Ileana Roup, MD;  Location: [facility];  Service: General;  Laterality: N/A;   TUBAL LIGATION Bilateral 2012   The following portions of the patient's history were reviewed and updated  as appropriate: allergies, current medications, past family history, past medical history, past social history, past surgical history and problem list.   Health Maintenance:  Normal pap and negative HRHPV on 12/07/2018.  Normal mammogram on 03/2019.   Review of Systems:  Pertinent items noted in HPI and remainder of comprehensive ROS otherwise negative.  Physical Exam:   General:  Alert, oriented and cooperative. Patient appears to be in no acute distress.  Mental Status: Normal mood and affect. Normal behavior.  Normal judgment and thought content.   Respiratory: Normal respiratory effort, no problems with respiration noted  Rest of physical exam deferred due to type of encounter     Assessment and Plan:   Problem List Items Addressed This Visit       Unprioritized   Depression   Relevant Medications   sertraline (ZOLOFT) 100 MG tablet   Mood disorder (HCC)    Continue Zoloft--consider dose increase to 150 mg if needed. Try Melatonin or Tylenol PM for help with sleeping.       Menorrhagia - Primary    Continue Aygestin--working well.       Other Visit Diagnoses     Anxiety as acute reaction to exceptional stress       Relevant Medications   sertraline (ZOLOFT) 100 MG tablet            I discussed the assessment and treatment plan with the patient. The patient was provided an opportunity to ask questions and all were answered. The patient agreed with the plan and demonstrated an understanding of the instructions.   The patient was advised to call back or seek an in-person evaluation/go to the ED if the symptoms worsen or if the condition fails to improve as anticipated.  I provided 12 minutes of face-to-face time during this encounter.   [name], MD [facility] [place]

## 2020-12-06 NOTE — Assessment & Plan Note (Signed)
Continue Aygestin--working well.

## 2020-12-07 ENCOUNTER — Other Ambulatory Visit: Payer: Self-pay | Admitting: Critical Care Medicine

## 2020-12-07 DIAGNOSIS — Z1231 Encounter for screening mammogram for malignant neoplasm of breast: Secondary | ICD-10-CM

## 2020-12-10 ENCOUNTER — Telehealth: Payer: Self-pay | Admitting: Hematology

## 2020-12-10 ENCOUNTER — Other Ambulatory Visit: Payer: Medicaid Other

## 2020-12-10 ENCOUNTER — Encounter: Payer: Self-pay | Admitting: Hematology

## 2020-12-10 ENCOUNTER — Ambulatory Visit: Payer: Medicaid Other | Admitting: Hematology

## 2020-12-10 NOTE — Telephone Encounter (Signed)
Scheduled appointment per 07/11 sch msg. Left message.

## 2020-12-10 NOTE — Telephone Encounter (Signed)
Received a sch msg to r/s Leah Baker appt for today due to Covid exposure. I lft her a vm to r/s.

## 2020-12-11 ENCOUNTER — Ambulatory Visit: Payer: Medicaid Other | Admitting: Critical Care Medicine

## 2020-12-11 NOTE — Telephone Encounter (Unsigned)
Called pt and will give po antiviral if covid test is postive

## 2021-01-05 DIAGNOSIS — Z20822 Contact with and (suspected) exposure to covid-19: Secondary | ICD-10-CM | POA: Diagnosis not present

## 2021-01-07 ENCOUNTER — Other Ambulatory Visit: Payer: Medicaid Other

## 2021-01-07 ENCOUNTER — Telehealth: Payer: Self-pay | Admitting: Hematology

## 2021-01-07 ENCOUNTER — Encounter: Payer: Self-pay | Admitting: Hematology

## 2021-01-07 ENCOUNTER — Ambulatory Visit: Payer: Medicaid Other | Admitting: Hematology

## 2021-01-07 NOTE — Telephone Encounter (Signed)
Left message with today's rescheduled appointment due to provider's emergency.

## 2021-01-12 DIAGNOSIS — Z20822 Contact with and (suspected) exposure to covid-19: Secondary | ICD-10-CM | POA: Diagnosis not present

## 2021-01-22 ENCOUNTER — Telehealth: Payer: Self-pay | Admitting: Hematology

## 2021-01-22 NOTE — Progress Notes (Signed)
Pt has an appointment tomorrow. Pt's daughter tested positive for COVID. Pt had positive test last week (01/14/21). Pt to reschedule some time after 02/04/21.

## 2021-01-22 NOTE — Telephone Encounter (Signed)
R/s appt per 8/23 sch msg. Pt aware.

## 2021-01-23 ENCOUNTER — Ambulatory Visit: Payer: Medicaid Other | Admitting: Hematology

## 2021-01-23 ENCOUNTER — Other Ambulatory Visit: Payer: Medicaid Other

## 2021-01-24 IMAGING — CT CT ANGIOGRAPHY CHEST
2 of 7 series · 18 of 46 positions shown · IV contrast (APPLIED)
Comparison: Chest radiograph November 08, 2018

CLINICAL DATA: Chest pain

EXAM:
CT ANGIOGRAPHY CHEST WITH CONTRAST
TECHNIQUE: Multidetector CT imaging of the chest was performed using the
standard protocol during bolus administration of intravenous
contrast. Multiplanar CT image reconstructions and MIPs were
obtained to evaluate the vascular anatomy.
CONTRAST:  80mL OMNIPAQUE IOHEXOL 350 MG/ML SOLN

[Series 7: thins · axial · 0.61mm/px · z∈[+1284,+1484]mm · 15 of 322 slices shown]
[im 18/322  lung]
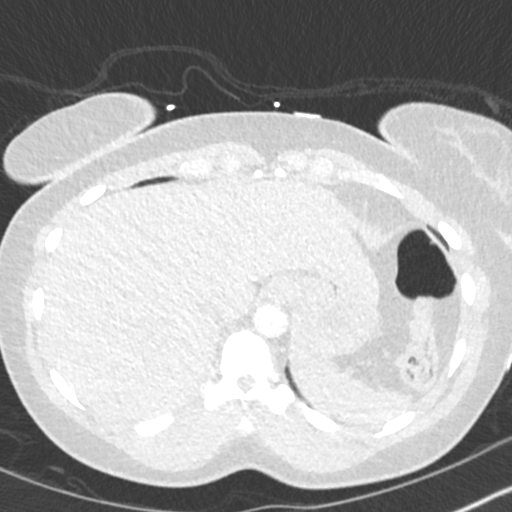
[im 36/322  soft-tissue]
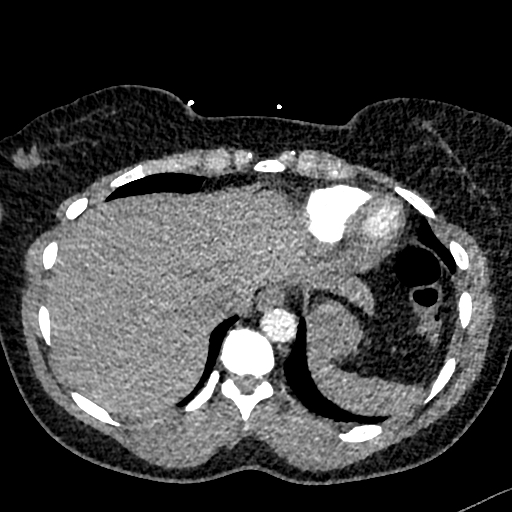
[im 54/322  lung]
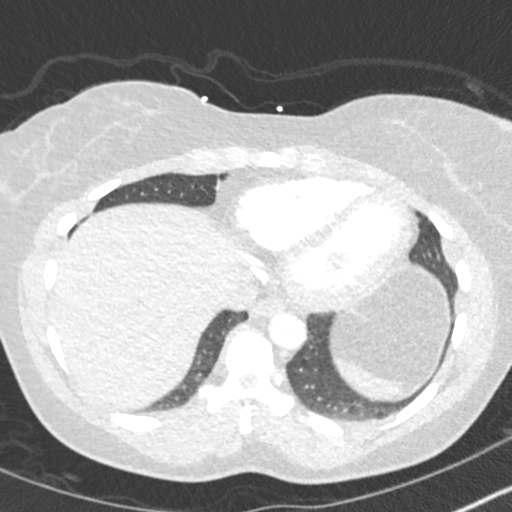
[im 72/322  soft-tissue]
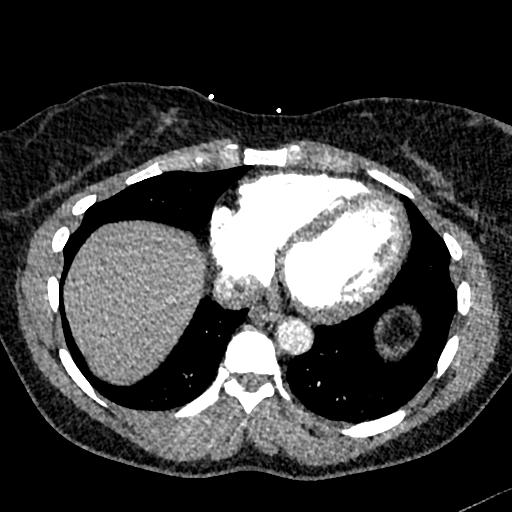
[im 108/322  lung]
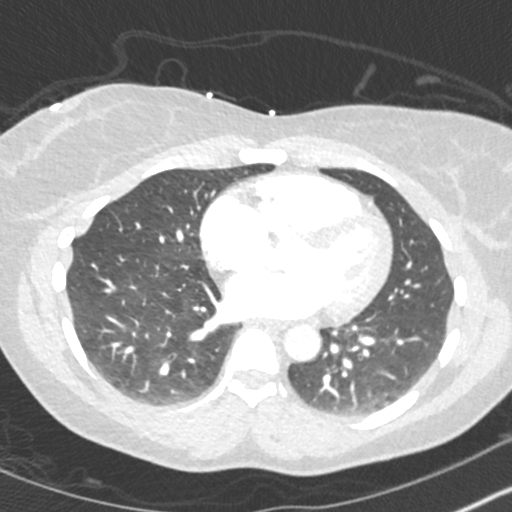
[im 125/322  soft-tissue]
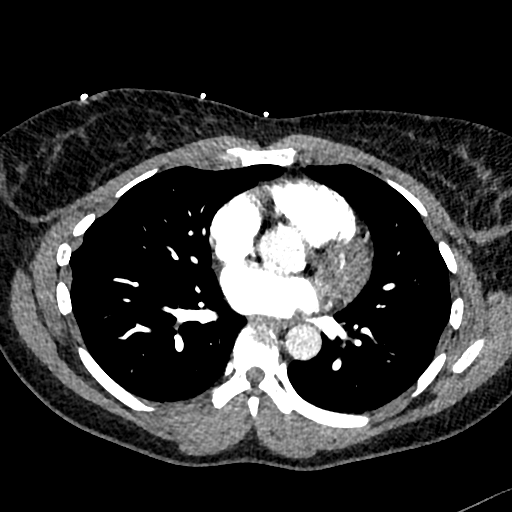
[im 143/322  lung]
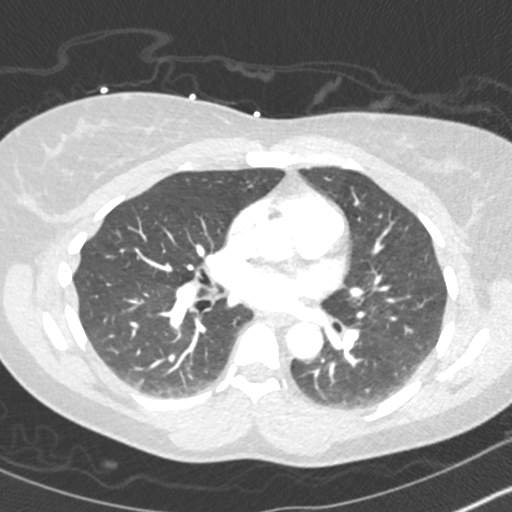
[im 161/322  soft-tissue]
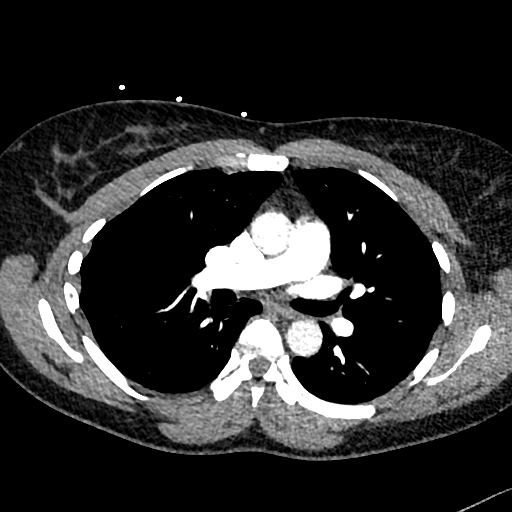
[im 179/322  lung]
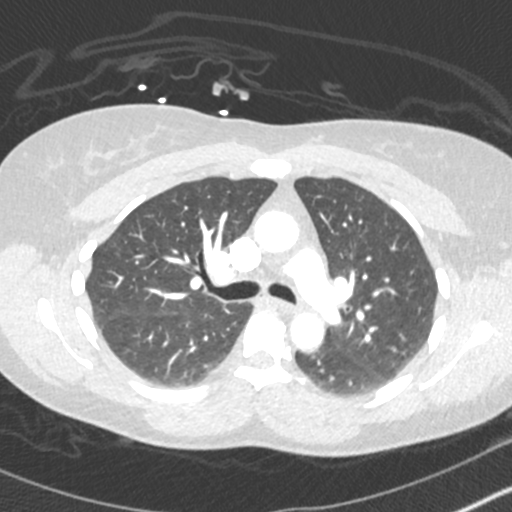
[im 197/322  soft-tissue]
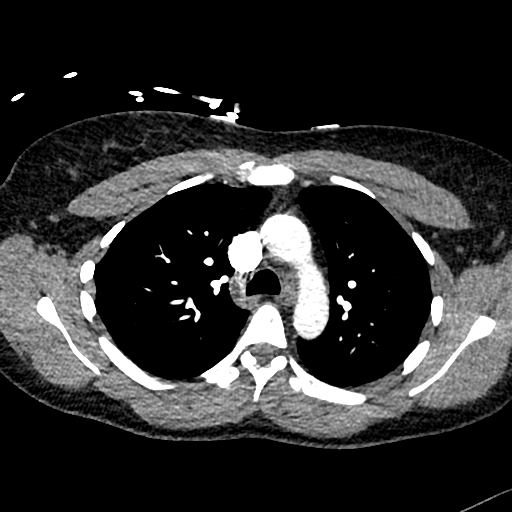
[im 215/322  lung]
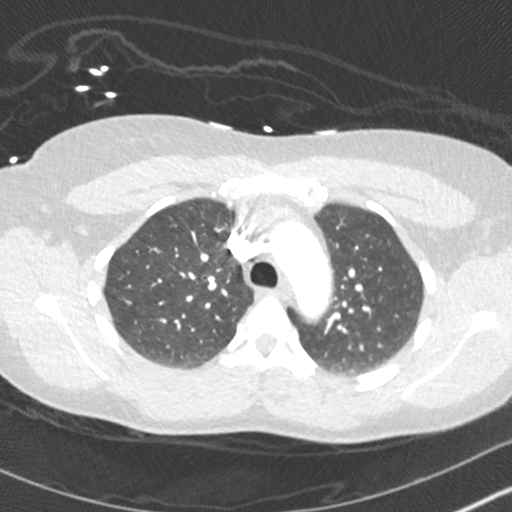
[im 250/322  soft-tissue]
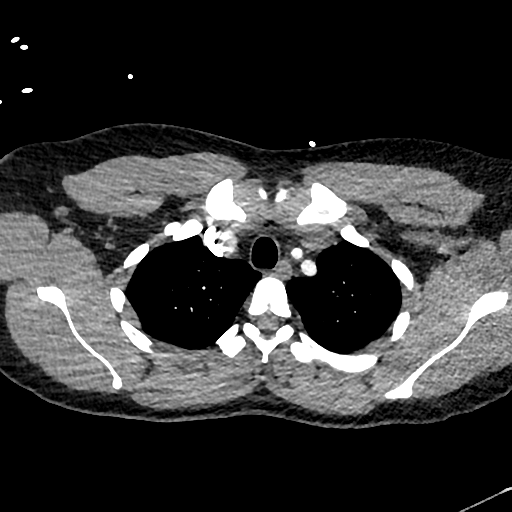
[im 268/322  lung]
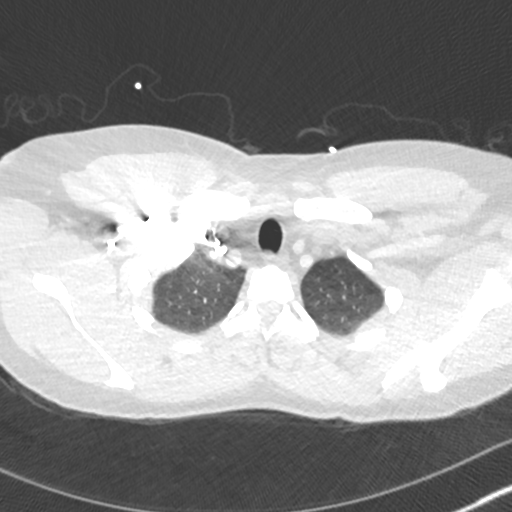
[im 286/322  soft-tissue]
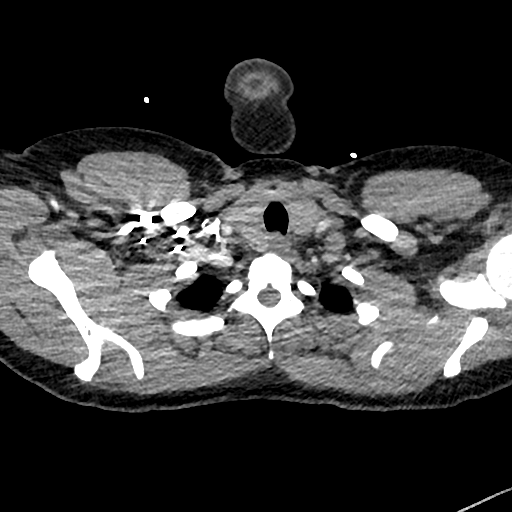
[im 304/322  lung]
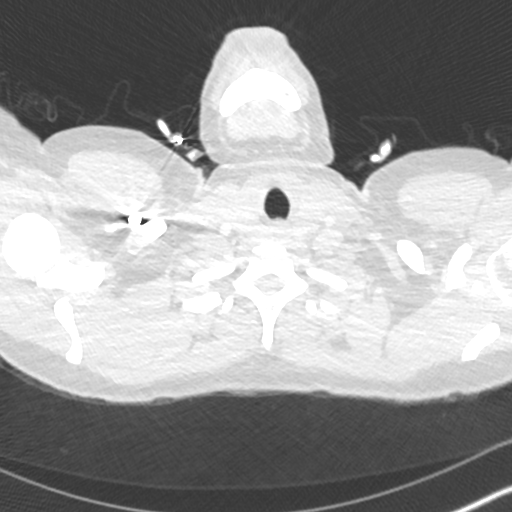

[Series 8: cor · coronal · 0.47mm/px · 3 of 118 slices shown]
[im 30/118  soft-tissue]
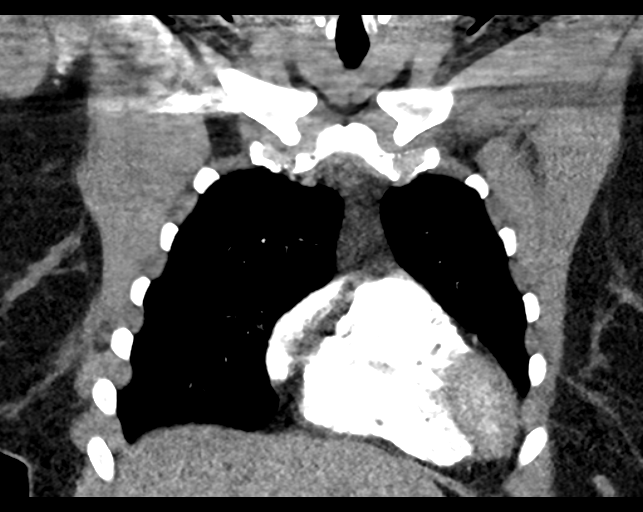
[im 59/118  soft-tissue]
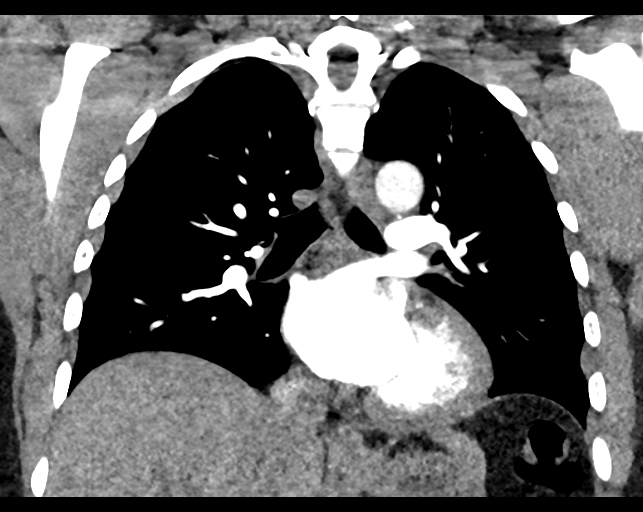
[im 88/118  soft-tissue]
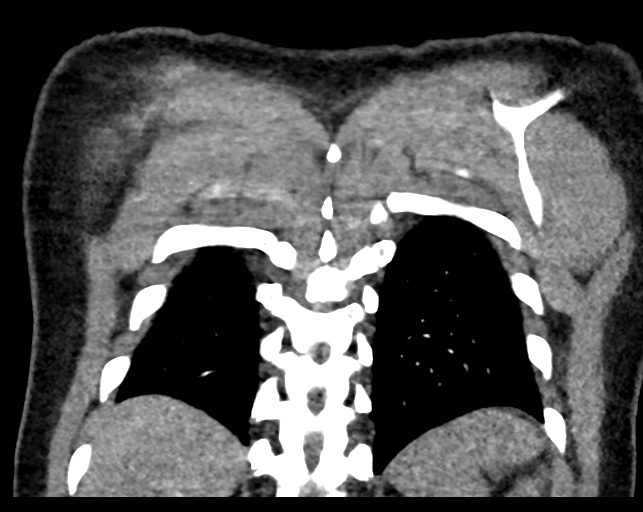

[18 of 46 positions shown; findings below may reference images not displayed]

FINDINGS: Cardiovascular: There is no demonstrable pulmonary embolus. There is
no thoracic aortic aneurysm or dissection. Visualized great vessels
appear normal. There is no pericardial effusion or pericardial
thickening.

Mediastinum/Nodes: Visualized thyroid appears normal. There is no
appreciable thoracic adenopathy. No esophageal lesions are evident.

Lungs/Pleura: There is mild lower lobe atelectatic change
bilaterally. There is no edema or consolidation. There is no
demonstrable pleural effusion or pleural thickening.

Upper Abdomen: Visualized upper abdominal structures appear
unremarkable.

Musculoskeletal: There are no blastic or lytic bone lesions. There
are no appreciable chest wall lesions.

Review of the MIP images confirms the above findings.
IMPRESSION: 1. There is no demonstrable pulmonary embolus. No thoracic aortic
aneurysm or dissection.

2. Mild lower lobe atelectatic change. No edema or consolidation. No
pleural effusion.

3.  No demonstrable thoracic adenopathy.

## 2021-02-07 ENCOUNTER — Inpatient Hospital Stay (HOSPITAL_BASED_OUTPATIENT_CLINIC_OR_DEPARTMENT_OTHER): Payer: Medicaid Other | Admitting: Hematology

## 2021-02-07 ENCOUNTER — Inpatient Hospital Stay: Payer: Medicaid Other | Attending: Hematology

## 2021-02-07 ENCOUNTER — Other Ambulatory Visit: Payer: Self-pay

## 2021-02-07 VITALS — BP 132/93 | HR 79 | Temp 98.2°F | Resp 20 | Wt 174.2 lb

## 2021-02-07 DIAGNOSIS — D75839 Thrombocytosis, unspecified: Secondary | ICD-10-CM | POA: Diagnosis present

## 2021-02-07 DIAGNOSIS — D509 Iron deficiency anemia, unspecified: Secondary | ICD-10-CM | POA: Diagnosis not present

## 2021-02-07 DIAGNOSIS — D649 Anemia, unspecified: Secondary | ICD-10-CM | POA: Diagnosis not present

## 2021-02-07 DIAGNOSIS — R599 Enlarged lymph nodes, unspecified: Secondary | ICD-10-CM

## 2021-02-07 DIAGNOSIS — Z87891 Personal history of nicotine dependence: Secondary | ICD-10-CM | POA: Insufficient documentation

## 2021-02-07 LAB — CMP (CANCER CENTER ONLY)
ALT: 9 U/L (ref 0–44)
AST: 16 U/L (ref 15–41)
Albumin: 4 g/dL (ref 3.5–5.0)
Alkaline Phosphatase: 49 U/L (ref 38–126)
Anion gap: 12 (ref 5–15)
BUN: 11 mg/dL (ref 6–20)
CO2: 18 mmol/L — ABNORMAL LOW (ref 22–32)
Calcium: 9.1 mg/dL (ref 8.9–10.3)
Chloride: 110 mmol/L (ref 98–111)
Creatinine: 1.04 mg/dL — ABNORMAL HIGH (ref 0.44–1.00)
GFR, Estimated: >60 mL/min (ref >60)
Glucose, Bld: 124 mg/dL — ABNORMAL HIGH (ref 70–99)
Potassium: 3.5 mmol/L (ref 3.5–5.1)
Sodium: 140 mmol/L (ref 135–145)
Total Bilirubin: 0.3 mg/dL (ref 0.3–1.2)
Total Protein: 7.4 g/dL (ref 6.5–8.1)

## 2021-02-07 LAB — CBC WITH DIFFERENTIAL/PLATELET
Abs Immature Granulocytes: 0.02 10*3/uL (ref 0.00–0.07)
Basophils Absolute: 0.1 10*3/uL (ref 0.0–0.1)
Basophils Relative: 1 %
Eosinophils Absolute: 0.2 10*3/uL (ref 0.0–0.5)
Eosinophils Relative: 3 %
HCT: 36.7 % (ref 36.0–46.0)
Hemoglobin: 11.8 g/dL — ABNORMAL LOW (ref 12.0–15.0)
Immature Granulocytes: 0 %
Lymphocytes Relative: 29 %
Lymphs Abs: 1.8 10*3/uL (ref 0.7–4.0)
MCH: 26.2 pg (ref 26.0–34.0)
MCHC: 32.2 g/dL (ref 30.0–36.0)
MCV: 81.4 fL (ref 80.0–100.0)
Monocytes Absolute: 0.5 10*3/uL (ref 0.1–1.0)
Monocytes Relative: 8 %
Neutro Abs: 3.8 10*3/uL (ref 1.7–7.7)
Neutrophils Relative %: 59 %
Platelets: 454 10*3/uL — ABNORMAL HIGH (ref 150–400)
RBC: 4.51 MIL/uL (ref 3.87–5.11)
RDW: 19.1 % — ABNORMAL HIGH (ref 11.5–15.5)
WBC: 6.4 10*3/uL (ref 4.0–10.5)
nRBC: 0 % (ref 0.0–0.2)

## 2021-02-07 LAB — IRON AND TIBC
Iron: 55 ug/dL (ref 41–142)
Saturation Ratios: 9 % — ABNORMAL LOW (ref 21–57)
TIBC: 591 ug/dL — ABNORMAL HIGH (ref 236–444)
UIBC: 536 ug/dL — ABNORMAL HIGH (ref 120–384)

## 2021-02-07 LAB — FERRITIN: Ferritin: 6 ng/mL — ABNORMAL LOW (ref 11–307)

## 2021-02-08 ENCOUNTER — Telehealth: Payer: Self-pay | Admitting: Hematology

## 2021-02-08 NOTE — Telephone Encounter (Signed)
Scheduled appt per 9/8 LOS - mailed letter with appt date and time   

## 2021-02-11 ENCOUNTER — Ambulatory Visit
Admission: RE | Admit: 2021-02-11 | Discharge: 2021-02-11 | Disposition: A | Discharge status: Home / Self Care | Payer: Medicaid Other | Source: Ambulatory Visit | Attending: Critical Care Medicine | Admitting: Critical Care Medicine

## 2021-02-11 ENCOUNTER — Other Ambulatory Visit: Payer: Self-pay

## 2021-02-11 DIAGNOSIS — Z1231 Encounter for screening mammogram for malignant neoplasm of breast: Secondary | ICD-10-CM

## 2021-02-13 NOTE — Progress Notes (Signed)
HEMATOLOGY/ONCOLOGY CLINIC NOTE  Date of Service: .02/07/2021  Patient Care Team: Elsie Stain, MD as PCP - General (Pulmonary Disease)  CHIEF COMPLAINTS/PURPOSE OF CONSULTATION:  IDA  HISTORY OF PRESENTING ILLNESS:   Leah Baker is a wonderful 40 y.o. female who has been referred to [place] by Dr Denman George for evaluation and management of pelvic lymphadenopathy. The pt reports that she is doing well overall.   The pt reports that her pelvic lymphadenopathy was found incidentally while she was being checked for other abdominal pains. In February pt was seeing blood in her stools and had to be hospitalized due to severe blood loss. Pt's 04/20 Colonoscopy revealed that she did have internal hemorrhoids that were bleeding. Dr. Alessandra Bevels did not think that the pt needed an endoscopy because they believe that her internal hemorrhoid is the sole source of her GI bleeding. She also has a history of menorrhagia, for which she has had to have multiple blood transfusions. Dr. Denman George started her on Megace in July, her dosage has since been increased. Pt's pelvic region is sensitive to touch and the pain is constant. She reports that the pain is centralized in her hip and does not feel like it's going down her leg. Pt denies right leg swelling or injuries to the right side/leg. It does not hurt when she lifts her leg up, down or out. She does note mild pain when she moves her leg in, towards the center of her body. Heat helps ease the pain, so she often walks around with a heating pad and intercourse makes the pain worse. Dr. [name] has given her no indication of what is causing her pain. Pt also experiences non-radiating lower back pain that could be related. Pt had a chest CT in 11/2018 due to chest pains after she was moving heavy objects around but nothing of note was found. Her chest pains have since resolved. Pt reports some hematuria but denies any history of UTIs. She is planning on seeing a  Urologist soon. She has no family history of any autoimmune conditions but does have a large family history of cancer.   Pt can not take Ibuprofen due to bleeding concerns so she has had to take several different pain medications. She was last on Vicodin which helped but she has since run out. Dr. Denman George was trying to discern if most of her pain was due to her menstrual cramps and wanted to do an endometrial ablation but was told to wait until more is discovered about pt's inguinal lymphadenopathy. Pt has been taking Ferrous Sulfate 4x per day and Metamucil. Pt has no issues passing her bowels. Pt was given Amoxicillin & another antibiotic for about 7 days. She was given no reason why they were giving her antibiotics and it did not help her pain.   She reports that she has had a low-grade fever since Thursday and has had very bad night sweats for the past month. The night sweats appear to be due to the increase in her Megace dosing. She has not had any abnormal vaginal discharge or any boils or bumps in her right pelvic region. Pt has not noticed any lymph node growth or discomfort in the cervical region. She has been helping her children with their at-home schooling and has some new stress due to her father's medical concerns.   Of note prior to the patient's visit today, pt has had Pelvis MRI ([id number]) completed on 02/22/2019 with results revealing "Persistent right inguinal  and iliac adenopathy, previously biopsied and within 1-2 mm of size when compared to 09/14/2018. Significance uncertain given unilateral enlargement, unusual for pelvic nodes with reactive changes. Imaging appearance remains nonspecific but again raises the question of lymphoproliferative disorder. Correlate with previous biopsy results with imaging and clinical follow-up as warranted. Mass on ultrasound likely represented pelvic sidewall/external iliac nodal enlargement on the right."   Pt has had US Pelvic ([id number]) completed on  12/16/2018 with results revealing "1. RIGHT adnexal mass, discrete from the RIGHT ovary is 1.9 centimeters and avascular on Doppler evaluation. This likely represents enlarged RIGHT external iliac lymph node as seen on previous CT exams of the abdomen and pelvis 09/14/2018 and 08/31/2018. 2. Given the persistence of RIGHT iliac lymph node enlargement, consider biopsy of RIGHT inguinal lymph node, seen on prior CT exam. 3. Normal endometrial thickness. If bleeding remains unresponsive to hormonal or medical therapy, sonohysterogram should be considered for focal lesion work-up. (Ref: Radiological Reasoning: Algorithmic  Workup of Abnormal Vaginal Bleeding with Endovaginal Sonography and Sonohysterography. AJR [id number])."  Pt has had Lymph node biopsy (right groin) [id number]) completed on 09/29/2018 with results revealing "LYMPHOID HYPERPLASIA."  Most recent lab results (02/10/2019) of CBC w/diff is as follows: all values are WNL except for RDW at 18.3, PLTs at 457K. 12/08/2018 BMP is as follows: all values are WNL except for CO2 at 18.   On review of systems, pt reports fevers, night sweats, pelvic pain and denies chills, SOB, coughing, runny nose, bloody stools, new lumps/bumps, abdominal pain, leg swelling, calf pain and any other symptoms.   On PMHx the pt reports menorrhagia, lower GI bleeding On Family Hx the pt reports her great aunt passed from liver cancer, her cousin currently has lung cancer, her mother had a mass in her breast (Removed)  INTERVAL HISTORY  Leah Baker is a wonderful 40 y.o. female who is here today for evaluation and management of iron deficiency. The patient's last visit with [place] was on 08/03/2020.. The pt reports that she is doing well overall.  Patient notes she had covid infection a couple of months ago. Denies any overt Gi bleeding. No fatigue, no lightheadedness or dizzineess.  No fevers/chills/night sweats, unexpected weight loss.  Lab results today -  cbc/diff stable hgb 11.8with MCV of 81.4, plt 454k CMP- unremarkable Ferritin - 6  On review of systems, pt reports ice cravings, fatigue, GI bleeding, blood in urine, cold intolerance, night sweats and denies abdominal pain, back pain, leg swelling, fevers, chills, and any other symptoms. Iron saturation 9%   MEDICAL HISTORY:  Past Medical History:  Diagnosis Date   Abnormal uterine bleeding (AUB)    Anemia    Phreesia 06/08/2020   Anxiety    Blood in stool last 2 years   Depression    Depression    Phreesia 06/08/2020   Hemorrhoids    History of blood transfusion 05/2019 2 units   iron transfusion given also   History of lower GI bleeding dec 2021 active problem   s/p  colonoscopy 09-01-2018,  hemorrhoids   IDA (iron deficiency anemia)    Inguinal lymphadenopathy    CT 09-14-2018  right side, followed by pcp   PONV (postoperative nausea and vomiting)    only happened once   Wears glasses    Wears glasses     SURGICAL HISTORY: Past Surgical History:  Procedure Laterality Date   BIOPSY  09/01/2018   Procedure: BIOPSY;  Surgeon: Otis Brace, MD;  Location: [facility];  Service: Gastroenterology;;   CESAREAN SECTION  01/15/2002   '@[facility]'$    CESAREAN SECTION  03/17/2011   Procedure: CESAREAN SECTION;  Surgeon: Osborne Oman, MD;  Location: [facility];  Service: Gynecology;  Laterality: N/A;   COLONOSCOPY WITH PROPOFOL N/A 09/01/2018   Procedure: COLONOSCOPY WITH PROPOFOL;  Surgeon: Otis Brace, MD;  Location: [facility];  Service: Gastroenterology;  Laterality: N/A;   DILITATION & CURRETTAGE/HYSTROSCOPY WITH HYDROTHERMAL ABLATION N/A 08/22/2020   Procedure: DILATATION & CURETTAGE/HYSTEROSCOPY CERVICAL REPAIR;  Surgeon: [name], MD;  Location: [facility];  Service: Gynecology;  Laterality: N/A;   HYSTEROSCOPY WITH D & C  06-21-2009  dr constant '@[facility]'$    w/  REMOVAL IUD    INGUINAL LYMPH NODE BIOPSY Right 04/12/2020   Procedure: RIGHT INGUINAL LYMPH  NODE EXCISIONAL BIOPSY;  Surgeon: Stark Klein, MD;  Location: [facility];  Service: General;  Laterality: Right;   IUD REMOVAL  06/21/2009   KNEE ARTHROSCOPY WITH ANTERIOR CRUCIATE LIGAMENT (ACL) REPAIR Left 09-08-2013   dr [name]  '@[facility]'$    w/  meniscal repair   MULTIPLE TOOTH EXTRACTIONS  2012   RECTAL EXAM UNDER ANESTHESIA N/A 11/18/2018   Procedure: ANORECTAL EXAM UNDER ANESTHESIA;  Surgeon: Ileana Roup, MD;  Location: [facility];  Service: General;  Laterality: N/A;   TUBAL LIGATION Bilateral 2012    SOCIAL HISTORY: Social History   Socioeconomic History   Marital status: Significant Other    Spouse name: Not on file   Number of children: Not on file   Years of education: Not on file   Highest education level: Not on file  Occupational History   Not on file  Tobacco Use   Smoking status: Former    Years: 5.00    Types: Cigarettes    Quit date: 08/22/2008    Years since quitting: 12.4   Smokeless tobacco: Never   Tobacco comments:    social  Vaping Use   Vaping Use: Never used  Substance and Sexual Activity   Alcohol use: Not Currently   Drug use: Not Currently    Comment: per pt younger in college marijuana, none since   Sexual activity: Yes    Birth control/protection: Surgical  Other Topics Concern   Not on file  Social History Narrative   Not on file   Social Determinants of Health   Financial Resource Strain: Not on file  Food Insecurity: Not on file  Transportation Needs: Not on file  Physical Activity: Not on file  Stress: Not on file  Social Connections: Not on file  Intimate Partner Violence: Not on file    FAMILY HISTORY: Family History  Problem Relation Age of Onset   Cancer Mother    Kidney failure Mother    Healthy Father     ALLERGIES:  is allergic to latex.  MEDICATIONS:  Current Outpatient Medications  Medication Sig Dispense Refill   acetaminophen (TYLENOL) 500 MG tablet Take 1 tablet (500 mg total) by mouth  every 6 (six) hours as needed. 30 tablet 0   Ferrous Sulfate (IRON) 325 (65 Fe) MG TABS Take by mouth in the morning and at bedtime.     Menthol-Camphor (TIGER BALM ARTHRITIS RUB EX) Apply 1 application topically daily as needed (knee pain).     norethindrone (AYGESTIN) 5 MG tablet Take 3 tablets (15 mg total) by mouth daily. 90 tablet 3   sertraline (ZOLOFT) 100 MG tablet Take 1 tablet (100 mg total) by mouth at bedtime. [place]  tablet 3   No current facility-administered medications for this visit.   Facility-Administered Medications Ordered in Other Visits  Medication Dose Route Frequency Provider Last Rate Last Admin   bupivacaine liposome (EXPAREL) 1.3 % injection 266 mg  20 mL Infiltration Once Stark Klein, MD        REVIEW OF SYSTEMS:   .10 Point review of Systems was done is negative except as noted above.   PHYSICAL EXAMINATION: ECOG PERFORMANCE STATUS: 2 - Symptomatic, <50% confined to bed  . Vitals:   02/07/21 0925  BP: (!) 132/93  Pulse: 79  Resp: 20  Temp: 98.2 F (36.8 C)  SpO2: 100%   Filed Weights   02/07/21 0925  Weight: 174 lb 3.2 oz (79 kg)   .Body mass index is 31.86 kg/m. Marland Kitchen GENERAL:alert, in no acute distress and comfortable SKIN: no acute rashes, no significant lesions EYES: conjunctiva are pink and non-injected, sclera anicteric OROPHARYNX: MMM, no exudates, no oropharyngeal erythema or ulceration NECK: supple, no JVD LYMPH:  no palpable lymphadenopathy in the cervical, axillary or inguinal regions LUNGS: clear to auscultation b/l with normal respiratory effort HEART: regular rate & rhythm ABDOMEN:  normoactive bowel sounds , non tender, not distended. Extremity: no pedal edema PSYCH: alert & oriented x 3 with fluent speech NEURO: no focal motor/sensory deficits  LABORATORY DATA:  I have reviewed the data as listed  . CBC Latest Ref Rng & Units 02/07/2021 08/22/2020 07/30/2020  WBC 4.0 - 10.5 K/uL 6.4 6.1 6.8  Hemoglobin 12.0 - 15.0 g/dL 11.8(L)  11.0(L) 10.6(L)  Hematocrit 36.0 - 46.0 % 36.7 34.7(L) 33.6(L)  Platelets 150 - 400 K/uL 454(H) 486(H) 513(H)    . CMP Latest Ref Rng & Units 02/07/2021 07/30/2020 03/28/2020  Glucose 70 - 99 mg/dL 124(H) 95 91  BUN 6 - 20 mg/dL '11 12 13  '$ Creatinine 0.44 - 1.00 mg/dL 1.04(H) 1.01(H) 1.06(H)  Sodium 135 - 145 mmol/L 140 139 136  Potassium 3.5 - 5.1 mmol/L 3.5 4.4 4.2  Chloride 98 - 111 mmol/L 110 107(H) 105  CO2 22 - 32 mmol/L 18(L) 18(L) 18(L)  Calcium 8.9 - 10.3 mg/dL 9.1 9.5 9.8  Total Protein 6.5 - 8.1 g/dL 7.4 7.4 -  Total Bilirubin 0.3 - 1.2 mg/dL 0.3 0.4 -  Alkaline Phos 38 - 126 U/L 49 66 -  AST 15 - 41 U/L 16 22 -  ALT 0 - 44 U/L 9 19 -   Lab Results  Component Value Date   IRON 55 02/07/2021   TIBC 591 (H) 02/07/2021   IRONPCTSAT 9 (L) 02/07/2021   (Iron and TIBC)  Lab Results  Component Value Date   FERRITIN 6 (L) 02/07/2021    09/29/2018 Lymph node biopsy    04/12/2020 Surgical Pathology Lymph Node Biopsy  A. LYMPH NODE, RIGHT INGUINAL, EXCISION:  -Florid lymphoid hyperplasia  -See comment  COMMENT:  The sections show preservation of the lymph nodal architecture.  The  lymphoid tissue displays a large number of variably sized lymphoid  follicles with reactive appearing germinal centers.  Scattered lymphoid  nodules with features of progressive transformation of germinal centers  are seen.  The interfollicular zones are variably expanded by small  lymphocytes, and variable numbers of large centroblastic lymphoid appearing cells in addition to eosinophils in some areas.  No granulomata or metastatic malignancy is identified.  To further evaluate this process, flow cytometric analysis was performed [facility] [id number]) and  failed to show any monoclonal B-cell population or abnormal T-cell phenotype.  In addition, a battery of immunohistochemical stains were  performed including BCL-2, BCL6, CD10, CD20, CD21, CD23, PAX 5, cyclin  D1, CD3, CD5, CD30, CD15, EMA in addition to  EBV in situ hybridization  with appropriate controls.  There is a mixture of T and B cells in their  respective compartments.  B-cell markers highlight the numerous lymphoid  follicles throughout which also stain positively for CD10, and BCL6 and  negatively for BCL-2.  CD21 and CD23 highlight the abundant follicular  dendritic networks within the lymphoid follicles.  The lymphoid  follicles are surrounded by abundant T cells as seen with CD3 and CD5.  No significant cyclin D1 or EBV positivity identified.  CD30 highlights  numerous large lymphoid cells within the interfollicular zones and  germinal centers but with no significant CD15 or EMA positivity  identified.  The overall features are not considered specific but  consistent with reactive lymphoid hyperplasia.  There is no definite or diagnostic evidence of a lymphoproliferative process.  Clinical correlation is recommended.   RADIOGRAPHIC STUDIES: I have personally reviewed the radiological images as listed and agreed with the findings in the report. MM 3D SCREEN BREAST BILATERAL  Result Date: 02/12/2021 CLINICAL DATA:  Screening. EXAM: DIGITAL SCREENING BILATERAL MAMMOGRAM WITH TOMOSYNTHESIS AND CAD TECHNIQUE: Bilateral screening digital craniocaudal and mediolateral oblique mammograms were obtained. Bilateral screening digital breast tomosynthesis was performed. The images were evaluated with computer-aided detection. COMPARISON:  Previous exam(s). ACR Breast Density Category b: There are scattered areas of fibroglandular density. FINDINGS: There are no findings suspicious for malignancy. IMPRESSION: No mammographic evidence of malignancy. A result letter of this screening mammogram will be mailed directly to the patient. RECOMMENDATION: Screening mammogram in one year. (Code:SM-B-01Y) BI-RADS CATEGORY  1: Negative. Electronically Signed   By: Lillia Mountain [name]   On: 02/12/2021 16:06    ASSESSMENT & PLAN:   40 yo with   1) Rt inguiinal and iliac  lymphadenopathy -- reactive lymphoid hyperplasia 2) Patient reported possible Hemochromatosis ?carrier state 3) Iron deficiency anemia likely from hemorrhoidal bleeding 4) Thrombocytosis - likely reactive from iron deficiency. PLAN: -Discussed patient's labwork today, 02/07/2021 Iron Sat and Ferritin very low. Mild anemia but Hgb not very low.  -Advised pt that there is nothing shown on biopsy of lymph nodes that is actionable for [place] at this time. The results showed reactive and not cancerous results.  -Advised pt to address GI bleeding with Dr. Dema Severin for potential surgery to control bleeding. Control hemorrhoids and endometrial bleeding. -Advised pt her blood counts are good to get the ablation.  -Will offer patient IV Iron -continue Iron polysaccharide '150mg'$  po BID  FOLLOW UP: Return to clinic with Dr. Irene Limbo with labs and 6 months  All of the patients questions were answered with apparent satisfaction. The patient knows to call the clinic with any problems, questions or concerns.  . The total time spent in the appointment was 20 minutes and more than 50% was on counseling and direct patient cares.    Sullivan Lone MD [place] AAHIVMS [facility] Hematology/Oncology Physician [facility]  (Office):       [phone] (Work cell):  [phone] (Fax):           [phone]

## 2021-02-19 ENCOUNTER — Telehealth: Payer: Self-pay

## 2021-02-19 NOTE — Telephone Encounter (Signed)
Contacted pt per Dr Irene Limbo to:  let patient know she is still very iron deficient. She has option of getting IV iron replacement. If she is agreeable would setup for 3 IV Venofer infusions. Alternatively she should continue iron polysaccharide 150mg  po BID   Pt acknowledged and agreed to IV Iron. Message sent to scheduling to get pt set up with Iron infusions.

## 2021-02-21 ENCOUNTER — Other Ambulatory Visit: Payer: Self-pay | Admitting: Hematology

## 2021-02-22 ENCOUNTER — Telehealth: Payer: Self-pay | Admitting: Hematology

## 2021-02-22 NOTE — Telephone Encounter (Signed)
Scheduled per sch msg. Called and left msg  

## 2021-03-04 ENCOUNTER — Inpatient Hospital Stay: Payer: Medicaid Other | Attending: Hematology

## 2021-03-04 ENCOUNTER — Other Ambulatory Visit: Payer: Self-pay

## 2021-03-04 VITALS — BP 137/94 | HR 83 | Temp 99.1°F | Resp 75

## 2021-03-04 DIAGNOSIS — D500 Iron deficiency anemia secondary to blood loss (chronic): Secondary | ICD-10-CM

## 2021-03-04 DIAGNOSIS — D509 Iron deficiency anemia, unspecified: Secondary | ICD-10-CM | POA: Diagnosis not present

## 2021-03-04 MED ORDER — SODIUM CHLORIDE 0.9 % IV SOLN
300.0000 mg | Freq: Once | INTRAVENOUS | Status: AC
  Administered 2021-03-04: 300 mg via INTRAVENOUS
  Filled 2021-03-04: qty 300

## 2021-03-04 MED ORDER — SODIUM CHLORIDE 0.9 % IV SOLN: Freq: Once | INTRAVENOUS | Status: AC

## 2021-03-04 MED ORDER — LORATADINE 10 MG PO TABS
10.0000 mg | ORAL_TABLET | Freq: Once | ORAL | Status: AC
  Administered 2021-03-04: 10 mg via ORAL
  Filled 2021-03-04: qty 1

## 2021-03-04 MED ORDER — ACETAMINOPHEN 325 MG PO TABS
650.0000 mg | ORAL_TABLET | Freq: Once | ORAL | Status: AC
Start: 2021-03-04 — End: 2021-03-04
  Administered 2021-03-04: 650 mg via ORAL
  Filled 2021-03-04: qty 2

## 2021-03-04 NOTE — Patient Instructions (Signed)

## 2021-03-11 ENCOUNTER — Inpatient Hospital Stay: Payer: Medicaid Other

## 2021-03-11 ENCOUNTER — Other Ambulatory Visit: Payer: Self-pay

## 2021-03-11 VITALS — BP 132/92 | HR 79 | Temp 98.4°F | Resp 18

## 2021-03-11 DIAGNOSIS — D500 Iron deficiency anemia secondary to blood loss (chronic): Secondary | ICD-10-CM

## 2021-03-11 DIAGNOSIS — D509 Iron deficiency anemia, unspecified: Secondary | ICD-10-CM | POA: Diagnosis not present

## 2021-03-11 MED ORDER — SODIUM CHLORIDE 0.9 % IV SOLN
300.0000 mg | Freq: Once | INTRAVENOUS | Status: AC
  Administered 2021-03-11: 300 mg via INTRAVENOUS
  Filled 2021-03-11: qty 300

## 2021-03-11 MED ORDER — SODIUM CHLORIDE 0.9 % IV SOLN: Freq: Once | INTRAVENOUS | Status: AC

## 2021-03-11 MED ORDER — ONDANSETRON HCL 4 MG/2ML IJ SOLN
4.0000 mg | Freq: Once | INTRAMUSCULAR | Status: AC
  Administered 2021-03-11: 4 mg via INTRAVENOUS
  Filled 2021-03-11: qty 2

## 2021-03-11 MED ORDER — ACETAMINOPHEN 325 MG PO TABS
650.0000 mg | ORAL_TABLET | Freq: Once | ORAL | Status: AC
  Administered 2021-03-11: 650 mg via ORAL
  Filled 2021-03-11: qty 2

## 2021-03-11 MED ORDER — LORATADINE 10 MG PO TABS
10.0000 mg | ORAL_TABLET | Freq: Once | ORAL | Status: AC
  Administered 2021-03-11: 10 mg via ORAL
  Filled 2021-03-11: qty 1

## 2021-03-11 NOTE — Progress Notes (Signed)
Pt started to feel nauseated after Venofer infusion. 4mg  Zofran IVP ordered per Dr. Irene Limbo. Pt also given saltines and ginger ale

## 2021-03-11 NOTE — Progress Notes (Signed)
Pt started feeling better after Zofran and feels good to be discharged.

## 2021-03-11 NOTE — Patient Instructions (Signed)

## 2021-03-13 ENCOUNTER — Encounter: Payer: Self-pay | Admitting: Hematology

## 2021-03-18 ENCOUNTER — Ambulatory Visit: Payer: Medicaid Other

## 2021-03-18 MED ORDER — HYDROCORTISONE (PERIANAL) 2.5 % EX CREA: 1.0000 "application " | TOPICAL_CREAM | Freq: Two times a day (BID) | CUTANEOUS | 0 refills | Status: DC

## 2021-03-27 ENCOUNTER — Telehealth: Payer: Self-pay | Admitting: Hematology

## 2021-03-27 NOTE — Telephone Encounter (Signed)
Per sch msg, patient needs to change 10/31 appt. Called and left msg for patient to call back if changes are still needed

## 2021-04-01 ENCOUNTER — Ambulatory Visit: Payer: Medicaid Other

## 2021-04-11 ENCOUNTER — Telehealth: Payer: Self-pay | Admitting: Critical Care Medicine

## 2021-04-11 ENCOUNTER — Encounter: Payer: Self-pay | Admitting: Critical Care Medicine

## 2021-04-11 MED ORDER — FLUTICASONE PROPIONATE 50 MCG/ACT NA SUSP: 2.0000 | Freq: Every day | NASAL | 6 refills | Status: DC

## 2021-04-11 MED ORDER — CEFDINIR 300 MG PO CAPS: 600.0000 mg | ORAL_CAPSULE | Freq: Every day | ORAL | 0 refills | Status: AC

## 2021-04-11 NOTE — Telephone Encounter (Signed)
Called pt and made appointment. 

## 2021-04-11 NOTE — Telephone Encounter (Signed)
Pt called, feels stuffy, ringing and aching.  Notes sinus congestion.  No OTC has helped. Fever 101 last night.  Left ear. Slight sore throat if swallow.  Slight cough.   No hx of wax. Keeps this out.  Prior hx of ear infection.  Plan cefdinir 300 bid x 7days Sudafed Flonase  ROV with me in three weeks  Carly: need appt with me in three weeks

## 2021-04-30 ENCOUNTER — Telehealth: Payer: Medicaid Other

## 2021-05-01 NOTE — Progress Notes (Signed)
Established Patient Office Visit  Subjective:  Patient ID: Leah Baker, female    [name]: [birth year]  Age: 40 y.o. MRN: [id number]  CC:  Chief Complaint  Patient presents with   Ear Fullness    HPI Leah Baker presents for primary care follow-up.  Note patient was given a course of Augmentin for left ear pain by telephone visit a week ago.  Today she states her symptoms are improved.  She still has decreased hearing in the left ear but her ear is popping open when she applies positive pressure.  On arrival blood pressures 130/89.  Patient has had follow-up with hematology and they did not feel her adenopathy is from malignancy.  Patient is on iron infusions for iron deficiency anemia due to menorrhagia and rectal bleeding from internal hemorrhoids.  Patient would like a second opinion referral for her hemorrhoids.  Patient has no other complaints Past Medical History:  Diagnosis Date   Abnormal uterine bleeding (AUB)    Anemia    Phreesia 06/08/2020   Anxiety    Blood in stool last 2 years   Depression    Depression    Phreesia 06/08/2020   Hemorrhoids    History of blood transfusion 05/2019 2 units   iron transfusion given also   History of lower GI bleeding dec 2021 active problem   s/p  colonoscopy 09-01-2018,  hemorrhoids   IDA (iron deficiency anemia)    Inguinal lymphadenopathy    CT 09-14-2018  right side, followed by pcp   PONV (postoperative nausea and vomiting)    only happened once   Wears glasses    Wears glasses     Past Surgical History:  Procedure Laterality Date   BIOPSY  09/01/2018   Procedure: BIOPSY;  Surgeon: Otis Brace, MD;  Location: [place];  Service: Gastroenterology;;   CESAREAN SECTION  01/15/2002   '@[facility]'    CESAREAN SECTION  03/17/2011   Procedure: CESAREAN SECTION;  Surgeon: Osborne Oman, MD;  Location: [facility];  Service: Gynecology;  Laterality: N/A;   COLONOSCOPY WITH PROPOFOL N/A 09/01/2018   Procedure: COLONOSCOPY WITH  PROPOFOL;  Surgeon: Otis Brace, MD;  Location: [facility];  Service: Gastroenterology;  Laterality: N/A;   DILITATION & CURRETTAGE/HYSTROSCOPY WITH HYDROTHERMAL ABLATION N/A 08/22/2020   Procedure: DILATATION & CURETTAGE/HYSTEROSCOPY CERVICAL REPAIR;  Surgeon: [name], MD;  Location: [facility];  Service: Gynecology;  Laterality: N/A;   HYSTEROSCOPY WITH D & C  06-21-2009  dr constant '@[facility]'    w/  REMOVAL IUD    INGUINAL LYMPH NODE BIOPSY Right 04/12/2020   Procedure: RIGHT INGUINAL LYMPH NODE EXCISIONAL BIOPSY;  Surgeon: Stark Klein, MD;  Location: [facility];  Service: General;  Laterality: Right;   IUD REMOVAL  06/21/2009   KNEE ARTHROSCOPY WITH ANTERIOR CRUCIATE LIGAMENT (ACL) REPAIR Left 09-08-2013   dr [name]  '@[facility]'    w/  meniscal repair   MULTIPLE TOOTH EXTRACTIONS  2012   RECTAL EXAM UNDER ANESTHESIA N/A 11/18/2018   Procedure: ANORECTAL EXAM UNDER ANESTHESIA;  Surgeon: Ileana Roup, MD;  Location: [facility];  Service: General;  Laterality: N/A;   TUBAL LIGATION Bilateral 2012    Family History  Problem Relation Age of Onset   Cancer Mother    Kidney failure Mother    Healthy Father     Social History   Socioeconomic History   Marital status: Significant Other    Spouse name: Not on file   Number of  children: Not on file   Years of education: Not on file   Highest education level: Not on file  Occupational History   Not on file  Tobacco Use   Smoking status: Former    Years: 5.00    Types: Cigarettes    Quit date: 08/22/2008    Years since quitting: 12.7   Smokeless tobacco: Never   Tobacco comments:    social  Vaping Use   Vaping Use: Never used  Substance and Sexual Activity   Alcohol use: Not Currently   Drug use: Not Currently    Comment: per pt younger in college marijuana, none since   Sexual activity: Yes    Birth control/protection: Surgical  Other Topics Concern   Not on file  Social History Narrative    Not on file   Social Determinants of Health   Financial Resource Strain: Not on file  Food Insecurity: Not on file  Transportation Needs: Not on file  Physical Activity: Not on file  Stress: Not on file  Social Connections: Not on file  Intimate Partner Violence: Not on file    Outpatient Medications Prior to Visit  Medication Sig Dispense Refill   acetaminophen (TYLENOL) 500 MG tablet Take 1 tablet (500 mg total) by mouth every 6 (six) hours as needed. 30 tablet 0   Ferrous Sulfate (IRON) 325 (65 Fe) MG TABS Take by mouth in the morning and at bedtime.     fluticasone (FLONASE) 50 MCG/ACT nasal spray Place 2 sprays into both nostrils daily. 16 g 6   Menthol-Camphor (TIGER BALM ARTHRITIS RUB EX) Apply 1 application topically daily as needed (knee pain).     hydrocortisone (ANUSOL-HC) 2.5 % rectal cream Place 1 application rectally 2 (two) times daily. 30 g 0   norethindrone (AYGESTIN) 5 MG tablet Take 3 tablets (15 mg total) by mouth daily. 90 tablet 3   sertraline (ZOLOFT) 100 MG tablet Take 1 tablet (100 mg total) by mouth at bedtime. 60 tablet 3   Facility-Administered Medications Prior to Visit  Medication Dose Route Frequency Provider Last Rate Last Admin   bupivacaine liposome (EXPAREL) 1.3 % injection 266 mg  20 mL Infiltration Once Stark Klein, MD        Allergies  Allergen Reactions   Latex Itching and Rash    ROS Review of Systems  Constitutional:  Negative for chills, diaphoresis and fever.  HENT:  Positive for congestion and hearing loss. Negative for nosebleeds, sore throat and tinnitus.   Eyes:  Negative for photophobia and redness.  Respiratory:  Negative for cough, shortness of breath, wheezing and stridor.   Cardiovascular:  Negative for chest pain, palpitations and leg swelling.  Gastrointestinal:  Positive for blood in stool. Negative for abdominal pain, constipation, diarrhea, nausea and vomiting.  Endocrine: Negative for polydipsia.  Genitourinary:   Negative for dysuria, flank pain, frequency, hematuria and urgency.  Musculoskeletal:  Negative for back pain, myalgias and neck pain.  Skin:  Negative for rash.  Allergic/Immunologic: Negative for environmental allergies.  Neurological:  Negative for dizziness, tremors, seizures, weakness and headaches.  Hematological:  Does not bruise/bleed easily.  Psychiatric/Behavioral:  Negative for suicidal ideas. The patient is not nervous/anxious.      Objective:    Physical Exam Vitals reviewed.  Constitutional:      Appearance: Normal appearance. She is well-developed. She is not diaphoretic.  HENT:     Head: Normocephalic and atraumatic.     Right Ear: Tympanic membrane, ear canal and external  ear normal. There is no impacted cerumen.     Left Ear: Tympanic membrane, ear canal and external ear normal. There is no impacted cerumen.     Nose: No nasal deformity, septal deviation, mucosal edema or rhinorrhea.     Right Sinus: No maxillary sinus tenderness or frontal sinus tenderness.     Left Sinus: No maxillary sinus tenderness or frontal sinus tenderness.     Mouth/Throat:     Pharynx: No oropharyngeal exudate.  Eyes:     General: No scleral icterus.    Conjunctiva/sclera: Conjunctivae normal.     Pupils: Pupils are equal, round, and reactive to light.  Neck:     Thyroid: No thyromegaly.     Vascular: No carotid bruit or JVD.     Trachea: Trachea normal. No tracheal tenderness or tracheal deviation.  Cardiovascular:     Rate and Rhythm: Normal rate and regular rhythm.     Chest Wall: PMI is not displaced.     Pulses: Normal pulses. No decreased pulses.     Heart sounds: Normal heart sounds, S1 normal and S2 normal. Heart sounds not distant. No murmur heard. No systolic murmur is present.  No diastolic murmur is present.    No friction rub. No gallop. No S3 or S4 sounds.  Pulmonary:     Effort: No tachypnea, accessory muscle usage or respiratory distress.     Breath sounds: No  stridor. No decreased breath sounds, wheezing, rhonchi or rales.  Chest:     Chest wall: No tenderness.  Abdominal:     General: Bowel sounds are normal. There is no distension.     Palpations: Abdomen is soft. Abdomen is not rigid.     Tenderness: There is no abdominal tenderness. There is no guarding or rebound.  Musculoskeletal:        General: Normal range of motion.     Cervical back: Normal range of motion and neck supple. No edema, erythema or rigidity. No muscular tenderness. Normal range of motion.  Lymphadenopathy:     Head:     Right side of head: No submental or submandibular adenopathy.     Left side of head: No submental or submandibular adenopathy.     Cervical: No cervical adenopathy.  Skin:    General: Skin is warm and dry.     Coloration: Skin is not pale.     Findings: No rash.     Nails: There is no clubbing.  Neurological:     Mental Status: She is alert and oriented to person, place, and time.     Sensory: No sensory deficit.  Psychiatric:        Speech: Speech normal.        Behavior: Behavior normal.    BP 130/89   Pulse 99   Resp 16   Wt 169 lb 12.8 oz (77 kg)   SpO2 96%   BMI 31.06 kg/m  Wt Readings from Last 3 Encounters:  05/02/21 169 lb 12.8 oz (77 kg)  02/07/21 174 lb 3.2 oz (79 kg)  09/13/20 173 lb (78.5 kg)     There are no preventive care reminders to display for this patient.   There are no preventive care reminders to display for this patient.  Lab Results  Component Value Date   TSH 1.070 03/28/2020   Lab Results  Component Value Date   WBC 6.4 02/07/2021   HGB 11.8 (L) 02/07/2021   HCT 36.7 02/07/2021   MCV 81.4 02/07/2021  PLT 454 (H) 02/07/2021   Lab Results  Component Value Date   NA 140 02/07/2021   K 3.5 02/07/2021   CO2 18 (L) 02/07/2021   GLUCOSE 124 (H) 02/07/2021   BUN 11 02/07/2021   CREATININE 1.04 (H) 02/07/2021   BILITOT 0.3 02/07/2021   ALKPHOS 49 02/07/2021   AST 16 02/07/2021   ALT 9 02/07/2021    PROT 7.4 02/07/2021   ALBUMIN 4.0 02/07/2021   CALCIUM 9.1 02/07/2021   ANIONGAP 12 02/07/2021   EGFR 72 07/30/2020   No results found for: CHOL No results found for: HDL No results found for: LDLCALC No results found for: TRIG No results found for: CHOLHDL No results found for: HGBA1C    Assessment & Plan:   Problem List Items Addressed This Visit       Cardiovascular and Mediastinum   Hemorrhoid    Plan referral for second opinion of internal hemorrhoids to [facility] surgery      Relevant Orders   Ambulatory referral to General Surgery     Digestive   Rectal bleeding - Primary   Relevant Orders   Ambulatory referral to General Surgery     Other   Depression   Relevant Medications   sertraline (ZOLOFT) 100 MG tablet   Iron deficiency anemia    Continue iron infusions per hematology      Menorrhagia    Improved with hormonal therapy refills given      Relevant Medications   norethindrone (AYGESTIN) 5 MG tablet   Lymphoid hyperplasia    No malignancy observe for now      Carrier of hemochromatosis HFE gene mutation    No active hemochromatosis she is a carrier only      Other Visit Diagnoses     Anxiety as acute reaction to exceptional stress       Relevant Medications   sertraline (ZOLOFT) 100 MG tablet       Meds ordered this encounter  Medications   sertraline (ZOLOFT) 100 MG tablet    Sig: Take 1 tablet (100 mg total) by mouth at bedtime.    Dispense:  60 tablet    Refill:  3    Dose increase   norethindrone (AYGESTIN) 5 MG tablet    Sig: Take 3 tablets (15 mg total) by mouth daily.    Dispense:  90 tablet    Refill:  3   hydrocortisone (ANUSOL-HC) 2.5 % rectal cream    Sig: Place 1 application rectally 2 (two) times daily.    Dispense:  30 g    Refill:  0    Follow-up: Return in about 4 months (around 08/31/2021).    Asencion Noble, MD

## 2021-05-02 ENCOUNTER — Other Ambulatory Visit: Payer: Self-pay

## 2021-05-02 ENCOUNTER — Ambulatory Visit: Payer: Medicaid Other | Attending: Critical Care Medicine | Admitting: Critical Care Medicine

## 2021-05-02 ENCOUNTER — Encounter: Payer: Self-pay | Admitting: Critical Care Medicine

## 2021-05-02 VITALS — BP 130/89 | HR 99 | Resp 16 | Wt 169.8 lb

## 2021-05-02 DIAGNOSIS — R599 Enlarged lymph nodes, unspecified: Secondary | ICD-10-CM

## 2021-05-02 DIAGNOSIS — F411 Generalized anxiety disorder: Secondary | ICD-10-CM | POA: Diagnosis not present

## 2021-05-02 DIAGNOSIS — D500 Iron deficiency anemia secondary to blood loss (chronic): Secondary | ICD-10-CM | POA: Diagnosis not present

## 2021-05-02 DIAGNOSIS — F430 Acute stress reaction: Secondary | ICD-10-CM

## 2021-05-02 DIAGNOSIS — Z148 Genetic carrier of other disease: Secondary | ICD-10-CM | POA: Diagnosis not present

## 2021-05-02 DIAGNOSIS — Z23 Encounter for immunization: Secondary | ICD-10-CM

## 2021-05-02 DIAGNOSIS — F329 Major depressive disorder, single episode, unspecified: Secondary | ICD-10-CM | POA: Diagnosis not present

## 2021-05-02 DIAGNOSIS — K625 Hemorrhage of anus and rectum: Secondary | ICD-10-CM

## 2021-05-02 DIAGNOSIS — K649 Unspecified hemorrhoids: Secondary | ICD-10-CM

## 2021-05-02 DIAGNOSIS — N921 Excessive and frequent menstruation with irregular cycle: Secondary | ICD-10-CM

## 2021-05-02 MED ORDER — NORETHINDRONE ACETATE 5 MG PO TABS: 15.0000 mg | ORAL_TABLET | Freq: Every day | ORAL | 3 refills | Status: DC

## 2021-05-02 MED ORDER — SERTRALINE HCL 100 MG PO TABS: 100.0000 mg | ORAL_TABLET | Freq: Every day | ORAL | 3 refills | Status: DC

## 2021-05-02 MED ORDER — HYDROCORTISONE (PERIANAL) 2.5 % EX CREA: 1.0000 "application " | TOPICAL_CREAM | Freq: Two times a day (BID) | CUTANEOUS | 0 refills | Status: DC

## 2021-05-02 NOTE — Assessment & Plan Note (Signed)
Plan referral for second opinion of internal hemorrhoids to [facility] surgery

## 2021-05-02 NOTE — Assessment & Plan Note (Signed)
No malignancy observe for now

## 2021-05-02 NOTE — Assessment & Plan Note (Signed)
Continue iron infusions per hematology

## 2021-05-02 NOTE — Patient Instructions (Addendum)
Tetanus vaccine given  Referral for second opinion to [place] surgery on your hemorrhoids made  No change in medications  Refills on all medications sent to your [facility] pharmacy  Return Dr [name] 4 months

## 2021-05-02 NOTE — Assessment & Plan Note (Signed)
Improved with hormonal therapy refills given

## 2021-05-02 NOTE — Assessment & Plan Note (Signed)
No active hemochromatosis she is a carrier only

## 2021-07-05 ENCOUNTER — Encounter: Payer: Self-pay | Admitting: Critical Care Medicine

## 2021-07-08 ENCOUNTER — Other Ambulatory Visit: Payer: Self-pay | Admitting: Critical Care Medicine

## 2021-07-08 DIAGNOSIS — S025XXB Fracture of tooth (traumatic), initial encounter for open fracture: Secondary | ICD-10-CM

## 2021-07-08 NOTE — Progress Notes (Signed)
Medicaid dental referral

## 2021-07-15 NOTE — Telephone Encounter (Signed)
Please book this pt an appt with cari mayers at [facility] today for her acute sore throat issue

## 2021-07-24 ENCOUNTER — Other Ambulatory Visit: Payer: Self-pay

## 2021-07-24 NOTE — Patient Instructions (Signed)
Visit Information  Leah Baker was given information about Medicaid Managed Care team care coordination services as a part of their [place] Medicaid benefit. Leah Baker verbally consented to engagement with the [facility] Managed Care team.   If you are experiencing a medical emergency, please call 911 or report to your local emergency department or urgent care.   If you have a non-emergency medical problem during routine business hours, please contact your provider's office and ask to speak with a nurse.   For questions related to your [facility] please call: [phone] or visit the homepage here: [email or web address]  If you would like to schedule transportation through your [facility], please call the following number at least 2 days in advance of your appointment: [phone].  Rides for urgent appointments can also be made after hours by calling Member Services.  Call the [place] at [phone], at any time, 24 hours a day, 7 days a week. If you are in danger or need immediate medical attention call 911.  If you would like help to quit smoking, call 1-800-QUIT-NOW 410-517-3191) OR Espaol: 1-855-Djelo-Ya (4-132-440-1027) o para ms informacin haga clic aqu or Text READY to 200-400 to register via text  Ms. Arterburn - following are the goals we discussed in your visit today:   Goals Addressed   None     Please see education materials related to today's visit provided by MyChart link.  Patient verbalizes understanding of instructions and care plan provided today and agrees to view in [place]. Active MyChart status confirmed with patient.    The Managed Medicaid care management team will reach out to the patient again over the next 30 days.   Salvatore Marvel RN, BSN Community Care Coordinator [place]  Network Mobile: [phone]   Following is a copy of your plan of care:  Care Plan : [place] of Care  Updates made by Inge Rise, RN since 07/24/2021 12:00 AM     Problem: Chronic Disease Management and Care Coordination Needs for Iron Deficiency Anemia, Internal hemorrhoid, Anxiety & Depression   Priority: High     Long-Range Goal: Development of Plan of Care for Chronic Disease Management and Care Coordination Needs (Iron Deficiency Anemia & Bleeding due to internal hemorroid; Anxiety & Depression)   Start Date: 07/24/2021  Expected End Date: 11/21/2021  Priority: High  Note:   Current Barriers:  Knowledge Deficits related to plan of care for management of Anxiety with Excessive Worry, Social Anxiety, and Depression: anxiety loss of energy/fatigue disturbed sleep decreased appetite and Iron Deficiency Anemia & Bleeding due to internal hemorrhoid  Care Coordination needs related to Lacks knowledge of community resource: Dental & Vision resources  Chronic Disease Management support and education needs related to Anxiety with Excessive Worry, Social Anxiety,, Depression: anxiety loss of energy/fatigue disturbed sleep decreased appetite, and Iron Deficiency Anemia  and Bleeding due to internal hemorroid  RNCM Clinical Goal(s):  RNCM will help patient verbalize understanding of plan for management of Anxiety, Depression, and Iron Deficiency Anemia & bleeding due to internal hemorrhoid as evidenced by providing education regarding these chronic diseases. verbalize basic understanding of Anxiety, Depression, and Iron Deficiency Anemia & bleeding due to internal hemorrhoid disease process and self health management plan as evidenced by improved management of these chronic diseases take all medications exactly as prescribed and will call provider for medication related questions as evidenced by  medication compliance    attend all scheduled medical appointments:  08/08/2021 Dr. Irene Limbo (hematologist)  as evidenced by attending all scheduled appointments        demonstrate ongoing adherence to prescribed treatment plan for Anxiety, Depression, and Iron Deficiency Anemia & bleeding due to internal hemorrhoid as evidenced by patient being compliant with treatment plan. continue to work with Consulting civil engineer and/or Social Worker to address care management and care coordination needs related to Anxiety, Depression, and Iron Deficiency Anemia & Bleeding due to internal hemorrhoid  as evidenced by adherence to CM Team Scheduled appointments     work with Education officer, museum to address Lacks knowledge of community resource: Dental and Vision resources related to the management of Anxiety, Depression, and Iron Deficiency Anemia & bleeding due to internal hemorrhoid as evidenced by review of EMR and patient or Education officer, museum report     through collaboration with Consulting civil engineer, provider, and care team.   Interventions: Inter-disciplinary care team collaboration (see longitudinal plan of care) Evaluation of current treatment plan related to  self management and patient's adherence to plan as established by provider   Anemia/Bleeding:  (Status: New goal.) Long Term Goal  Assessment of understanding of anemia/bleeding disorder diagnosis Basic overview and discussion of anemia/bleeding disorder or acute disease state Review of most recent labs:  Lab Results  Component Value Date   WBC 6.4 02/07/2021   HGB 11.8 (L) 02/07/2021   HCT 36.7 02/07/2021   MCV 81.4 02/07/2021   PLT 454 (H) 02/07/2021   Medications reviewed - Counseled on avoidance of NSAIDs due to increased bleeding risk; - Counseled on importance of regular laboratory monitoring as directed by provider; - recommended promotion of rest and energy-conserving measures to manage fatigue, such as balancing activity with periods of rest - Screening for signs and symptoms of depression related to chronic disease state;   - Assessed social determinant of health barriers;    Anxiety & Depression  (Status:  New goal.)  Long Term Goal Evaluation of current treatment plan related to Anxiety and Depression, Lacks knowledge of community resource: Dental & Vision resources,  self-management and patient's adherence to plan as established by provider. Discussed plans with patient for ongoing care management follow up and provided patient with direct contact information for care management team Evaluation of current treatment plan related to Anxiety & Depression and patient's adherence to plan as established by provider Advised patient to discuss the possibly of use of anti-anxiety medication at next medical office visit Reviewed medications with patient and discussed use of antidepressant Reviewed scheduled/upcoming provider appointments including Social Work referral for [place] resources Screening for signs and symptoms of depression related to chronic disease state  Assessed social determinant of health barriers   Patient Goals/Self-Care Activities: Take medications as prescribed   Attend all scheduled provider appointments Call pharmacy for medication refills 3-7 days in advance of running out of medications Call provider office for new concerns or questions  Work with the social worker to address care coordination needs and will continue to work with the clinical team to address health care and disease management related needs

## 2021-07-24 NOTE — Patient Outreach (Signed)
Medicaid Managed Care   Nurse Care Manager Note  07/24/2021 Name:  Leah Baker MRN:  [id number] [name]:  1980/07/02  Leah Baker is an 41 y.o. year old female who is a primary patient of Elsie Stain, MD.  The [facility] Managed Care Coordination team was consulted for assistance with:    Anxiety Depression Iron Deficiency Anemia & Bleeding due to internal hemorrhoid  [name] was given information about Medicaid Managed Care Coordination team services today. Leah Baker Patient agreed to services and verbal consent obtained.  Engaged with patient by telephone for initial visit in response to provider referral for case management and/or care coordination services.   Assessments/Interventions:  Review of past medical history, allergies, medications, health status, including review of consultants reports, laboratory and other test data, was performed as part of comprehensive evaluation and provision of chronic care management services.  SDOH (Social Determinants of Health) assessments and interventions performed: SDOH Interventions    Flowsheet Row Most Recent Value  SDOH Interventions   Food Insecurity Interventions Intervention Not Indicated  Financial Strain Interventions Intervention Not Indicated  Housing Interventions Intervention Not Indicated  Physical Activity Interventions Intervention Not Indicated  Stress Interventions Intervention Not Indicated  Social Connections Interventions Intervention Not Indicated  Transportation Interventions Intervention Not Indicated  Depression Interventions/Treatment  Medication       Care Plan  Allergies  Allergen Reactions   Latex Itching and Rash    Medications Reviewed Today     Reviewed by Inge Rise, RN (Case Manager) on 07/24/21 at [place] Status: <None>   Medication Order Taking? Sig Documenting Provider Last Dose Status Informant  acetaminophen (TYLENOL) 500 MG tablet [id number] Yes Take 1  tablet (500 mg total) by mouth every 6 (six) hours as needed. Chase Picket, MD Taking Active Self  bupivacaine liposome (EXPAREL) 1.3 % injection 266 mg [id number]   Stark Klein, MD  Active   Ferrous Sulfate (IRON) 325 (65 Fe) MG TABS [id number] Yes Take by mouth in the morning and at bedtime. [provider] Taking Active Self           Med Note [facility], [facility] A   Wed Jul 24, 2021  9:37 AM) Taking 3 times a day  fluticasone (FLONASE) 50 MCG/ACT nasal spray [id number] Yes Place 2 sprays into both nostrils daily. Elsie Stain, MD Taking Active            Med Note [facility], [facility] A   Wed Jul 24, 2021  9:37 AM) PRN  hydrocortisone (ANUSOL-HC) 2.5 % rectal cream [id number] Yes Place 1 application rectally 2 (two) times daily. Elsie Stain, MD Taking Active            Med Note [facility], [facility] A   Wed Jul 24, 2021  9:37 AM) PRN  Menthol-Camphor Johny Sax BALM ARTHRITIS RUB [place]) [id number] Yes Apply 1 application topically daily as needed (knee pain). [provider] Taking Active Self  norethindrone (AYGESTIN) 5 MG tablet [id number] Yes Take 3 tablets (15 mg total) by mouth daily. Elsie Stain, MD Taking Active   sertraline (ZOLOFT) 100 MG tablet [id number] Yes Take 1 tablet (100 mg total) by mouth at bedtime. Elsie Stain, MD Taking Active             Patient Active Problem List   Diagnosis Date Noted   Carrier of hemochromatosis HFE gene mutation 08/07/2020   Lymphoid hyperplasia 07/30/2020   Anxiety 06/22/2020   Menorrhagia 11/27/2018  Rectal bleeding 11/26/2018   Liver lesion 09/09/2018   Hemorrhoid 11/29/2014   Iron deficiency anemia 05/04/2014   Mood disorder ([place]) 02/20/2014   Tears of meniscus and ACL of left knee 09/08/2013   Depression 05/14/2011   H/O: cesarean section 08/28/2010    Conditions to be addressed/monitored per PCP order:  Anxiety, Depression, and Iron Deficiency Anemia & Bleeding due to internal hemorrhoid.  Care  Plan : RN Care Manager Plan of Care  Updates made by Inge Rise, RN since 07/24/2021 12:00 AM     Problem: Chronic Disease Management and Care Coordination Needs for Iron Deficiency Anemia, Internal hemorrhoid, Anxiety & Depression   Priority: High     Long-Range Goal: Development of Plan of Care for Chronic Disease Management and Care Coordination Needs (Iron Deficiency Anemia & Bleeding due to internal hemorroid; Anxiety & Depression)   Start Date: 07/24/2021  Expected End Date: 11/21/2021  Priority: High  Note:   Current Barriers:  Knowledge Deficits related to plan of care for management of Anxiety with Excessive Worry, Social Anxiety, and Depression: anxiety loss of energy/fatigue disturbed sleep decreased appetite and Iron Deficiency Anemia & Bleeding due to internal hemorrhoid  Care Coordination needs related to Lacks knowledge of community resource: Dental & Vision resources  Chronic Disease Management support and education needs related to Anxiety with Excessive Worry, Social Anxiety,, Depression: anxiety loss of energy/fatigue disturbed sleep decreased appetite, and Iron Deficiency Anemia  and Bleeding due to internal hemorroid  RNCM Clinical Goal(s):  RNCM will help patient verbalize understanding of plan for management of Anxiety, Depression, and Iron Deficiency Anemia & bleeding due to internal hemorrhoid as evidenced by providing education regarding these chronic diseases. verbalize basic understanding of Anxiety, Depression, and Iron Deficiency Anemia & bleeding due to internal hemorrhoid disease process and self health management plan as evidenced by improved management of these chronic diseases take all medications exactly as prescribed and will call provider for medication related questions as evidenced by medication compliance    attend all scheduled medical appointments: 08/08/2021 Dr. Irene Limbo (hematologist)  as evidenced by attending all scheduled appointments         demonstrate ongoing adherence to prescribed treatment plan for Anxiety, Depression, and Iron Deficiency Anemia & bleeding due to internal hemorrhoid as evidenced by patient being compliant with treatment plan. continue to work with Consulting civil engineer and/or Social Worker to address care management and care coordination needs related to Anxiety, Depression, and Iron Deficiency Anemia & Bleeding due to internal hemorrhoid  as evidenced by adherence to CM Team Scheduled appointments     work with Education officer, museum to address [employer] knowledge of community resource: Dental and Vision resources related to the management of Anxiety, Depression, and Iron Deficiency Anemia & bleeding due to internal hemorrhoid as evidenced by review of EMR and patient or Education officer, museum report     through collaboration with Consulting civil engineer, provider, and care team.   Interventions: Inter-disciplinary care team collaboration (see longitudinal plan of care) Evaluation of current treatment plan related to  self management and patient's adherence to plan as established by provider   Anemia/Bleeding:  (Status: New goal.) Long Term Goal  Assessment of understanding of anemia/bleeding disorder diagnosis Basic overview and discussion of anemia/bleeding disorder or acute disease state Review of most recent labs:  Lab Results  Component Value Date   WBC 6.4 02/07/2021   HGB 11.8 (L) 02/07/2021   HCT 36.7 02/07/2021   MCV 81.4 02/07/2021   PLT 454 (  H) 02/07/2021   Medications reviewed - Counseled on avoidance of NSAIDs due to increased bleeding risk; - Counseled on importance of regular laboratory monitoring as directed by provider; - recommended promotion of rest and energy-conserving measures to manage fatigue, such as balancing activity with periods of rest - Screening for signs and symptoms of depression related to chronic disease state;  - Assessed social determinant of health barriers;    Anxiety & Depression  (Status:  New  goal.)  Long Term Goal Evaluation of current treatment plan related to Anxiety and Depression, Lacks knowledge of community resource: Dental & Vision resources,  self-management and patient's adherence to plan as established by provider. Discussed plans with patient for ongoing care management follow up and provided patient with direct contact information for care management team Evaluation of current treatment plan related to Anxiety & Depression and patient's adherence to plan as established by provider Advised patient to discuss the possibly of use of anti-anxiety medication at next medical office visit Reviewed medications with patient and discussed use of antidepressant Reviewed scheduled/upcoming provider appointments including Social Work referral for [employer] resources Screening for signs and symptoms of depression related to chronic disease state  Assessed social determinant of health barriers   Patient Goals/Self-Care Activities: Take medications as prescribed   Attend all scheduled provider appointments Call pharmacy for medication refills 3-7 days in advance of running out of medications Call provider office for new concerns or questions  Work with the social worker to address care coordination needs and will continue to work with the clinical team to address health care and disease management related needs       Follow Up:  Patient agrees to Care Plan and Follow-up.  Plan: The Managed Medicaid care management team will reach out to the patient again over the next 30 days.  Date/time of next scheduled RN care management/care coordination outreach:  08/21/2021 at 9:00 AM  [place], [employer] Mobile: [phone]

## 2021-07-29 ENCOUNTER — Other Ambulatory Visit: Payer: Self-pay

## 2021-07-29 NOTE — Patient Outreach (Signed)
Care Coordination  07/29/2021  GLADY OUDERKIRK 1980/06/18 [id number]   Medicaid Managed Care   Unsuccessful Outreach Note  07/29/2021 Name: MEIA EMLEY MRN: [id number] [name]: 04/03/1981  Referred by: Elsie Stain, MD Reason for referral : High Risk Managed Medicaid (MM social work telephone outreach)   An unsuccessful telephone outreach was attempted today. The patient was referred to the case management team for assistance with care management and care coordination.   Follow Up Plan: The care management team will reach out to the patient again over the next 7 days.   Mickel Fuchs, BSW, [place] Managed Medicaid Team  [phone]

## 2021-07-29 NOTE — Patient Instructions (Signed)
Visit Information  Ms. [name]  - as a part of your Medicaid benefit, you are eligible for care management and care coordination services at no cost or copay. I was unable to reach you by phone today but would be happy to help you with your health related needs. Please feel free to call me @ [phone]   A member of the Managed Medicaid care management team will reach out to you again over the next 7 days.   Mickel Fuchs, BSW, [place] Managed Medicaid Team  [phone]

## 2021-08-07 ENCOUNTER — Other Ambulatory Visit: Payer: Self-pay

## 2021-08-07 DIAGNOSIS — L659 Nonscarring hair loss, unspecified: Secondary | ICD-10-CM

## 2021-08-07 NOTE — Patient Instructions (Signed)
Visit Information ? ?Leah Baker was given information about Medicaid Managed Care team care coordination services as a part of their [place] Medicaid benefit. Leah Baker verbally consented to engagement with the [facility] Managed Care team.  ? ?If you are experiencing a medical emergency, please call 911 or report to your local emergency department or urgent care.  ? ?If you have a non-emergency medical problem during routine business hours, please contact your provider's office and ask to speak with a nurse.  ? ?For questions related to your [facility], please call: [phone] or visit the homepage here: [email or web address] ? ?If you would like to schedule transportation through your [facility], please call the following number at least 2 days in advance of your appointment: [phone]. ? Rides for urgent appointments can also be made after hours by calling Member Services. ? ?Call the [place] at [phone], at any time, 24 hours a day, 7 days a week. If you are in danger or need immediate medical attention call 911. ? ?If you would like help to quit smoking, call 1-800-QUIT-NOW 607-566-3960) OR Espa?ol: 1-855-D?jelo-Ya (646) 722-5860) o para m?s informaci?n haga clic aqu? or Text READY to 200-400 to register via text ? ?Leah Baker - following are the goals we discussed in your visit today:  ? Goals Addressed   ?None ?  ? ? ?Social Worker will follow up in 2 days.  ? ?. Mickel Fuchs, BSW,  ?[place]  ?High Risk Managed Medicaid Team  ?[phone]  ? ?Following is a copy of your plan of care:  ?Care Plan : [place] of Care  ?Updates made by Ethelda Chick since 08/07/2021 12:00 AM  ?  ? ?Problem: Chronic Disease Management and Care Coordination Needs for Iron Deficiency Anemia, Internal hemorrhoid,  Anxiety & Depression   ?Priority: High  ?  ? ?Long-Range Goal: Development of Plan of Care for Chronic Disease Management and Care Coordination Needs (Iron Deficiency Anemia & Bleeding due to internal hemorroid; Anxiety & Depression)   ?Start Date: 07/24/2021  ?Expected End Date: 11/21/2021  ?Priority: High  ?Note:   ?Current Barriers:  ?Knowledge Deficits related to plan of care for management of Anxiety with Excessive Worry, ?Social Anxiety, and Depression: anxiety ?loss of energy/fatigue ?disturbed sleep ?decreased appetite and Iron Deficiency Anemia & Bleeding due to internal hemorrhoid  ?Care Coordination needs related to Lacks knowledge of community resource: Dental & Vision resources  ?Chronic Disease Management support and education needs related to Anxiety with Excessive Worry, ?Social Anxiety,, Depression: anxiety ?loss of energy/fatigue ?disturbed sleep ?decreased appetite, and Iron Deficiency Anemia  and Bleeding due to internal hemorroid ? ?RNCM Clinical Goal(s):  ?RNCM will help patient verbalize understanding of plan for management of Anxiety, Depression, and Iron Deficiency Anemia & bleeding due to internal hemorrhoid as evidenced by providing education regarding these chronic diseases. ?verbalize basic understanding of Anxiety, Depression, and Iron Deficiency Anemia & bleeding due to internal hemorrhoid disease process and self health management plan as evidenced by improved management of these chronic diseases ?take all medications exactly as prescribed and will call provider for medication related questions as evidenced by medication compliance    ?attend all scheduled medical appointments: 08/08/2021 Dr. Irene Limbo (hematologist)  as evidenced by attending all scheduled appointments        ?demonstrate ongoing adherence to prescribed treatment plan for Anxiety, Depression, and Iron Deficiency Anemia & bleeding  due to internal hemorrhoid as evidenced by patient being compliant with treatment plan. ?continue  to work with Consulting civil engineer and/or Social Worker to address care management and care coordination needs related to Anxiety, Depression, and Iron Deficiency Anemia & Bleeding due to internal hemorrhoid  as evidenced by adherence to CM Team Scheduled appointments     ?work with Education officer, museum to address L[place] knowledge of community resource: Dental and Vision resources related to the management of Anxiety, Depression, and Iron Deficiency Anemia & bleeding due to internal hemorrhoid as evidenced by review of EMR and patient or Education officer, museum report     through collaboration with Consulting civil engineer, provider, and care team.  ? ?Interventions: ?Inter-disciplinary care team collaboration (see longitudinal plan of care) ?Evaluation of current treatment plan related to  self management and patient's adherence to plan as established by provider ?08/07/21: BSW completed telephone outreach with patient today. Patient states she is in need of dental and vision resources. Patient states she would also like some assistance pretaining to hair loss. She has been on strong hormones. BSW will send patients PCP and OB a message about a referral or assistance with hair loss. BSW will email resources to [email or web address]. ? ? ?Anemia/Bleeding:  (Status: New goal.) Long Term Goal  ?Assessment of understanding of anemia/bleeding disorder diagnosis ?Basic overview and discussion of anemia/bleeding disorder or acute disease state ?Review of most recent labs:  ?Lab Results  ?Component Value Date  ? WBC 6.4 02/07/2021  ? HGB 11.8 (L) 02/07/2021  ? HCT 36.7 02/07/2021  ? MCV 81.4 02/07/2021  ? PLT 454 (H) 02/07/2021  ? ?Medications reviewed ?- Counseled on avoidance of NSAIDs due to increased bleeding risk; ?- Counseled on importance of regular laboratory monitoring as directed by provider; ?- recommended promotion of rest and energy-conserving measures to manage fatigue, such as balancing activity with periods of rest ?- Screening for signs and  symptoms of depression related to chronic disease state;  ?- Assessed social determinant of health barriers;  ? ? ?Anxiety & Depression  (Status:  New goal.)  Long Term Goal ?Evaluation of current treatment plan related to Anxiety and Depression, Lacks knowledge of community resource: Dental & Vision resources,  self-management and patient's adherence to plan as established by provider. ?Discussed plans with patient for ongoing care management follow up and provided patient with direct contact information for care management team ?Evaluation of current treatment plan related to Anxiety & Depression and patient's adherence to plan as established by provider ?Advised patient to discuss the possibly of use of anti-anxiety medication at next medical office visit ?Reviewed medications with patient and discussed use of antidepressant ?Reviewed scheduled/upcoming provider appointments including ?Social Work referral for [place] resources ?Screening for signs and symptoms of depression related to chronic disease state  ?Assessed social determinant of health barriers  ? ?Patient Goals/Self-Care Activities: ?Take medications as prescribed   ?Attend all scheduled provider appointments ?Call pharmacy for medication refills 3-7 days in advance of running out of medications ?Call provider office for new concerns or questions  ?Work with the Education officer, museum to address care coordination needs and will continue to work with the clinical team to address health care and disease management related needs ?  ?  ?  ?

## 2021-08-07 NOTE — Patient Outreach (Signed)
Medicaid Managed Care Social Work Note  08/07/2021 Name:  HAYLO FAKE MRN:  [id number] [name]:  04-17-1981  Leah Baker is an 41 y.o. year old female who is a primary patient of [name] Burnett Harry, MD.  The Medicaid Managed Care Coordination team was consulted for assistance with:  Community Resources   Ms. Shivley was given information about Medicaid Managed Care Coordination team services today. Leah Baker Patient agreed to services and verbal consent obtained.  Engaged with patient  for by telephone forinitial visit in response to referral for case management and/or care coordination services.   Assessments/Interventions:  Review of past medical history, allergies, medications, health status, including review of consultants reports, laboratory and other test data, was performed as part of comprehensive evaluation and provision of chronic care management services.  SDOH: (Social Determinant of Health) assessments and interventions performed: 08/07/21: BSW completed telephone outreach with patient today. Patient states she is in need of dental and vision resources. Patient states she would also like some assistance pretaining to hair loss. She has been on strong hormones. BSW will send patients PCP and OB a message about a referral or assistance with hair loss. BSW will email resources to [email or web address].  Advanced Directives Status:  Not addressed in this encounter.  Care Plan                 Allergies  Allergen Reactions   Latex Itching and Rash    Medications Reviewed Today     Reviewed by Inge Rise, RN (Case Manager) on 07/24/21 at [place] List Status: <None>   Medication Order Taking? Sig Documenting Provider Last Dose Status Informant  acetaminophen (TYLENOL) 500 MG tablet [id number] Yes Take 1 tablet (500 mg total) by mouth every 6 (six) hours as needed. Chase Picket, MD Taking Active Self  bupivacaine liposome (EXPAREL) 1.3 % injection 266 mg [id number]    Stark Klein, MD  Active   Ferrous Sulfate (IRON) 325 (65 Fe) MG TABS [id number] Yes Take by mouth in the morning and at bedtime. [provider] Taking Active Self           Med Note [facility], [name]   Wed Jul 24, 2021  9:37 AM) Taking 3 times a day  fluticasone (FLONASE) 50 MCG/ACT nasal spray [id number] Yes Place 2 sprays into both nostrils daily. Elsie Stain, MD Taking Active            Med Note [facility], [facility] A   Wed Jul 24, 2021  9:37 AM) PRN  hydrocortisone (ANUSOL-HC) 2.5 % rectal cream [id number] Yes Place 1 application rectally 2 (two) times daily. Elsie Stain, MD Taking Active            Med Note [facility], [facility] A   Wed Jul 24, 2021  9:37 AM) PRN  Menthol-Camphor Johny Sax BALM ARTHRITIS RUB [place]) [id number] Yes Apply 1 application topically daily as needed (knee pain). [provider] Taking Active Self  norethindrone (AYGESTIN) 5 MG tablet [id number] Yes Take 3 tablets (15 mg total) by mouth daily. Elsie Stain, MD Taking Active   sertraline (ZOLOFT) 100 MG tablet [id number] Yes Take 1 tablet (100 mg total) by mouth at bedtime. Elsie Stain, MD Taking Active             Patient Active Problem List   Diagnosis Date Noted   Carrier of hemochromatosis HFE gene mutation 08/07/2020   Lymphoid hyperplasia 07/30/2020   Anxiety 06/22/2020  Menorrhagia 11/27/2018   Rectal bleeding 11/26/2018   Liver lesion 09/09/2018   Hemorrhoid 11/29/2014   Iron deficiency anemia 05/04/2014   Mood disorder ([place]) 02/20/2014   Tears of meniscus and ACL of left knee 09/08/2013   Depression 05/14/2011   H/O: cesarean section 08/28/2010    Conditions to be addressed/monitored per PCP order:   dental/vision and hair loss  Care Plan : [place] of Care  Updates made by Ethelda Chick since 08/07/2021 12:00 AM     Problem: Chronic Disease Management and Care Coordination Needs for Iron Deficiency Anemia, Internal hemorrhoid, Anxiety &  Depression   Priority: High     Long-Range Goal: Development of Plan of Care for Chronic Disease Management and Care Coordination Needs (Iron Deficiency Anemia & Bleeding due to internal hemorroid; Anxiety & Depression)   Start Date: 07/24/2021  Expected End Date: 11/21/2021  Priority: High  Note:   Current Barriers:  Knowledge Deficits related to plan of care for management of Anxiety with Excessive Worry, Social Anxiety, and Depression: anxiety loss of energy/fatigue disturbed sleep decreased appetite and Iron Deficiency Anemia & Bleeding due to internal hemorrhoid  Care Coordination needs related to Lacks knowledge of community resource: Dental & Vision resources  Chronic Disease Management support and education needs related to Anxiety with Excessive Worry, Social Anxiety,, Depression: anxiety loss of energy/fatigue disturbed sleep decreased appetite, and Iron Deficiency Anemia  and Bleeding due to internal hemorroid  RNCM Clinical Goal(s):  RNCM will help patient verbalize understanding of plan for management of Anxiety, Depression, and Iron Deficiency Anemia & bleeding due to internal hemorrhoid as evidenced by providing education regarding these chronic diseases. verbalize basic understanding of Anxiety, Depression, and Iron Deficiency Anemia & bleeding due to internal hemorrhoid disease process and self health management plan as evidenced by improved management of these chronic diseases take all medications exactly as prescribed and will call provider for medication related questions as evidenced by medication compliance    attend all scheduled medical appointments: 08/08/2021 Dr. Irene Limbo (hematologist)  as evidenced by attending all scheduled appointments        demonstrate ongoing adherence to prescribed treatment plan for Anxiety, Depression, and Iron Deficiency Anemia & bleeding due to internal hemorrhoid as evidenced by patient being compliant with treatment plan. continue to work  with Consulting civil engineer and/or Social Worker to address care management and care coordination needs related to Anxiety, Depression, and Iron Deficiency Anemia & Bleeding due to internal hemorrhoid  as evidenced by adherence to CM Team Scheduled appointments     work with Education officer, museum to address Lacks knowledge of community resource: Dental and Vision resources related to the management of Anxiety, Depression, and Iron Deficiency Anemia & bleeding due to internal hemorrhoid as evidenced by review of EMR and patient or Education officer, museum report     through collaboration with Consulting civil engineer, provider, and care team.   Interventions: Inter-disciplinary care team collaboration (see longitudinal plan of care) Evaluation of current treatment plan related to  self management and patient's adherence to plan as established by provider 08/07/21: BSW completed telephone outreach with patient today. Patient states she is in need of dental and vision resources. Patient states she would also like some assistance pretaining to hair loss. She has been on strong hormones. BSW will send patients PCP and OB a message about a referral or assistance with hair loss. BSW will email resources to [email or web address].   Anemia/Bleeding:  (Status: New goal.) Long Term Goal  Assessment of understanding of anemia/bleeding disorder diagnosis Basic overview and discussion of anemia/bleeding disorder or acute disease state Review of most recent labs:  Lab Results  Component Value Date   WBC 6.4 02/07/2021   HGB 11.8 (L) 02/07/2021   HCT 36.7 02/07/2021   MCV 81.4 02/07/2021   PLT 454 (H) 02/07/2021   Medications reviewed - Counseled on avoidance of NSAIDs due to increased bleeding risk; - Counseled on importance of regular laboratory monitoring as directed by provider; - recommended promotion of rest and energy-conserving measures to manage fatigue, such as balancing activity with periods of rest - Screening for signs and symptoms  of depression related to chronic disease state;  - Assessed social determinant of health barriers;    Anxiety & Depression  (Status:  New goal.)  Long Term Goal Evaluation of current treatment plan related to Anxiety and Depression, Lacks knowledge of community resource: Dental & Vision resources,  self-management and patient's adherence to plan as established by provider. Discussed plans with patient for ongoing care management follow up and provided patient with direct contact information for care management team Evaluation of current treatment plan related to Anxiety & Depression and patient's adherence to plan as established by provider Advised patient to discuss the possibly of use of anti-anxiety medication at next medical office visit Reviewed medications with patient and discussed use of antidepressant Reviewed scheduled/upcoming provider appointments including Social Work referral for [place] resources Screening for signs and symptoms of depression related to chronic disease state  Assessed social determinant of health barriers   Patient Goals/Self-Care Activities: Take medications as prescribed   Attend all scheduled provider appointments Call pharmacy for medication refills 3-7 days in advance of running out of medications Call provider office for new concerns or questions  Work with the social worker to address care coordination needs and will continue to work with the clinical team to address health care and disease management related needs       Follow up:  Patient agrees to Care Plan and Follow-up.  Plan: The Managed Medicaid care management team will reach out to the patient again over the next 12 days.  Date/time of next scheduled Social Work care management/care coordination outreach:  08/23/21  Mickel Fuchs, Arita Miss, [place] Medicaid Team  [phone]

## 2021-08-08 ENCOUNTER — Ambulatory Visit: Payer: Medicaid Other | Admitting: Hematology

## 2021-08-08 ENCOUNTER — Other Ambulatory Visit: Payer: Medicaid Other

## 2021-08-08 NOTE — Progress Notes (Signed)
I will refer her to dermatology.  ?

## 2021-08-08 NOTE — Addendum Note (Signed)
Addended by: Asencion Noble E on: 08/08/2021 03:46 PM ? ? Modules accepted: Orders ? ?

## 2021-08-19 ENCOUNTER — Other Ambulatory Visit: Payer: Self-pay

## 2021-08-19 DIAGNOSIS — D500 Iron deficiency anemia secondary to blood loss (chronic): Secondary | ICD-10-CM

## 2021-08-20 ENCOUNTER — Inpatient Hospital Stay (HOSPITAL_BASED_OUTPATIENT_CLINIC_OR_DEPARTMENT_OTHER): Payer: Medicaid Other | Admitting: Hematology

## 2021-08-20 ENCOUNTER — Other Ambulatory Visit: Payer: Self-pay

## 2021-08-20 ENCOUNTER — Inpatient Hospital Stay: Payer: Medicaid Other | Attending: Hematology

## 2021-08-20 VITALS — BP 148/98 | HR 86 | Temp 97.9°F | Resp 20 | Wt 169.2 lb

## 2021-08-20 DIAGNOSIS — Z87891 Personal history of nicotine dependence: Secondary | ICD-10-CM | POA: Insufficient documentation

## 2021-08-20 DIAGNOSIS — D509 Iron deficiency anemia, unspecified: Secondary | ICD-10-CM | POA: Diagnosis not present

## 2021-08-20 DIAGNOSIS — D500 Iron deficiency anemia secondary to blood loss (chronic): Secondary | ICD-10-CM

## 2021-08-20 LAB — CBC WITH DIFFERENTIAL/PLATELET
Abs Immature Granulocytes: 0.01 10*3/uL (ref 0.00–0.07)
Basophils Absolute: 0.1 10*3/uL (ref 0.0–0.1)
Basophils Relative: 1 %
Eosinophils Absolute: 0.2 10*3/uL (ref 0.0–0.5)
Eosinophils Relative: 4 %
HCT: 36.5 % (ref 36.0–46.0)
Hemoglobin: 12.3 g/dL (ref 12.0–15.0)
Immature Granulocytes: 0 %
Lymphocytes Relative: 35 %
Lymphs Abs: 2.1 10*3/uL (ref 0.7–4.0)
MCH: 29.3 pg (ref 26.0–34.0)
MCHC: 33.7 g/dL (ref 30.0–36.0)
MCV: 86.9 fL (ref 80.0–100.0)
Monocytes Absolute: 0.6 10*3/uL (ref 0.1–1.0)
Monocytes Relative: 9 %
Neutro Abs: 3 10*3/uL (ref 1.7–7.7)
Neutrophils Relative %: 51 %
Platelets: 405 10*3/uL — ABNORMAL HIGH (ref 150–400)
RBC: 4.2 MIL/uL (ref 3.87–5.11)
RDW: 13.3 % (ref 11.5–15.5)
WBC: 6 10*3/uL (ref 4.0–10.5)
nRBC: 0 % (ref 0.0–0.2)

## 2021-08-20 LAB — CMP (CANCER CENTER ONLY)
ALT: 13 U/L (ref 0–44)
AST: 18 U/L (ref 15–41)
Albumin: 4.5 g/dL (ref 3.5–5.0)
Alkaline Phosphatase: 40 U/L (ref 38–126)
Anion gap: 7 (ref 5–15)
BUN: 15 mg/dL (ref 6–20)
CO2: 23 mmol/L (ref 22–32)
Calcium: 9.7 mg/dL (ref 8.9–10.3)
Chloride: 109 mmol/L (ref 98–111)
Creatinine: 0.88 mg/dL (ref 0.44–1.00)
GFR, Estimated: >60 mL/min (ref >60)
Glucose, Bld: 99 mg/dL (ref 70–99)
Potassium: 3.6 mmol/L (ref 3.5–5.1)
Sodium: 139 mmol/L (ref 135–145)
Total Bilirubin: 0.4 mg/dL (ref 0.3–1.2)
Total Protein: 7.6 g/dL (ref 6.5–8.1)

## 2021-08-20 LAB — IRON AND IRON BINDING CAPACITY (CC-WL,HP ONLY)
Iron: 140 ug/dL (ref 28–170)
Saturation Ratios: 29 % (ref 10.4–31.8)
TIBC: 479 ug/dL — ABNORMAL HIGH (ref 250–450)
UIBC: 339 ug/dL (ref 148–442)

## 2021-08-20 LAB — FERRITIN: Ferritin: 173 ng/mL (ref 11–307)

## 2021-08-20 NOTE — Progress Notes (Signed)
? ? ?HEMATOLOGY/ONCOLOGY CLINIC NOTE ? ?Date of Service: .08/20/2021 ? ? ?Patient Care Team: ?Elsie Stain, MD as PCP - General (Pulmonary Disease) ?Inge Rise, RN as Case Manager ?Ethelda Chick as Social Worker ? ?CHIEF COMPLAINTS/PURPOSE OF CONSULTATION:  ?For continued evaluation and management of iron deficiency anemia ? ?HISTORY OF PRESENTING ILLNESS:  ? ?Please see previous note for details on initial presentation ? ?INTERVAL HISTORY ? ?Leah Baker is a wonderful 41 y.o. female who is here for continued valuation management of iron deficiency anemia. The patient's last visit with [place] was on 02/07/2021.   ? ?She says she is experiencing issues with hemorrhoids and is experiencing bleeding from them.  ?She reports that she has been taking her iron supplementation regularly. ?Last received iron infusions in October 2022 and tolerated this without any toxicities. ?She reports she was having heavy periods and was started on progesterone which has helped control her menorrhagia. ? ?Lab results today - cbc/diff stable hgb 12.3 with MCV of 86.9, plt 405k ?CMP- unremarkable ?Ferritin -173 with an iron saturation of 29% ? ?MEDICAL HISTORY:  ?Past Medical History:  ?Diagnosis Date  ? Abnormal uterine bleeding (AUB)   ? Anemia   ? Phreesia 06/08/2020  ? Anxiety   ? Blood in stool last 2 years  ? Depression   ? Depression   ? [employer]reesia 06/08/2020  ? Hemorrhoids   ? History of blood transfusion 05/2019 2 units  ? iron transfusion given also  ? History of lower GI bleeding dec 2021 active problem  ? s/p  colonoscopy 09-01-2018,  hemorrhoids  ? IDA (iron deficiency anemia)   ? Inguinal lymphadenopathy   ? CT 09-14-2018  right side, followed by pcp  ? PONV (postoperative nausea and vomiting)   ? only happened once  ? Wears glasses   ? Wears glasses   ? ? ?SURGICAL HISTORY: ?Past Surgical History:  ?Procedure Laterality Date  ? BIOPSY  09/01/2018  ? Procedure: BIOPSY;  Surgeon: Otis Brace, MD;   Location: [facility];  Service: Gastroenterology;;  ? CESAREAN SECTION  01/15/2002   '@[facility]'$   ? CESAREAN SECTION  03/17/2011  ? Procedure: CESAREAN SECTION;  Surgeon: Osborne Oman, MD;  Location: [facility];  Service: Gynecology;  Laterality: N/A;  ? COLONOSCOPY WITH PROPOFOL N/A 09/01/2018  ? Procedure: COLONOSCOPY WITH PROPOFOL;  Surgeon: Otis Brace, MD;  Location: [place];  Service: Gastroenterology;  Laterality: N/A;  ? DILITATION & CURRETTAGE/HYSTROSCOPY WITH HYDROTHERMAL ABLATION N/A 08/22/2020  ? Procedure: DILATATION & CURETTAGE/HYSTEROSCOPY CERVICAL REPAIR;  Surgeon: [name], MD;  Location: [facility];  Service: Gynecology;  Laterality: N/A;  ? HYSTEROSCOPY WITH D & C  06-21-2009  dr constant '@[facility]   ? w/  REMOVAL IUD   ? INGUINAL LYMPH NODE BIOPSY Right 04/12/2020  ? Procedure: RIGHT INGUINAL LYMPH NODE EXCISIONAL BIOPSY;  Surgeon: Stark Klein, MD;  Location: [place];  Service: General;  Laterality: Right;  ? IUD REMOVAL  06/21/2009  ? KNEE ARTHROSCOPY WITH ANTERIOR CRUCIATE LIGAMENT (ACL) REPAIR Left 09-08-2013   dr [name]  '@[facility]'$   ? w/  meniscal repair  ? MULTIPLE TOOTH EXTRACTIONS  2012  ? RECTAL EXAM UNDER ANESTHESIA N/A 11/18/2018  ? Procedure: ANORECTAL EXAM UNDER ANESTHESIA;  Surgeon: Ileana Roup, MD;  Location: [facility];  Service: General;  Laterality: N/A;  ? TUBAL LIGATION Bilateral 2012  ? ? ?SOCIAL HISTORY: ?Social History  ? ?Socioeconomic History  ? Marital status: Significant Other  ?  Spouse name: Not on file  ? Number of children: Not on file  ? Years of education: Not on file  ? Highest education level: Not on file  ?Occupational History  ? Not on file  ?Tobacco Use  ? Smoking status: Former  ?  Years: 5.00  ?  Types: Cigarettes  ?  Quit date: 08/22/2008  ?  Years since quitting: 13.0  ? Smokeless tobacco: Never  ?Vaping Use  ? Vaping Use: Never used  ?Substance and Sexual Activity  ? Alcohol use: Not Currently  ? Drug use: Not Currently  ?   Comment: per pt younger in college marijuana, none since  ? Sexual activity: Yes  ?  Birth control/protection: Surgical  ?Other Topics Concern  ? Not on file  ?Social History Narrative  ? Not on file  ? ?Social Determinants of Health  ? ?Financial Resource Strain: Low Risk   ? Difficulty of Paying Living Expenses: Not hard at all  ?Food Insecurity: No Food Insecurity  ? Worried About Charity fundraiser in the Last Year: Never true  ? Ran Out of Food in the Last Year: Never true  ?Transportation Needs: No Transportation Needs  ? Lack of Transportation (Medical): No  ? Lack of Transportation (Non-Medical): No  ?Physical Activity: Insufficiently Active  ? Days of Exercise per Week: 2 days  ? Minutes of Exercise per Session: 20 min  ?Stress: Stress Concern Present  ? Feeling of Stress : Very much  ?Social Connections: Moderately Integrated  ? Frequency of Communication with Friends and Family: More than three times a week  ? Frequency of Social Gatherings with Friends and Family: More than three times a week  ? Attends Religious Services: More than 4 times per year  ? Active Member of Clubs or Organizations: No  ? Attends Archivist Meetings: Never  ? Marital Status: Living with partner  ?Intimate Partner Violence: Not on file  ? ? ?FAMILY HISTORY: ?Family History  ?Problem Relation Age of Onset  ? Cancer Mother   ? Kidney failure Mother   ? Healthy Father   ? ? ?ALLERGIES:  is allergic to latex. ? ?MEDICATIONS:  ?Current Outpatient Medications  ?Medication Sig Dispense Refill  ? acetaminophen (TYLENOL) 500 MG tablet Take 1 tablet (500 mg total) by mouth every 6 (six) hours as needed. 30 tablet 0  ? Ferrous Sulfate (IRON) 325 (65 Fe) MG TABS Take by mouth in the morning and at bedtime.    ? fluticasone (FLONASE) 50 MCG/ACT nasal spray Place 2 sprays into both nostrils daily. 16 g 6  ? hydrocortisone (ANUSOL-HC) 2.5 % rectal cream Place 1 application rectally 2 (two) times daily. 30 g 0  ? Menthol-Camphor  (TIGER BALM ARTHRITIS RUB EX) Apply 1 application topically daily as needed (knee pain).    ? norethindrone (AYGESTIN) 5 MG tablet Take 3 tablets (15 mg total) by mouth daily. 90 tablet 3  ? sertraline (ZOLOFT) 100 MG tablet Take 1 tablet (100 mg total) by mouth at bedtime. 60 tablet 3  ? ?No current facility-administered medications for this visit.  ? ?Facility-Administered Medications Ordered in Other Visits  ?Medication Dose Route Frequency Provider Last Rate Last Admin  ? bupivacaine liposome (EXPAREL) 1.3 % injection 266 mg  20 mL Infiltration Once Stark Klein, MD      ? ? ?REVIEW OF SYSTEMS:   ?10 Point review of Systems was done is negative except as noted above. ? ? ?PHYSICAL EXAMINATION: ?ECOG PERFORMANCE STATUS: 2 -  Symptomatic, <50% confined to bed ? ?. ?Vitals:  ? 08/20/21 0918  ?BP: (!) 148/98  ?Pulse: 86  ?Resp: 20  ?Temp: 97.9 ?F (36.6 ?C)  ?SpO2: 100%  ? ? ?Filed Weights  ? 08/20/21 0918  ?Weight: 169 lb 3.2 oz (76.7 kg)  ? ? ?.Body mass index is 30.95 kg/m?Marland Kitchen ?NAD ?GENERAL:alert, in no acute distress and comfortable ?SKIN: no acute rashes, no significant lesions ?EYES: conjunctiva are pink and non-injected, sclera anicteric ?OROPHARYNX: MMM, no exudates, no oropharyngeal erythema or ulceration ?NECK: supple, no JVD ?LYMPH:  no palpable lymphadenopathy in the cervical, axillary or inguinal regions ?LUNGS: clear to auscultation b/l with normal respiratory effort ?HEART: regular rate & rhythm ?ABDOMEN:  normoactive bowel sounds , non tender, not distended. ?Extremity: no pedal edema ?PSYCH: alert & oriented x 3 with fluent speech ?NEURO: no focal motor/sensory deficits ? ?LABORATORY DATA:  ?I have reviewed the data as listed ? ?. ?CBC Latest Ref Rng & Units 08/20/2021 02/07/2021 08/22/2020  ?WBC 4.0 - 10.5 K/uL 6.0 6.4 6.1  ?Hemoglobin 12.0 - 15.0 g/dL 12.3 11.8(L) 11.0(L)  ?Hematocrit 36.0 - 46.0 % 36.5 36.7 34.7(L)  ?Platelets 150 - 400 K/uL 405(H) 454(H) 486(H)  ? ? ?. ? ?  Latest Ref Rng & Units  08/20/2021  ?  8:56 AM 02/07/2021  ?  8:42 AM 07/30/2020  ?  9:06 AM  ?CMP  ?Glucose 70 - 99 mg/dL 99   124   95    ?BUN 6 - 20 mg/dL '15   11   12    '$ ?Creatinine 0.44 - 1.00 mg/dL 0.88   1.04   1.01    ?Sodium 135 - 145

## 2021-08-21 ENCOUNTER — Other Ambulatory Visit: Payer: Self-pay

## 2021-08-21 ENCOUNTER — Telehealth: Payer: Self-pay | Admitting: Hematology

## 2021-08-21 NOTE — Patient Instructions (Signed)
Visit Information ? ?Ms. Tabron was given information about Medicaid Managed Care team care coordination services as a part of their [place] Medicaid benefit. [name] verbally consented to engagement with the [facility] Managed Care team.  ? ?If you are experiencing a medical emergency, please call 911 or report to your local emergency department or urgent care.  ? ?If you have a non-emergency medical problem during routine business hours, please contact your provider's office and ask to speak with a nurse.  ? ?For questions related to your [facility], please call: [phone] or visit the homepage here: [email or web address] ? ?If you would like to schedule transportation through your [facility], please call the following number at least 2 days in advance of your appointment: [phone]. ? Rides for urgent appointments can also be made after hours by calling Member Services. ? ?Call the [facility] at [phone], at any time, 24 hours a day, 7 days a week. If you are in danger or need immediate medical attention call 911. ? ?If you would like help to quit smoking, call 1-800-QUIT-NOW (220)541-0668) OR Espa?ol: 1-855-D?jelo-Ya 906-053-8412) o para m?s informaci?n haga clic aqu? or Text READY to 200-400 to register via text ? ?Ms. Dumais - following are the goals we discussed in your visit today:  Please see Patient's Goals in the [facility] of [facility] below. ? ?Please see education materials related to today's visit provided by MyChart link. ? ?Patient verbalizes understanding of instructions and care plan provided today and agrees to view in [facility]. Active MyChart status confirmed with patient.   ? ?The Managed Medicaid care management team will reach out to the patient again over the next 50 days.  ? ?Salvatore Marvel RN, BSN ?Community Care  Coordinator ?[facility] ?Mobile: [phone]  ? ?Following is a copy of your plan of care:  ?Care Plan : [facility] of Care  ?Updates made by Inge Rise, RN since 08/21/2021 12:00 AM  ?  ? ?Problem: Chronic Disease Management and Care Coordination Needs for Iron Deficiency Anemia, Internal hemorrhoid, Anxiety & Depression   ?Priority: High  ?  ? ?Long-Range Goal: Development of Plan of Care for Chronic Disease Management and Care Coordination Needs (Iron Deficiency Anemia & Bleeding due to internal hemorroid; Anxiety & Depression)   ?Start Date: 07/24/2021  ?Expected End Date: 11/21/2021  ?Priority: High  ?Note:   ?Current Barriers:  ?Knowledge Deficits related to plan of care for management of Anxiety with Excessive Worry, ?Social Anxiety, and Depression: anxiety ?loss of energy/fatigue ?disturbed sleep ?decreased appetite and Iron Deficiency Anemia & Bleeding due to internal hemorrhoid  ?Care Coordination needs related to Lacks knowledge of community resource: Dental & Vision resources  ?Chronic Disease Management support and education needs related to Anxiety with Excessive Worry, ?Social Anxiety,, Depression: anxiety ?loss of energy/fatigue ?disturbed sleep ?decreased appetite, and Iron Deficiency Anemia  and Bleeding due to internal hemorroid ? ?RNCM Clinical Goal(s):  ?RNCM will help patient verbalize understanding of plan for management of Anxiety, Depression, and Iron Deficiency Anemia & bleeding due to internal hemorrhoid as evidenced by providing education regarding these chronic diseases. ?verbalize basic understanding of Anxiety, Depression, and Iron Deficiency Anemia & bleeding due to internal hemorrhoid disease process and self health management plan as evidenced by improved management of these chronic diseases ?take all medications exactly as prescribed and will call provider for medication related questions  as evidenced by medication compliance    ?attend  all scheduled medical appointments: 09/09/21 Dr Kennon Rounds - Annual GYN visit; 09/17/21 PCP office visit as evidenced by attending all scheduled appointments        ?demonstrate ongoing adherence to prescribed treatment plan for Anxiety, Depression, and Iron Deficiency Anemia & bleeding due to internal hemorrhoid as evidenced by patient being compliant with treatment plan. ?continue to work with Consulting civil engineer and/or Social Worker to address care management and care coordination needs related to Anxiety, Depression, and Iron Deficiency Anemia & Bleeding due to internal hemorrhoid  as evidenced by adherence to CM Team Scheduled appointments     ?work with Education officer, museum to address Lacks knowledge of community resource: Dental and Vision resources related to the management of Anxiety, Depression, and Iron Deficiency Anemia & bleeding due to internal hemorrhoid as evidenced by review of EMR and patient or Education officer, museum report     through collaboration with Consulting civil engineer, provider, and care team.  ? ?Interventions: ?Inter-disciplinary care team collaboration (see longitudinal plan of care) ?Evaluation of current treatment plan related to  self management and patient's adherence to plan as established by provider ?08/07/21: BSW completed telephone outreach with patient today. Patient states she is in need of dental and vision resources. Patient states she would also like some assistance pretaining to hair loss. She has been on strong hormones. BSW will send patients PCP and OB a message about a referral or assistance with hair loss. BSW will email resources to [email or web address]. ? ? ?Anemia/Bleeding:  (Status: Goal on Track (progressing): YES.) Long Term Goal  ?Assessment of understanding of anemia/bleeding disorder diagnosis ?Basic overview and discussion of anemia/bleeding disorder or acute disease state ?Review of most recent labs:  ?Lab Results  ?Component Value Date  ? WBC 6.0 08/20/2021  ? HGB 12.3 08/20/2021  ? HCT 36.5  08/20/2021  ? MCV 86.9 08/20/2021  ? PLT 405 (H) 08/20/2021  ? ?  ?Lab Results  ?Component Value Date  ? IRON 140 08/20/2021  ? TIBC 479 (H) 08/20/2021  ? FERRITIN 173 08/20/2021  ?   ?Medications reviewed ?- Counseled on importance of regular laboratory monitoring as directed by provider; ?- recommended promotion of rest and energy-conserving measures to manage fatigue, such as balancing activity with periods of rest ?- encouraged dietary changes to increase dietary intake of iron, Vitamin I94 and folic acid as advised/prescribed ?Recent lab results reviewed.  Lab work results greatly improved from 6 months ago.  Patient reports feeling better and having "energy spurts" recently.  Patient states that she has changed her eating habits and now focused on increased iron intake through food items such as green leafy vegetables including kale & spinach.  Patient eating 2 vegetables, a meat and eliminating carbohydrate/starch food item at mealtime.  This RN Care Manager encouraged patient to not over due her physical activity when she has these "energy spurts", but to listen to her body when she needs to rest and take a break. I provided encouragement for the changes she is making to improve her overall health and how she is her best advocate for her own health.   ? ? ?Anxiety & Depression  (Status:  Goal on track:  Yes.)  Long Term Goal ?Evaluation of current treatment plan related to Anxiety and Depression, Lacks knowledge of community resource: Dental & Vision resources,  self-management and patient's adherence to plan as established by provider. ?Discussed plans with patient for ongoing care management follow up  and provided patient with direct contact information for care management team ?Evaluation of current treatment plan related to Anxiety & Depression and patient's adherence to plan as established by provider ?Advised patient to discuss the possibly of use of anti-anxiety medication at next medical office  visit ?Reviewed medications with patient and discussed use of antidepressant ?Reviewed scheduled/upcoming provider appointments including ?Social Work referral for [place] resources ?Screening for signs

## 2021-08-21 NOTE — Patient Outreach (Signed)
?Medicaid Managed Care   ?Nurse Care Manager Note ? ?08/21/2021 ?Name:  Leah Baker MRN:  [id number] [name]:  09-May-1981 ? ?Leah Baker is an 41 y.o. year old female who is a primary patient of [name], Burnett Harry, MD.  The [facility] Managed Care Coordination team was consulted for assistance with:    ?Anxiety ?Depression ?Iron Deficiency Anemia due to internal hemorrhoid ? ?[name] was given information about Medicaid Managed Care Coordination team services today. Leah Baker Patient agreed to services and verbal consent obtained. ? ?Engaged with patient by telephone for follow up visit in response to provider referral for case management and/or care coordination services.  ? ?Assessments/Interventions:  Review of past medical history, allergies, medications, health status, including review of consultants reports, laboratory and other test data, was performed as part of comprehensive evaluation and provision of chronic care management services. ? ?SDOH (Social Determinants of Health) assessments and interventions performed: ? ? ?Care Plan ? ?Allergies  ?Allergen Reactions  ? Latex Itching and Rash  ? ? ?Medications Reviewed Today   ? ? Reviewed by Inge Rise, RN (Case Manager) on 08/21/21 at [phone]  Med List Status: <None>  ? ?Medication Order Taking? Sig Documenting Provider Last Dose Status Informant  ?acetaminophen (TYLENOL) 500 MG tablet [id number] No Take 1 tablet (500 mg total) by mouth every 6 (six) hours as needed. Chase Picket, MD Taking Active Self  ?bupivacaine liposome (EXPAREL) 1.3 % injection 266 mg [id number]   Stark Klein, MD  Active   ?Ferrous Sulfate (IRON) 325 (65 Fe) MG TABS [id number] No Take by mouth in the morning and at bedtime. [provider] Taking Active Self  ?         ?Med Note [facility], Veta Dambrosia A   Wed Jul 24, 2021  9:37 AM) Taking 3 times a day  ?fluticasone (FLONASE) 50 MCG/ACT nasal spray [id number] No Place 2 sprays into both nostrils daily. Elsie Stain, MD Taking Active   ?         ?Med Note [facility], Dwyane Dupree A   Wed Jul 24, 2021  9:37 AM) PRN  ?hydrocortisone (ANUSOL-HC) 2.5 % rectal cream [id number] No Place 1 application rectally 2 (two) times daily. Elsie Stain, MD Taking Active   ?         ?Med Note [facility], Baelyn Doring A   Wed Jul 24, 2021  9:37 AM) PRN  ?Menthol-Camphor (TIGER BALM ARTHRITIS RUB EX) [id number] No Apply 1 application topically daily as needed (knee pain). [provider] Taking Active Self  ?norethindrone (AYGESTIN) 5 MG tablet [id number] No Take 3 tablets (15 mg total) by mouth daily. Elsie Stain, MD Taking Active   ?sertraline (ZOLOFT) 100 MG tablet [id number] No Take 1 tablet (100 mg total) by mouth at bedtime. Elsie Stain, MD Taking Active   ? ?  ?  ? ?  ? ? ?Patient Active Problem List  ? Diagnosis Date Noted  ? 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 Lymphoid hyperplasia 07/30/2020  ? Anxiety 06/22/2020  ? Menorrhagia 11/27/2018  ? Rectal bleeding 11/26/2018  ? Liver lesion 09/09/2018  ? Hemorrhoid 11/29/2014  ? Iron deficiency anemia 05/04/2014  ? Mood disorder ([place]) 02/20/2014  ? Tears of meniscus and ACL of left knee 09/08/2013  ? Depression 05/14/2011  ? H/O: cesarean section 08/28/2010  ? ? ?Conditions to be addressed/monitored per PCP order:  Anxiety, Depression, and Iron Deficiency Anemia due to internal hemorroid ? ?Care  Plan : RN Care Manager Plan of Care  ?Updates made by Inge Rise, RN since 08/21/2021 12:00 AM  ?  ? ?Problem: Chronic Disease Management and Care Coordination Needs for Iron Deficiency Anemia, Internal hemorrhoid, Anxiety & Depression   ?Priority: High  ?  ? ?Long-Range Goal: Development of Plan of Care for Chronic Disease Management and Care Coordination Needs (Iron Deficiency Anemia & Bleeding due to internal hemorroid; Anxiety & Depression)   ?Start Date: 07/24/2021  ?Expected End Date: 11/21/2021  ?Priority: High  ?Note:   ?Current Barriers:   ?Knowledge Deficits related to plan of care for management of Anxiety with Excessive Worry, ?Social Anxiety, and Depression: anxiety ?loss of energy/fatigue ?disturbed sleep ?decreased appetite and Iron Deficiency Anemia & Bleeding due to internal hemorrhoid  ?Care Coordination needs related to Lacks knowledge of community resource: Dental & Vision resources  ?Chronic Disease Management support and education needs related to Anxiety with Excessive Worry, ?Social Anxiety,, Depression: anxiety ?loss of energy/fatigue ?disturbed sleep ?decreased appetite, and Iron Deficiency Anemia  and Bleeding due to internal hemorroid ? ?RNCM Clinical Goal(s):  ?RNCM will help patient verbalize understanding of plan for management of Anxiety, Depression, and Iron Deficiency Anemia & bleeding due to internal hemorrhoid as evidenced by providing education regarding these chronic diseases. ?verbalize basic understanding of Anxiety, Depression, and Iron Deficiency Anemia & bleeding due to internal hemorrhoid disease process and self health management plan as evidenced by improved management of these chronic diseases ?take all medications exactly as prescribed and will call provider for medication related questions as evidenced by medication compliance    ?attend all scheduled medical appointments: 09/09/21 Dr Kennon Rounds - Annual GYN visit; 09/17/21 PCP office visit as evidenced by attending all scheduled appointments        ?demonstrate ongoing adherence to prescribed treatment plan for Anxiety, Depression, and Iron Deficiency Anemia & bleeding due to internal hemorrhoid as evidenced by patient being compliant with treatment plan. ?continue to work with Consulting civil engineer and/or Social Worker to address care management and care coordination needs related to Anxiety, Depression, and Iron Deficiency Anemia & Bleeding due to internal hemorrhoid  as evidenced by adherence to CM Team Scheduled appointments     ?work with Education officer, museum to address  Lacks knowledge of community resource: Dental and Vision resources related to the management of Anxiety, Depression, and Iron Deficiency Anemia & bleeding due to internal hemorrhoid as evidenced by review of EMR and patient or Education officer, museum report     through collaboration with Consulting civil engineer, provider, and care team.  ? ?Interventions: ?Inter-disciplinary care team collaboration (see longitudinal plan of care) ?Evaluation of current treatment plan related to  self management and patient's adherence to plan as established by provider ?08/07/21: BSW completed telephone outreach with patient today. Patient states she is in need of dental and vision resources. Patient states she would also like some assistance pretaining to hair loss. She has been on strong hormones. BSW will send patients PCP and OB a message about a referral or assistance with hair loss. BSW will email resources to [email or web address]. ? ? ?Anemia/Bleeding:  (Status: Goal on Track (progressing): YES.) Long Term Goal  ?Assessment of understanding of anemia/bleeding disorder diagnosis ?Basic overview and discussion of anemia/bleeding disorder or acute disease state ?Review of most recent labs:  ?Lab Results  ?Component Value Date  ? WBC 6.0 08/20/2021  ? HGB 12.3 08/20/2021  ? HCT 36.5 08/20/2021  ? MCV 86.9 08/20/2021  ? PLT 405 (H) 08/20/2021  ? ?  ?  Lab Results  ?Component Value Date  ? IRON 140 08/20/2021  ? TIBC 479 (H) 08/20/2021  ? FERRITIN 173 08/20/2021  ?   ?Medications reviewed ?- Counseled on importance of regular laboratory monitoring as directed by provider; ?- recommended promotion of rest and energy-conserving measures to manage fatigue, such as balancing activity with periods of rest ?- encouraged dietary changes to increase dietary intake of iron, Vitamin W44 and folic acid as advised/prescribed ?Recent lab results reviewed.  Lab work results greatly improved from 6 months ago.  Patient reports feeling better and having "energy spurts"  recently.  Patient states that she has changed her eating habits and now focused on increased iron intake through food items such as green leafy vegetables including kale & spinach.  Patient eating 2 vegetables, a

## 2021-08-21 NOTE — Telephone Encounter (Signed)
Left message with follow-up appointment per 3/21 los. ?

## 2021-08-23 ENCOUNTER — Encounter: Payer: Self-pay | Admitting: Hematology

## 2021-08-23 ENCOUNTER — Other Ambulatory Visit: Payer: Self-pay

## 2021-08-23 NOTE — Patient Instructions (Signed)
Visit Information ? ?Leah Baker was given information about Medicaid Managed Care team care coordination services as a part of their [place] benefit. Leah Baker verbally consented to engagement with the [facility] Managed Care team.  ? ?If you are experiencing a medical emergency, please call 911 or report to your local emergency department or urgent care.  ? ?If you have a non-emergency medical problem during routine business hours, please contact your provider's office and ask to speak with a nurse.  ? ?For questions related to your [facility], please call: [phone] or visit the homepage here: [email or web address] ? ?If you would like to schedule transportation through your [facility], please call the following number at least 2 days in advance of your appointment: [phone]. ? Rides for urgent appointments can also be made after hours by calling Member Services. ? ?Call the [place] at [phone], at any time, 24 hours a day, 7 days a week. If you are in danger or need immediate medical attention call 911. ? ?If you would like help to quit smoking, call 1-800-QUIT-NOW 3052139274) OR Espa?ol: 1-855-D?jelo-Ya 320-361-5289) o para m?s informaci?n haga clic aqu? or Text READY to 200-400 to register via text ? ?Leah Baker - following are the goals we discussed in your visit today:  ? Goals Addressed   ?None ?  ? ? ? ?Social Worker will follow up with patient in 30-60 days .  ? ?Mickel Fuchs, BSW, [place] ?[place]  ?High Risk Managed Medicaid Team  ?[phone]  ? ?Following is a copy of your plan of care:  ?Care Plan : [name]  ?Updates made by Ethelda Chick since 08/23/2021 12:00 AM  ?  ? ?Problem: Chronic Disease Management and Care Coordination Needs for Iron Deficiency Anemia, Internal  hemorrhoid, Anxiety & Depression   ?Priority: High  ?  ? ?Long-Range Goal: Development of Plan of Care for Chronic Disease Management and Care Coordination Needs (Iron Deficiency Anemia & Bleeding due to internal hemorroid; Anxiety & Depression)   ?Start Date: 07/24/2021  ?Expected End Date: 11/21/2021  ?Priority: High  ?Note:   ?Current Barriers:  ?Knowledge Deficits related to plan of care for management of Anxiety with Excessive Worry, ?Social Anxiety, and Depression: anxiety ?loss of energy/fatigue ?disturbed sleep ?decreased appetite and Iron Deficiency Anemia & Bleeding due to internal hemorrhoid  ?Care Coordination needs related to Lacks knowledge of community resource: Dental & Vision resources  ?Chronic Disease Management support and education needs related to Anxiety with Excessive Worry, ?Social Anxiety,, Depression: anxiety ?loss of energy/fatigue ?disturbed sleep ?decreased appetite, and Iron Deficiency Anemia  and Bleeding due to internal hemorroid ? ?RNCM Clinical Goal(s):  ?RNCM will help patient verbalize understanding of plan for management of Anxiety, Depression, and Iron Deficiency Anemia & bleeding due to internal hemorrhoid as evidenced by providing education regarding these chronic diseases. ?verbalize basic understanding of Anxiety, Depression, and Iron Deficiency Anemia & bleeding due to internal hemorrhoid disease process and self health management plan as evidenced by improved management of these chronic diseases ?take all medications exactly as prescribed and will call provider for medication related questions as evidenced by medication compliance    ?attend all scheduled medical appointments: 09/09/21 Dr Kennon Rounds - Annual GYN visit; 09/17/21 PCP office visit as evidenced by attending all scheduled appointments        ?demonstrate ongoing adherence to prescribed treatment plan  for Anxiety, Depression, and Iron Deficiency Anemia & bleeding due to internal hemorrhoid as evidenced by patient being  compliant with treatment plan. ?continue to work with Consulting civil engineer and/or Social Worker to address care management and care coordination needs related to Anxiety, Depression, and Iron Deficiency Anemia & Bleeding due to internal hemorrhoid  as evidenced by adherence to CM Team Scheduled appointments     ?work with Education officer, museum to address Lacks knowledge of community resource: Dental and Vision resources related to the management of Anxiety, Depression, and Iron Deficiency Anemia & bleeding due to internal hemorrhoid as evidenced by review of EMR and patient or Education officer, museum report     through collaboration with Consulting civil engineer, provider, and care team.  ? ?Interventions: ?Inter-disciplinary care team collaboration (see longitudinal plan of care) ?Evaluation of current treatment plan related to  self management and patient's adherence to plan as established by provider ?08/07/21: BSW completed telephone outreach with patient today. Patient states she is in need of dental and vision resources. Patient states she would also like some assistance pretaining to hair loss. She has been on strong hormones. BSW will send patients PCP and OB a message about a referral or assistance with hair loss. BSW will email resources to [email or web address]. ?08/23/21: BSW completed telephone follow up with patient. She stated she did receive the dental resources BSW sent to her, however she does have bad anxiety when it comes to the dentist and wanted to speak with her PCP about it. Her PCP did complete a referral for the dermatologist and she does have a follow up appointment with her OB for medication adjustments. No other resources are needed at this time. ? ? ?Anemia/Bleeding:  (Status: Goal on Track (progressing): YES.) Long Term Goal  ?Assessment of understanding of anemia/bleeding disorder diagnosis ?Basic overview and discussion of anemia/bleeding disorder or acute disease state ?Review of most recent labs:  ?Lab Results   ?Component Value Date  ? WBC 6.0 08/20/2021  ? HGB 12.3 08/20/2021  ? HCT 36.5 08/20/2021  ? MCV 86.9 08/20/2021  ? PLT 405 (H) 08/20/2021  ? ?  ?Lab Results  ?Component Value Date  ? IRON 140 08/20/2021  ? TIBC 479 (H) 08/20/2021  ? FERRITIN 173 08/20/2021  ?   ?Medications reviewed ?- Counseled on importance of regular laboratory monitoring as directed by provider; ?- recommended promotion of rest and energy-conserving measures to manage fatigue, such as balancing activity with periods of rest ?- encouraged dietary changes to increase dietary intake of iron, Vitamin H06 and folic acid as advised/prescribed ?Recent lab results reviewed.  Lab work results greatly improved from 6 months ago.  Patient reports feeling better and having "energy spurts" recently.  Patient states that she has changed her eating habits and now focused on increased iron intake through food items such as green leafy vegetables including kale & spinach.  Patient eating 2 vegetables, a meat and eliminating carbohydrate/starch food item at mealtime.  This RN Care Manager encouraged patient to not over due her physical activity when she has these "energy spurts", but to listen to her body when she needs to rest and take a break. I provided encouragement for the changes she is making to improve her overall health and how she is her best advocate for her own health.   ? ? ?Anxiety & Depression  (Status:  Goal on track:  Yes.)  Long Term Goal ?Evaluation of current treatment plan related to Anxiety and Depression, Lacks knowledge of  community resource: Dental & Vision resources,  self-management and patient's adherence to plan as established by provider. ?Discussed plans with patient for ongoing care management follow up and provided patient with direct contact information for care management team ?Evaluation of current treatment plan related to Anxiety & Depression and patient's adherence to plan as established by provider ?Advised patient to  discuss the possibly of use of anti-anxiety medication at next medical office visit ?Reviewed medications with patient and discussed use of antidepressant ?Reviewed scheduled/upcoming provider appointments inclu

## 2021-08-23 NOTE — Patient Outreach (Signed)
?Medicaid Managed Care ?Social Work Note ? ?08/23/2021 ?Name:  Leah Baker MRN:  [id number] [name]:  1980-08-18 ? ?Leah Baker is an 41 y.o. year old female who is a primary patient of Leah Baker, Leah Harry, MD.  The [facility] Managed Care Coordination team was consulted for assistance with:   dental resources ? ?[name] was given information about Medicaid Managed Care Coordination team services today. [name] Patient agreed to services and verbal consent obtained. ? ?Engaged with patient  for by telephone forfollow up visit in response to referral for case management and/or care coordination services.  ? ?Assessments/Interventions:  Review of past medical history, allergies, medications, health status, including review of consultants reports, laboratory and other test data, was performed as part of comprehensive evaluation and provision of chronic care management services. ? ?SDOH: (Social Determinant of Health) assessments and interventions performed: ?BSW completed telephone follow up with patient. She stated she did receive the dental resources BSW sent to her, however she does have bad anxiety when it comes to the dentist and wanted to speak with her PCP about it. Her PCP did complete a referral for the dermatologist and she does have a follow up appointment with her OB for medication adjustments. No other resources are needed at this time. ? ? ?Advanced Directives Status:  Not addressed in this encounter. ? ?Care Plan ?                ?Allergies  ?Allergen Reactions  ? Latex Itching and Rash  ? ? ?Medications Reviewed Today   ? ? Reviewed by Inge Rise, RN (Case Manager) on 08/21/21 at [phone]  Med List Status: <None>  ? ?Medication Order Taking? Sig Documenting Provider Last Dose Status Informant  ?acetaminophen (TYLENOL) 500 MG tablet [id number] No Take 1 tablet (500 mg total) by mouth every 6 (six) hours as needed. Chase Picket, MD Taking Active Self  ?bupivacaine liposome (EXPAREL) 1.3  % injection 266 mg [id number]   Stark Klein, MD  Active   ?Ferrous Sulfate (IRON) 325 (65 Fe) MG TABS [id number] No Take by mouth in the morning and at bedtime. [provider] Taking Active Self  ?         ?Med Note [facility], [name]   Wed Jul 24, 2021  9:37 AM) Taking 3 times a day  ?fluticasone (FLONASE) 50 MCG/ACT nasal spray [id number] No Place 2 sprays into both nostrils daily. Elsie Stain, MD Taking Active   ?         ?Med Note [facility], [name]   Wed Jul 24, 2021  9:37 AM) PRN  ?hydrocortisone (ANUSOL-HC) 2.5 % rectal cream [id number] No Place 1 application rectally 2 (two) times daily. Elsie Stain, MD Taking Active   ?         ?Med Note [facility], [name]   Wed Jul 24, 2021  9:37 AM) PRN  ?Menthol-Camphor (TIGER BALM ARTHRITIS RUB EX) [id number] No Apply 1 application topically daily as needed (knee pain). [provider] Taking Active Self  ?norethindrone (AYGESTIN) 5 MG tablet [id number] No Take 3 tablets (15 mg total) by mouth daily. Elsie Stain, MD Taking Active   ?sertraline (ZOLOFT) 100 MG tablet [id number] No Take 1 tablet (100 mg total) by mouth at bedtime. Elsie Stain, MD Taking Active   ? ?  ?  ? ?  ? ? ?Patient Active Problem List  ? Diagnosis Date Noted  ? Carrier of hemochromatosis HFE gene  mutation 08/07/2020  ? Lymphoid hyperplasia 07/30/2020  ? Anxiety 06/22/2020  ? Menorrhagia 11/27/2018  ? Rectal bleeding 11/26/2018  ? Liver lesion 09/09/2018  ? Hemorrhoid 11/29/2014  ? Iron deficiency anemia 05/04/2014  ? Mood disorder ([place]) 02/20/2014  ? Tears of meniscus and ACL of left knee 09/08/2013  ? Depression 05/14/2011  ? H/O: cesarean section 08/28/2010  ? ? ?Conditions to be addressed/monitored per PCP order:   dental ? ?Care Plan : RN Care Manager Plan of Care  ?Updates made by Ethelda Chick since 08/23/2021 12:00 AM  ?  ? ?Problem: Chronic Disease Management and Care Coordination Needs for Iron Deficiency Anemia, Internal hemorrhoid, Anxiety  & Depression   ?Priority: High  ?  ? ?Long-Range Goal: Development of Plan of Care for Chronic Disease Management and Care Coordination Needs (Iron Deficiency Anemia & Bleeding due to internal hemorroid; Anxiety & Depression)   ?Start Date: 07/24/2021  ?Expected End Date: 11/21/2021  ?Priority: High  ?Note:   ?Current Barriers:  ?Knowledge Deficits related to plan of care for management of Anxiety with Excessive Worry, ?Social Anxiety, and Depression: anxiety ?loss of energy/fatigue ?disturbed sleep ?decreased appetite and Iron Deficiency Anemia & Bleeding due to internal hemorrhoid  ?Care Coordination needs related to Lacks knowledge of community resource: Dental & Vision resources  ?Chronic Disease Management support and education needs related to Anxiety with Excessive Worry, ?Social Anxiety,, Depression: anxiety ?loss of energy/fatigue ?disturbed sleep ?decreased appetite, and Iron Deficiency Anemia  and Bleeding due to internal hemorroid ? ?RNCM Clinical Goal(s):  ?RNCM will help patient verbalize understanding of plan for management of Anxiety, Depression, and Iron Deficiency Anemia & bleeding due to internal hemorrhoid as evidenced by providing education regarding these chronic diseases. ?verbalize basic understanding of Anxiety, Depression, and Iron Deficiency Anemia & bleeding due to internal hemorrhoid disease process and self health management plan as evidenced by improved management of these chronic diseases ?take all medications exactly as prescribed and will call provider for medication related questions as evidenced by medication compliance    ?attend all scheduled medical appointments: 09/09/21 Dr Kennon Rounds - Annual GYN visit; 09/17/21 PCP office visit as evidenced by attending all scheduled appointments        ?demonstrate ongoing adherence to prescribed treatment plan for Anxiety, Depression, and Iron Deficiency Anemia & bleeding due to internal hemorrhoid as evidenced by patient being compliant with  treatment plan. ?continue to work with Consulting civil engineer and/or Social Worker to address care management and care coordination needs related to Anxiety, Depression, and Iron Deficiency Anemia & Bleeding due to internal hemorrhoid  as evidenced by adherence to CM Team Scheduled appointments     ?work with Education officer, museum to address Lacks knowledge of community resource: Dental and Vision resources related to the management of Anxiety, Depression, and Iron Deficiency Anemia & bleeding due to internal hemorrhoid as evidenced by review of EMR and patient or Education officer, museum report     through collaboration with Consulting civil engineer, provider, and care team.  ? ?Interventions: ?Inter-disciplinary care team collaboration (see longitudinal plan of care) ?Evaluation of current treatment plan related to  self management and patient's adherence to plan as established by provider ?08/07/21: BSW completed telephone outreach with patient today. Patient states she is in need of dental and vision resources. Patient states she would also like some assistance pretaining to hair loss. She has been on strong hormones. BSW will send patients PCP and OB a message about a referral or assistance with hair loss.  BSW will email resources to [email or web address]. ?08/23/21: BSW completed telephone follow up with patient. She stated she did receive the dental resources BSW sent to her, however she does have bad anxiety when it comes to the dentist and wanted to speak with her PCP about it. Her PCP did complete a referral for the dermatologist and she does have a follow up appointment with her OB for medication adjustments. No other resources are needed at this time. ? ? ?Anemia/Bleeding:  (Status: Goal on Track (progressing): YES.) Long Term Goal  ?Assessment of understanding of anemia/bleeding disorder diagnosis ?Basic overview and discussion of anemia/bleeding disorder or acute disease state ?Review of most recent labs:  ?Lab Results  ?Component Value Date   ? WBC 6.0 08/20/2021  ? HGB 12.3 08/20/2021  ? HCT 36.5 08/20/2021  ? MCV 86.9 08/20/2021  ? PLT 405 (H) 08/20/2021  ? ?  ?Lab Results  ?Component Value Date  ? IRON 140 08/20/2021  ? TIBC 479 (H) 03/21/

## 2021-08-26 ENCOUNTER — Encounter: Payer: Self-pay | Admitting: Hematology

## 2021-08-26 ENCOUNTER — Telehealth: Payer: Medicaid Other | Admitting: Physician Assistant

## 2021-08-26 DIAGNOSIS — J029 Acute pharyngitis, unspecified: Secondary | ICD-10-CM | POA: Diagnosis not present

## 2021-08-26 MED ORDER — AMOXICILLIN 500 MG PO CAPS: 500.0000 mg | ORAL_CAPSULE | Freq: Three times a day (TID) | ORAL | 0 refills | Status: AC

## 2021-08-26 NOTE — Patient Instructions (Signed)
?  Leah Baker, thank you for joining Rodney Booze, PA-C for today's virtual visit.  While this provider is not your primary care provider (PCP), if your PCP is located in our provider database this encounter information will be shared with them immediately following your visit. ? ?Consent: ?(Patient) Leah Baker provided verbal consent for this virtual visit at the beginning of the encounter. ? ?Current Medications: ? ?Current Outpatient Medications:  ?  acetaminophen (TYLENOL) 500 MG tablet, Take 1 tablet (500 mg total) by mouth every 6 (six) hours as needed., Disp: 30 tablet, Rfl: 0 ?  Ferrous Sulfate (IRON) 325 (65 Fe) MG TABS, Take by mouth in the morning and at bedtime., Disp: , Rfl:  ?  fluticasone (FLONASE) 50 MCG/ACT nasal spray, Place 2 sprays into both nostrils daily., Disp: 16 g, Rfl: 6 ?  hydrocortisone (ANUSOL-HC) 2.5 % rectal cream, Place 1 application rectally 2 (two) times daily., Disp: 30 g, Rfl: 0 ?  Menthol-Camphor (TIGER BALM ARTHRITIS RUB EX), Apply 1 application topically daily as needed (knee pain)., Disp: , Rfl:  ?  norethindrone (AYGESTIN) 5 MG tablet, Take 3 tablets (15 mg total) by mouth daily., Disp: 90 tablet, Rfl: 3 ?  sertraline (ZOLOFT) 100 MG tablet, Take 1 tablet (100 mg total) by mouth at bedtime., Disp: 60 tablet, Rfl: 3 ?No current facility-administered medications for this visit. ? ?Facility-Administered Medications Ordered in Other Visits:  ?  bupivacaine liposome (EXPAREL) 1.3 % injection 266 mg, 20 mL, Infiltration, Once, Stark Klein, MD  ? ?Medications ordered in this encounter:  ?No orders of the defined types were placed in this encounter. ?  ? ?*If you need refills on other medications prior to your next appointment, please contact your pharmacy* ? ?Follow-Up: ?Call back or seek an in-person evaluation if the symptoms worsen or if the condition fails to improve as anticipated. ? ?Other Instructions ?You were given a prescription for antibiotics. Please take  the antibiotic prescription fully.  ? ?Follow up with your regular doctor in 1 week for reassessment and seek care sooner if your symptoms worsen or fail to improve. ? ? ? ?If you have been instructed to have an in-person evaluation today at a local Urgent Care facility, please use the link below. It will take you to a list of all of our available [facility], including address, phone number and hours of operation. Please do not delay care.  ?[facility] ? ?If you or a family member do not have a primary care provider, use the link below to schedule a visit and establish care. When you choose a [facility] primary care physician or advanced practice provider, you gain a long-term partner in health. ?Find a Primary Care Provider ? ?Learn more about [facility]'s in-office and virtual care options: ?[facility] Now ? ?

## 2021-08-26 NOTE — Progress Notes (Signed)
[name], Leah Baker are scheduled for a virtual visit with your provider today.   ? ?Just as we do with appointments in the office, we must obtain your consent to participate.  Your consent will be active for this visit and any virtual visit you may have with one of our providers in the next 365 days.   ? ?If you have a MyChart account, I can also send a copy of this consent to you electronically.  All virtual visits are billed to your insurance company just like a traditional visit in the office.  As this is a virtual visit, video technology does not allow for your provider to perform a traditional examination.  This may limit your provider's ability to fully assess your condition.  If your provider identifies any concerns that need to be evaluated in person or the need to arrange testing such as labs, EKG, etc, we will make arrangements to do so.   ? ?Although advances in technology are sophisticated, we cannot ensure that it will always work on either your end or our end.  If the connection with a video visit is poor, we may have to switch to a telephone visit.  With either a video or telephone visit, we are not always able to ensure that we have a secure connection.   I need to obtain your verbal consent now.   Are you willing to proceed with your visit today?  ? ?Leah Baker has provided verbal consent on 08/26/2021 for a virtual visit (video or telephone). ? ? ?Prakash Kimberling Gilman Schmidt, PA-C ?08/26/2021  7:39 AM ? ? ?Date:  08/26/2021  ? ?ID:  Leah Baker, [name] 1980-10-23, MRN [id number] ? ?Patient Location: Home ?Provider Location: Home Office ? ? ?Participants: Patient and Provider for Visit and Wrap up ? ?Method of visit: Video  ?Location of Patient: Home ?Location of Provider: Home Office ?Consent was obtain for visit over the video. ?Services rendered by provider: Visit was performed via video ? ?A video enabled telemedicine application was used and I verified that I am speaking with the correct person using two  identifiers. ? ?PCP:  Elsie Stain, MD  ? ?Chief Complaint:  sore throat ? ?History of Present Illness:   ? ?Leah Baker is a 41 y.o. female with history as stated below. ?Presents video telehealth for an acute care visit ? ?Pt has had a sore throat for the last 3 days. She further reports fevers, chills.  ? ?She denies cough. She states that her daughter was recently sick with similar symptoms. She has had some congestion and rhinorrhea as well. ? ?Past Medical, Surgical, Social History, Allergies, and Medications have been Reviewed. ? ?Past Medical History:  ?Diagnosis Date  ? Abnormal uterine bleeding (AUB)   ? Anemia   ? Phreesia 06/08/2020  ? Anxiety   ? Blood in stool last 2 years  ? Depression   ? Depression   ? [employer]reesia 06/08/2020  ? Hemorrhoids   ? History of blood transfusion 05/2019 2 units  ? iron transfusion given also  ? History of lower GI bleeding dec 2021 active problem  ? s/p  colonoscopy 09-01-2018,  hemorrhoids  ? IDA (iron deficiency anemia)   ? Inguinal lymphadenopathy   ? CT 09-14-2018  right side, followed by pcp  ? PONV (postoperative nausea and vomiting)   ? only happened once  ? Wears glasses   ? Wears glasses   ? ? ?Current Meds  ?Medication Sig  ? amoxicillin (  AMOXIL) 500 MG capsule Take 1 capsule (500 mg total) by mouth 3 (three) times daily for 10 days.  ?  ? ?Allergies:   Latex  ? ?ROS ?See HPI for history of present illness. ? ?Physical Exam ?Constitutional:   ?   Appearance: Normal appearance.  ?HENT:  ?   Mouth/Throat:  ?   Comments: Exam is limited but I am not able to appreciate any unilateral tonsillar edema. Pt has normal phonation and is tolerating secretions ?Neurological:  ?   Mental Status: She is alert.  ? ?MDM: Dx pharyngitis. With fevers at home I recommended abx as I cannot r/o strep throat and she states daughter was not tested for this. Advised supportive care as well with tylenol, salt water gargles, and otc throat stray and lozenges.  ? ?          ? ?There  are no diagnoses linked to this encounter. ? ? ?Time:   ?Today, I have spent 10 minutes with the patient with telehealth technology discussing the above problems, reviewing the chart, previous notes, medications and orders.  ? ? ?Tests Ordered: ?No orders of the defined types were placed in this encounter. ? ? ?Medication Changes: ?Meds ordered this encounter  ?Medications  ? amoxicillin (AMOXIL) 500 MG capsule  ?  Sig: Take 1 capsule (500 mg total) by mouth 3 (three) times daily for 10 days.  ?  Dispense:  30 capsule  ?  Refill:  0  ?  Order Specific Question:   Supervising Provider  ?  Answer:   Noemi Chapel [[id number]]  ? ? ? ?Disposition:  Follow up  ?Signed, ?Nelva Hauk Gilman Schmidt, PA-C  ?08/26/2021 7:39 AM    ? ? ?

## 2021-09-02 ENCOUNTER — Ambulatory Visit: Payer: Medicaid Other | Admitting: Critical Care Medicine

## 2021-09-09 ENCOUNTER — Ambulatory Visit: Payer: Medicaid Other | Admitting: Family Medicine

## 2021-09-17 ENCOUNTER — Ambulatory Visit: Payer: Medicaid Other | Attending: Critical Care Medicine | Admitting: Critical Care Medicine

## 2021-09-17 ENCOUNTER — Encounter: Payer: Self-pay | Admitting: Critical Care Medicine

## 2021-09-17 VITALS — BP 132/93 | HR 101 | Wt 166.2 lb

## 2021-09-17 DIAGNOSIS — F329 Major depressive disorder, single episode, unspecified: Secondary | ICD-10-CM

## 2021-09-17 DIAGNOSIS — K625 Hemorrhage of anus and rectum: Secondary | ICD-10-CM | POA: Diagnosis not present

## 2021-09-17 DIAGNOSIS — Z148 Genetic carrier of other disease: Secondary | ICD-10-CM

## 2021-09-17 DIAGNOSIS — F411 Generalized anxiety disorder: Secondary | ICD-10-CM

## 2021-09-17 DIAGNOSIS — L659 Nonscarring hair loss, unspecified: Secondary | ICD-10-CM

## 2021-09-17 DIAGNOSIS — R030 Elevated blood-pressure reading, without diagnosis of hypertension: Secondary | ICD-10-CM | POA: Insufficient documentation

## 2021-09-17 DIAGNOSIS — F430 Acute stress reaction: Secondary | ICD-10-CM

## 2021-09-17 DIAGNOSIS — N921 Excessive and frequent menstruation with irregular cycle: Secondary | ICD-10-CM | POA: Diagnosis not present

## 2021-09-17 DIAGNOSIS — K029 Dental caries, unspecified: Secondary | ICD-10-CM | POA: Insufficient documentation

## 2021-09-17 DIAGNOSIS — K649 Unspecified hemorrhoids: Secondary | ICD-10-CM

## 2021-09-17 DIAGNOSIS — F39 Unspecified mood [affective] disorder: Secondary | ICD-10-CM

## 2021-09-17 DIAGNOSIS — R599 Enlarged lymph nodes, unspecified: Secondary | ICD-10-CM | POA: Diagnosis not present

## 2021-09-17 MED ORDER — HYDROCORTISONE (PERIANAL) 2.5 % EX CREA: 1.0000 "application " | TOPICAL_CREAM | Freq: Two times a day (BID) | CUTANEOUS | 2 refills | Status: DC

## 2021-09-17 MED ORDER — FLUTICASONE PROPIONATE 50 MCG/ACT NA SUSP: 2.0000 | Freq: Every day | NASAL | 6 refills | Status: AC

## 2021-09-17 MED ORDER — SERTRALINE HCL 100 MG PO TABS: 100.0000 mg | ORAL_TABLET | Freq: Every day | ORAL | 3 refills | Status: DC

## 2021-09-17 NOTE — Assessment & Plan Note (Signed)
Internal hemorrhoids with continued bleeding ? ?Referral back to general surgery for consideration of a banding procedure ?

## 2021-09-17 NOTE — Progress Notes (Addendum)
? ? ?Established Patient Office Visit ? ?Subjective:  ?Patient ID: Leah Baker, female    [name]: 1980/09/30  Age: 41 y.o. MRN: [id number] ? ?CC:  ?PCP f/u ; ALOPECIA ? ?HPI ?12/2 ?Leah Baker presents for primary care follow-up.  Note patient was given a course of Augmentin for left ear pain by telephone visit a week ago.  Today she states her symptoms are improved.  She still has decreased hearing in the left ear but her ear is popping open when she applies positive pressure.  On arrival blood pressures 130/89. ? ?Patient has had follow-up with hematology and they did not feel her adenopathy is from malignancy.  Patient is on iron infusions for iron deficiency anemia due to menorrhagia and rectal bleeding from internal hemorrhoids.  Patient would like a second opinion referral for her hemorrhoids. ? ?Patient has no other complaints ? ?09/17/2021 ?Patient returns in follow-up and complains of progressive alopecia while being on progesterone therapy for her menorrhagia.  She had an ablation procedure that did not successfully reduce the bleeding.  She has follow-up appointment with gynecology in June.  Another issue she continues to have rectal bleeding and has no real follow-up with surgery at this time.  She saw hematology below is documentation from the hematologic visit.  She is not iron deficient at this time she is taking iron supplements and her hemoglobin was 12.  She still having rectal bleeding and is using Anusol rectal cream.  She has more internal hemorrhoids and may need a banding procedure.  General surgery had not wish to intervene until the lymph node was further assessed.  Lymph node biopsy has already been performed and showed lymphoid hyperplasia no malignancy. ? ?As per last hematology assessment as below in March: ?1) Rt inguiinal and iliac lymphadenopathy -- reactive lymphoid hyperplasia ?2) Patient reported possible Hemochromatosis ?carrier state ?3) Iron deficiency anemia likely from  hemorrhoidal bleeding ?4) Thrombocytosis - likely reactive from iron deficiency. ?PLAN: ?-Discussed patient's labwork today, 08/20/2021  ?CBC shows normal hemoglobin of 12.3 with normal WBC count and platelets of 405k ?CMP unremarkable ?Ferritin of 173 with an iron saturation of 29%. ?-No indication for additional IV iron replacement at this time. ?-Continue p.o. iron polysaccharide 150 mg p.o. daily ?-Follow-up with OB/GYN for continued management of menorrhagia.  Patient is being considered for endometrial ablation and is currently on progesterone. ?-Continue follow-up with gastroenterology Dr. Alessandra Bevels for management of GI bleeding/possible consideration of banding of hemorrhoids. ?  ? ?Patient does complain of some tooth pain.  Also having stress and anxiety does have follow-up plans for her mental health provider.  She is on the sertraline 100 mg daily. ?Past Medical History:  ?Diagnosis Date  ? Abnormal uterine bleeding (AUB)   ? Anemia   ? Phreesia 06/08/2020  ? Anxiety   ? Blood in stool last 2 years  ? Depression   ? Depression   ? [employer]reesia 06/08/2020  ? Hemorrhoids   ? History of blood transfusion 05/2019 2 units  ? iron transfusion given also  ? History of lower GI bleeding dec 2021 active problem  ? s/p  colonoscopy 09-01-2018,  hemorrhoids  ? IDA (iron deficiency anemia)   ? Inguinal lymphadenopathy   ? CT 09-14-2018  right side, followed by pcp  ? PONV (postoperative nausea and vomiting)   ? only happened once  ? Wears glasses   ? Wears glasses   ? ? ?Past Surgical History:  ?Procedure Laterality Date  ? BIOPSY  09/01/2018  ? Procedure: BIOPSY;  Surgeon: Otis Brace, MD;  Location: [facility];  Service: Gastroenterology;;  ? CESAREAN SECTION  01/15/2002   '@[facility]'$   ? CESAREAN SECTION  03/17/2011  ? Procedure: CESAREAN SECTION;  Surgeon: Osborne Oman, MD;  Location: [facility];  Service: Gynecology;  Laterality: N/A;  ? COLONOSCOPY WITH PROPOFOL N/A 09/01/2018  ? Procedure: COLONOSCOPY WITH PROPOFOL;   Surgeon: Otis Brace, MD;  Location: [facility];  Service: Gastroenterology;  Laterality: N/A;  ? DILITATION & CURRETTAGE/HYSTROSCOPY WITH HYDROTHERMAL ABLATION N/A 08/22/2020  ? Procedure: DILATATION & CURETTAGE/HYSTEROSCOPY CERVICAL REPAIR;  Surgeon: [name], MD;  Location: [facility];  Service: Gynecology;  Laterality: N/A;  ? HYSTEROSCOPY WITH D & C  06-21-2009  dr constant '@[facility]   ? w/  REMOVAL IUD   ? INGUINAL LYMPH NODE BIOPSY Right 04/12/2020  ? Procedure: RIGHT INGUINAL LYMPH NODE EXCISIONAL BIOPSY;  Surgeon: Stark Klein, MD;  Location: [facility];  Service: General;  Laterality: Right;  ? IUD REMOVAL  06/21/2009  ? KNEE ARTHROSCOPY WITH ANTERIOR CRUCIATE LIGAMENT (ACL) REPAIR Left 09-08-2013   dr [name]  '@[facility]'$   ? w/  meniscal repair  ? MULTIPLE TOOTH EXTRACTIONS  2012  ? RECTAL EXAM UNDER ANESTHESIA N/A 11/18/2018  ? Procedure: ANORECTAL EXAM UNDER ANESTHESIA;  Surgeon: Ileana Roup, MD;  Location: [facility];  Service: General;  Laterality: N/A;  ? TUBAL LIGATION Bilateral 2012  ? ? ?Family History  ?Problem Relation Age of Onset  ? Cancer Mother   ? Kidney failure Mother   ? Healthy Father   ? ? ?Social History  ? ?Socioeconomic History  ? Marital status: Significant Other  ?  Spouse name: Not on file  ? Number of children: Not on file  ? Years of education: Not on file  ? Highest education level: Not on file  ?Occupational History  ? Not on file  ?Tobacco Use  ? Smoking status: Former  ?  Years: 5.00  ?  Types: Cigarettes  ?  Quit date: 08/22/2008  ?  Years since quitting: 13.0  ? Smokeless tobacco: Never  ?Vaping Use  ? Vaping Use: Never used  ?Substance and Sexual Activity  ? Alcohol use: Not Currently  ? Drug use: Not Currently  ?  Comment: per pt younger in college marijuana, none since  ? Sexual activity: Yes  ?  Birth control/protection: Surgical  ?Other Topics Concern  ? Not on file  ?Social History Narrative  ? Not on file  ? ?Social Determinants of  Health  ? ?Financial Resource Strain: Low Risk   ? Difficulty of Paying Living Expenses: Not hard at all  ?Food Insecurity: No Food Insecurity  ? Worried About Charity fundraiser in the Last Year: Never true  ? Ran Out of Food in the Last Year: Never true  ?Transportation Needs: No Transportation Needs  ? Lack of Transportation (Medical): No  ? Lack of Transportation (Non-Medical): No  ?Physical Activity: Insufficiently Active  ? Days of Exercise per Week: 2 days  ? Minutes of Exercise per Session: 20 min  ?Stress: Stress Concern Present  ? Feeling of Stress : Very much  ?Social Connections: Moderately Integrated  ? Frequency of Communication with Friends and Family: More than three times a week  ? Frequency of Social Gatherings with Friends and Family: More than three times a week  ? Attends Religious Services: More than 4 times per year  ? Active Member of Clubs or Organizations:  No  ? Attends Archivist Meetings: Never  ? Marital Status: Living with partner  ?Intimate Partner Violence: Not on file  ? ? ?Outpatient Medications Prior to Visit  ?Medication Sig Dispense Refill  ? acetaminophen (TYLENOL) 500 MG tablet Take 1 tablet (500 mg total) by mouth every 6 (six) hours as needed. 30 tablet 0  ? Ferrous Sulfate (IRON) 325 (65 Fe) MG TABS Take by mouth in the morning and at bedtime.    ? Menthol-Camphor (TIGER BALM ARTHRITIS RUB EX) Apply 1 application topically daily as needed (knee pain).    ? norethindrone (AYGESTIN) 5 MG tablet Take 3 tablets (15 mg total) by mouth daily. 90 tablet 3  ? fluticasone (FLONASE) 50 MCG/ACT nasal spray Place 2 sprays into both nostrils daily. 16 g 6  ? hydrocortisone (ANUSOL-HC) 2.5 % rectal cream Place 1 application rectally 2 (two) times daily. 30 g 0  ? sertraline (ZOLOFT) 100 MG tablet Take 1 tablet (100 mg total) by mouth at bedtime. 60 tablet 3  ? ?Facility-Administered Medications Prior to Visit  ?Medication Dose Route Frequency Provider Last Rate Last Admin  ?  bupivacaine liposome (EXPAREL) 1.3 % injection 266 mg  20 mL Infiltration Once Stark Klein, MD      ? ? ?Allergies  ?Allergen Reactions  ? Latex Itching and Rash  ? ? ?ROS ?Review of Systems  ?Constitutiona

## 2021-09-17 NOTE — Assessment & Plan Note (Signed)
Likely related to progesterone use ?

## 2021-09-17 NOTE — Assessment & Plan Note (Signed)
Iron levels now normal hemoglobin 12 she is on iron supplement orally no IV iron indicated ?

## 2021-09-17 NOTE — Assessment & Plan Note (Signed)
Patient has very mild diastolic hypertension and systolic numbers are normal ? ?Patient was given lifestyle medicine handout asked to follow-up this lifestyle approach no medications indicated as of yet ?

## 2021-09-17 NOTE — Assessment & Plan Note (Signed)
As per hemorrhoid assessment ?

## 2021-09-17 NOTE — Assessment & Plan Note (Signed)
Indicated patient needs to have the teeth removed that her show severe dental caries with periodontal disease gave patient list of Medicaid dentist to choose from ?

## 2021-09-17 NOTE — Patient Instructions (Signed)
Referral back to [facility] surgery for hemorrhoids we made ? ?Please refer to the dental resource sheet make an appointment for a dentist to have some of the severely carious teeth removed ? ?Refills on all medications were sent to your [place] ? ?Please call your mental health therapist for an appointment ? ?Please send a MyChart message Dr. Kennon Rounds inquiring about neck steps on progesterone dosing ? ?Return to see Dr. [name] 4 months ? ?Review information on the lifestyle medicine handout we gave you ?

## 2021-09-17 NOTE — Assessment & Plan Note (Signed)
As per mood disorder continue Zoloft ?

## 2021-09-17 NOTE — Assessment & Plan Note (Signed)
Has genetic carrier state for hemochromatosis but no overt evidence of hemochromatosis at this time ?

## 2021-09-17 NOTE — Assessment & Plan Note (Signed)
No evidence of malignancy will observe ?

## 2021-09-17 NOTE — Assessment & Plan Note (Signed)
Continue Zoloft at current dose level and asked patient to keep follow-up with mental health ?

## 2021-09-17 NOTE — Assessment & Plan Note (Signed)
Asked patient to call [facility] to see if dosing of progesterone needs to be adjusted and asked her to keep her follow-up appointments ?

## 2021-09-17 NOTE — Assessment & Plan Note (Signed)
As per hereditary hemochromatosis ?

## 2021-09-18 ENCOUNTER — Other Ambulatory Visit: Payer: Self-pay

## 2021-09-18 NOTE — Patient Instructions (Signed)
Visit Information ? ?Leah Baker was given information about Medicaid Managed Care team care coordination services as a part of their [place] Medicaid benefit. Leah Baker verbally consented to engagement with the [facility] Managed Care team.  ? ?If you are experiencing a medical emergency, please call 911 or report to your local emergency department or urgent care.  ? ?If you have a non-emergency medical problem during routine business hours, please contact your provider's office and ask to speak with a nurse.  ? ?For questions related to your [facility], please call: [phone] or visit the homepage here: [email or web address] ? ?If you would like to schedule transportation through your [facility], please call the following number at least 2 days in advance of your appointment: [phone]. ? Rides for urgent appointments can also be made after hours by calling Member Services. ? ?Call the [place] at [phone], at any time, 24 hours a day, 7 days a week. If you are in danger or need immediate medical attention call 911. ? ?If you would like help to quit smoking, call 1-800-QUIT-NOW (346)395-2801) OR Espa?ol: 1-855-D?jelo-Ya 3677128877) o para m?s informaci?n haga clic aqu? or Text READY to 200-400 to register via text ? ?[name] - following are the goals we discussed in your visit today:  Please see Patient's Goals in the [place] of Care below. ? ?Please see education materials related to today's visit provided by MyChart link. ? ?Patient verbalizes understanding of instructions and care plan provided today and agrees to view in [place]. Active MyChart status confirmed with patient.   ? ?The Managed Medicaid care management team will reach out to the patient again over the next 30 days.  ? ?Leah Marvel RN, BSN ?Community Care  Coordinator ?[facility] ?Mobile: [phone]  ? ?Following is a copy of your plan of care:  ?Care Plan : [place] of Care  ?Updates made by Inge Rise, RN since 09/18/2021 12:00 AM  ?  ? ?Problem: Chronic Disease Management and Care Coordination Needs for Iron Deficiency Anemia, Internal hemorrhoid, Anxiety & Depression   ?Priority: High  ?  ? ?Long-Range Goal: Development of Plan of Care for Chronic Disease Management and Care Coordination Needs (Iron Deficiency Anemia & Bleeding due to internal hemorroid; Anxiety & Depression)   ?Start Date: 07/24/2021  ?Expected End Date: 11/21/2021  ?Priority: High  ?Note:   ?Current Barriers:  ?Knowledge Deficits related to plan of care for management of Anxiety with Excessive Worry, ?Social Anxiety, and Depression: anxiety ?loss of energy/fatigue ?disturbed sleep ?decreased appetite and Iron Deficiency Anemia & Bleeding due to internal hemorrhoid  ?Care Coordination needs related to Lacks knowledge of community resource: Dental & Vision resources  ?Chronic Disease Management support and education needs related to Anxiety with Excessive Worry, ?Social Anxiety,, Depression: anxiety ?loss of energy/fatigue ?disturbed sleep ?decreased appetite, and Iron Deficiency Anemia  and Bleeding due to internal hemorroid ? ?RNCM Clinical Goal(s):  ?RNCM will help patient verbalize understanding of plan for management of Anxiety, Depression, and Iron Deficiency Anemia & bleeding due to internal hemorrhoid as evidenced by providing education regarding these chronic diseases. ?verbalize basic understanding of Anxiety, Depression, and Iron Deficiency Anemia & bleeding due to internal hemorrhoid disease process and self health management plan as evidenced by improved management of these chronic diseases ?take all medications exactly as prescribed and will call provider for medication related questions  as evidenced by medication compliance    ?attend  all scheduled medical appointments: 11/21/21 Dr Kennon Rounds - Annual GYN visit; as evidenced by attending all scheduled appointments        ?demonstrate ongoing adherence to prescribed treatment plan for Anxiety, Depression, and Iron Deficiency Anemia & bleeding due to internal hemorrhoid as evidenced by patient being compliant with treatment plan. ?continue to work with Consulting civil engineer and/or Social Worker to address care management and care coordination needs related to Anxiety, Depression, and Iron Deficiency Anemia & Bleeding due to internal hemorrhoid  as evidenced by adherence to CM Team Scheduled appointments     ?work with Education officer, museum to address Lacks knowledge of community resource: Dental and Vision resources related to the management of Anxiety, Depression, and Iron Deficiency Anemia & bleeding due to internal hemorrhoid as evidenced by review of EMR and patient or Education officer, museum report     through collaboration with Consulting civil engineer, provider, and care team.  ? ?Interventions: ?Inter-disciplinary care team collaboration (see longitudinal plan of care) ?Evaluation of current treatment plan related to  self management and patient's adherence to plan as established by provider ?08/07/21: BSW completed telephone outreach with patient today. Patient states she is in need of dental and vision resources. Patient states she would also like some assistance pretaining to hair loss. She has been on strong hormones. BSW will send patients PCP and OB a message about a referral or assistance with hair loss. BSW will email resources to [email or web address]. ?08/23/21: BSW completed telephone follow up with patient. She stated she did receive the dental resources BSW sent to her, however she does have bad anxiety when it comes to the dentist and wanted to speak with her PCP about it. Her PCP did complete a referral for the dermatologist and she does have a follow up appointment with her OB for medication adjustments. No other  resources are needed at this time. ? ? ?Anemia/Bleeding:  (Status: Goal on track: NO.) Long Term Goal  ?Assessment of understanding of anemia/bleeding disorder diagnosis ?Basic overview and discussion of anemia/bleeding disorder or acute disease state ?Review of most recent labs:  ?Lab Results  ?Component Value Date  ? WBC 6.0 08/20/2021  ? HGB 12.3 08/20/2021  ? HCT 36.5 08/20/2021  ? MCV 86.9 08/20/2021  ? PLT 405 (H) 08/20/2021  ? ?  ?Lab Results  ?Component Value Date  ? IRON 140 08/20/2021  ? TIBC 479 (H) 08/20/2021  ? FERRITIN 173 08/20/2021  ?   ?Medications reviewed ?- Counseled on importance of regular laboratory monitoring as directed by provider; ?- recommended promotion of rest and energy-conserving measures to manage fatigue, such as balancing activity with periods of rest ?- encouraged dietary changes to increase dietary intake of iron, Vitamin P10 and folic acid as advised/prescribed ?- Screening for signs and symptoms of depression related to chronic disease state;  ?- Assessed social determinant of health barriers;  ?Patient reports her energy level is "up & down".  Patient is active with her 56 year old daughter and goes outside (wears a mask due to increased pollen) for activities.  Patient's eating habits continue to focus on increased iron intake through food items such as green leafy vegetables including kale & spinach.  Patient continues to eat 2 vegetables, a meat and eliminating carbohydrate/starch food item at mealtime.  This RN Care Manager encouraged patient to not over due her physical activity but to listen to her body when she needs to rest and take  a break. I continue to provide encouragement for the changes she is making to improve her overall health and how she is her best advocate for her own health with health decisions in the future. ?Patient reports occasional break-thru vaginal bleeding which she describes as moderate amount of blood which "lasts for a couple of days then goes  away".  Patient has been taking Aygestin incorrectly - taking 1 tablet TID versus 3 tablets once a day.  Patient read the prescription bottle label aloud to this RN Care Manager and I clarified that the order

## 2021-09-18 NOTE — Patient Outreach (Signed)
?Medicaid Managed Care   ?Nurse Care Manager Note ? ?09/18/2021 ?Name:  Leah Baker MRN:  [id number] [name]:  1980-07-15 ? ?Leah Baker is an 41 y.o. year old female who is a primary patient of [name], Burnett Harry, MD.  The [facility] Managed Care Coordination team was consulted for assistance with:    ?Anxiety ?Depression ?Iron Deficiency Anemia ? ?Leah Baker was given information about Medicaid Managed Care Coordination team services today. Leah Baker Patient agreed to services and verbal consent obtained. ? ?Engaged with patient by telephone for follow up visit in response to provider referral for case management and/or care coordination services.  ? ?Assessments/Interventions:  Review of past medical history, allergies, medications, health status, including review of consultants reports, laboratory and other test data, was performed as part of comprehensive evaluation and provision of chronic care management services. ? ?SDOH (Social Determinants of Health) assessments and interventions performed: ? ? ?Care Plan ? ?Allergies  ?Allergen Reactions  ? Latex Itching and Rash  ? ? ?Medications Reviewed Today   ? ? Reviewed by Inge Rise, RN (Case Manager) on 09/18/21 at 1101  Med List Status: <None>  ? ?Medication Order Taking? Sig Documenting Provider Last Dose Status Informant  ?acetaminophen (TYLENOL) 500 MG tablet [id number] No Take 1 tablet (500 mg total) by mouth every 6 (six) hours as needed. Chase Picket, MD Taking Active Self  ?bupivacaine liposome (EXPAREL) 1.3 % injection 266 mg [id number]   Stark Klein, MD  Active   ?Ferrous Sulfate (IRON) 325 (65 Fe) MG TABS [id number] No Take by mouth in the morning and at bedtime. [provider] Taking Active Self  ?         ?Med Note [facility], Devaughn Savant A   Wed Jul 24, 2021  9:37 AM) Taking 3 times a day  ?fluticasone (FLONASE) 50 MCG/ACT nasal spray [id number]  Place 2 sprays into both nostrils daily. Elsie Stain, MD  Active    ?hydrocortisone (ANUSOL-HC) 2.5 % rectal cream [id number]  Place 1 application. rectally 2 (two) times daily. Elsie Stain, MD  Active   ?Menthol-Camphor (TIGER BALM ARTHRITIS RUB EX) [id number] No Apply 1 application topically daily as needed (knee pain). [provider] Taking Active Self  ?norethindrone (AYGESTIN) 5 MG tablet [id number] No Take 3 tablets (15 mg total) by mouth daily. Elsie Stain, MD Taking Active   ?sertraline (ZOLOFT) 100 MG tablet [id number]  Take 1 tablet (100 mg total) by mouth at bedtime. Elsie Stain, MD  Active   ? ?  ?  ? ?  ? ? ?Patient Active Problem List  ? Diagnosis Date Noted  ? Hereditary hemochromatosis ([place]) 09/17/2021  ? Alopecia 09/17/2021  ? Elevated blood pressure reading 09/17/2021  ? Dental caries 09/17/2021  ? 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 Lymphoid hyperplasia 07/30/2020  ? Menorrhagia 11/27/2018  ? Rectal bleeding 11/26/2018  ? Liver lesion 09/09/2018  ? Hemorrhoid 11/29/2014  ? Iron deficiency anemia 05/04/2014  ? Mood disorder ([place]) 02/20/2014  ? Tears of meniscus and ACL of left knee 09/08/2013  ? Depression 05/14/2011  ? H/O: cesarean section 08/28/2010  ? ? ?Conditions to be addressed/monitored per PCP order:  Anxiety, Depression, and Iron Deficiency Anemia ? ?Care Plan : RN Care Manager Plan of Care  ?Updates made by Inge Rise, RN since 09/18/2021 12:00 AM  ?  ? ?Problem: Chronic Disease Management and Care Coordination Needs for Iron Deficiency Anemia, Internal hemorrhoid, Anxiety &  Depression   ?Priority: High  ?  ? ?Long-Range Goal: Development of Plan of Care for Chronic Disease Management and Care Coordination Needs (Iron Deficiency Anemia & Bleeding due to internal hemorroid; Anxiety & Depression)   ?Start Date: 07/24/2021  ?Expected End Date: 11/21/2021  ?Priority: High  ?Note:   ?Current Barriers:  ?Knowledge Deficits related to plan of care for management of Anxiety with Excessive Worry, ?Social  Anxiety, and Depression: anxiety ?loss of energy/fatigue ?disturbed sleep ?decreased appetite and Iron Deficiency Anemia & Bleeding due to internal hemorrhoid  ?Care Coordination needs related to Lacks knowledge of community resource: Dental & Vision resources  ?Chronic Disease Management support and education needs related to Anxiety with Excessive Worry, ?Social Anxiety,, Depression: anxiety ?loss of energy/fatigue ?disturbed sleep ?decreased appetite, and Iron Deficiency Anemia  and Bleeding due to internal hemorroid ? ?RNCM Clinical Goal(s):  ?RNCM will help patient verbalize understanding of plan for management of Anxiety, Depression, and Iron Deficiency Anemia & bleeding due to internal hemorrhoid as evidenced by providing education regarding these chronic diseases. ?verbalize basic understanding of Anxiety, Depression, and Iron Deficiency Anemia & bleeding due to internal hemorrhoid disease process and self health management plan as evidenced by improved management of these chronic diseases ?take all medications exactly as prescribed and will call provider for medication related questions as evidenced by medication compliance    ?attend all scheduled medical appointments: 11/21/21 Dr Kennon Rounds - Annual GYN visit; as evidenced by attending all scheduled appointments        ?demonstrate ongoing adherence to prescribed treatment plan for Anxiety, Depression, and Iron Deficiency Anemia & bleeding due to internal hemorrhoid as evidenced by patient being compliant with treatment plan. ?continue to work with Consulting civil engineer and/or Social Worker to address care management and care coordination needs related to Anxiety, Depression, and Iron Deficiency Anemia & Bleeding due to internal hemorrhoid  as evidenced by adherence to CM Team Scheduled appointments     ?work with Education officer, museum to address Lacks knowledge of community resource: Dental and Vision resources related to the management of Anxiety, Depression, and Iron  Deficiency Anemia & bleeding due to internal hemorrhoid as evidenced by review of EMR and patient or Education officer, museum report     through collaboration with Consulting civil engineer, provider, and care team.  ? ?Interventions: ?Inter-disciplinary care team collaboration (see longitudinal plan of care) ?Evaluation of current treatment plan related to  self management and patient's adherence to plan as established by provider ?08/07/21: BSW completed telephone outreach with patient today. Patient states she is in need of dental and vision resources. Patient states she would also like some assistance pretaining to hair loss. She has been on strong hormones. BSW will send patients PCP and OB a message about a referral or assistance with hair loss. BSW will email resources to [email or web address]. ?08/23/21: BSW completed telephone follow up with patient. She stated she did receive the dental resources BSW sent to her, however she does have bad anxiety when it comes to the dentist and wanted to speak with her PCP about it. Her PCP did complete a referral for the dermatologist and she does have a follow up appointment with her OB for medication adjustments. No other resources are needed at this time. ? ? ?Anemia/Bleeding:  (Status: Goal on track: NO.) Long Term Goal  ?Assessment of understanding of anemia/bleeding disorder diagnosis ?Basic overview and discussion of anemia/bleeding disorder or acute disease state ?Review of most recent labs:  ?Lab Results  ?Component Value  Date  ? WBC 6.0 08/20/2021  ? HGB 12.3 08/20/2021  ? HCT 36.5 08/20/2021  ? MCV 86.9 08/20/2021  ? PLT 405 (H) 08/20/2021  ? ?  ?Lab Results  ?Component Value Date  ? IRON 140 08/20/2021  ? TIBC 479 (H) 08/20/2021  ? FERRITIN 173 08/20/2021  ?   ?Medications reviewed ?- Counseled on importance of regular laboratory monitoring as directed by provider; ?- recommended promotion of rest and energy-conserving measures to manage fatigue, such as balancing activity with periods  of rest ?- encouraged dietary changes to increase dietary intake of iron, Vitamin Y54 and folic acid as advised/prescribed ?- Screening for signs and symptoms of depression related to chronic disease state;  ?- [place]

## 2021-09-26 ENCOUNTER — Encounter (HOSPITAL_BASED_OUTPATIENT_CLINIC_OR_DEPARTMENT_OTHER): Payer: Medicaid Other | Admitting: Critical Care Medicine

## 2021-09-26 DIAGNOSIS — T887XXA Unspecified adverse effect of drug or medicament, initial encounter: Secondary | ICD-10-CM | POA: Diagnosis not present

## 2021-09-26 DIAGNOSIS — R300 Dysuria: Secondary | ICD-10-CM

## 2021-09-30 ENCOUNTER — Other Ambulatory Visit: Payer: Self-pay

## 2021-09-30 NOTE — Patient Outreach (Signed)
?Medicaid Managed Care ?Social Work Note ? ?09/30/2021 ?Name:  Leah Baker MRN:  [id number] [name]:  01-Apr-1981 ? ?Leah Baker is an 41 y.o. year old female who is Baker primary patient of Leah Gaskins Burnett Harry, MD.  The Medicaid Managed Care Coordination team was consulted for assistance with:  Community Resources  ? ?Leah Baker was given information about Medicaid Managed Care Coordination team services today. Leah Baker Patient agreed to services and verbal consent obtained. ? ?Engaged with patient  for by telephone forfollow up visit in response to referral for case management and/or care coordination services.  ? ?Assessments/Interventions:  Review of past medical history, allergies, medications, health status, including review of consultants reports, laboratory and other test data, was performed as part of comprehensive evaluation and provision of chronic care management services. ? ?SDOH: (Social Determinant of Health) assessments and interventions performed: ?BSW completed telephone outreach with patient. She stated she did speak with her PCP about going to the dentist due to her having Baker traumatic experience when she was younger she does not like to go know. Patient stated she did have some anxiety around her surgery and going to Baker new group, but after speaking with her PCP she feels better about it. No other resources are needed at this time ? ?Advanced Directives Status:  Not addressed in this encounter. ? ?Care Plan ?                ?Allergies  ?Allergen Reactions  ? Latex Itching and Rash  ? ? ?Medications Reviewed Today   ? ? Reviewed by Leah Rise, RN (Case Manager) on 09/18/21 at 1101  Med List Status: <None>  ? ?Medication Order Taking? Sig Documenting Provider Last Dose Status Informant  ?acetaminophen (TYLENOL) 500 MG tablet [id number] No Take 1 tablet (500 mg total) by mouth every 6 (six) hours as needed. Chase Picket, MD Taking Active Self  ?bupivacaine liposome (EXPAREL) 1.3 %  injection 266 mg [id number]   Stark Klein, MD  Active   ?Ferrous Sulfate (IRON) 325 (65 Fe) MG TABS [id number] No Take by mouth in the morning and at bedtime. [provider] Taking Active Self  ?         ?Med Note [facility], Leah Baker   Wed Jul 24, 2021  9:37 AM) Taking 3 times Baker day  ?fluticasone (FLONASE) 50 MCG/ACT nasal spray [id number]  Place 2 sprays into both nostrils daily. Leah Stain, MD  Active   ?hydrocortisone (ANUSOL-HC) 2.5 % rectal cream [id number]  Place 1 application. rectally 2 (two) times daily. Leah Stain, MD  Active   ?Menthol-Camphor (TIGER BALM ARTHRITIS RUB EX) [id number] No Apply 1 application topically daily as needed (knee pain). [provider] Taking Active Self  ?norethindrone (AYGESTIN) 5 MG tablet [id number] No Take 3 tablets (15 mg total) by mouth daily. Leah Stain, MD Taking Active   ?sertraline (ZOLOFT) 100 MG tablet [id number]  Take 1 tablet (100 mg total) by mouth at bedtime. Leah Stain, MD  Active   ? ?  ?  ? ?  ? ? ?Patient Active Problem List  ? Diagnosis Date Noted  ? Hereditary hemochromatosis ([place]) 09/17/2021  ? Alopecia 09/17/2021  ? Elevated blood pressure reading 09/17/2021  ? Dental caries 09/17/2021  ? 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 Lymphoid hyperplasia 07/30/2020  ? Menorrhagia 11/27/2018  ? Rectal bleeding 11/26/2018  ? Liver lesion 09/09/2018  ? Hemorrhoid 11/29/2014  ? Iron  deficiency anemia 05/04/2014  ? Mood disorder ([place]) 02/20/2014  ? Tears of meniscus and ACL of left knee 09/08/2013  ? Depression 05/14/2011  ? H/O: cesarean section 08/28/2010  ? ? ?Conditions to be addressed/monitored per PCP order:  Anxiety ? ?Care Plan : RN Care Manager Plan of Care  ?Updates made by Ethelda Chick since 09/30/2021 12:00 AM  ?  ? ?Problem: Chronic Disease Management and Care Coordination Needs for Iron Deficiency Anemia, Internal hemorrhoid, Anxiety & Depression   ?Priority: High  ?  ? ?Long-Range Goal:  Development of Plan of Care for Chronic Disease Management and Care Coordination Needs (Iron Deficiency Anemia & Bleeding due to internal hemorroid; Anxiety & Depression)   ?Start Date: 07/24/2021  ?Expected End Date: 11/21/2021  ?Priority: High  ?Note:   ?Current Barriers:  ?Knowledge Deficits related to plan of care for management of Anxiety with Excessive Worry, ?Social Anxiety, and Depression: anxiety ?loss of energy/fatigue ?disturbed sleep ?decreased appetite and Iron Deficiency Anemia & Bleeding due to internal hemorrhoid  ?Care Coordination needs related to Lacks knowledge of community resource: Dental & Vision resources  ?Chronic Disease Management support and education needs related to Anxiety with Excessive Worry, ?Social Anxiety,, Depression: anxiety ?loss of energy/fatigue ?disturbed sleep ?decreased appetite, and Iron Deficiency Anemia  and Bleeding due to internal hemorroid ? ?RNCM Clinical Goal(s):  ?RNCM will help patient verbalize understanding of plan for management of Anxiety, Depression, and Iron Deficiency Anemia & bleeding due to internal hemorrhoid as evidenced by providing education regarding these chronic diseases. ?verbalize basic understanding of Anxiety, Depression, and Iron Deficiency Anemia & bleeding due to internal hemorrhoid disease process and self health management plan as evidenced by improved management of these chronic diseases ?take all medications exactly as prescribed and will call provider for medication related questions as evidenced by medication compliance    ?attend all scheduled medical appointments: 11/21/21 Dr Kennon Rounds - Annual GYN visit; as evidenced by attending all scheduled appointments        ?demonstrate ongoing adherence to prescribed treatment plan for Anxiety, Depression, and Iron Deficiency Anemia & bleeding due to internal hemorrhoid as evidenced by patient being compliant with treatment plan. ?continue to work with Consulting civil engineer and/or Social Worker to  address care management and care coordination needs related to Anxiety, Depression, and Iron Deficiency Anemia & Bleeding due to internal hemorrhoid  as evidenced by adherence to CM Team Scheduled appointments     ?work with Education officer, museum to address Lacks knowledge of community resource: Dental and Vision resources related to the management of Anxiety, Depression, and Iron Deficiency Anemia & bleeding due to internal hemorrhoid as evidenced by review of EMR and patient or Education officer, museum report     through collaboration with Consulting civil engineer, provider, and care team.  ? ?Interventions: ?Inter-disciplinary care team collaboration (see longitudinal plan of care) ?Evaluation of current treatment plan related to  self management and patient's adherence to plan as established by provider ?08/07/21: BSW completed telephone outreach with patient today. Patient states she is in need of dental and vision resources. Patient states she would also like some assistance pretaining to hair loss. She has been on strong hormones. BSW will send patients PCP and OB Baker message about Baker referral or assistance with hair loss. BSW will email resources to [email or web address]. ?08/23/21: BSW completed telephone follow up with patient. She stated she did receive the dental resources BSW sent to her, however she does have bad anxiety when it comes to the  dentist and wanted to speak with her PCP about it. Her PCP did complete Baker referral for the dermatologist and she does have Baker follow up appointment with her OB for medication adjustments. No other resources are needed at this time. ?09/30/21: BSW completed telephone outreach with patient. She stated she did speak with her PCP about going to the dentist due to her having Baker traumatic experience when she was younger she does not like to go know. Patient stated she did have some anxiety around her surgery and going to Baker new group, but after speaking with her PCP she feels better about it. No other resources  are needed at this time.  ? ? ?Anemia/Bleeding:  (Status: Goal on track: NO.) Long Term Goal  ?Assessment of understanding of anemia/bleeding disorder diagnosis ?Basic overview and discussion of anemia/bleeding

## 2021-09-30 NOTE — Patient Instructions (Signed)
Visit Information ? ?Ms. Casa was given information about Medicaid Managed Care team care coordination services as a part of their [place] Medicaid benefit. [name] verbally consented to engagement with the [facility] Managed Care team.  ? ?If you are experiencing a medical emergency, please call 911 or report to your local emergency department or urgent care.  ? ?If you have a non-emergency medical problem during routine business hours, please contact your provider's office and ask to speak with a nurse.  ? ?For questions related to your [facility], please call: [phone] or visit the homepage here: [email or web address] ? ?If you would like to schedule transportation through your [facility], please call the following number at least 2 days in advance of your appointment: [phone]. ? Rides for urgent appointments can also be made after hours by calling Member Services. ? ?Call the [place] at [phone], at any time, 24 hours a day, 7 days a week. If you are in danger or need immediate medical attention call 911. ? ?If you would like help to quit smoking, call 1-800-QUIT-NOW (734)079-7402) OR Espa?ol: 1-855-D?jelo-Ya 972-196-4357) o para m?s informaci?n haga clic aqu? or Text READY to 200-400 to register via text ? ?Ms. Eckstrom - following are the goals we discussed in your visit today:  ? Goals Addressed   ?None ?  ? ? ? ?Social Worker will follow up in 60 days .  ? ?Mickel Fuchs, BSW, [place] ?[place]  ?High Risk Managed Medicaid Team  ?[phone]  ? ?Following is a copy of your plan of care:  ?Care Plan : [place] of Care  ?Updates made by Ethelda Chick since 09/30/2021 12:00 AM  ?  ? ?Problem: Chronic Disease Management and Care Coordination Needs for Iron Deficiency Anemia, Internal hemorrhoid,  Anxiety & Depression   ?Priority: High  ?  ? ?Long-Range Goal: Development of Plan of Care for Chronic Disease Management and Care Coordination Needs (Iron Deficiency Anemia & Bleeding due to internal hemorroid; Anxiety & Depression)   ?Start Date: 07/24/2021  ?Expected End Date: 11/21/2021  ?Priority: High  ?Note:   ?Current Barriers:  ?Knowledge Deficits related to plan of care for management of Anxiety with Excessive Worry, ?Social Anxiety, and Depression: anxiety ?loss of energy/fatigue ?disturbed sleep ?decreased appetite and Iron Deficiency Anemia & Bleeding due to internal hemorrhoid  ?Care Coordination needs related to Lacks knowledge of community resource: Dental & Vision resources  ?Chronic Disease Management support and education needs related to Anxiety with Excessive Worry, ?Social Anxiety,, Depression: anxiety ?loss of energy/fatigue ?disturbed sleep ?decreased appetite, and Iron Deficiency Anemia  and Bleeding due to internal hemorroid ? ?RNCM Clinical Goal(s):  ?RNCM will help patient verbalize understanding of plan for management of Anxiety, Depression, and Iron Deficiency Anemia & bleeding due to internal hemorrhoid as evidenced by providing education regarding these chronic diseases. ?verbalize basic understanding of Anxiety, Depression, and Iron Deficiency Anemia & bleeding due to internal hemorrhoid disease process and self health management plan as evidenced by improved management of these chronic diseases ?take all medications exactly as prescribed and will call provider for medication related questions as evidenced by medication compliance    ?attend all scheduled medical appointments: 11/21/21 Dr Kennon Rounds - Annual GYN visit; as evidenced by attending all scheduled appointments        ?demonstrate ongoing adherence to prescribed treatment plan for Anxiety, Depression, and Iron Deficiency  Anemia & bleeding due to internal hemorrhoid as evidenced by patient being compliant with treatment  plan. ?continue to work with Consulting civil engineer and/or Social Worker to address care management and care coordination needs related to Anxiety, Depression, and Iron Deficiency Anemia & Bleeding due to internal hemorrhoid  as evidenced by adherence to CM Team Scheduled appointments     ?work with Education officer, museum to address Lacks knowledge of community resource: Dental and Vision resources related to the management of Anxiety, Depression, and Iron Deficiency Anemia & bleeding due to internal hemorrhoid as evidenced by review of EMR and patient or Education officer, museum report     through collaboration with Consulting civil engineer, provider, and care team.  ? ?Interventions: ?Inter-disciplinary care team collaboration (see longitudinal plan of care) ?Evaluation of current treatment plan related to  self management and patient's adherence to plan as established by provider ?08/07/21: BSW completed telephone outreach with patient today. Patient states she is in need of dental and vision resources. Patient states she would also like some assistance pretaining to hair loss. She has been on strong hormones. BSW will send patients PCP and OB a message about a referral or assistance with hair loss. BSW will email resources to [email or web address]. ?08/23/21: BSW completed telephone follow up with patient. She stated she did receive the dental resources BSW sent to her, however she does have bad anxiety when it comes to the dentist and wanted to speak with her PCP about it. Her PCP did complete a referral for the dermatologist and she does have a follow up appointment with her OB for medication adjustments. No other resources are needed at this time. ?09/30/21: BSW completed telephone outreach with patient. She stated she did speak with her PCP about going to the dentist due to her having a traumatic experience when she was younger she does not like to go know. Patient stated she did have some anxiety around her surgery and going to a new group, but after  speaking with her PCP she feels better about it. No other resources are needed at this time.  ? ? ?Anemia/Bleeding:  (Status: Goal on track: NO.) Long Term Goal  ?Assessment of understanding of anemia/bleeding disorder diagnosis ?Basic overview and discussion of anemia/bleeding disorder or acute disease state ?Review of most recent labs:  ?Lab Results  ?Component Value Date  ? WBC 6.0 08/20/2021  ? HGB 12.3 08/20/2021  ? HCT 36.5 08/20/2021  ? MCV 86.9 08/20/2021  ? PLT 405 (H) 08/20/2021  ? ?  ?Lab Results  ?Component Value Date  ? IRON 140 08/20/2021  ? TIBC 479 (H) 08/20/2021  ? FERRITIN 173 08/20/2021  ?   ?Medications reviewed ?- Counseled on importance of regular laboratory monitoring as directed by provider; ?- recommended promotion of rest and energy-conserving measures to manage fatigue, such as balancing activity with periods of rest ?- encouraged dietary changes to increase dietary intake of iron, Vitamin T26 and folic acid as advised/prescribed ?- Screening for signs and symptoms of depression related to chronic disease state;  ?- Assessed social determinant of health barriers;  ?Patient reports her energy level is "up & down".  Patient is active with her 34 year old daughter and goes outside (wears a mask due to increased pollen) for activities.  Patient's eating habits continue to focus on increased iron intake through food items such as green leafy vegetables including kale & spinach.  Patient continues to eat 2 vegetables, a meat and eliminating carbohydrate/starch food item at mealtime.  This RN Care Manager encouraged patient to not over due her physical activity but to listen to her body when she needs to rest and take a break. I continue to provide encouragement for the changes she is making to improve her overall health and how she is her best advocate for her own health with health decisions in the future. ?Patient reports occasional break-thru vaginal bleeding which she describes as moderate  amount of blood which "lasts for a couple of days then goes away".  Patient has been taking Aygestin incorrectly - taking 1 tablet TID versus 3 tablets once a day.  Patient read the prescription bottle label aloud to

## 2021-10-03 ENCOUNTER — Encounter: Payer: Self-pay | Admitting: Critical Care Medicine

## 2021-10-03 DIAGNOSIS — T887XXA Unspecified adverse effect of drug or medicament, initial encounter: Secondary | ICD-10-CM | POA: Insufficient documentation

## 2021-10-03 DIAGNOSIS — R300 Dysuria: Secondary | ICD-10-CM | POA: Insufficient documentation

## 2021-10-03 NOTE — Progress Notes (Signed)
Please see the MyChart message reply(ies) for my assessment and plan.    This patient gave consent for this Medical Advice Message and is aware that it may result in a bill to their insurance company, as well as the possibility of receiving a bill for a co-payment or deductible. They are an established patient, but are not seeking medical advice exclusively about a problem treated during an in person or video visit in the last seven days. I did not recommend an in person or video visit within seven days of my reply.    I spent a total of 10 minutes cumulative time within 7 days through MyChart messaging.  Isaiyah Feldhaus, MD   

## 2021-10-08 ENCOUNTER — Telehealth: Payer: Self-pay | Admitting: Critical Care Medicine

## 2021-10-08 NOTE — Telephone Encounter (Unsigned)
Copied from [place] [id number]. Topic: General - Other ?>> Oct 08, 2021 11:53 AM Tessa Lerner A wrote: ?Reason for CRM: The patient has returned a missed call from [facility]. ? ?Please contact further when possible ?

## 2021-10-08 NOTE — Telephone Encounter (Signed)
Called and she will come in morning to do labs ?

## 2021-10-09 ENCOUNTER — Ambulatory Visit: Payer: Medicaid Other | Attending: Critical Care Medicine

## 2021-10-09 DIAGNOSIS — R300 Dysuria: Secondary | ICD-10-CM | POA: Diagnosis not present

## 2021-10-10 ENCOUNTER — Other Ambulatory Visit: Payer: Self-pay | Admitting: Critical Care Medicine

## 2021-10-10 DIAGNOSIS — N3281 Overactive bladder: Secondary | ICD-10-CM

## 2021-10-10 LAB — URINALYSIS
Bilirubin, UA: NEGATIVE
Glucose, UA: NEGATIVE
Ketones, UA: NEGATIVE
Leukocytes,UA: NEGATIVE
Nitrite, UA: NEGATIVE
Specific Gravity, UA: >=1.03 — AB (ref 1.005–1.030)
Urobilinogen, Ur: 0.2 mg/dL (ref 0.2–1.0)
pH, UA: 5.5 (ref 5.0–7.5)

## 2021-10-10 MED ORDER — OXYBUTYNIN CHLORIDE 5 MG PO TABS
5.0000 mg | ORAL_TABLET | Freq: Three times a day (TID) | ORAL | 1 refills | Status: DC
Start: 2021-10-10 — End: 2022-11-17

## 2021-10-12 LAB — URINE CULTURE

## 2021-10-16 ENCOUNTER — Other Ambulatory Visit: Payer: Self-pay

## 2021-10-16 NOTE — Patient Outreach (Signed)
Medicaid Managed Care   Nurse Care Manager Note  10/16/2021 Name:  GENETTE HUERTAS MRN:  [id number] [name]:  [birth year]  Leah Baker is an 41 y.o. year old female who is a primary patient of Elsie Stain, MD.  The [facility] Managed Care Coordination team was consulted for assistance with:    Anxiety Depression Iron Deficiency Anemia  Ms. Hebert was given information about Medicaid Managed Care Coordination team services today. [name] Patient agreed to services and verbal consent obtained.  Engaged with patient by telephone for follow up visit in response to provider referral for case management and/or care coordination services.   Assessments/Interventions:  Review of past medical history, allergies, medications, health status, including review of consultants reports, laboratory and other test data, was performed as part of comprehensive evaluation and provision of chronic care management services.  SDOH (Social Determinants of Health) assessments and interventions performed:   Care Plan  Allergies  Allergen Reactions   Latex Itching and Rash    Medications Reviewed Today     Reviewed by Inge Rise, RN (Case Manager) on 10/16/21 at 62  Med List Status: <None>   Medication Order Taking? Sig Documenting Provider Last Dose Status Informant  acetaminophen (TYLENOL) 500 MG tablet [id number] No Take 1 tablet (500 mg total) by mouth every 6 (six) hours as needed. Chase Picket, MD Taking Active Self  bupivacaine liposome (EXPAREL) 1.3 % injection 266 mg [id number]   Stark Klein, MD  Active   Ferrous Sulfate (IRON) 325 (65 Fe) MG TABS [id number] No Take by mouth in the morning and at bedtime. [provider] Taking Active Self           Med Note [facility], [place] Malina A   Wed Jul 24, 2021  9:37 AM) Taking 3 times a day  fluticasone (FLONASE) 50 MCG/ACT nasal spray [id number]  Place 2 sprays into both nostrils daily. Elsie Stain, MD  Active    hydrocortisone (ANUSOL-HC) 2.5 % rectal cream [id number]  Place 1 application. rectally 2 (two) times daily. Elsie Stain, MD  Active   Menthol-Camphor (TIGER BALM ARTHRITIS RUB EX) [id number] No Apply 1 application topically daily as needed (knee pain). [provider] Taking Active Self  norethindrone (AYGESTIN) 5 MG tablet [id number] No Take 3 tablets (15 mg total) by mouth daily. Elsie Stain, MD Taking Active   oxybutynin (DITROPAN) 5 MG tablet [id number]  Take 1 tablet (5 mg total) by mouth 3 (three) times daily. Elsie Stain, MD  Active   sertraline (ZOLOFT) 100 MG tablet [id number]  Take 1 tablet (100 mg total) by mouth at bedtime. 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   Lymphoid hyperplasia 07/30/2020   Menorrhagia 11/27/2018   Rectal bleeding 11/26/2018   Liver lesion 09/09/2018   Hemorrhoid 11/29/2014   Iron deficiency anemia 05/04/2014   Mood disorder ([place]) 02/20/2014   Tears of meniscus and ACL of left knee 09/08/2013   Depression 05/14/2011   H/O: cesarean section 08/28/2010    Conditions to be addressed/monitored per PCP order:  Anxiety, Depression, and Iron Deficiency Anemia  Care Plan : RN  Care Manager Plan of Care  Updates made by Inge Rise, RN since 10/16/2021 12:00 AM     Problem: Chronic Disease Management and Care Coordination Needs for Iron Deficiency Anemia, Internal hemorrhoid, Anxiety & Depression   Priority: High     Long-Range Goal: Development of Plan of Care for Chronic Disease Management and Care Coordination Needs (Iron Deficiency Anemia & Bleeding due to internal hemorroid; Anxiety & Depression)   Start  Date: 07/24/2021  Expected End Date: 12/21/2021  Priority: High  Note:   Current Barriers:  Knowledge Deficits related to plan of care for management of Anxiety with Excessive Worry, Social Anxiety, and Depression: anxiety loss of energy/fatigue disturbed sleep decreased appetite and Iron Deficiency Anemia & Bleeding due to internal hemorrhoid  Care Coordination needs related to Lacks knowledge of community resource: Dental & Vision resources  Chronic Disease Management support and education needs related to Anxiety with Excessive Worry, Social Anxiety,, Depression: anxiety loss of energy/fatigue disturbed sleep decreased appetite, and Iron Deficiency Anemia  and Bleeding due to internal hemorroid  RNCM Clinical Goal(s):  RNCM will help patient verbalize understanding of plan for management of Anxiety, Depression, and Iron Deficiency Anemia & bleeding due to internal hemorrhoid as evidenced by providing education regarding these chronic diseases. verbalize basic understanding of Anxiety, Depression, and Iron Deficiency Anemia & bleeding due to internal hemorrhoid disease process and self health management plan as evidenced by improved management of these chronic diseases take all medications exactly as prescribed and will call provider for medication related questions as evidenced by medication compliance    attend all scheduled medical appointments: 11/12/21 [facility] Surgical Consult for hemorrhoids; 11/21/21 Dr Kennon Rounds - Annual GYN visit; as evidenced by attending all scheduled appointments        demonstrate ongoing adherence to prescribed treatment plan for Anxiety, Depression, and Iron Deficiency Anemia & bleeding due to internal hemorrhoid as evidenced by patient being compliant with treatment plan. continue to work with Consulting civil engineer and/or Social Worker to address care management and care coordination needs related to Anxiety, Depression, and Iron Deficiency Anemia & Bleeding due to  internal hemorrhoid  as evidenced by adherence to CM Team Scheduled appointments     work with Education officer, museum to address Lacks knowledge of community resource: Dental and Vision resources related to the management of Anxiety, Depression, and Iron Deficiency Anemia & bleeding due to internal hemorrhoid as evidenced by review of EMR and patient or Education officer, museum report     through collaboration with Consulting civil engineer, provider, and care team.   Interventions: Inter-disciplinary care team collaboration (see longitudinal plan of care) Evaluation of current treatment plan related to  self management and patient's adherence to plan as established by provider 08/07/21: BSW completed telephone outreach with patient today. Patient states she is in need of dental and vision resources. Patient states she would also like some assistance pretaining to hair loss. She has been on strong hormones. BSW will send patients PCP and OB a message about a referral or assistance with hair loss. BSW will email resources to [email or web address]. 08/23/21: BSW completed telephone follow up with patient. She stated she did receive the dental resources BSW sent to her, however she does have bad anxiety when it comes to the dentist and wanted to speak with her PCP about it. Her PCP did complete a referral for the dermatologist and she does have a follow up appointment with her OB for medication adjustments. No other resources are needed at  this time. 09/30/21: BSW completed telephone outreach with patient. She stated she did speak with her PCP about going to the dentist due to her having a traumatic experience when she was younger she does not like to go know. Patient stated she did have some anxiety around her surgery and going to a new group, but after speaking with her PCP she feels better about it. No other resources are needed at this time.    Anemia/Bleeding:  (Status: Goal on Track (progressing): YES.) Long Term Goal  Assessment of  understanding of anemia/bleeding disorder diagnosis Basic overview and discussion of anemia/bleeding disorder or acute disease state Review of most recent labs:  Lab Results  Component Value Date   WBC 6.0 08/20/2021   HGB 12.3 08/20/2021   HCT 36.5 08/20/2021   MCV 86.9 08/20/2021   PLT 405 (H) 08/20/2021     Lab Results  Component Value Date   IRON 140 08/20/2021   TIBC 479 (H) 08/20/2021   FERRITIN 173 08/20/2021     Medications reviewed - Counseled on importance of regular laboratory monitoring as directed by provider; - recommended promotion of rest and energy-conserving measures to manage fatigue, such as balancing activity with periods of rest - encouraged dietary changes to increase dietary intake of iron, Vitamin N56 and folic acid as advised/prescribed - Screening for signs and symptoms of depression related to chronic disease state;  - Assessed social determinant of health barriers;  Patient reports her energy level is "sluggish and more tired than usual" recently due to having interrupted sleep because of frequent trips to the bathroom during the night in order to urinate. Patient recently started having frequent urination and discomfort with the thought it may be a possible UTI.  Urinalysis done with results showing no UTI.  PCP ordered oxybutynin 5 mg 1 tab TID for overactive bladder.  Patient started taking new medication 2 days ago and now complains of dizziness, disorientated/swimmy headed when standing up, fatigue, nausea, increased sweating at night since taking oxybutynin.  Patient plans on messaging PCP via My Chart and see if she can reduce dose. Urology Referral made by PCP also.  Patient reports no break-thru vaginal bleeding since start of taking Aygestin correctly - 3 tablets once a day.  Patient diagnosed with Alopecia last month with most likely cause being Aygestin use.   Patient has scheduled appointment with Dr. Kennon Rounds on 6/ 22/ 2023 but may be receive an  earlier appointment if she can be worked in on an earlier date. Patient following PCP recommendations: patient attend [facility] Consult appointment on 11/12/2021 to discuss possible hemorrhoids removal and  discuss possible hysterectomy options with Dr. Kennon Rounds at next visit.  This RN Care Manager continues to provide encouragement for the changes she is making to improve her overall health and how she is her best advocate for her own health with health decisions in the future.   Anxiety & Depression  (Status:  Goal on track:  NO.)  Long Term Goal Evaluation of current treatment plan related to Anxiety and Depression, Lacks knowledge of community resource: Dental & Vision resources,  self-management and patient's adherence to plan as established by provider. Discussed plans with patient for ongoing care management follow up and provided patient with direct contact information for care management team Evaluation of current treatment plan related to Anxiety & Depression and patient's adherence to plan as established by provider Reviewed medications with patient and discussed use of antidepressant Reviewed scheduled/upcoming provider appointments including  Social Work referral for [place] resources - Completed Screening for signs and symptoms of depression related to chronic disease state   Discussed patient's level of anxiety or anxiousness.  Patient states anxiety has increased with new referral to Urology and the referral to a new surgical group at [facility].  Patient able to verbalize how her anxiety increases when changes or new situations occurs.  Patient able to carry on and work through her anxiety because she is able to focus on her goal of improved overall health and to address hemorrhoids and bleeding. Patient's anxiety remains and has not improved at this time.   Patient Goals/Self-Care Activities: Take medications as prescribed   Attend all scheduled provider  appointments Call pharmacy for medication refills 3-7 days in advance of running out of medications Call provider office for new concerns or questions  Work with the social worker to address care coordination needs and will continue to work with the clinical team to address health care and disease management related needs       Follow Up:  Patient agrees to Care Plan and Follow-up.  Plan: The Managed Medicaid care management team will reach out to the patient again over the next 45 days.  Date/time of next scheduled RN care management/care coordination outreach:  November 27, 2021 at 10:00 [place], [facility] Mobile: [phone]

## 2021-10-16 NOTE — Patient Instructions (Signed)
Visit Information ? ?Leah Baker was given information about Medicaid Managed Care team care coordination services as a part of their [place] Medicaid benefit. Leah Baker verbally consented to engagement with the [facility] Managed Care team.  ? ?If you are experiencing a medical emergency, please call 911 or report to your local emergency department or urgent care.  ? ?If you have a non-emergency medical problem during routine business hours, please contact your provider's office and ask to speak with a nurse.  ? ?For questions related to your [facility], please call: [phone] or visit the homepage here: [email or web address] ? ?If you would like to schedule transportation through your [facility], please call the following number at least 2 days in advance of your appointment: [phone]. ? Rides for urgent appointments can also be made after hours by calling Member Services. ? ?Call the [place] at [phone], at any time, 24 hours a day, 7 days a week. If you are in danger or need immediate medical attention call 911. ? ?If you would like help to quit smoking, call 1-800-QUIT-NOW (847)196-6493) OR Espa?ol: 1-855-D?jelo-Ya (323) 845-3064) o para m?s informaci?n haga clic aqu? or Text READY to 200-400 to register via text ? ?Leah Baker - following are the goals we discussed in your visit today:  Please see Patient Goals in the [place] of Care below. ? ?Please see education materials related to today's visit provided by MyChart link. ? ?Patient verbalizes understanding of instructions and care plan provided today and agrees to view in [place]. Active MyChart status and patient understanding of how to access instructions and care plan via MyChart confirmed with patient.    ? ?The Managed Medicaid care management team will reach out to the patient  again over the next 45 days.  ? ?Salvatore Marvel RN, BSN ?Community Care Coordinator ?[place] [facility] ?Mobile: [phone]  ? ?Following is a copy of your plan of care:  ?Care Plan : [place] of Care  ?Updates made by Inge Rise, RN since 10/16/2021 12:00 AM  ?  ? ?Problem: Chronic Disease Management and Care Coordination Needs for Iron Deficiency Anemia, Internal hemorrhoid, Anxiety & Depression   ?Priority: High  ?  ? ?Long-Range Goal: Development of Plan of Care for Chronic Disease Management and Care Coordination Needs (Iron Deficiency Anemia & Bleeding due to internal hemorroid; Anxiety & Depression)   ?Start Date: 07/24/2021  ?Expected End Date: 12/21/2021  ?Priority: High  ?Note:   ?Current Barriers:  ?Knowledge Deficits related to plan of care for management of Anxiety with Excessive Worry, ?Social Anxiety, and Depression: anxiety ?loss of energy/fatigue ?disturbed sleep ?decreased appetite and Iron Deficiency Anemia & Bleeding due to internal hemorrhoid  ?Care Coordination needs related to Lacks knowledge of community resource: Dental & Vision resources  ?Chronic Disease Management support and education needs related to Anxiety with Excessive Worry, ?Social Anxiety,, Depression: anxiety ?loss of energy/fatigue ?disturbed sleep ?decreased appetite, and Iron Deficiency Anemia  and Bleeding due to internal hemorroid ? ?RNCM Clinical Goal(s):  ?RNCM will help patient verbalize understanding of plan for management of Anxiety, Depression, and Iron Deficiency Anemia & bleeding due to internal hemorrhoid as evidenced by providing education regarding these chronic diseases. ?verbalize basic understanding of Anxiety, Depression, and Iron Deficiency Anemia & bleeding due to internal hemorrhoid disease process and self health management plan as evidenced by improved management of these chronic diseases ?  take all medications exactly as prescribed and will call provider for  medication related questions as evidenced by medication compliance    ?attend all scheduled medical appointments: 11/12/21 [facility] Surgical Consult for hemorrhoids; 11/21/21 Dr Kennon Rounds - Annual GYN visit; as evidenced by attending all scheduled appointments        ?demonstrate ongoing adherence to prescribed treatment plan for Anxiety, Depression, and Iron Deficiency Anemia & bleeding due to internal hemorrhoid as evidenced by patient being compliant with treatment plan. ?continue to work with Consulting civil engineer and/or Social Worker to address care management and care coordination needs related to Anxiety, Depression, and Iron Deficiency Anemia & Bleeding due to internal hemorrhoid  as evidenced by adherence to CM Team Scheduled appointments     ?work with Education officer, museum to address Lacks knowledge of community resource: Dental and Vision resources related to the management of Anxiety, Depression, and Iron Deficiency Anemia & bleeding due to internal hemorrhoid as evidenced by review of EMR and patient or Education officer, museum report     through collaboration with Consulting civil engineer, provider, and care team.  ? ?Interventions: ?Inter-disciplinary care team collaboration (see longitudinal plan of care) ?Evaluation of current treatment plan related to  self management and patient's adherence to plan as established by provider ?08/07/21: BSW completed telephone outreach with patient today. Patient states she is in need of dental and vision resources. Patient states she would also like some assistance pretaining to hair loss. She has been on strong hormones. BSW will send patients PCP and OB a message about a referral or assistance with hair loss. BSW will email resources to [email or web address]. ?08/23/21: BSW completed telephone follow up with patient. She stated she did receive the dental resources BSW sent to her, however she does have bad anxiety when it comes to the dentist and wanted to speak with her PCP about it. Her PCP did  complete a referral for the dermatologist and she does have a follow up appointment with her OB for medication adjustments. No other resources are needed at this time. ?09/30/21: BSW completed telephone outreach with patient. She stated she did speak with her PCP about going to the dentist due to her having a traumatic experience when she was younger she does not like to go know. Patient stated she did have some anxiety around her surgery and going to a new group, but after speaking with her PCP she feels better about it. No other resources are needed at this time.  ? ? ?Anemia/Bleeding:  (Status: Goal on Track (progressing): YES.) Long Term Goal  ?Assessment of understanding of anemia/bleeding disorder diagnosis ?Basic overview and discussion of anemia/bleeding disorder or acute disease state ?Review of most recent labs:  ?Lab Results  ?Component Value Date  ? WBC 6.0 08/20/2021  ? HGB 12.3 08/20/2021  ? HCT 36.5 08/20/2021  ? MCV 86.9 08/20/2021  ? PLT 405 (H) 08/20/2021  ? ?  ?Lab Results  ?Component Value Date  ? IRON 140 08/20/2021  ? TIBC 479 (H) 08/20/2021  ? FERRITIN 173 08/20/2021  ?   ?Medications reviewed ?- Counseled on importance of regular laboratory monitoring as directed by provider; ?- recommended promotion of rest and energy-conserving measures to manage fatigue, such as balancing activity with periods of rest ?- encouraged dietary changes to increase dietary intake of iron, Vitamin J85 and folic acid as advised/prescribed ?- Screening for signs and symptoms of depression related to chronic disease state;  ?- Assessed social determinant of health barriers;  ?Patient  reports her energy level is "sluggish and more tired than usual" recently due to having interrupted sleep because of frequent trips to the bathroom during the night in order to urinate. Patient recently started having frequent urination and discomfort with the thought it may be a possible UTI.  Urinalysis done with results showing no UTI.   PCP ordered oxybutynin 5 mg 1 tab TID for overactive bladder.  Patient started taking new medication 2 days ago and now complains of dizziness, disorientated/swimmy headed when standing up, fatigue, nause

## 2021-11-12 DIAGNOSIS — K625 Hemorrhage of anus and rectum: Secondary | ICD-10-CM | POA: Diagnosis not present

## 2021-11-12 DIAGNOSIS — K648 Other hemorrhoids: Secondary | ICD-10-CM | POA: Diagnosis not present

## 2021-11-21 ENCOUNTER — Ambulatory Visit (INDEPENDENT_AMBULATORY_CARE_PROVIDER_SITE_OTHER): Payer: Medicaid Other | Admitting: Family Medicine

## 2021-11-21 ENCOUNTER — Other Ambulatory Visit (HOSPITAL_COMMUNITY)
Admission: RE | Admit: 2021-11-21 | Discharge: 2021-11-21 | Disposition: A | Discharge status: Home / Self Care | Payer: Medicaid Other | Source: Ambulatory Visit | Attending: Family Medicine | Admitting: Family Medicine

## 2021-11-21 ENCOUNTER — Other Ambulatory Visit: Payer: Self-pay

## 2021-11-21 ENCOUNTER — Encounter: Payer: Self-pay | Admitting: Family Medicine

## 2021-11-21 VITALS — BP 146/93 | HR 91 | Wt 164.6 lb

## 2021-11-21 DIAGNOSIS — F329 Major depressive disorder, single episode, unspecified: Secondary | ICD-10-CM

## 2021-11-21 DIAGNOSIS — Z113 Encounter for screening for infections with a predominantly sexual mode of transmission: Secondary | ICD-10-CM

## 2021-11-21 DIAGNOSIS — D500 Iron deficiency anemia secondary to blood loss (chronic): Secondary | ICD-10-CM | POA: Diagnosis not present

## 2021-11-21 DIAGNOSIS — R030 Elevated blood-pressure reading, without diagnosis of hypertension: Secondary | ICD-10-CM

## 2021-11-21 DIAGNOSIS — Z124 Encounter for screening for malignant neoplasm of cervix: Secondary | ICD-10-CM

## 2021-11-21 DIAGNOSIS — N921 Excessive and frequent menstruation with irregular cycle: Secondary | ICD-10-CM | POA: Diagnosis not present

## 2021-11-21 DIAGNOSIS — Z1231 Encounter for screening mammogram for malignant neoplasm of breast: Secondary | ICD-10-CM

## 2021-11-21 DIAGNOSIS — Z01411 Encounter for gynecological examination (general) (routine) with abnormal findings: Secondary | ICD-10-CM

## 2021-11-21 DIAGNOSIS — Z01419 Encounter for gynecological examination (general) (routine) without abnormal findings: Secondary | ICD-10-CM | POA: Diagnosis not present

## 2021-11-21 MED ORDER — NORETHINDRONE ACETATE 5 MG PO TABS: 10.0000 mg | ORAL_TABLET | Freq: Every day | ORAL | 3 refills | Status: DC

## 2021-11-21 NOTE — Assessment & Plan Note (Signed)
Improved recently with less vaginal bleeding

## 2021-11-21 NOTE — Assessment & Plan Note (Signed)
44034 DASH diet is given--has been up x 3 lately

## 2021-11-21 NOTE — Patient Instructions (Signed)

## 2021-11-21 NOTE — Assessment & Plan Note (Signed)
93235 on Zoloft--will leave dosing where it is--referral to Ruthine Dose is not sleeping well and has some anxiety--may need to add Buspar or Cymbalta.

## 2021-11-21 NOTE — Assessment & Plan Note (Signed)
11886  Will attempt to decrease Aygestin to 10 mg--if doing well in 6 weeks or so, may decrease further, as she may have side effects related to this.

## 2021-11-22 LAB — HEPATITIS B SURFACE ANTIGEN: Hepatitis B Surface Ag: NEGATIVE

## 2021-11-22 LAB — RPR: RPR Ser Ql: NONREACTIVE

## 2021-11-22 LAB — HEPATITIS C ANTIBODY: Hep C Virus Ab: NONREACTIVE

## 2021-11-22 LAB — HIV ANTIBODY (ROUTINE TESTING W REFLEX): HIV Screen 4th Generation wRfx: NONREACTIVE

## 2021-11-25 LAB — CYTOLOGY - PAP
Adequacy: ABSENT
Chlamydia: NEGATIVE
Comment: NEGATIVE
Comment: NEGATIVE
Comment: NEGATIVE
Comment: NORMAL
Diagnosis: NEGATIVE
High risk HPV: NEGATIVE
Neisseria Gonorrhea: NEGATIVE
Trichomonas: NEGATIVE

## 2021-11-25 NOTE — BH Specialist Note (Deleted)
Integrated Behavioral Health via Telemedicine Visit  11/25/2021 Leah Baker [id number]  Number of [place] Clinician visits: No data recorded Session Start time: No data recorded  Session End time: No data recorded Total time in minutes: No data recorded  Referring Provider: *** Patient/Family location: *** [facility] Provider location: *** All persons participating in visit: *** Types of Service: {CHL AMB TYPE OF SERVICE:[phone]}  I connected with Leah Baker and/or Leah Baker's {family members:20773} via  Telephone or Video Enabled Telemedicine Application  (Video is Caregility application) and verified that I am speaking with the correct person using two identifiers. Discussed confidentiality: {YES/NO:21197}  I discussed the limitations of telemedicine and the availability of in person appointments.  Discussed there is a possibility of technology failure and discussed alternative modes of communication if that failure occurs.  I discussed that engaging in this telemedicine visit, they consent to the provision of behavioral healthcare and the services will be billed under their insurance.  Patient and/or legal guardian expressed understanding and consented to Telemedicine visit: {YES/NO:21197}  Presenting Concerns: Patient and/or family reports the following symptoms/concerns: *** Duration of problem: ***; Severity of problem: {Mild/Moderate/Severe:20260}  Patient and/or Family's Strengths/Protective Factors: {CHL AMB BH PROTECTIVE FACTORS:[phone]}  Goals Addressed: Patient will:  Reduce symptoms of: {IBH Symptoms:21014056}   Increase knowledge and/or ability of: {IBH Patient Tools:21014057}   Demonstrate ability to: {IBH Goals:21014053}  Progress towards Goals: {CHL AMB BH PROGRESS TOWARDS GOALS:[phone]}  Interventions: Interventions utilized:  {IBH Interventions:21014054} Standardized Assessments completed: {IBH Screening  Tools:21014051}  Patient and/or Family Response: ***  Assessment: Patient currently experiencing ***.   Patient may benefit from ***.  Plan: Follow up with behavioral health clinician on : *** Behavioral recommendations: *** Referral(s): {IBH Referrals:21014055}  I discussed the assessment and treatment plan with the patient and/or parent/guardian. They were provided an opportunity to ask questions and all were answered. They agreed with the plan and demonstrated an understanding of the instructions.   They were advised to call back or seek an in-person evaluation if the symptoms worsen or if the condition fails to improve as anticipated.  Leah Hamman Solana Coggin, LCSW

## 2021-11-27 ENCOUNTER — Ambulatory Visit: Payer: Medicaid Other

## 2021-11-27 ENCOUNTER — Other Ambulatory Visit: Payer: Self-pay | Admitting: *Deleted

## 2021-11-27 NOTE — Patient Instructions (Signed)
Visit Information  [name]  - as a part of your Medicaid benefit, you are eligible for care management and care coordination services at no cost or copay. I was unable to reach you by phone today but would be happy to help you with your health related needs. Please feel free to call me @ [phone].   A member of the Managed Medicaid care management team will reach out to you again over the next 14 days.   Jomes Giraldo RN, BSN [place]  [facility] RN Care Coordinator   

## 2021-11-27 NOTE — Patient Outreach (Signed)
Care Coordination  11/27/2021  Leah Baker 1980/11/09 [id number]   Medicaid Managed Care   Unsuccessful Outreach Note  11/27/2021 Name: Leah Baker MRN: [id number] [name]: 1980-08-05  Referred by: Elsie Stain, MD Reason for referral : High Risk Managed Medicaid (Unsuccessful RNCM telephone outreach)   An unsuccessful telephone outreach was attempted today. The patient was referred to the case management team for assistance with care management and care coordination.   Follow Up Plan: A HIPAA compliant phone message was left for the patient providing contact information and requesting a return call.   Leah Joiner RN, BSN [place]  [employer] manager

## 2021-12-02 ENCOUNTER — Other Ambulatory Visit: Payer: Self-pay

## 2021-12-02 NOTE — Patient Outreach (Signed)
Medicaid Managed Care Social Work Note  12/02/2021 Name:  Leah Baker MRN:  [id number] [name]:  10-10-1980  Leah Baker is an 41 y.o. year old female who is a primary patient of [name] Burnett Harry, MD.  The Medicaid Managed Care Coordination team was consulted for assistance with:  Community Resources   Ms. Stambaugh was given information about Medicaid Managed Care Coordination team services today. [name] Patient agreed to services and verbal consent obtained.  Engaged with patient  for by telephone forfollow up visit in response to referral for case management and/or care coordination services.   Assessments/Interventions:  Review of past medical history, allergies, medications, health status, including review of consultants reports, laboratory and other test data, was performed as part of comprehensive evaluation and provision of chronic care management services.  SDOH: (Social Determinant of Health) assessments and interventions performed: BSW completed telephone outreach with patient. She is set to have surgery on 12/10/21 and is a little nervous about it. Patient states no resources are needed at this time. BSW will follow up with patient after surgery.   Advanced Directives Status:  Not addressed in this encounter.  Care Plan                 Allergies  Allergen Reactions   Latex Itching and Rash    Medications Reviewed Today     Reviewed by Annabell Howells, RN (Registered Nurse) on 11/21/21 at [place] List Status: <None>   Medication Order Taking? Sig Documenting Provider Last Dose Status Informant  acetaminophen (TYLENOL) 500 MG tablet [id number] Yes Take 1 tablet (500 mg total) by mouth every 6 (six) hours as needed. Chase Picket, MD Taking Active Self  Ferrous Sulfate (IRON) 325 (65 Fe) MG TABS [id number] Yes Take by mouth in the morning and at bedtime. [provider] Taking Active Self           Med Note [facility], [name]   Wed Jul 24, 2021   9:37 AM) Taking 3 times a day  fluticasone (FLONASE) 50 MCG/ACT nasal spray [id number] Yes Place 2 sprays into both nostrils daily. Elsie Stain, MD Taking Active   hydrocortisone (ANUSOL-HC) 2.5 % rectal cream [id number] Yes Place 1 application. rectally 2 (two) times daily. Elsie Stain, MD Taking Active   Menthol-Camphor (TIGER [facility] ARTHRITIS RUB EX) [id number] Yes Apply 1 application topically daily as needed (knee pain). [provider] Taking Active Self  norethindrone (AYGESTIN) 5 MG tablet [id number] Yes Take 3 tablets (15 mg total) by mouth daily. Elsie Stain, MD Taking Active   oxybutynin (DITROPAN) 5 MG tablet [id number] Yes Take 1 tablet (5 mg total) by mouth 3 (three) times daily. Elsie Stain, MD Taking Active   sertraline (ZOLOFT) 100 MG tablet [id number] Yes Take 1 tablet (100 mg total) by mouth at bedtime. 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   Follow up:  Patient agrees to Care Plan and Follow-up.  Plan: The Managed Medicaid care management team will reach out to the patient again over the next 30 days.  Date/time of next scheduled Social Work care management/care  coordination outreach:  01/02/22  Mickel Fuchs, Arita Miss, [place] Managed Medicaid Team  [phone]

## 2021-12-02 NOTE — Patient Instructions (Signed)
Visit Information  Ms. Driver was given information about Medicaid Managed Care team care coordination services as a part of their [place] Medicaid benefit. [name] verbally consented to engagement with the [facility] Managed Care team.   If you are experiencing a medical emergency, please call 911 or report to your local emergency department or urgent care.   If you have a non-emergency medical problem during routine business hours, please contact your provider's office and ask to speak with a nurse.   For questions related to your [facility], please call: [phone] or visit the homepage here: [email or web address]  If you would like to schedule transportation through your [facility], please call the following number at least 2 days in advance of your appointment: [phone]   Rides for urgent appointments can also be made after hours by calling Member Services.  Call the [place] at [phone], at any time, 24 hours a day, 7 days a week. If you are in danger or need immediate medical attention call 911.  If you would like help to quit smoking, call 1-800-QUIT-NOW 3120236499) OR Espaol: 1-855-Djelo-Ya ([phone]) o para ms informacin haga clic aqu or Text READY to 200-400 to register via text  Ms. Casavant - following are the goals we discussed in your visit today:   Goals Addressed   None      Social Worker will follow up in 30 days .   Mickel Fuchs, BSW, [place] Managed Medicaid Team  [phone]   Following is a copy of your plan of care:  There are no care plans that you recently modified to display for this patient.

## 2021-12-05 ENCOUNTER — Telehealth: Payer: Self-pay | Admitting: Clinical

## 2021-12-05 NOTE — Telephone Encounter (Signed)
Attempt to call to reschedule appointment from July 10; Left HIPPA-compliant message to call back Roselyn Reef from [employer] for [employer] at [facility] at  [phone] [facility] office); left MyChart message for pt.

## 2021-12-10 ENCOUNTER — Encounter (HOSPITAL_BASED_OUTPATIENT_CLINIC_OR_DEPARTMENT_OTHER): Payer: Medicaid Other | Admitting: Critical Care Medicine

## 2021-12-10 DIAGNOSIS — I10 Essential (primary) hypertension: Secondary | ICD-10-CM | POA: Diagnosis not present

## 2021-12-10 DIAGNOSIS — K648 Other hemorrhoids: Secondary | ICD-10-CM | POA: Diagnosis not present

## 2021-12-10 MED ORDER — AMLODIPINE BESYLATE 5 MG PO TABS: 5.0000 mg | ORAL_TABLET | Freq: Every day | ORAL | 1 refills | Status: DC

## 2021-12-10 NOTE — Telephone Encounter (Signed)
Please see the MyChart message reply(ies) for my assessment and plan.    This patient gave consent for this Medical Advice Message and is aware that it may result in a bill to [employer], as well as the possibility of receiving a bill for a co-payment or deductible. They are an established patient, but are not seeking medical advice exclusively about a problem treated during an in person or video visit in the last seven days. I did not recommend an in person or video visit within seven days of my reply.    I spent a total of 7 minutes cumulative time within 7 days through [employer].  Asencion Noble, MD

## 2021-12-11 NOTE — BH Specialist Note (Signed)
Integrated Behavioral Health via Telemedicine Visit  12/18/2021 CHARMAYNE ODELL [id number]  Number of [place] Clinician visits: 1- Initial Visit  Session Start time: [phone]   Session End time: 0938  Total time in minutes: 46   Referring Provider: Darron Doom, MD Patient/Family location: Home [facility] Provider location: [facility] [place] [facility]  All persons participating in visit: [name] and [place]   Types of Service: Individual psychotherapy and Video visit  I connected with [name] and/or Herbert Spires Straughter's  n/a  via  Telephone or Video Enabled Telemedicine Application  (Video is Caregility application) and verified that I am speaking with the correct person using two identifiers. Discussed confidentiality: Yes   I discussed the limitations of telemedicine and the availability of in person appointments.  Discussed there is a possibility of technology failure and discussed alternative modes of communication if that failure occurs.  I discussed that engaging in this telemedicine visit, they consent to the provision of behavioral healthcare and the services will be billed under their insurance.  Patient and/or legal guardian expressed understanding and consented to Telemedicine visit: Yes   Presenting Concerns: Patient and/or family reports the following symptoms/concerns: Feeling overwhelmed and "scatterbrained" with health issues and life stress; difficulty falling asleep, waking with heart palpitations, high blood pressure; taking Zoloft as prescribed; open to implementing additional self-coping strategy.  Duration of problem: Increasing over time; Severity of problem:  moderately severe  Patient and/or Family's Strengths/Protective Factors: Concrete supports in place (healthy food, safe environments, etc.) and Sense of purpose  Goals Addressed: Patient will:  Reduce symptoms of: anxiety,  depression, insomnia, and stress   Increase knowledge and/or ability of: healthy habits and self-management skills   Demonstrate ability to: Increase healthy adjustment to current life circumstances and Increase motivation to adhere to plan of care  Progress towards Goals: Ongoing  Interventions: Interventions utilized:  Solution-Focused Strategies, Functional Assessment of ADLs, and Psychoeducation and/or Health Education Standardized Assessments completed: Not Needed  Patient and/or Family Response: Patient agrees with treatment plan.   Assessment: Patient currently experiencing Adjustment disorder with mixed anxious and depressed mood.   Patient may benefit from psychoeducation and brief therapeutic interventions regarding coping with symptoms of anxiety, depression, stress .  Plan: Follow up with behavioral health clinician on : Two weeks Behavioral recommendations:  -Continue taking Zoloft and iron as prescribed (consider taking iron with a little orange juice instead of water) -Continue using calming sounds and relaxation breathing as needed throughout the day -Begin Worry Time strategy, as discussed. Start by setting up start and end time reminders on phone today; continue daily for two weeks.  Referral(s): [place] (In Clinic)  I discussed the assessment and treatment plan with the patient and/or parent/guardian. They were provided an opportunity to ask questions and all were answered. They agreed with the plan and demonstrated an understanding of the instructions.   They were advised to call back or seek an in-person evaluation if the symptoms worsen or if the condition fails to improve as anticipated.  Garlan Fair, LCSW     11/21/2021    9:40 AM 09/17/2021    8:51 AM 07/24/2021   10:28 AM 07/24/2021    9:52 AM 05/02/2021    8:40 AM  Depression screen PHQ 2/9  Decreased Interest  '2  2 3  '$ Down, Depressed, Hopeless '3 3  2 2  '$ PHQ - 2  Score 3 5  4 5  Altered sleeping '3 2   3  '$ Tired, decreased energy '2 3  2 2  '$ Change in appetite '2 2  3 3  '$ Feeling bad or failure about yourself  '2 1  1 1  '$ Trouble concentrating '2 1  2 2  '$ Moving slowly or fidgety/restless 0 0 0  0  Suicidal thoughts 0 0 0  0  PHQ-9 Score '14 14   16  '$ Difficult doing work/chores   Somewhat difficult        11/21/2021    9:41 AM 09/17/2021    8:51 AM 05/02/2021    8:40 AM 12/06/2020   11:05 AM  GAD 7 : Generalized Anxiety Score  Nervous, Anxious, on Edge '3 3 3 2  '$ Control/stop worrying '3 3 2 2  '$ Worry too much - different things '3 3 2 2  '$ Trouble relaxing '3 3 3 2  '$ Restless '1 2 2 2  '$ Easily annoyed or irritable '1 1 1 2  '$ Afraid - awful might happen '3 3 3 2  '$ Total GAD 7 Score '17 18 16 '$ 14

## 2021-12-12 ENCOUNTER — Telehealth: Payer: Self-pay | Admitting: Hematology

## 2021-12-12 NOTE — Telephone Encounter (Signed)
Rescheduled upcoming appointment due to provider's template. Patient is aware of changes. 

## 2021-12-16 ENCOUNTER — Other Ambulatory Visit: Payer: Self-pay | Admitting: *Deleted

## 2021-12-16 ENCOUNTER — Telehealth: Payer: Self-pay | Admitting: Critical Care Medicine

## 2021-12-16 MED ORDER — BLOOD PRESSURE KIT DEVI: 0 refills | Status: DC

## 2021-12-16 NOTE — Patient Outreach (Signed)
Medicaid Managed Care   Nurse Care Manager Note  12/16/2021 Name:  Leah Baker MRN:  [id number] [name]:  24-Oct-1980  Leah Baker is an 41 y.o. year old female who is a primary patient of Leah Gaskins Burnett Harry, MD.  The [facility] Managed Care Coordination team was consulted for assistance with:    HTN Anxiety Depression Anemia  Leah Baker was given information about Medicaid Managed Care Coordination team services today. Leah Baker Patient agreed to services and verbal consent obtained.  Engaged with patient by telephone for follow up visit in response to provider referral for case management and/or care coordination services.   Assessments/Interventions:  Review of past medical history, allergies, medications, health status, including review of consultants reports, laboratory and other test data, was performed as part of comprehensive evaluation and provision of chronic care management services.  SDOH (Social Determinants of Health) assessments and interventions performed:   Care Plan  Allergies  Allergen Reactions   Latex Itching and Rash    Medications Reviewed Today     Reviewed by Leah Montane, RN (Registered Nurse) on 12/16/21 at [id number]  Med List Status: <None>   Medication Order Taking? Sig Documenting Provider Last Dose Status Informant  acetaminophen (TYLENOL) 500 MG tablet [id number] Yes Take 1 tablet (500 mg total) by mouth every 6 (six) hours as needed. Leah Picket, MD Taking Active Self  amLODipine (NORVASC) 5 MG tablet [id number] Yes Take 1 tablet (5 mg total) by mouth daily. Leah Stain, MD Taking Active   Ferrous Sulfate (IRON) 325 (65 Fe) MG TABS [id number] Yes Take by mouth in the morning and at bedtime. [provider] Taking Active Self           Med Note [facility], [name]   Wed Jul 24, 2021  9:37 AM) Taking 3 times a day  fluticasone (FLONASE) 50 MCG/ACT nasal spray [id number] Yes Place 2 sprays into both nostrils daily. Leah Stain, MD Taking Active   hydrocortisone (ANUSOL-HC) 2.5 % rectal cream [id number] Yes Place 1 application. rectally 2 (two) times daily. Leah Stain, MD Taking Active   Menthol-Camphor (TIGER [facility] ARTHRITIS RUB EX) [id number] Yes Apply 1 application topically daily as needed (knee pain). [provider] Taking Active Self  norethindrone (AYGESTIN) 5 MG tablet [id number] Yes Take 2 tablets (10 mg total) by mouth daily. Leah Jude, MD Taking Active   oxybutynin (DITROPAN) 5 MG tablet [id number] Yes Take 1 tablet (5 mg total) by mouth 3 (three) times daily. Leah Stain, MD Taking Active   psyllium (METAMUCIL) 58.6 % packet [id number] Yes Take 1 packet by mouth daily. [provider] Taking Active   sertraline (ZOLOFT) 100 MG tablet [id number] Yes Take 1 tablet (100 mg total) by mouth at bedtime. 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   Lymphoid hyperplasia 07/30/2020   Menorrhagia 11/27/2018   Rectal bleeding 11/26/2018   Liver lesion 09/09/2018   Hemorrhoid 11/29/2014   Iron deficiency anemia 05/04/2014   Mood disorder ([place]) 02/20/2014   Tears of meniscus and ACL of left knee 09/08/2013  Depression 05/14/2011   H/O: cesarean section 08/28/2010    Conditions to be addressed/monitored per PCP order:  HTN, Anxiety, Depression, and anemia  Care Plan : RN Care Manager Plan of Care  Updates made by Leah Montane, RN since 12/16/2021 12:00 AM     Problem: Chronic Disease Management and Care Coordination Needs for Iron Deficiency Anemia, Internal hemorrhoid, Anxiety & Depression   Priority: High      Long-Range Goal: Development of Plan of Care for Chronic Disease Management and Care Coordination Needs (Iron Deficiency Anemia & Bleeding due to internal hemorroid; Anxiety & Depression)   Start Date: 07/24/2021  Expected End Date: 02/28/2022  Priority: High  Note:   Current Barriers:  Knowledge Deficits related to plan of care for management of Anxiety with Excessive Worry, Social Anxiety, and Depression: anxiety loss of energy/fatigue disturbed sleep decreased appetite and Iron Deficiency Anemia & Bleeding due to internal hemorrhoid  Care Coordination needs related to Lacks knowledge of community resource: Dental & Vision resources  Chronic Disease Management support and education needs related to Anxiety with Excessive Worry, Social Anxiety,, Depression: anxiety loss of energy/fatigue disturbed sleep decreased appetite, and Iron Deficiency Anemia  and Bleeding due to internal hemorroid  RNCM Clinical Goal(s):  RNCM will help patient verbalize understanding of plan for management of Anxiety, Depression, and Iron Deficiency Anemia & bleeding due to internal hemorrhoid as evidenced by providing education regarding these chronic diseases. verbalize basic understanding of Anxiety, Depression, and Iron Deficiency Anemia & bleeding due to internal hemorrhoid disease process and self health management plan as evidenced by improved management of these chronic diseases take all medications exactly as prescribed and will call provider for medication related questions as evidenced by medication compliance    attend all scheduled medical appointments: 11/12/21 [facility] Surgical Consult for hemorrhoids; 11/21/21 Dr Kennon Rounds - Annual GYN visit; as evidenced by attending all scheduled appointments        demonstrate ongoing adherence to prescribed treatment plan for Anxiety, Depression, and Iron Deficiency Anemia & bleeding due to internal hemorrhoid as evidenced by patient being compliant with  treatment plan. continue to work with Consulting civil engineer and/or Social Worker to address care management and care coordination needs related to Anxiety, Depression, and Iron Deficiency Anemia & Bleeding due to internal hemorrhoid  as evidenced by adherence to CM Team Scheduled appointments     work with Education officer, museum to address Lacks knowledge of community resource: Dental and Vision resources related to the management of Anxiety, Depression, and Iron Deficiency Anemia & bleeding due to internal hemorrhoid as evidenced by review of EMR and patient or Education officer, museum report     through collaboration with Consulting civil engineer, provider, and care team.   Interventions: Inter-disciplinary care team collaboration (see longitudinal plan of care) Evaluation of current treatment plan related to  self management and patient's adherence to plan as established by provider 08/07/21: BSW completed telephone outreach with patient today. Patient states she is in need of dental and vision resources. Patient states she would also like some assistance pretaining to hair loss. She has been on strong hormones. BSW will send patients PCP and OB a message about a referral or assistance with hair loss. BSW will email resources to [email or web address]. 08/23/21: BSW completed telephone follow up with patient. She stated she did receive the dental resources BSW sent to her, however she does have bad anxiety when it comes to the dentist and wanted to speak with her PCP about it.  Her PCP did complete a referral for the dermatologist and she does have a follow up appointment with her OB for medication adjustments. No other resources are needed at this time. 09/30/21: BSW completed telephone outreach with patient. She stated she did speak with her PCP about going to the dentist due to her having a traumatic experience when she was younger she does not like to go know. Patient stated she did have some anxiety around her surgery and going to a new group,  but after speaking with her PCP she feels better about it. No other resources are needed at this time.   Hypertension Interventions:  (Status:  New goal.) Long Term Goal Last practice recorded BP readings:  BP Readings from Last 3 Encounters:  11/21/21 (!) 146/93  09/17/21 (!) 132/93  08/20/21 (!) 148/98  Most recent eGFR/CrCl:  Lab Results  Component Value Date   EGFR 72 07/30/2020    No components found for: "CRCL"  Evaluation of current treatment plan related to hypertension self management and patient's adherence to plan as established by provider Provided education to patient re: stroke prevention, s/s of heart attack and stroke Reviewed medications with patient and discussed importance of compliance Provided assistance with obtaining home blood pressure monitor via collaboration with PCP(request to send order to [employer]); Discussed plans with patient for ongoing care management follow up and provided patient with direct contact information for care management team Reviewed scheduled/upcoming provider appointments including:  Advised to attend all appointments: 7/19 with [place], 8/23 with Dr. [name], 9/14 with Dr. Kennon Rounds, and 9/20 with Dr. Irene Limbo   Anemia/Bleeding:  (Status: Goal on Track (progressing): YES.) Long Term Goal  Assessment of understanding of anemia/bleeding disorder diagnosis Basic overview and discussion of anemia/bleeding disorder or acute disease state Review of most recent labs:  Lab Results  Component Value Date   WBC 6.0 08/20/2021   HGB 12.3 08/20/2021   HCT 36.5 08/20/2021   MCV 86.9 08/20/2021   PLT 405 (H) 08/20/2021     Lab Results  Component Value Date   IRON 140 08/20/2021   TIBC 479 (H) 08/20/2021   FERRITIN 173 08/20/2021     Medications reviewed - Counseled on importance of regular laboratory monitoring as directed by provider; - Advised to call provider or 911 if active bleeding or signs and symptoms of active bleeding  occur; - recommended promotion of rest and energy-conserving measures to manage fatigue, such as balancing activity with periods of rest - encouraged dietary changes to increase dietary intake of iron, Vitamin Z32 and folic acid as advised/prescribed - Assessed social determinant of health barriers;  Patient reports irregular vaginal bleeding since decreasing Aygestin to two tablets daily, changing pad 3 times daily Advised patient to report heavy bleeding( soaking a pad in an hour) to her provider Advised patient to keep track of her bleeding on a calendar or journal and share this with her provider Encouraged patient to continue eating iron rich foods, discussed examples   Anxiety & Depression  (Status:  Goal on track:  Yes.)  Long Term Goal Evaluation of current treatment plan related to Anxiety and Depression, Lacks knowledge of community resource: Dental & Vision resources,  self-management and patient's adherence to plan as established by provider. Discussed plans with patient for ongoing care management follow up and provided patient with direct contact information for care management team Evaluation of current treatment plan related to Anxiety & Depression and patient's adherence to plan as established by provider Advised  patient to attend visit with Behavioral Health clinician on 12/18/21 Reviewed medications with patient and discussed use of antidepressant Reviewed scheduled/upcoming provider appointments including Screening for signs and symptoms of depression related to chronic disease state   Provieded therapeutic listening  Patient Goals/Self-Care Activities: Take medications as prescribed   Attend all scheduled provider appointments Call pharmacy for medication refills 3-7 days in advance of running out of medications Call provider office for new concerns or questions  Work with the social worker to address care coordination needs and will continue to work with the clinical  team to address health care and disease management related needs       Follow Up:  Patient agrees to Care Plan and Follow-up.  Plan: The Managed Medicaid care management team will reach out to the patient again over the next 60 days.  Date/time of next scheduled RN care management/care coordination outreach:  02/21/22 @ [place] RN, [place] RN Care Coordinator

## 2021-12-16 NOTE — Telephone Encounter (Signed)
Bp cuffs sent

## 2021-12-16 NOTE — Patient Instructions (Signed)
Visit Information  Leah Baker was given information about Medicaid Managed Care team care coordination services as a part of their [place] Medicaid benefit. Leah Baker verbally consented to engagement with the [facility] Managed Care team.   If you are experiencing a medical emergency, please call 911 or report to your local emergency department or urgent care.   If you have a non-emergency medical problem during routine business hours, please contact your provider's office and ask to speak with a nurse.   For questions related to your [facility], please call: [phone] or visit the homepage here: [email or web address]  If you would like to schedule transportation through your [facility], please call the following number at least 2 days in advance of your appointment: [phone]   Rides for urgent appointments can also be made after hours by calling Member Services.  Call the [place] at [phone], at any time, 24 hours a day, 7 days a week. If you are in danger or need immediate medical attention call 911.  If you would like help to quit smoking, call 1-800-QUIT-NOW (587)299-9621) OR Espaol: 1-855-Djelo-Ya (5-809-983-3825) o para ms informacin haga clic aqu or Text READY to 200-400 to register via text  Ms. Maugeri,   Please see education materials related to HTN and anemia provided by MyChart link.  Patient verbalizes understanding of instructions and care plan provided today and agrees to view in [place]. Active MyChart status and patient understanding of how to access instructions and care plan via MyChart confirmed with patient.     Telephone follow up appointment with Managed Medicaid care management team member scheduled for:02/21/22 @ [place] RN, [place] RN Care  Coordinator   Following is a copy of your plan of care:  Care Plan : RN Care Manager Plan of Care  Updates made by Melissa Montane, RN since 12/16/2021 12:00 AM     Problem: Chronic Disease Management and Care Coordination Needs for Iron Deficiency Anemia, Internal hemorrhoid, Anxiety & Depression   Priority: High     Long-Range Goal: Development of Plan of Care for Chronic Disease Management and Care Coordination Needs (Iron Deficiency Anemia & Bleeding due to internal hemorroid; Anxiety & Depression)   Start Date: 07/24/2021  Expected End Date: 02/28/2022  Priority: High  Note:   Current Barriers:  Knowledge Deficits related to plan of care for management of Anxiety with Excessive Worry, Social Anxiety, and Depression: anxiety loss of energy/fatigue disturbed sleep decreased appetite and Iron Deficiency Anemia & Bleeding due to internal hemorrhoid  Care Coordination needs related to Lacks knowledge of community resource: Dental & Vision resources  Chronic Disease Management support and education needs related to Anxiety with Excessive Worry, Social Anxiety,, Depression: anxiety loss of energy/fatigue disturbed sleep decreased appetite, and Iron Deficiency Anemia  and Bleeding due to internal hemorroid  RNCM Clinical Goal(s):  RNCM will help patient verbalize understanding of plan for management of Anxiety, Depression, and Iron Deficiency Anemia & bleeding due to internal hemorrhoid as evidenced by providing education regarding these chronic diseases. verbalize basic understanding of Anxiety, Depression, and Iron Deficiency Anemia & bleeding due to internal hemorrhoid disease process and self health management plan as evidenced by improved management of these chronic diseases take all medications exactly as prescribed and will call provider for medication related questions as evidenced by medication compliance    attend all scheduled medical appointments: 11/12/21  [facility]  Surgical Consult for hemorrhoids; 11/21/21 Dr Kennon Rounds - Annual GYN visit; as evidenced by attending all scheduled appointments        demonstrate ongoing adherence to prescribed treatment plan for Anxiety, Depression, and Iron Deficiency Anemia & bleeding due to internal hemorrhoid as evidenced by patient being compliant with treatment plan. continue to work with Consulting civil engineer and/or Social Worker to address care management and care coordination needs related to Anxiety, Depression, and Iron Deficiency Anemia & Bleeding due to internal hemorrhoid  as evidenced by adherence to CM Team Scheduled appointments     work with Education officer, museum to address Lacks knowledge of community resource: Dental and Vision resources related to the management of Anxiety, Depression, and Iron Deficiency Anemia & bleeding due to internal hemorrhoid as evidenced by review of EMR and patient or Education officer, museum report     through collaboration with Consulting civil engineer, provider, and care team.   Interventions: Inter-disciplinary care team collaboration (see longitudinal plan of care) Evaluation of current treatment plan related to  self management and patient's adherence to plan as established by provider 08/07/21: BSW completed telephone outreach with patient today. Patient states she is in need of dental and vision resources. Patient states she would also like some assistance pretaining to hair loss. She has been on strong hormones. BSW will send patients PCP and OB a message about a referral or assistance with hair loss. BSW will email resources to [email or web address]. 08/23/21: BSW completed telephone follow up with patient. She stated she did receive the dental resources BSW sent to her, however she does have bad anxiety when it comes to the dentist and wanted to speak with her PCP about it. Her PCP did complete a referral for the dermatologist and she does have a follow up appointment with her OB for medication adjustments. No other  resources are needed at this time. 09/30/21: BSW completed telephone outreach with patient. She stated she did speak with her PCP about going to the dentist due to her having a traumatic experience when she was younger she does not like to go know. Patient stated she did have some anxiety around her surgery and going to a new group, but after speaking with her PCP she feels better about it. No other resources are needed at this time.   Hypertension Interventions:  (Status:  New goal.) Long Term Goal Last practice recorded BP readings:  BP Readings from Last 3 Encounters:  11/21/21 (!) 146/93  09/17/21 (!) 132/93  08/20/21 (!) 148/98  Most recent eGFR/CrCl:  Lab Results  Component Value Date   EGFR 72 07/30/2020    No components found for: "CRCL"  Evaluation of current treatment plan related to hypertension self management and patient's adherence to plan as established by provider Provided education to patient re: stroke prevention, s/s of heart attack and stroke Reviewed medications with patient and discussed importance of compliance Provided assistance with obtaining home blood pressure monitor via collaboration with PCP(request to send order to [employer]); Discussed plans with patient for ongoing care management follow up and provided patient with direct contact information for care management team Reviewed scheduled/upcoming provider appointments including:  Advised to attend all appointments: 7/19 with [place], 8/23 with Dr. [name], 9/14 with Dr. Kennon Rounds, and 9/20 with Dr. Irene Limbo   Anemia/Bleeding:  (Status: Goal on Track (progressing): YES.) Long Term Goal  Assessment of understanding of anemia/bleeding disorder diagnosis Basic overview and discussion of anemia/bleeding disorder or acute disease state Review  of most recent labs:  Lab Results  Component Value Date   WBC 6.0 08/20/2021   HGB 12.3 08/20/2021   HCT 36.5 08/20/2021   MCV 86.9 08/20/2021   PLT 405 (H)  08/20/2021     Lab Results  Component Value Date   IRON 140 08/20/2021   TIBC 479 (H) 08/20/2021   FERRITIN 173 08/20/2021     Medications reviewed - Counseled on importance of regular laboratory monitoring as directed by provider; - Advised to call provider or 911 if active bleeding or signs and symptoms of active bleeding occur; - recommended promotion of rest and energy-conserving measures to manage fatigue, such as balancing activity with periods of rest - encouraged dietary changes to increase dietary intake of iron, Vitamin I78 and folic acid as advised/prescribed - Assessed social determinant of health barriers;  Patient reports irregular vaginal bleeding since decreasing Aygestin to two tablets daily, changing pad 3 times daily Advised patient to report heavy bleeding( soaking a pad in an hour) to her provider Advised patient to keep track of her bleeding on a calendar or journal and share this with her provider Encouraged patient to continue eating iron rich foods, discussed examples   Anxiety & Depression  (Status:  Goal on track:  Yes.)  Long Term Goal Evaluation of current treatment plan related to Anxiety and Depression, Lacks knowledge of community resource: Dental & Vision resources,  self-management and patient's adherence to plan as established by provider. Discussed plans with patient for ongoing care management follow up and provided patient with direct contact information for care management team Evaluation of current treatment plan related to Anxiety & Depression and patient's adherence to plan as established by provider Advised patient to attend visit with Behavioral Health clinician on 12/18/21 Reviewed medications with patient and discussed use of antidepressant Reviewed scheduled/upcoming provider appointments including Screening for signs and symptoms of depression related to chronic disease state   Provieded therapeutic listening  Patient Goals/Self-Care  Activities: Take medications as prescribed   Attend all scheduled provider appointments Call pharmacy for medication refills 3-7 days in advance of running out of medications Call provider office for new concerns or questions  Work with the social worker to address care coordination needs and will continue to work with the clinical team to address health care and disease management related needs

## 2021-12-16 NOTE — Telephone Encounter (Signed)
-----   Message from Melissa Montane, RN sent at 12/16/2021 10:14 AM EDT ----- Regarding: BP cuff Hi Dr. [name],   Can you send an order for BP cuff to [facility]? This patient recently started on Norvasc and would like to monitor her BP at home. Thank you  Lurena Joiner RN, BSN [place] RN Care Coordinator

## 2021-12-18 ENCOUNTER — Ambulatory Visit (INDEPENDENT_AMBULATORY_CARE_PROVIDER_SITE_OTHER): Payer: Medicaid Other | Admitting: Clinical

## 2021-12-18 DIAGNOSIS — F4323 Adjustment disorder with mixed anxiety and depressed mood: Secondary | ICD-10-CM | POA: Diagnosis not present

## 2021-12-18 NOTE — Patient Instructions (Signed)
[facility] [address] [place], [place] [address] [phone] (main office) [phone] (Naseer Hearn's office)   

## 2021-12-23 NOTE — BH Specialist Note (Deleted)
Integrated Behavioral Health via Telemedicine Visit  12/23/2021 DHRUTI GHUMAN [id number]  Number of [place] Clinician visits: 1- Initial Visit  Session Start time: [phone]   Session End time: 0938  Total time in minutes: 46   Referring Provider: *** Patient/Family location: [facility] Provider location: *** All persons participating in visit: *** Types of Service: {CHL AMB TYPE OF SERVICE:[phone]}  I connected with [name] and/or Kristeen Miss D Yon's {family members:20773} via  Telephone or Video Enabled Telemedicine Application  (Video is Caregility application) and verified that I am speaking with the correct person using two identifiers. Discussed confidentiality: {YES/NO:21197}  I discussed the limitations of telemedicine and the availability of in person appointments.  Discussed there is a possibility of technology failure and discussed alternative modes of communication if that failure occurs.  I discussed that engaging in this telemedicine visit, they consent to the provision of behavioral healthcare and the services will be billed under their insurance.  Patient and/or legal guardian expressed understanding and consented to Telemedicine visit: {YES/NO:21197}  Presenting Concerns: Patient and/or family reports the following symptoms/concerns: *** Duration of problem: ***; Severity of problem: {Mild/Moderate/Severe:20260}  Patient and/or Family's Strengths/Protective Factors: {CHL AMB BH PROTECTIVE FACTORS:[phone]}  Goals Addressed: Patient will:  Reduce symptoms of: {IBH Symptoms:21014056}   Increase knowledge and/or ability of: {IBH Patient Tools:21014057}   Demonstrate ability to: {IBH Goals:21014053}  Progress towards Goals: {CHL AMB BH PROGRESS TOWARDS GOALS:[phone]}  Interventions: Interventions utilized:  {IBH Interventions:21014054} Standardized Assessments completed: {IBH Screening Tools:21014051}  Patient and/or Family  Response: ***  Assessment: Patient currently experiencing ***.   Patient may benefit from ***.  Plan: Follow up with behavioral health clinician on : *** Behavioral recommendations: *** Referral(s): {IBH Referrals:21014055}  I discussed the assessment and treatment plan with the patient and/or parent/guardian. They were provided an opportunity to ask questions and all were answered. They agreed with the plan and demonstrated an understanding of the instructions.   They were advised to call back or seek an in-person evaluation if the symptoms worsen or if the condition fails to improve as anticipated.  Caroleen Hamman Jovita Persing, LCSW

## 2022-01-02 ENCOUNTER — Other Ambulatory Visit: Payer: Self-pay

## 2022-01-02 NOTE — Patient Outreach (Signed)
Medicaid Managed Care Social Work Note  01/02/2022 Name:  Leah Baker MRN:  [id number] [name]:  09-Feb-1981  Leah Baker is an 41 y.o. year old female who is a primary patient of Leah Gaskins Burnett Harry, MD.  The Medicaid Managed Care Coordination team was consulted for assistance with:  Community Resources   Leah Baker was given information about Medicaid Managed Care Coordination team services today. Leah Baker Patient agreed to services and verbal consent obtained.  Engaged with patient  for by telephone forfollow up visit in response to referral for case management and/or care coordination services.   Assessments/Interventions:  Review of past medical history, allergies, medications, health status, including review of consultants reports, laboratory and other test data, was performed as part of comprehensive evaluation and provision of chronic care management services.  SDOH: (Social Determinant of Health) assessments and interventions performed: Leah Baker completed a telephone outreach with patient. She stated she is doing okay, she did have surgury on 7/11 and is still experiencing some bleeding, she is in contact with the doctor to continue to follow up. She states no resources are needed at this time. Leah Baker will continue to follow up.   Advanced Directives Status:  Not addressed in this encounter.  Care Plan                 Allergies  Allergen Reactions   Latex Itching and Rash    Medications Reviewed Today     Reviewed by Leah Montane, RN (Registered Nurse) on 12/16/21 at [phone]  Med List Status: <None>   Medication Order Taking? Sig Documenting Provider Last Dose Status Informant  acetaminophen (TYLENOL) 500 MG tablet [id number] Yes Take 1 tablet (500 mg total) by mouth every 6 (six) hours as needed. Leah Picket, MD Taking Active Self  amLODipine (NORVASC) 5 MG tablet [id number] Yes Take 1 tablet (5 mg total) by mouth daily. Leah Stain, MD Taking Active   Ferrous  Sulfate (IRON) 325 (65 Fe) MG TABS [id number] Yes Take by mouth in the morning and at bedtime. [provider] Taking Active Self           Med Note [facility] Baker, Leah A   Wed Jul 24, 2021  9:37 AM) Taking 3 times a day  fluticasone (FLONASE) 50 MCG/ACT nasal spray [id number] Yes Place 2 sprays into both nostrils daily. Leah Stain, MD Taking Active   hydrocortisone (ANUSOL-HC) 2.5 % rectal cream [id number] Yes Place 1 application. rectally 2 (two) times daily. Leah Stain, MD Taking Active   Menthol-Camphor (TIGER [facility] ARTHRITIS RUB EX) [id number] Yes Apply 1 application topically daily as needed (knee pain). [provider] Taking Active Self  norethindrone (AYGESTIN) 5 MG tablet [id number] Yes Take 2 tablets (10 mg total) by mouth daily. [name], MD Taking Active   oxybutynin (DITROPAN) 5 MG tablet [id number] Yes Take 1 tablet (5 mg total) by mouth 3 (three) times daily. Leah Stain, MD Taking Active   psyllium (METAMUCIL) 58.6 % packet [id number] Yes Take 1 packet by mouth daily. [provider] Taking Active   sertraline (ZOLOFT) 100 MG tablet [id number] Yes Take 1 tablet (100 mg total) by mouth at bedtime. 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   Follow up:  Patient agrees to Care Plan and Follow-up.  Plan: The Managed Medicaid care management team will reach out to the patient again over the next 30-45 days.  Date/time of next scheduled Social Work care management/care coordination outreach:  02/07/22  Leah Baker, Leah Baker, Leah Baker Managed Medicaid Team  [phone]

## 2022-01-02 NOTE — Patient Instructions (Signed)
Visit Information  Leah Baker was given information about Medicaid Managed Care team care coordination services as a part of their [place] Medicaid benefit. Leah Baker verbally consented to engagement with the [facility] Managed Care team.   If you are experiencing a medical emergency, please call 911 or report to your local emergency department or urgent care.   If you have a non-emergency medical problem during routine business hours, please contact your provider's office and ask to speak with a nurse.   For questions related to your [facility], please call: [phone] or visit the homepage here: [email or web address]  If you would like to schedule transportation through your [facility], please call the following number at least 2 days in advance of your appointment: [phone]   Rides for urgent appointments can also be made after hours by calling Member Services.  Call the [place] at [phone], at any time, 24 hours a day, 7 days a week. If you are in danger or need immediate medical attention call 911.  If you would like help to quit smoking, call 1-800-QUIT-NOW 2493404208) OR Espaol: 1-855-Djelo-Ya (6-160-737-1062) o para ms informacin haga clic aqu or Text READY to 200-400 to register via text  Leah Baker - following are the goals we discussed in your visit today:   Goals Addressed   None       Social Worker will follow up in 30-45 days .   Mickel Fuchs, BSW, [place] Managed Medicaid Team  [phone]   Following is a copy of your plan of care:  There are no care plans that you recently modified to display for this patient.

## 2022-01-03 ENCOUNTER — Telehealth: Payer: Self-pay

## 2022-01-03 NOTE — Patient Instructions (Signed)
Visit Information  [name]  - as a part of your Medicaid benefit, you are eligible for care management and care coordination services at no cost or copay. I was unable to reach you by phone today but would be happy to help you with your health related needs. Please feel free to call me @ [phone]).     Mickel Fuchs, BSW, [place] Managed Medicaid Team  [phone]

## 2022-01-03 NOTE — Patient Outreach (Signed)
Care Coordination  01/03/2022  [name] 02/24/1981 [id number]   Medicaid Managed Care   Unsuccessful Outreach Note  01/03/2022 Name: Leah Baker MRN: [id number] [name]: 07/28/1980  Referred by: Elsie Stain, MD Reason for referral : High Risk Managed Medicaid (MM Social Work [employer])   An unsuccessful telephone outreach was attempted today. The patient was referred to the case management team for assistance with care management and care coordination.   Follow Up Plan: A HIPAA compliant phone message was left for the patient providing contact information and requesting a return call.   Mickel Fuchs, BSW, [place] Managed Medicaid Team  [phone]

## 2022-01-07 NOTE — BH Specialist Note (Signed)
Integrated Behavioral Health via Telemedicine Visit  01/21/2022 TULANI KIDNEY [id number]  Number of [place] Clinician visits: 2- Second Visit  Session Start time: [phone]   Session End time: 5462  Total time in minutes: 37   Referring Provider: Darron Doom, MD Patient/Family location: Home [facility] Provider location: [facility] [place] [facility]  All persons participating in visit: [name] and [place]   Types of Service: Individual psychotherapy and Video visit  I connected with [name] and/or Herbert Spires Woolworth's  n/a  via  Telephone or Video Enabled Telemedicine Application  (Video is Caregility application) and verified that I am speaking with the correct person using two identifiers. Discussed confidentiality: Yes   I discussed the limitations of telemedicine and the availability of in person appointments.  Discussed there is a possibility of technology failure and discussed alternative modes of communication if that failure occurs.  I discussed that engaging in this telemedicine visit, they consent to the provision of behavioral healthcare and the services will be billed under their insurance.  Patient and/or legal guardian expressed understanding and consented to Telemedicine visit: Yes   Presenting Concerns: Patient and/or family reports the following symptoms/concerns: Anxious over finding out results after upcoming appointments. Duration of problem: Increasing with health concerns; Severity of problem: moderate  Patient and/or Family's Strengths/Protective Factors: Concrete supports in place (healthy food, safe environments, etc.) and Sense of purpose  Goals Addressed: Patient will:  Reduce symptoms of: anxiety, depression, insomnia, and stress    Demonstrate ability to: Increase healthy adjustment to current life circumstances  Progress towards  Goals: Ongoing  Interventions: Interventions utilized:  Supportive Reflection Standardized Assessments completed: GAD-7 and PHQ 9  Patient and/or Family Response: Patient agrees with treatment plan.   Assessment: Patient currently experiencing Adjustment disorder with mixed anxious and depressed mood.   Patient may benefit from continued therapeutic interventions.  Plan: Follow up with behavioral health clinician on : One month Behavioral recommendations:  -Continue taking Zoloft as prescribed -Continue using coping strategies that have remained helpful; obtain notebook/journal, as discussed Referral(s): [place] (In Clinic)  I discussed the assessment and treatment plan with the patient and/or parent/guardian. They were provided an opportunity to ask questions and all were answered. They agreed with the plan and demonstrated an understanding of the instructions.   They were advised to call back or seek an in-person evaluation if the symptoms worsen or if the condition fails to improve as anticipated.  Garlan Fair, LCSW     01/21/2022   10:00 AM 11/21/2021    9:40 AM 09/17/2021    8:51 AM 07/24/2021   10:28 AM 07/24/2021    9:52 AM  Depression screen PHQ 2/9  Decreased Interest '1  2  2  '$ Down, Depressed, Hopeless '1 3 3  2  '$ PHQ - 2 Score '2 3 5  4  '$ Altered sleeping '2 3 2    '$ Tired, decreased energy '1 2 3  2  '$ Change in appetite  '2 2  3  '$ Feeling bad or failure about yourself  '1 2 1  1  '$ Trouble concentrating '1 2 1  2  '$ Moving slowly or fidgety/restless 1 0 0 0   Suicidal thoughts 0 0 0 0   PHQ-9 Score '8 14 14    '$ Difficult doing work/chores    Somewhat difficult       01/21/2022   10:02 AM 11/21/2021    9:41  AM 09/17/2021    8:51 AM 05/02/2021    8:40 AM  GAD 7 : Generalized Anxiety Score  Nervous, Anxious, on Edge '1 3 3 3  '$ Control/stop worrying '1 3 3 2  '$ Worry too much - different things '1 3 3 2  '$ Trouble relaxing '2 3 3 3  '$ Restless 0 '1 2 2   '$ Easily annoyed or irritable '1 1 1 1  '$ Afraid - awful might happen '1 3 3 3  '$ Total GAD 7 Score '7 17 18 '$ 16

## 2022-01-21 ENCOUNTER — Ambulatory Visit (INDEPENDENT_AMBULATORY_CARE_PROVIDER_SITE_OTHER): Payer: Medicaid Other | Admitting: Clinical

## 2022-01-21 DIAGNOSIS — F4323 Adjustment disorder with mixed anxiety and depressed mood: Secondary | ICD-10-CM | POA: Diagnosis not present

## 2022-01-21 NOTE — Patient Instructions (Signed)
[facility] [address] [place], [place] [address] [phone] (main office) [phone] (Cadence Minton's office)   

## 2022-01-22 ENCOUNTER — Encounter: Payer: Self-pay | Admitting: Critical Care Medicine

## 2022-01-22 ENCOUNTER — Ambulatory Visit: Payer: Medicaid Other | Attending: Critical Care Medicine | Admitting: Critical Care Medicine

## 2022-01-22 VITALS — BP 132/92 | HR 97 | Ht 63.0 in | Wt 165.8 lb

## 2022-01-22 DIAGNOSIS — M25562 Pain in left knee: Secondary | ICD-10-CM

## 2022-01-22 DIAGNOSIS — G8929 Other chronic pain: Secondary | ICD-10-CM

## 2022-01-22 DIAGNOSIS — I10 Essential (primary) hypertension: Secondary | ICD-10-CM | POA: Diagnosis not present

## 2022-01-22 DIAGNOSIS — N921 Excessive and frequent menstruation with irregular cycle: Secondary | ICD-10-CM

## 2022-01-22 DIAGNOSIS — K625 Hemorrhage of anus and rectum: Secondary | ICD-10-CM

## 2022-01-22 DIAGNOSIS — F39 Unspecified mood [affective] disorder: Secondary | ICD-10-CM | POA: Diagnosis not present

## 2022-01-22 MED ORDER — AMLODIPINE BESYLATE 10 MG PO TABS: 10.0000 mg | ORAL_TABLET | Freq: Every day | ORAL | 2 refills | Status: DC

## 2022-01-22 MED ORDER — BLOOD PRESSURE KIT DEVI: 0 refills | Status: DC

## 2022-01-22 MED ORDER — DICLOFENAC SODIUM 1 % EX GEL: 2.0000 g | Freq: Four times a day (QID) | CUTANEOUS | 2 refills | Status: AC

## 2022-01-22 NOTE — Assessment & Plan Note (Signed)
Continue sertraline 

## 2022-01-22 NOTE — Assessment & Plan Note (Signed)
Hypertension without control We will need to begin amlodipine 10 mg daily Patient given a blood pressure meter at this visit

## 2022-01-22 NOTE — Patient Instructions (Signed)
Referral to Dr. Fredonia Highland of orthopedics was made for your left knee  Use Voltaren gel 4 times daily to the left knee continue to ice the back of the knee  Follow-up with your general surgeon and your gynecologist regarding due to your bleeding issues  A blood pressure meter was sent to the [facility] please pick that up and measure twice daily and write down the numbers bring [place] those numbers back at the next visit  An appointment with Lurena Joiner our clinical pharmacist will be made in the next 4 weeks for blood pressure follow-up  See Dr. [name] in 4 months  Increase dose of amlodipine to 10 mg daily take 2 of the 5 mg daily tablets but then on the refill will be a single 10 mg daily tablet

## 2022-01-22 NOTE — Progress Notes (Addendum)
Established Patient Office Visit  Subjective:  Patient ID: Leah Baker, female    [name]: Jan 06, 1981  Age: 41 y.o. MRN: [id number]  CC:  PCP f/u ;    HPI 12/2 Leah Baker presents for primary care follow-up.  Note patient was given a course of Augmentin for left ear pain by telephone visit a week ago.  Today she states her symptoms are improved.  She still has decreased hearing in the left ear but her ear is popping open when she applies positive pressure.  On arrival blood pressures 130/89.  Patient has had follow-up with hematology and they did not feel her adenopathy is from malignancy.  Patient is on iron infusions for iron deficiency anemia due to menorrhagia and rectal bleeding from internal hemorrhoids.  Patient would like a second opinion referral for her hemorrhoids.  Patient has no other complaints  09/17/2021 Patient returns in follow-up and complains of progressive alopecia while being on progesterone therapy for her menorrhagia.  She had an ablation procedure that did not successfully reduce the bleeding.  She has follow-up appointment with gynecology in June.  Another issue she continues to have rectal bleeding and has no real follow-up with surgery at this time.  She saw hematology below is documentation from the hematologic visit.  She is not iron deficient at this time she is taking iron supplements and her hemoglobin was 12.  She still having rectal bleeding and is using Anusol rectal cream.  She has more internal hemorrhoids and may need a banding procedure.  General surgery had not wish to intervene until the lymph node was further assessed.  Lymph node biopsy has already been performed and showed lymphoid hyperplasia no malignancy.  As per last hematology assessment as below in March: 1) Rt inguiinal and iliac lymphadenopathy -- reactive lymphoid hyperplasia 2) Patient reported possible Hemochromatosis ?carrier state 3) Iron deficiency anemia likely from  hemorrhoidal bleeding 4) Thrombocytosis - likely reactive from iron deficiency. PLAN: -Discussed patient's labwork today, 08/20/2021  CBC shows normal hemoglobin of 12.3 with normal WBC count and platelets of 405k CMP unremarkable Ferritin of 173 with an iron saturation of 29%. -No indication for additional IV iron replacement at this time. -Continue p.o. iron polysaccharide 150 mg p.o. daily -Follow-up with OB/GYN for continued management of menorrhagia.  Patient is being considered for endometrial ablation and is currently on progesterone. -Continue follow-up with gastroenterology Dr. Alessandra Bevels for management of GI bleeding/possible consideration of banding of hemorrhoids.    Patient does complain of some tooth pain.  Also having stress and anxiety does have follow-up plans for her mental health provider.  She is on the sertraline 100 mg daily.  8/23 Patient returns in 77-monthfollow-up.  She is complaining of left knee pain which is towards the back of the knee notes she has had an ACL left meniscus repair in the past with orthopedics with [facility]  Patient also still bleeding heavily with menstrual periods worse since reducing the dose of her progesterone.  Patient had had a Pap smear which was normal and was seen by gynecology they wanted to do an ultrasound but she wanted to hold off.  Patient also notes some rectal bleeding still despite having hemorrhoidal banding of internal hemorrhoids.  She has follow-up with general surgery plan.  On arrival blood pressure 132/92 remains elevated and she does not yet have a blood pressure meter  Past Medical History:  Diagnosis Date   Abnormal uterine bleeding (AUB)  Anemia    Phreesia 06/08/2020   Anxiety    Blood in stool last 2 years   Depression    Depression    Phreesia 06/08/2020   Hemorrhoids    History of blood transfusion 05/2019 2 units   iron transfusion given also   History of lower GI bleeding dec 2021 active problem    s/p  colonoscopy 09-01-2018,  hemorrhoids   IDA (iron deficiency anemia)    Inguinal lymphadenopathy    CT 09-14-2018  right side, followed by pcp   PONV (postoperative nausea and vomiting)    only happened once   Wears glasses    Wears glasses     Past Surgical History:  Procedure Laterality Date   BIOPSY  09/01/2018   Procedure: BIOPSY;  Surgeon: Otis Brace, MD;  Location: [place];  Service: Gastroenterology;;   CESAREAN SECTION  01/15/2002   _0    CESAREAN SECTION  03/17/2011   Procedure: CESAREAN SECTION;  Surgeon: Osborne Oman, MD;  Location: [facility];  Service: Gynecology;  Laterality: N/A;   COLONOSCOPY WITH PROPOFOL N/A 09/01/2018   Procedure: COLONOSCOPY WITH PROPOFOL;  Surgeon: Otis Brace, MD;  Location: [place];  Service: Gastroenterology;  Laterality: N/A;   DILITATION & CURRETTAGE/HYSTROSCOPY WITH HYDROTHERMAL ABLATION N/A 08/22/2020   Procedure: DILATATION & CURETTAGE/HYSTEROSCOPY CERVICAL REPAIR;  Surgeon: [name], MD;  Location: [place];  Service: Gynecology;  Laterality: N/A;   HYSTEROSCOPY WITH D & C  06-21-2009  dr constant _1    w/  REMOVAL IUD    INGUINAL LYMPH NODE BIOPSY Right 04/12/2020   Procedure: RIGHT INGUINAL LYMPH NODE EXCISIONAL BIOPSY;  Surgeon: Stark Klein, MD;  Location: [place];  Service: General;  Laterality: Right;   IUD REMOVAL  06/21/2009   KNEE ARTHROSCOPY WITH ANTERIOR CRUCIATE LIGAMENT (ACL) REPAIR Left 09-08-2013   dr [name]  _2    w/  meniscal repair   MULTIPLE TOOTH EXTRACTIONS  2012   RECTAL EXAM UNDER ANESTHESIA N/A 11/18/2018   Procedure: ANORECTAL EXAM UNDER ANESTHESIA;  Surgeon: Ileana Roup, MD;  Location: [place];  Service: General;  Laterality: N/A;   TUBAL LIGATION Bilateral 2012    Family History  Problem Relation Age of Onset   Cancer Mother    Kidney failure Mother    Healthy Father     Social History   Socioeconomic History   Marital status:  Significant Other    Spouse name: Not on file   Number of children: Not on file   Years of education: Not on file   Highest education level: Not on file  Occupational History   Not on file  Tobacco Use   Smoking status: Former    Years: 5.00    Types: Cigarettes    Quit date: 08/22/2008    Years since quitting: 13.4   Smokeless tobacco: Never  Vaping Use   Vaping Use: Never used  Substance and Sexual Activity   Alcohol use: Not Currently   Drug use: Not Currently    Comment: per pt younger in college marijuana, none since   Sexual activity: Yes    Birth control/protection: Surgical  Other Topics Concern   Not on file  Social History Narrative   Not on file   Social Determinants of Health   Financial Resource Strain: Low Risk  (07/24/2021)   Overall Financial Resource Strain (CARDIA)    Difficulty of Paying Living Expenses: Not hard at all  Food Insecurity: Food Insecurity Present (11/21/2021)  Hunger Vital Sign    Worried About Running Out of Food in the Last Year: Sometimes true    Ran Out of Food in the Last Year: Sometimes true  Transportation Needs: No Transportation Needs (11/21/2021)   PRAPARE - Hydrologist (Medical): No    Lack of Transportation (Non-Medical): No  Physical Activity: Insufficiently Active (07/24/2021)   Exercise Vital Sign    Days of Exercise per Week: 2 days    Minutes of Exercise per Session: 20 min  Stress: Stress Concern Present (07/24/2021)   [employer]    Feeling of Stress : Very much  Social Connections: Moderately Integrated (07/24/2021)   Social Connection and Isolation Panel [NHANES]    Frequency of Communication with Friends and Family: More than three times a week    Frequency of Social Gatherings with Friends and Family: More than three times a week    Attends Religious Services: More than 4 times per year    Active Member of [employer] or  Organizations: No    Attends Archivist Meetings: Never    Marital Status: Living with partner  Intimate Partner Violence: Not on file    Outpatient Medications Prior to Visit  Medication Sig Dispense Refill   acetaminophen (TYLENOL) 500 MG tablet Take 1 tablet (500 mg total) by mouth every 6 (six) hours as needed. 30 tablet 0   Ferrous Sulfate (IRON) 325 (65 Fe) MG TABS Take by mouth in the morning and at bedtime.     fluticasone (FLONASE) 50 MCG/ACT nasal spray Place 2 sprays into both nostrils daily. 16 g 6   hydrocortisone (ANUSOL-HC) 2.5 % rectal cream Place 1 application. rectally 2 (two) times daily. 30 g 2   Menthol-Camphor (TIGER BALM ARTHRITIS RUB EX) Apply 1 application topically daily as needed (knee pain).     norethindrone (AYGESTIN) 5 MG tablet Take 2 tablets (10 mg total) by mouth daily. 90 tablet 3   oxybutynin (DITROPAN) 5 MG tablet Take 1 tablet (5 mg total) by mouth 3 (three) times daily. 60 tablet 1   psyllium (METAMUCIL) 58.6 % packet Take 1 packet by mouth daily.     sertraline (ZOLOFT) 100 MG tablet Take 1 tablet (100 mg total) by mouth at bedtime. 60 tablet 3   amLODipine (NORVASC) 5 MG tablet Take 1 tablet (5 mg total) by mouth daily. 60 tablet 1   Blood Pressure Monitoring (BLOOD PRESSURE KIT) DEVI Use to measure blood pressure 1 each 0   Facility-Administered Medications Prior to Visit  Medication Dose Route Frequency Provider Last Rate Last Admin   bupivacaine liposome (EXPAREL) 1.3 % injection 266 mg  20 mL Infiltration Once Stark Klein, MD        Allergies  Allergen Reactions   Latex Itching and Rash    ROS Review of Systems  Constitutional: Negative.  Negative for chills, diaphoresis and fever.  HENT:  Negative for congestion, ear pain, hearing loss, nosebleeds, postnasal drip, rhinorrhea, sinus pressure, sore throat, tinnitus, trouble swallowing and voice change.   Eyes: Negative.  Negative for photophobia and redness.  Respiratory:  Negative.  Negative for apnea, cough, choking, chest tightness, shortness of breath, wheezing and stridor.   Cardiovascular: Negative.  Negative for chest pain, palpitations and leg swelling.  Gastrointestinal:  Positive for abdominal pain and blood in stool. Negative for abdominal distention, constipation, diarrhea, nausea and vomiting.  Endocrine: Negative for polydipsia.  Genitourinary:  Positive for  menstrual problem and vaginal bleeding. Negative for dysuria, flank pain, frequency, hematuria and urgency.  Musculoskeletal:  Negative for arthralgias, back pain, myalgias and neck pain.       Left knee pain  Skin: Negative.  Negative for rash.       Alopecia with female pattern baldness  Allergic/Immunologic: Negative.  Negative for environmental allergies and food allergies.  Neurological: Negative.  Negative for dizziness, tremors, seizures, syncope, weakness and headaches.  Hematological: Negative.  Negative for adenopathy. Does not bruise/bleed easily.  Psychiatric/Behavioral:  Positive for dysphoric mood. Negative for agitation, self-injury, sleep disturbance and suicidal ideas. The patient is not nervous/anxious.       Objective:    Physical Exam Vitals reviewed.  Constitutional:      Appearance: Normal appearance. She is well-developed. She is not diaphoretic.  HENT:     Head: Normocephalic and atraumatic.     Right Ear: Tympanic membrane, ear canal and external ear normal. There is no impacted cerumen.     Left Ear: Tympanic membrane, ear canal and external ear normal. There is no impacted cerumen.     Nose: No nasal deformity, septal deviation, mucosal edema or rhinorrhea.     Right Sinus: No maxillary sinus tenderness or frontal sinus tenderness.     Left Sinus: No maxillary sinus tenderness or frontal sinus tenderness.     Mouth/Throat:     Pharynx: No oropharyngeal exudate.     Comments: Multiple carious teeth Eyes:     General: No scleral icterus.    Conjunctiva/sclera:  Conjunctivae normal.     Pupils: Pupils are equal, round, and reactive to light.  Neck:     Thyroid: No thyromegaly.     Vascular: No carotid bruit or JVD.     Trachea: Trachea normal. No tracheal tenderness or tracheal deviation.  Cardiovascular:     Rate and Rhythm: Normal rate and regular rhythm.     Chest Wall: PMI is not displaced.     Pulses: Normal pulses. No decreased pulses.     Heart sounds: Normal heart sounds, S1 normal and S2 normal. Heart sounds not distant. No murmur heard.    No systolic murmur is present.     No diastolic murmur is present.     No friction rub. No gallop. No S3 or S4 sounds.  Pulmonary:     Effort: No tachypnea, accessory muscle usage or respiratory distress.     Breath sounds: No stridor. No decreased breath sounds, wheezing, rhonchi or rales.  Chest:     Chest wall: No tenderness.  Abdominal:     General: Bowel sounds are normal. There is no distension.     Palpations: Abdomen is soft. Abdomen is not rigid.     Tenderness: There is abdominal tenderness. There is no guarding or rebound.     Comments: Tender left lower quadrant and pelvic area no rebound or guarding no masses  Musculoskeletal:        General: Tenderness present. Normal range of motion.     Cervical back: Normal range of motion and neck supple. No edema, erythema or rigidity. No muscular tenderness. Normal range of motion.     Comments: Tender posteriorly behind the left knee full range of motion of the knee no tenderness anteriorly or in the patellar compartment  Lymphadenopathy:     Head:     Right side of head: No submental or submandibular adenopathy.     Left side of head: No submental or submandibular adenopathy.  Cervical: No cervical adenopathy.  Skin:    General: Skin is warm and dry.     Coloration: Skin is not pale.     Findings: No rash.     Nails: There is no clubbing.     Comments: Female pattern baldness no evidence of scalp psoriasis or eczema  Neurological:      Mental Status: She is alert and oriented to person, place, and time.     Sensory: No sensory deficit.  Psychiatric:        Speech: Speech normal.        Behavior: Behavior normal.     BP (!) 132/92   Pulse 97   Ht _0  (1.6 m)   Wt 165 lb 12.8 oz (75.2 kg)   LMP 01/17/2022   SpO2 99%   BMI 29.37 kg/m  Wt Readings from Last 3 Encounters:  01/22/22 165 lb 12.8 oz (75.2 kg)  11/21/21 164 lb 9.6 oz (74.7 kg)  09/17/21 166 lb 3.2 oz (75.4 kg)     There are no preventive care reminders to display for this patient.    There are no preventive care reminders to display for this patient.  Lab Results  Component Value Date   TSH 1.070 03/28/2020   Lab Results  Component Value Date   WBC 6.0 08/20/2021   HGB 12.3 08/20/2021   HCT 36.5 08/20/2021   MCV 86.9 08/20/2021   PLT 405 (H) 08/20/2021   Lab Results  Component Value Date   NA 139 08/20/2021   K 3.6 08/20/2021   CO2 23 08/20/2021   GLUCOSE 99 08/20/2021   BUN 15 08/20/2021   CREATININE 0.88 08/20/2021   BILITOT 0.4 08/20/2021   ALKPHOS 40 08/20/2021   AST 18 08/20/2021   ALT 13 08/20/2021   PROT 7.6 08/20/2021   ALBUMIN 4.5 08/20/2021   CALCIUM 9.7 08/20/2021   ANIONGAP 7 08/20/2021   EGFR 72 07/30/2020   No results found for: "CHOL" No results found for: "HDL" No results found for: "LDLCALC" No results found for: "TRIG" No results found for: "CHOLHDL" No results found for: "HGBA1C"    Assessment & Plan:   Problem List Items Addressed This Visit       Cardiovascular and Mediastinum   Primary hypertension    Hypertension without control We will need to begin amlodipine 10 mg daily Patient given a blood pressure meter at this visit      Relevant Medications   amLODipine (NORVASC) 10 MG tablet     Digestive   Rectal bleeding    Rectal bleeding continues despite hemorrhoidal banding follow-up with general surgery as planned        Other   Chronic pain of left knee - Primary    Chronic  pain of the left knee posteriorly will refer back to orthopedics and apply topical Voltaren gel for now      Relevant Orders   Ambulatory referral to Orthopedic Surgery   Mood disorder ([place])    Continue sertraline      Menorrhagia    And follow-up plan to follow-up with gynecology      Meds ordered this encounter  Medications   Blood Pressure Monitoring (BLOOD PRESSURE KIT) DEVI    Sig: Use to measure blood pressure    Dispense:  1 each    Refill:  0   amLODipine (NORVASC) 10 MG tablet    Sig: Take 1 tablet (10 mg total) by mouth daily.    Dispense:  90 tablet    Refill:  2   diclofenac Sodium (VOLTAREN) 1 % GEL    Sig: Apply 2 g topically 4 (four) times daily.    Dispense:  50 g    Refill:  2    Follow-up: Return in about 4 months (around 05/24/2022) for htn.    Asencion Noble, MD

## 2022-01-22 NOTE — Assessment & Plan Note (Signed)
Rectal bleeding continues despite hemorrhoidal banding follow-up with general surgery as planned

## 2022-01-22 NOTE — Assessment & Plan Note (Addendum)
And follow-up plan to follow-up with gynecology

## 2022-01-22 NOTE — Progress Notes (Signed)
Pt states that she hasn't received Bp cuff.

## 2022-01-22 NOTE — Assessment & Plan Note (Signed)
Chronic pain of the left knee posteriorly will refer back to orthopedics and apply topical Voltaren gel for now

## 2022-01-28 DIAGNOSIS — M25562 Pain in left knee: Secondary | ICD-10-CM | POA: Diagnosis not present

## 2022-02-04 ENCOUNTER — Encounter: Payer: Self-pay | Admitting: Critical Care Medicine

## 2022-02-04 NOTE — Telephone Encounter (Signed)
Lurena Joiner can you see this pt some time soon this month for her uncontrolled blood pressure?
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
## 2022-02-07 ENCOUNTER — Other Ambulatory Visit: Payer: Self-pay

## 2022-02-07 NOTE — Patient Outreach (Signed)
Medicaid Managed Care Social Work Note  02/07/2022 Name:  Leah Baker MRN:  [id number] [name]:  [birth year]  Leah Baker is an 41 y.o. year old female who is a primary patient of [name] Burnett Harry, MD.  The Medicaid Managed Care Coordination team was consulted for assistance with:  Community Resources   Leah Baker was given information about Medicaid Managed Care Coordination team services today. [name] Patient agreed to services and verbal consent obtained.  Engaged with patient  for by telephone forfollow up visit in response to referral for case management and/or care coordination services.   Assessments/Interventions:  Review of past medical history, allergies, medications, health status, including review of consultants reports, laboratory and other test data, was performed as part of comprehensive evaluation and provision of chronic care management services.  SDOH: (Social Determinant of Health) assessments and interventions performed: SDOH Interventions    Flowsheet Row Patient Outreach Telephone from 07/24/2021 in [place] Coordination Office Visit from 05/02/2021 in [place] Office Visit from 07/30/2020 in [place] Interventions Intervention Not Indicated -- --  Housing Interventions Intervention Not Indicated -- --  Transportation Interventions Intervention Not Indicated -- --  Depression Interventions/Treatment  Medication Medication, Currently on Treatment Counseling  Financial Strain Interventions Intervention Not Indicated -- --  Physical Activity Interventions Intervention Not Indicated -- --  Stress Interventions Intervention Not Indicated -- --  Social Connections Interventions Intervention Not Indicated -- --     BSW completed a telephone outreach with patient. She stated she did receive her blood pressure cuff, she has started  back seeing someone at behavioral health. She is now on 2 blood pressure pills at night. No resources are needed at this time.  Advanced Directives Status:  Not addressed in this encounter.  Care Plan                 Allergies  Allergen Reactions   Latex Itching and Rash    Medications Reviewed Today     Reviewed by Elsie Stain, MD (Physician) on 01/22/22 at [phone]  Med List Status: <None>   Medication Order Taking? Sig Documenting Provider Last Dose Status Informant  acetaminophen (TYLENOL) 500 MG tablet [id number] Yes Take 1 tablet (500 mg total) by mouth every 6 (six) hours as needed. Chase Picket, MD Taking Active Self  amLODipine (NORVASC) 5 MG tablet [id number] Yes Take 1 tablet (5 mg total) by mouth daily. Elsie Stain, MD Taking Active   Blood Pressure Monitoring (BLOOD PRESSURE KIT) DEVI [id number] Yes Use to measure blood pressure Elsie Stain, MD  Active   Ferrous Sulfate (IRON) 325 (65 Fe) MG TABS [id number] Yes Take by mouth in the morning and at bedtime. [provider] Taking Active Self           Med Note [facility], [name]   Wed Jul 24, 2021  9:37 AM) Taking 3 times a day  fluticasone (FLONASE) 50 MCG/ACT nasal spray [id number] Yes Place 2 sprays into both nostrils daily. Elsie Stain, MD Taking Active   hydrocortisone (ANUSOL-HC) 2.5 % rectal cream [id number] Yes Place 1 application. rectally 2 (two) times daily. Elsie Stain, MD Taking Active   Menthol-Camphor (TIGER [facility] ARTHRITIS RUB EX) [id number] Yes Apply 1 application topically daily as needed (knee pain). [provider] Taking Active Self  norethindrone (AYGESTIN) 5 MG tablet [id number] Yes  Take 2 tablets (10 mg total) by mouth daily. [name], MD Taking Active   oxybutynin (DITROPAN) 5 MG tablet [id number] Yes Take 1 tablet (5 mg total) by mouth 3 (three) times daily. Elsie Stain, MD Taking Active   psyllium (METAMUCIL) 58.6 % packet [id number] Yes Take 1  packet by mouth daily. [provider] Taking Active   sertraline (ZOLOFT) 100 MG tablet [id number] Yes Take 1 tablet (100 mg total) by mouth at bedtime. Elsie Stain, MD Taking Active             Patient Active Problem List   Diagnosis Date Noted   Primary hypertension 12/10/2021   Medication side effect 10/03/2021   Dysuria 10/03/2021   Hereditary hemochromatosis ([place]) 09/17/2021   Alopecia 09/17/2021   Dental caries 09/17/2021   Carrier of hemochromatosis HFE gene mutation 08/07/2020   Lymphoid hyperplasia 07/30/2020   Menorrhagia 11/27/2018   Rectal bleeding 11/26/2018   Liver lesion 09/09/2018   Hemorrhoid 11/29/2014   Iron deficiency anemia 05/04/2014   Mood disorder ([place]) 02/20/2014   Tears of meniscus and ACL of left knee 09/08/2013   Chronic pain of left knee 05/19/2013   Depression 05/14/2011   H/O: cesarean section 08/28/2010    Conditions to be addressed/monitored per PCP order:   community resources  Care Plan : [place] of Care  Updates made by Ethelda Chick since 02/07/2022 12:00 AM     Problem: Chronic Disease Management and Care Coordination Needs for Iron Deficiency Anemia, Internal hemorrhoid, Anxiety & Depression   Priority: High     Long-Range Goal: Development of Plan of Care for Chronic Disease Management and Care Coordination Needs (Iron Deficiency Anemia & Bleeding due to internal hemorroid; Anxiety & Depression)   Start Date: 07/24/2021  Expected End Date: 02/28/2022  Priority: High  Note:   Current Barriers:  Knowledge Deficits related to plan of care for management of Anxiety with Excessive Worry, Social Anxiety, and Depression: anxiety loss of energy/fatigue disturbed sleep decreased appetite and Iron Deficiency Anemia & Bleeding due to internal hemorrhoid  Care Coordination needs related to Lacks knowledge of community resource: Dental & Vision resources  Chronic Disease Management support and education needs  related to Anxiety with Excessive Worry, Social Anxiety,, Depression: anxiety loss of energy/fatigue disturbed sleep decreased appetite, and Iron Deficiency Anemia  and Bleeding due to internal hemorroid  RNCM Clinical Goal(s):  RNCM will help patient verbalize understanding of plan for management of Anxiety, Depression, and Iron Deficiency Anemia & bleeding due to internal hemorrhoid as evidenced by providing education regarding these chronic diseases. verbalize basic understanding of Anxiety, Depression, and Iron Deficiency Anemia & bleeding due to internal hemorrhoid disease process and self health management plan as evidenced by improved management of these chronic diseases take all medications exactly as prescribed and will call provider for medication related questions as evidenced by medication compliance    attend all scheduled medical appointments: 11/12/21 [facility] Consult for hemorrhoids; 11/21/21 Dr Kennon Rounds - Annual GYN visit; as evidenced by attending all scheduled appointments        demonstrate ongoing adherence to prescribed treatment plan for Anxiety, Depression, and Iron Deficiency Anemia & bleeding due to internal hemorrhoid as evidenced by patient being compliant with treatment plan. continue to work with Consulting civil engineer and/or Social Worker to address care management and care coordination needs related to Anxiety, Depression, and Iron Deficiency Anemia & Bleeding due to internal hemorrhoid  as evidenced by  adherence to CM Team Scheduled appointments     work with Education officer, museum to address Lacks knowledge of community resource: Dental and Vision resources related to the management of Anxiety, Depression, and Iron Deficiency Anemia & bleeding due to internal hemorrhoid as evidenced by review of EMR and patient or Education officer, museum report     through collaboration with Consulting civil engineer, provider, and care team.   Interventions: Inter-disciplinary care team collaboration (see  longitudinal plan of care) Evaluation of current treatment plan related to  self management and patient's adherence to plan as established by provider 08/07/21: BSW completed telephone outreach with patient today. Patient states she is in need of dental and vision resources. Patient states she would also like some assistance pretaining to hair loss. She has been on strong hormones. BSW will send patients PCP and OB a message about a referral or assistance with hair loss. BSW will email resources to [email or web address]. 08/23/21: BSW completed telephone follow up with patient. She stated she did receive the dental resources BSW sent to her, however she does have bad anxiety when it comes to the dentist and wanted to speak with her PCP about it. Her PCP did complete a referral for the dermatologist and she does have a follow up appointment with her OB for medication adjustments. No other resources are needed at this time. 09/30/21: BSW completed telephone outreach with patient. She stated she did speak with her PCP about going to the dentist due to her having a traumatic experience when she was younger she does not like to go know. Patient stated she did have some anxiety around her surgery and going to a new group, but after speaking with her PCP she feels better about it. No other resources are needed at this time.  02/07/22 : BSW completed a telephone outreach with patient. She stated she did receive her blood pressure cuff, she has started back seeing someone at behavioral health. She is now on 2 blood pressure pills at night. No resources are needed at this time.  Hypertension Interventions:  (Status:  New goal.) Long Term Goal Last practice recorded BP readings:  BP Readings from Last 3 Encounters:  11/21/21 (!) 146/93  09/17/21 (!) 132/93  08/20/21 (!) 148/98  Most recent eGFR/CrCl:  Lab Results  Component Value Date   EGFR 72 07/30/2020    No components found for: "CRCL"  Evaluation of current  treatment plan related to hypertension self management and patient's adherence to plan as established by provider Provided education to patient re: stroke prevention, s/s of heart attack and stroke Reviewed medications with patient and discussed importance of compliance Provided assistance with obtaining home blood pressure monitor via collaboration with PCP(request to send order to [employer]); Discussed plans with patient for ongoing care management follow up and provided patient with direct contact information for care management team Reviewed scheduled/upcoming provider appointments including:  Advised to attend all appointments: 7/19 with [place], 8/23 with Dr. [name], 9/14 with Dr. Kennon Rounds, and 9/20 with Dr. Irene Limbo   Anemia/Bleeding:  (Status: Goal on Track (progressing): YES.) Long Term Goal  Assessment of understanding of anemia/bleeding disorder diagnosis Basic overview and discussion of anemia/bleeding disorder or acute disease state Review of most recent labs:  Lab Results  Component Value Date   WBC 6.0 08/20/2021   HGB 12.3 08/20/2021   HCT 36.5 08/20/2021   MCV 86.9 08/20/2021   PLT 405 (H) 08/20/2021     Lab Results  Component Value Date  IRON 140 08/20/2021   TIBC 479 (H) 08/20/2021   FERRITIN 173 08/20/2021     Medications reviewed - Counseled on importance of regular laboratory monitoring as directed by provider; - Advised to call provider or 911 if active bleeding or signs and symptoms of active bleeding occur; - recommended promotion of rest and energy-conserving measures to manage fatigue, such as balancing activity with periods of rest - encouraged dietary changes to increase dietary intake of iron, Vitamin L89 and folic acid as advised/prescribed - Assessed social determinant of health barriers;  Patient reports irregular vaginal bleeding since decreasing Aygestin to two tablets daily, changing pad 3 times daily Advised patient to report heavy  bleeding( soaking a pad in an hour) to her provider Advised patient to keep track of her bleeding on a calendar or journal and share this with her provider Encouraged patient to continue eating iron rich foods, discussed examples   Anxiety & Depression  (Status:  Goal on track:  Yes.)  Long Term Goal Evaluation of current treatment plan related to Anxiety and Depression, Lacks knowledge of community resource: Dental & Vision resources,  self-management and patient's adherence to plan as established by provider. Discussed plans with patient for ongoing care management follow up and provided patient with direct contact information for care management team Evaluation of current treatment plan related to Anxiety & Depression and patient's adherence to plan as established by provider Advised patient to attend visit with Behavioral Health clinician on 12/18/21 Reviewed medications with patient and discussed use of antidepressant Reviewed scheduled/upcoming provider appointments including Screening for signs and symptoms of depression related to chronic disease state   Provieded therapeutic listening  Patient Goals/Self-Care Activities: Take medications as prescribed   Attend all scheduled provider appointments Call pharmacy for medication refills 3-7 days in advance of running out of medications Call provider office for new concerns or questions  Work with the social worker to address care coordination needs and will continue to work with the clinical team to address health care and disease management related needs       Follow up:  Patient agrees to Care Plan and Follow-up.  Plan: The Managed Medicaid care management team will reach out to the patient again over the next 45 days.  Date/time of next scheduled Social Work care management/care coordination outreach:  03/21/22  Mickel Fuchs, Arita Miss, [place] Managed Medicaid Team  [phone]

## 2022-02-07 NOTE — Patient Instructions (Signed)
Visit Information  Ms. Benassi was given information about Medicaid Managed Care team care coordination services as a part of their [place] Medicaid benefit. [name] verbally consented to engagement with the [facility] Managed Care team.   If you are experiencing a medical emergency, please call 911 or report to your local emergency department or urgent care.   If you have a non-emergency medical problem during routine business hours, please contact your provider's office and ask to speak with a nurse.   For questions related to your [facility], please call: [phone] or visit the homepage here: [email or web address]  If you would like to schedule transportation through your [facility], please call the following number at least 2 days in advance of your appointment: [phone]   Rides for urgent appointments can also be made after hours by calling Member Services.  Call the [place] at [phone], at any time, 24 hours a day, 7 days a week. If you are in danger or need immediate medical attention call 911.  If you would like help to quit smoking, call 1-800-QUIT-NOW 816-839-2356) OR Espaol: 1-855-Djelo-Ya (0-086-761-9509) o para ms informacin haga clic aqu or Text READY to 200-400 to register via text  Ms. Bango - following are the goals we discussed in your visit today:   Goals Addressed   None      Social Worker will follow up in 45 days .   Mickel Fuchs, BSW, [place]  High Risk Managed Medicaid Team  [phone]   Following is a copy of your plan of care:  Care Plan : [place] of Care  Updates made by Ethelda Chick since 02/07/2022 12:00 AM     Problem: Chronic Disease Management and Care Coordination Needs for Iron Deficiency Anemia, Internal hemorrhoid,  Anxiety & Depression   Priority: High     Long-Range Goal: Development of Plan of Care for Chronic Disease Management and Care Coordination Needs (Iron Deficiency Anemia & Bleeding due to internal hemorroid; Anxiety & Depression)   Start Date: 07/24/2021  Expected End Date: 02/28/2022  Priority: High  Note:   Current Barriers:  Knowledge Deficits related to plan of care for management of Anxiety with Excessive Worry, Social Anxiety, and Depression: anxiety loss of energy/fatigue disturbed sleep decreased appetite and Iron Deficiency Anemia & Bleeding due to internal hemorrhoid  Care Coordination needs related to Lacks knowledge of community resource: Dental & Vision resources  Chronic Disease Management support and education needs related to Anxiety with Excessive Worry, Social Anxiety,, Depression: anxiety loss of energy/fatigue disturbed sleep decreased appetite, and Iron Deficiency Anemia  and Bleeding due to internal hemorroid  RNCM Clinical Goal(s):  RNCM will help patient verbalize understanding of plan for management of Anxiety, Depression, and Iron Deficiency Anemia & bleeding due to internal hemorrhoid as evidenced by providing education regarding these chronic diseases. verbalize basic understanding of Anxiety, Depression, and Iron Deficiency Anemia & bleeding due to internal hemorrhoid disease process and self health management plan as evidenced by improved management of these chronic diseases take all medications exactly as prescribed and will call provider for medication related questions as evidenced by medication compliance    attend all scheduled medical appointments: 11/12/21 [facility] Consult for hemorrhoids; 11/21/21 Dr Kennon Rounds - Annual GYN visit; as evidenced by attending all scheduled appointments        demonstrate ongoing adherence to prescribed  treatment plan for Anxiety, Depression, and Iron Deficiency Anemia & bleeding due to internal hemorrhoid as  evidenced by patient being compliant with treatment plan. continue to work with Consulting civil engineer and/or Social Worker to address care management and care coordination needs related to Anxiety, Depression, and Iron Deficiency Anemia & Bleeding due to internal hemorrhoid  as evidenced by adherence to CM Team Scheduled appointments     work with Education officer, museum to address Lacks knowledge of community resource: Dental and Vision resources related to the management of Anxiety, Depression, and Iron Deficiency Anemia & bleeding due to internal hemorrhoid as evidenced by review of EMR and patient or Education officer, museum report     through collaboration with Consulting civil engineer, provider, and care team.   Interventions: Inter-disciplinary care team collaboration (see longitudinal plan of care) Evaluation of current treatment plan related to  self management and patient's adherence to plan as established by provider 08/07/21: BSW completed telephone outreach with patient today. Patient states she is in need of dental and vision resources. Patient states she would also like some assistance pretaining to hair loss. She has been on strong hormones. BSW will send patients PCP and OB a message about a referral or assistance with hair loss. BSW will email resources to [email or web address]. 08/23/21: BSW completed telephone follow up with patient. She stated she did receive the dental resources BSW sent to her, however she does have bad anxiety when it comes to the dentist and wanted to speak with her PCP about it. Her PCP did complete a referral for the dermatologist and she does have a follow up appointment with her OB for medication adjustments. No other resources are needed at this time. 09/30/21: BSW completed telephone outreach with patient. She stated she did speak with her PCP about going to the dentist due to her having a traumatic experience when she was younger she does not like to go know. Patient stated she did have some anxiety  around her surgery and going to a new group, but after speaking with her PCP she feels better about it. No other resources are needed at this time.  02/07/22 : BSW completed a telephone outreach with patient. She stated she did receive her blood pressure cuff, she has started back seeing someone at behavioral health. She is now on 2 blood pressure pills at night. No resources are needed at this time.  Hypertension Interventions:  (Status:  New goal.) Long Term Goal Last practice recorded BP readings:  BP Readings from Last 3 Encounters:  11/21/21 (!) 146/93  09/17/21 (!) 132/93  08/20/21 (!) 148/98  Most recent eGFR/CrCl:  Lab Results  Component Value Date   EGFR 72 07/30/2020    No components found for: "CRCL"  Evaluation of current treatment plan related to hypertension self management and patient's adherence to plan as established by provider Provided education to patient re: stroke prevention, s/s of heart attack and stroke Reviewed medications with patient and discussed importance of compliance Provided assistance with obtaining home blood pressure monitor via collaboration with PCP(request to send order to [employer]); Discussed plans with patient for ongoing care management follow up and provided patient with direct contact information for care management team Reviewed scheduled/upcoming provider appointments including:  Advised to attend all appointments: 7/19 with [place], 8/23 with Dr. [name], 9/14 with Dr. Kennon Rounds, and 9/20 with Dr. Irene Limbo   Anemia/Bleeding:  (Status: Goal on Track (progressing): YES.) Long Term Goal  Assessment of understanding of anemia/bleeding disorder diagnosis  Basic overview and discussion of anemia/bleeding disorder or acute disease state Review of most recent labs:  Lab Results  Component Value Date   WBC 6.0 08/20/2021   HGB 12.3 08/20/2021   HCT 36.5 08/20/2021   MCV 86.9 08/20/2021   PLT 405 (H) 08/20/2021     Lab Results   Component Value Date   IRON 140 08/20/2021   TIBC 479 (H) 08/20/2021   FERRITIN 173 08/20/2021     Medications reviewed - Counseled on importance of regular laboratory monitoring as directed by provider; - Advised to call provider or 911 if active bleeding or signs and symptoms of active bleeding occur; - recommended promotion of rest and energy-conserving measures to manage fatigue, such as balancing activity with periods of rest - encouraged dietary changes to increase dietary intake of iron, Vitamin H78 and folic acid as advised/prescribed - Assessed social determinant of health barriers;  Patient reports irregular vaginal bleeding since decreasing Aygestin to two tablets daily, changing pad 3 times daily Advised patient to report heavy bleeding( soaking a pad in an hour) to her provider Advised patient to keep track of her bleeding on a calendar or journal and share this with her provider Encouraged patient to continue eating iron rich foods, discussed examples   Anxiety & Depression  (Status:  Goal on track:  Yes.)  Long Term Goal Evaluation of current treatment plan related to Anxiety and Depression, Lacks knowledge of community resource: Dental & Vision resources,  self-management and patient's adherence to plan as established by provider. Discussed plans with patient for ongoing care management follow up and provided patient with direct contact information for care management team Evaluation of current treatment plan related to Anxiety & Depression and patient's adherence to plan as established by provider Advised patient to attend visit with Behavioral Health clinician on 12/18/21 Reviewed medications with patient and discussed use of antidepressant Reviewed scheduled/upcoming provider appointments including Screening for signs and symptoms of depression related to chronic disease state   Provieded therapeutic listening  Patient Goals/Self-Care Activities: Take medications as  prescribed   Attend all scheduled provider appointments Call pharmacy for medication refills 3-7 days in advance of running out of medications Call provider office for new concerns or questions  Work with the social worker to address care coordination needs and will continue to work with the clinical team to address health care and disease management related needs

## 2022-02-13 ENCOUNTER — Ambulatory Visit
Admission: RE | Admit: 2022-02-13 | Discharge: 2022-02-13 | Disposition: A | Discharge status: Home / Self Care | Payer: Medicaid Other | Source: Ambulatory Visit | Attending: Family Medicine | Admitting: Family Medicine

## 2022-02-13 ENCOUNTER — Ambulatory Visit: Payer: Medicaid Other | Admitting: Family Medicine

## 2022-02-13 DIAGNOSIS — Z1231 Encounter for screening mammogram for malignant neoplasm of breast: Secondary | ICD-10-CM

## 2022-02-19 ENCOUNTER — Other Ambulatory Visit: Payer: Self-pay | Admitting: *Deleted

## 2022-02-19 ENCOUNTER — Other Ambulatory Visit: Payer: Self-pay

## 2022-02-19 ENCOUNTER — Inpatient Hospital Stay: Payer: Medicaid Other | Attending: Hematology

## 2022-02-19 ENCOUNTER — Inpatient Hospital Stay: Payer: Medicaid Other | Admitting: Hematology

## 2022-02-19 DIAGNOSIS — D509 Iron deficiency anemia, unspecified: Secondary | ICD-10-CM | POA: Diagnosis present

## 2022-02-19 DIAGNOSIS — D500 Iron deficiency anemia secondary to blood loss (chronic): Secondary | ICD-10-CM

## 2022-02-19 LAB — CBC WITH DIFFERENTIAL (CANCER CENTER ONLY)
Abs Immature Granulocytes: 0.01 10*3/uL (ref 0.00–0.07)
Basophils Absolute: 0 10*3/uL (ref 0.0–0.1)
Basophils Relative: 1 %
Eosinophils Absolute: 0.2 10*3/uL (ref 0.0–0.5)
Eosinophils Relative: 2 %
HCT: 36.7 % (ref 36.0–46.0)
Hemoglobin: 12.3 g/dL (ref 12.0–15.0)
Immature Granulocytes: 0 %
Lymphocytes Relative: 30 %
Lymphs Abs: 2 10*3/uL (ref 0.7–4.0)
MCH: 29.6 pg (ref 26.0–34.0)
MCHC: 33.5 g/dL (ref 30.0–36.0)
MCV: 88.2 fL (ref 80.0–100.0)
Monocytes Absolute: 0.5 10*3/uL (ref 0.1–1.0)
Monocytes Relative: 8 %
Neutro Abs: 4.1 10*3/uL (ref 1.7–7.7)
Neutrophils Relative %: 59 %
Platelet Count: 412 10*3/uL — ABNORMAL HIGH (ref 150–400)
RBC: 4.16 MIL/uL (ref 3.87–5.11)
RDW: 12.7 % (ref 11.5–15.5)
WBC Count: 6.8 10*3/uL (ref 4.0–10.5)
nRBC: 0 % (ref 0.0–0.2)

## 2022-02-19 LAB — CMP (CANCER CENTER ONLY)
ALT: 27 U/L (ref 0–44)
AST: 26 U/L (ref 15–41)
Albumin: 4.2 g/dL (ref 3.5–5.0)
Alkaline Phosphatase: 37 U/L — ABNORMAL LOW (ref 38–126)
Anion gap: 6 (ref 5–15)
BUN: 12 mg/dL (ref 6–20)
CO2: 22 mmol/L (ref 22–32)
Calcium: 8.7 mg/dL — ABNORMAL LOW (ref 8.9–10.3)
Chloride: 108 mmol/L (ref 98–111)
Creatinine: 0.94 mg/dL (ref 0.44–1.00)
GFR, Estimated: >60 mL/min (ref >60)
Glucose, Bld: 97 mg/dL (ref 70–99)
Potassium: 3.9 mmol/L (ref 3.5–5.1)
Sodium: 136 mmol/L (ref 135–145)
Total Bilirubin: 0.3 mg/dL (ref 0.3–1.2)
Total Protein: 7 g/dL (ref 6.5–8.1)

## 2022-02-19 LAB — IRON AND IRON BINDING CAPACITY (CC-WL,HP ONLY)
Iron: 159 ug/dL (ref 28–170)
Saturation Ratios: 31 % (ref 10.4–31.8)
TIBC: 508 ug/dL — ABNORMAL HIGH (ref 250–450)
UIBC: 349 ug/dL (ref 148–442)

## 2022-02-19 LAB — FERRITIN: Ferritin: 60 ng/mL (ref 11–307)

## 2022-02-20 ENCOUNTER — Ambulatory Visit: Payer: Medicaid Other | Admitting: Clinical

## 2022-02-20 ENCOUNTER — Ambulatory Visit: Payer: Medicaid Other | Admitting: Hematology

## 2022-02-20 ENCOUNTER — Other Ambulatory Visit: Payer: Medicaid Other

## 2022-02-20 DIAGNOSIS — F4323 Adjustment disorder with mixed anxiety and depressed mood: Secondary | ICD-10-CM

## 2022-02-21 ENCOUNTER — Other Ambulatory Visit: Payer: Self-pay | Admitting: *Deleted

## 2022-02-21 NOTE — Patient Outreach (Signed)
Medicaid Managed Care   Nurse Care Manager Note  02/21/2022 Name:  Leah Baker MRN:  [id number] [name]:  08/04/1980  Leah Baker is an 41 y.o. year old female who is Baker primary patient of Leah Gaskins Burnett Harry, MD.  The [facility] Managed Care Coordination team was consulted for assistance with:    HTN Anxiety anemia  [name] was given information about Medicaid Managed Care Coordination team services today. Leah Baker Patient agreed to services and verbal consent obtained.  Engaged with patient by telephone for follow up visit in response to provider referral for case management and/or care coordination services.   Assessments/Interventions:  Review of past medical history, allergies, medications, health status, including review of consultants reports, laboratory and other test data, was performed as part of comprehensive evaluation and provision of chronic care management services.  SDOH (Social Determinants of Health) assessments and interventions performed: SDOH Interventions    Flowsheet Row Patient Outreach Telephone from 02/21/2022 in [place] Patient Outreach Telephone from 07/24/2021 in [place] Coordination Office Visit from 05/02/2021 in [place] Office Visit from 07/30/2020 in [place] Interventions -- Intervention Not Indicated -- --  Housing Interventions -- Intervention Not Indicated -- --  Transportation Interventions Intervention Not Indicated Intervention Not Indicated -- --  Depression Interventions/Treatment  -- Medication Medication, Currently on Treatment Counseling  Financial Strain Interventions -- Intervention Not Indicated -- --  Physical Activity Interventions -- Intervention Not Indicated -- --  Stress Interventions -- Intervention Not Indicated -- --  Social Connections  Interventions -- Intervention Not Indicated -- --       Care Plan  Allergies  Allergen Reactions   Latex Itching and Rash    Medications Reviewed Today     Reviewed by Leah Montane, RN (Registered Nurse) on 02/21/22 at [place] List Status: <None>   Medication Order Taking? Sig Documenting Provider Last Dose Status Informant  acetaminophen (TYLENOL) 500 MG tablet [id number] Yes Take 1 tablet (500 mg total) by mouth every 6 (six) hours as needed. Leah Picket, MD Taking Active Self  amLODipine (NORVASC) 10 MG tablet [id number] Yes Take 1 tablet (10 mg total) by mouth daily. Leah Stain, MD Taking Active   Blood Pressure Monitoring (BLOOD PRESSURE KIT) DEVI [id number] Yes Use to measure blood pressure Leah Stain, MD Taking Active   diclofenac Sodium (VOLTAREN) 1 % GEL [id number] Yes Apply 2 g topically 4 (four) times daily. Leah Stain, MD Taking Active   Ferrous Sulfate (IRON) 325 (65 Fe) MG TABS [id number] Yes Take by mouth in the morning and at bedtime. [provider] Taking Active Self           Med Note (Leah Baker   Fri Feb 21, 2022  9:20 AM)    fluticasone (FLONASE) 50 MCG/ACT nasal spray [id number] Yes Place 2 sprays into both nostrils daily. Leah Stain, MD Taking Active   hydrocortisone (ANUSOL-HC) 2.5 % rectal cream [id number] Yes Place 1 application. rectally 2 (two) times daily. Leah Stain, MD Taking Active   Menthol-Camphor (TIGER [facility] ARTHRITIS RUB EX) [id number] Yes Apply 1 application topically daily as needed (knee pain). [provider] Taking Active Self  norethindrone (AYGESTIN) 5 MG tablet [id number] Yes Take 2 tablets (10 mg total) by mouth daily. Leah Jude, MD Taking Active  oxybutynin (DITROPAN) 5 MG tablet [id number] Yes Take 1 tablet (5 mg total) by mouth 3 (three) times daily. Leah Stain, MD Taking Active   psyllium (METAMUCIL) 58.6 % packet [id number] Yes Take 1 packet by mouth daily. [provider] Taking Active   sertraline (ZOLOFT) 100 MG tablet [id number] Yes Take 1 tablet (100 mg total) by mouth at bedtime. Leah Stain, MD Taking Active             Patient Active Problem List   Diagnosis Date Noted   Primary hypertension 12/10/2021   Medication side effect 10/03/2021   Dysuria 10/03/2021   Hereditary hemochromatosis ([place]) 09/17/2021   Alopecia 09/17/2021   Dental caries 09/17/2021   Carrier of hemochromatosis HFE gene mutation 08/07/2020   Lymphoid hyperplasia 07/30/2020   Menorrhagia 11/27/2018   Rectal bleeding 11/26/2018   Liver lesion 09/09/2018   Hemorrhoid 11/29/2014   Iron deficiency anemia 05/04/2014   Mood disorder ([place]) 02/20/2014   Tears of meniscus and ACL of left knee 09/08/2013   Chronic pain of left knee 05/19/2013   Depression 05/14/2011   H/O: cesarean section 08/28/2010    Conditions to be addressed/monitored per PCP order:  HTN, Anxiety, and anemia  Care Plan : RN Care Manager Plan of Care  Updates made by Leah Montane, RN since 02/21/2022 12:00 AM     Problem: Chronic Disease Management and Care Coordination Needs for Iron Deficiency Anemia, Internal hemorrhoid, Anxiety & Depression   Priority: High     Long-Range Goal: Development of Plan of Care for Chronic Disease Management and Care Coordination Needs (Iron Deficiency Anemia & Bleeding due to internal hemorroid; Anxiety & Depression)   Start Date: 07/24/2021  Expected End Date: 02/28/2022  Priority: High  Note:   Current Barriers:  Knowledge Deficits related to plan of care for management of Anxiety with Excessive Worry, Social Anxiety, and Depression: anxiety loss of energy/fatigue disturbed sleep decreased appetite and Iron Deficiency Anemia & Bleeding due to internal hemorrhoid  Care Coordination needs related to Lacks knowledge of community resource: Dental & Vision resources  Chronic Disease Management support and education needs related to Anxiety with  Excessive Worry, Social Anxiety,, Depression: anxiety loss of energy/fatigue disturbed sleep decreased appetite, and Iron Deficiency Anemia  and Bleeding due to internal hemorroid  RNCM Clinical Goal(s):  RNCM will help patient verbalize understanding of plan for management of Anxiety, Depression, and Iron Deficiency Anemia & bleeding due to internal hemorrhoid as evidenced by providing education regarding these chronic diseases. verbalize basic understanding of Anxiety, Depression, and Iron Deficiency Anemia & bleeding due to internal hemorrhoid disease process and self health management plan as evidenced by improved management of these chronic diseases take all medications exactly as prescribed and will call provider for medication related questions as evidenced by medication compliance    attend all scheduled medical appointments: 11/12/21 [facility] Consult for hemorrhoids; 11/21/21 Dr Kennon Rounds - Annual GYN visit; as evidenced by attending all scheduled appointments        demonstrate ongoing adherence to prescribed treatment plan for Anxiety, Depression, and Iron Deficiency Anemia & bleeding due to internal hemorrhoid as evidenced by patient being compliant with treatment plan. continue to work with Consulting civil engineer and/or Social Worker to address care management and care coordination needs related to Anxiety, Depression, and Iron Deficiency Anemia & Bleeding due to internal hemorrhoid  as evidenced by adherence to CM Team Scheduled appointments     work with Education officer, museum to  address Lacks knowledge of community resource: Dental and Vision resources related to the management of Anxiety, Depression, and Iron Deficiency Anemia & bleeding due to internal hemorrhoid as evidenced by review of EMR and patient or social worker report     through collaboration with Consulting civil engineer, provider, and care team.   Interventions: Inter-disciplinary care team collaboration (see longitudinal plan of  care) Evaluation of current treatment plan related to  self management and patient's adherence to plan as established by provider 08/07/21: BSW completed telephone outreach with patient today. Patient states she is in need of dental and vision resources. Patient states she would also like some assistance pretaining to hair loss. She has been on strong hormones. BSW will send patients PCP and OB Baker message about Baker referral or assistance with hair loss. BSW will email resources to [email or web address]. 08/23/21: BSW completed telephone follow up with patient. She stated she did receive the dental resources BSW sent to her, however she does have bad anxiety when it comes to the dentist and wanted to speak with her PCP about it. Her PCP did complete Baker referral for the dermatologist and she does have Baker follow up appointment with her OB for medication adjustments. No other resources are needed at this time. 09/30/21: BSW completed telephone outreach with patient. She stated she did speak with her PCP about going to the dentist due to her having Baker traumatic experience when she was younger she does not like to go know. Patient stated she did have some anxiety around her surgery and going to Baker new group, but after speaking with her PCP she feels better about it. No other resources are needed at this time.  02/07/22 : BSW completed Baker telephone outreach with patient. She stated she did receive her blood pressure cuff, she has started back seeing someone at behavioral health. She is now on 2 blood pressure pills at night. No resources are needed at this time.  Hypertension Interventions:  (Status:  Goal on track:  Yes.) Long Term Goal -PCP increased amlodipine at 12/2021 visit, checking BP 3 times daily, last BP 137/94 Last practice recorded BP readings:  BP Readings from Last 3 Encounters:  02/19/22 135/87  01/22/22 (!) 132/92  11/21/21 (!) 146/93  Most recent eGFR/CrCl:  Lab Results  Component Value Date   EGFR 72  07/30/2020    No components found for: "CRCL"  Evaluation of current treatment plan related to hypertension self management and patient's adherence to plan as established by provider Reviewed medications with patient and discussed importance of compliance Discussed plans with patient for ongoing care management follow up and provided patient with direct contact information for care management team Advised patient, providing education and rationale, to monitor blood pressure daily and record, calling PCP for findings outside established parameters Reviewed scheduled/upcoming provider appointments including:  Advised to keep Baker journal of collected BP, take journal with her to appointment on 02/24/22 Advised to attend all appointments: 02/24/22 with [facility] Pharmacist, 03/13/22 [place] at [facility], 04/20/22 with GYN and 06/11/22 with PCP   Anemia/Bleeding:  (Status: Goal on Track (progressing): YES.) Long Term Goal  Assessment of understanding of anemia/bleeding disorder diagnosis Basic overview and discussion of anemia/bleeding disorder or acute disease state Review of most recent labs:  Lab Results  Component Value Date   WBC 6.8 02/19/2022   HGB 12.3 02/19/2022   HCT 36.7 02/19/2022   MCV 88.2 02/19/2022   PLT 412 (H) 02/19/2022     Lab Results  Component Value Date   IRON 159 02/19/2022   TIBC 508 (H) 02/19/2022   FERRITIN 60 02/19/2022     Medications reviewed - Counseled on importance of regular laboratory monitoring as directed by provider; - Advised to call provider or 911 if active bleeding or signs and symptoms of active bleeding occur; - recommended promotion of rest and energy-conserving measures to manage fatigue, such as balancing activity with periods of rest - encouraged dietary changes to increase dietary intake of iron, Vitamin C13 and folic acid as advised/prescribed - Assessed social determinant of health barriers;  Advised patient to report heavy bleeding( soaking Baker pad in an  hour) to her provider Advised patient to keep track of her bleeding on Baker calendar or journal and share this with her provider Encouraged patient to continue eating iron rich foods, discussed examples Advised patient to follow up with General Surgeon for plan of care regarding internal hemorrhoids   Anxiety & Depression  (Status:  Goal on track:  Yes.)  Long Term Goal Evaluation of current treatment plan related to Anxiety and Depression, Lacks knowledge of community resource: Dental & Vision resources,  self-management and patient's adherence to plan as established by provider. Discussed plans with patient for ongoing care management follow up and provided patient with direct contact information for care management team Evaluation of current treatment plan related to Anxiety & Depression and patient's adherence to plan as established by provider Advised patient to attend visit with Behavioral Health clinician on 03/13/22 Reviewed medications with patient and discussed use of antidepressant Provided patient with MyChart educational materials related to stress management Reviewed scheduled/upcoming provider appointments including 9/25 with [facility] Pharmacist, 10/12 with [facility], 11/9 with GYN   Provieded therapeutic listening  Patient Goals/Self-Care Activities: Take medications as prescribed   Attend all scheduled provider appointments Call pharmacy for medication refills 3-7 days in advance of running out of medications Call provider office for new concerns or questions  Work with the social worker to address care coordination needs and will continue to work with the clinical team to address health care and disease management related needs       Follow Up:  Patient agrees to Care Plan and Follow-up.  Plan: The Managed Medicaid care management team will reach out to the patient again over the next 30 days.  Date/time of next scheduled RN care management/care coordination outreach:  03/26/22  @ 10:30am  Lurena Joiner RN, BSN [place] RN Care Coordinator

## 2022-02-21 NOTE — Patient Instructions (Signed)
Visit Information  Ms. Becht was given information about Medicaid Managed Care team care coordination services as a part of their [place] benefit. [name] verbally consented to engagement with the [facility] Managed Care team.   If you are experiencing a medical emergency, please call 911 or report to your local emergency department or urgent care.   If you have a non-emergency medical problem during routine business hours, please contact your provider's office and ask to speak with a nurse.   For questions related to your [facility], please call: [phone] or visit the homepage here: [email or web address]  If you would like to schedule transportation through your [facility], please call the following number at least 2 days in advance of your appointment: [phone]   Rides for urgent appointments can also be made after hours by calling Member Services.  Call the [place] at [phone], at any time, 24 hours a day, 7 days a week. If you are in danger or need immediate medical attention call 911.  If you would like help to quit smoking, call 1-800-QUIT-NOW 484-509-8037) OR Espaol: 1-855-Djelo-Ya (2-025-427-0623) o para ms informacin haga clic aqu or Text READY to 200-400 to register via text  [name],   Please see education materials related to stress provided by MyChart link.  Patient verbalizes understanding of instructions and care plan provided today and agrees to view in [place]. Active MyChart status and patient understanding of how to access instructions and care plan via MyChart confirmed with patient.     Telephone follow up appointment with Managed Medicaid care management team member scheduled for:03/26/22 @ 10:30am  Lurena Joiner RN, BSN [place] RN Care  Coordinator   Following is a copy of your plan of care:  Care Plan : RN Care Manager Plan of Care  Updates made by Melissa Montane, RN since 02/21/2022 12:00 AM     Problem: Chronic Disease Management and Care Coordination Needs for Iron Deficiency Anemia, Internal hemorrhoid, Anxiety & Depression   Priority: High     Long-Range Goal: Development of Plan of Care for Chronic Disease Management and Care Coordination Needs (Iron Deficiency Anemia & Bleeding due to internal hemorroid; Anxiety & Depression)   Start Date: 07/24/2021  Expected End Date: 02/28/2022  Priority: High  Note:   Current Barriers:  Knowledge Deficits related to plan of care for management of Anxiety with Excessive Worry, Social Anxiety, and Depression: anxiety loss of energy/fatigue disturbed sleep decreased appetite and Iron Deficiency Anemia & Bleeding due to internal hemorrhoid  Care Coordination needs related to Lacks knowledge of community resource: Dental & Vision resources  Chronic Disease Management support and education needs related to Anxiety with Excessive Worry, Social Anxiety,, Depression: anxiety loss of energy/fatigue disturbed sleep decreased appetite, and Iron Deficiency Anemia  and Bleeding due to internal hemorroid  RNCM Clinical Goal(s):  RNCM will help patient verbalize understanding of plan for management of Anxiety, Depression, and Iron Deficiency Anemia & bleeding due to internal hemorrhoid as evidenced by providing education regarding these chronic diseases. verbalize basic understanding of Anxiety, Depression, and Iron Deficiency Anemia & bleeding due to internal hemorrhoid disease process and self health management plan as evidenced by improved management of these chronic diseases take all medications exactly as prescribed and will call provider for medication related questions as evidenced by medication compliance    attend all scheduled medical appointments: 11/12/21 [place]  Surgical Consult for hemorrhoids; 11/21/21 Dr Kennon Rounds - Annual GYN visit; as evidenced by attending all scheduled appointments        demonstrate ongoing adherence to prescribed treatment plan for Anxiety, Depression, and Iron Deficiency Anemia & bleeding due to internal hemorrhoid as evidenced by patient being compliant with treatment plan. continue to work with Consulting civil engineer and/or Social Worker to address care management and care coordination needs related to Anxiety, Depression, and Iron Deficiency Anemia & Bleeding due to internal hemorrhoid  as evidenced by adherence to CM Team Scheduled appointments     work with Education officer, museum to address Lacks knowledge of community resource: Dental and Vision resources related to the management of Anxiety, Depression, and Iron Deficiency Anemia & bleeding due to internal hemorrhoid as evidenced by review of EMR and patient or Education officer, museum report     through collaboration with Consulting civil engineer, provider, and care team.   Interventions: Inter-disciplinary care team collaboration (see longitudinal plan of care) Evaluation of current treatment plan related to  self management and patient's adherence to plan as established by provider 08/07/21: BSW completed telephone outreach with patient today. Patient states she is in need of dental and vision resources. Patient states she would also like some assistance pretaining to hair loss. She has been on strong hormones. BSW will send patients PCP and OB a message about a referral or assistance with hair loss. BSW will email resources to [email or web address]. 08/23/21: BSW completed telephone follow up with patient. She stated she did receive the dental resources BSW sent to her, however she does have bad anxiety when it comes to the dentist and wanted to speak with her PCP about it. Her PCP did complete a referral for the dermatologist and she does have a follow up appointment with her OB for medication adjustments. No other  resources are needed at this time. 09/30/21: BSW completed telephone outreach with patient. She stated she did speak with her PCP about going to the dentist due to her having a traumatic experience when she was younger she does not like to go know. Patient stated she did have some anxiety around her surgery and going to a new group, but after speaking with her PCP she feels better about it. No other resources are needed at this time.  02/07/22 : BSW completed a telephone outreach with patient. She stated she did receive her blood pressure cuff, she has started back seeing someone at behavioral health. She is now on 2 blood pressure pills at night. No resources are needed at this time.  Hypertension Interventions:  (Status:  Goal on track:  Yes.) Long Term Goal -PCP increased amlodipine at 12/2021 visit, checking BP 3 times daily, last BP 137/94 Last practice recorded BP readings:  BP Readings from Last 3 Encounters:  02/19/22 135/87  01/22/22 (!) 132/92  11/21/21 (!) 146/93  Most recent eGFR/CrCl:  Lab Results  Component Value Date   EGFR 72 07/30/2020    No components found for: "CRCL"  Evaluation of current treatment plan related to hypertension self management and patient's adherence to plan as established by provider Reviewed medications with patient and discussed importance of compliance Discussed plans with patient for ongoing care management follow up and provided patient with direct contact information for care management team Advised patient, providing education and rationale, to monitor blood pressure daily and record, calling PCP for findings outside established parameters Reviewed scheduled/upcoming provider appointments including:  Advised to keep a journal of collected BP, take  journal with her to appointment on 02/24/22 Advised to attend all appointments: 02/24/22 with [facility] Pharmacist, 03/13/22 [place] [facility], 04/20/22 with GYN and 06/11/22 with PCP   Anemia/Bleeding:  (Status: Goal on  Track (progressing): YES.) Long Term Goal  Assessment of understanding of anemia/bleeding disorder diagnosis Basic overview and discussion of anemia/bleeding disorder or acute disease state Review of most recent labs:  Lab Results  Component Value Date   WBC 6.8 02/19/2022   HGB 12.3 02/19/2022   HCT 36.7 02/19/2022   MCV 88.2 02/19/2022   PLT 412 (H) 02/19/2022     Lab Results  Component Value Date   IRON 159 02/19/2022   TIBC 508 (H) 02/19/2022   FERRITIN 60 02/19/2022     Medications reviewed - Counseled on importance of regular laboratory monitoring as directed by provider; - Advised to call provider or 911 if active bleeding or signs and symptoms of active bleeding occur; - recommended promotion of rest and energy-conserving measures to manage fatigue, such as balancing activity with periods of rest - encouraged dietary changes to increase dietary intake of iron, Vitamin Q91 and folic acid as advised/prescribed - Assessed social determinant of health barriers;  Advised patient to report heavy bleeding( soaking a pad in an hour) to her provider Advised patient to keep track of her bleeding on a calendar or journal and share this with her provider Encouraged patient to continue eating iron rich foods, discussed examples Advised patient to follow up with General Surgeon for plan of care regarding internal hemorrhoids   Anxiety & Depression  (Status:  Goal on track:  Yes.)  Long Term Goal Evaluation of current treatment plan related to Anxiety and Depression, Lacks knowledge of community resource: Dental & Vision resources,  self-management and patient's adherence to plan as established by provider. Discussed plans with patient for ongoing care management follow up and provided patient with direct contact information for care management team Evaluation of current treatment plan related to Anxiety & Depression and patient's adherence to plan as established by provider Advised  patient to attend visit with Behavioral Health clinician on 03/13/22 Reviewed medications with patient and discussed use of antidepressant Provided patient with MyChart educational materials related to stress management Reviewed scheduled/upcoming provider appointments including [facility], 10/12 with [facility], 11/9 with GYN   Provieded therapeutic listening  Patient Goals/Self-Care Activities: Take medications as prescribed   Attend all scheduled provider appointments Call pharmacy for medication refills 3-7 days in advance of running out of medications Call provider office for new concerns or questions  Work with the social worker to address care coordination needs and will continue to work with the clinical team to address health care and disease management related needs

## 2022-02-23 ENCOUNTER — Encounter: Payer: Self-pay | Admitting: Hematology

## 2022-02-24 ENCOUNTER — Ambulatory Visit: Payer: Medicaid Other | Attending: Critical Care Medicine | Admitting: Pharmacist

## 2022-02-24 VITALS — BP 118/76 | HR 92

## 2022-02-24 DIAGNOSIS — I10 Essential (primary) hypertension: Secondary | ICD-10-CM

## 2022-02-24 NOTE — Progress Notes (Signed)
   S:     No chief complaint on file.  Leah Baker is a 41 y.o. female who presents for hypertension evaluation, education, and management.  PMH is significant for HTN, Hemorrhoid, anemia.  Patient was referred and last seen by Primary Care Provider, Dr. [name], on 01/22/2022.   At last visit, BP was 132/92, Amlodipine dose was increased to 10 mg.   Today, patient arrives in good spirits and presents without assistance. Denies, headache, swelling.  Patient reports lightheadedness and blurred vision when she wakes up.  Patient reports hypertension was diagnosed ~April 2023.   Family/Social history:  -Fhx: kidney failure -Tobacco: former smoker -Alcohol: drinks on holidays only  Medication adherence is optimal. Patient has not taken BP medications today, usually takes it at night  Current antihypertensives include: Amlodipine 10 mg daily  Reported home BP readings: 130s-140s/80s-90s  Patient reported dietary habits:  -Patient is adherent with salt restrictions. -Caffeine: drinks one coffee daily, and energy drinks about 3 times a week.  Patient-reported exercise habits:  -Walks 30 minutes about 2-3 times daily.  O:  Vitals:   02/24/22 0847  BP: 118/76  Pulse: 92   Last 3 Office BP readings: BP Readings from Last 3 Encounters:  02/24/22 118/76  02/19/22 135/87  01/22/22 (!) 132/92   BMET    Component Value Date/Time   NA 136 02/19/2022 0859   NA 139 07/30/2020 0906   K 3.9 02/19/2022 0859   CL 108 02/19/2022 0859   CO2 22 02/19/2022 0859   GLUCOSE 97 02/19/2022 0859   BUN 12 02/19/2022 0859   BUN 12 07/30/2020 0906   CREATININE 0.94 02/19/2022 0859   CREATININE 0.83 05/03/2014 1628   CALCIUM 8.7 (L) 02/19/2022 0859   GFRNONAA >60 02/19/2022 0859   GFRAA 75 03/28/2020 0919   GFRAA >60 02/28/2019 1240   Renal function: Estimated Creatinine Clearance: 74.8 mL/min (by C-G formula based on SCr of 0.94 mg/dL).  Clinical ASCVD: No  The ASCVD Risk score  (Arnett DK, et al., 2019) failed to calculate for the following reasons:   Cannot find a previous HDL lab   Cannot find a previous total cholesterol lab  A/P: Hypertension diagnosed in April currently controlled on current medications. BP goal < 130/80 mmHg. Medication adherence appears appropriate.  -Continued Amlodipine 10 mg daily.  -Patient was instructed to bring her home BP cuff next visit to correlate home and clinic readings. -F/u labs ordered - none -Counseled on lifestyle modifications for blood pressure control including reduced dietary sodium, increased exercise, adequate sleep. -Encouraged patient to check BP at home and bring log of readings to next visit. Counseled on proper use of home BP cuff.    Results reviewed and written information provided.    Written patient instructions provided. Patient verbalized understanding of treatment plan.  Total time in face to face counseling 30 minutes.    Follow-up:  -Follow up with Pharmacist in 1 month  Patient seen with: Deirdre Evener, PharmD Candidate  [employer]  Class of 2025    Pharmacist: Benard Halsted, PharmD, [place], [place] [phone]

## 2022-02-27 NOTE — BH Specialist Note (Deleted)
Integrated Behavioral Health via Telemedicine Visit  02/27/2022 Leah Baker [id number]  Number of Leah Baker Clinician visits: 3- Third Visit  Session Start time: 9030   Session End time: 0923  Total time in minutes: 33   Referring Provider: *** Patient/Family location: [facility] Provider location: *** All persons participating in visit: *** Types of Service: {CHL AMB TYPE OF SERVICE:[phone]}  I connected with Leah Baker and/or Leah Baker's {family members:20773} via  Telephone or Video Enabled Telemedicine Application  (Video is Caregility application) and verified that I am speaking with the correct person using two identifiers. Discussed confidentiality: {YES/NO:21197}  I discussed the limitations of telemedicine and the availability of in person appointments.  Discussed there is a possibility of technology failure and discussed alternative modes of communication if that failure occurs.  I discussed that engaging in this telemedicine visit, they consent to the provision of behavioral healthcare and the services will be billed under their insurance.  Patient and/or legal guardian expressed understanding and consented to Telemedicine visit: {YES/NO:21197}  Presenting Concerns: Patient and/or family reports the following symptoms/concerns: *** Duration of problem: ***; Severity of problem: {Mild/Moderate/Severe:20260}  Patient and/or Family's Strengths/Protective Factors: {CHL AMB BH PROTECTIVE FACTORS:[phone]}  Goals Addressed: Patient will:  Reduce symptoms of: {IBH Symptoms:21014056}   Increase knowledge and/or ability of: {IBH Patient Tools:21014057}   Demonstrate ability to: {IBH Goals:21014053}  Progress towards Goals: {CHL AMB BH PROGRESS TOWARDS [phone]}  Interventions: Interventions utilized:  {IBH Interventions:21014054} Standardized Assessments completed: {IBH Screening Tools:21014051}  Patient and/or Family  Response: ***  Assessment: Patient currently experiencing ***.   Patient may benefit from ***.  Plan: Follow up with behavioral health clinician on : *** Behavioral recommendations: *** Referral(s): {IBH Referrals:21014055}  I discussed the assessment and treatment plan with the patient and/or parent/guardian. They were provided an opportunity to ask questions and all were answered. They agreed with the plan and demonstrated an understanding of the instructions.   They were advised to call back or seek an in-person evaluation if the symptoms worsen or if the condition fails to improve as anticipated.  Caroleen Hamman Ronni Osterberg, LCSW

## 2022-02-28 ENCOUNTER — Encounter: Payer: Self-pay | Admitting: Hematology

## 2022-02-28 NOTE — Progress Notes (Signed)
This encounter was created in error - please disregard.

## 2022-03-07 ENCOUNTER — Telehealth: Payer: Self-pay | Admitting: Hematology

## 2022-03-07 NOTE — Telephone Encounter (Signed)
Scheduled follow-up appointment per 9/20 los. Patient is aware.

## 2022-03-13 NOTE — BH Specialist Note (Signed)
Integrated Behavioral Health via Telemedicine Visit  03/28/2022 Leah Baker [id number]  Number of [place] Clinician visits: 4- Fourth Visit  Session Start time: 7672   Session End time: 0947  Total time in minutes: 38   Referring Provider: Darron Doom, MD Patient/Family location: [facility] Provider location: [facility] [place] [facility]  All persons participating in visit: [name] and [place]   Types of Service: Individual psychotherapy and Video visit  I connected with [name] and/or Herbert Spires Haring's  n/a  via  Telephone or Video Enabled Telemedicine Application  (Video is Caregility application) and verified that I am speaking with the correct person using two identifiers. Discussed confidentiality: Yes   I discussed the limitations of telemedicine and the availability of in person appointments.  Discussed there is a possibility of technology failure and discussed alternative modes of communication if that failure occurs.  I discussed that engaging in this telemedicine visit, they consent to the provision of behavioral healthcare and the services will be billed under their insurance.  Patient and/or legal guardian expressed understanding and consented to Telemedicine visit: Yes   Presenting Concerns: Patient and/or family reports the following symptoms/concerns: Overwhelmed with life stress and increased anxiety (father's heart problems, upcoming conversation with family concerning father's care); stress leading to increased blood pressure, sleep difficulty.  Duration of problem: Increase over 3 years; Severity of problem: moderate  Patient and/or Family's Strengths/Protective Factors: Social connections, Concrete supports in place (healthy food, safe environments, etc.), Sense of purpose, and Physical Health (exercise, healthy diet, medication compliance, etc.)  Goals  Addressed: Patient will:  Reduce symptoms of: anxiety and stress   Demonstrate ability to: Increase healthy adjustment to current life circumstances  Progress towards Goals: Ongoing  Interventions: Interventions utilized:  Link to [employer] and Supportive Reflection Standardized Assessments completed: Not Needed  Patient and/or Family Response: Patient agrees with treatment plan.   Assessment: Patient currently experiencing Adjustment disorder with mixed anxiety and depressed mood.   Patient may benefit from continued therapeutic interventions.  Plan: Follow up with behavioral health clinician on : Two weeks Behavioral recommendations:  -Continue plan to have family discussion this afternoon concerning father's healthcare -Consider caregiver support group (on After Visit Summary) for self and family as needed Referral(s): [place] (In Clinic) and [employer]:  caregiver support  I discussed the assessment and treatment plan with the patient and/or parent/guardian. They were provided an opportunity to ask questions and all were answered. They agreed with the plan and demonstrated an understanding of the instructions.   They were advised to call back or seek an in-person evaluation if the symptoms worsen or if the condition fails to improve as anticipated.  Caroleen Hamman Addison Whidbee, LCSW

## 2022-03-21 ENCOUNTER — Other Ambulatory Visit: Payer: Self-pay

## 2022-03-21 NOTE — Patient Outreach (Signed)
Medicaid Managed Care Social Work Note  03/21/2022 Name:  Leah Baker MRN:  [id number] [name]:  Oct 29, 1980  Leah Baker is an 41 y.o. year old female who is a primary patient of Leah Gaskins Burnett Harry, MD.  The Medicaid Managed Care Coordination team was consulted for assistance with:  Community Resources   [name] was given information about Medicaid Managed Care Coordination team services today. [name] Patient agreed to services and verbal consent obtained.  Engaged with patient  for by telephone forfollow up visit in response to referral for case management and/or care coordination services.   Assessments/Interventions:  Review of past medical history, allergies, medications, health status, including review of consultants reports, laboratory and other test data, was performed as part of comprehensive evaluation and provision of chronic care management services.  SDOH: (Social Determinant of Health) assessments and interventions performed: SDOH Interventions    Flowsheet Row Patient Outreach Telephone from 02/21/2022 in [place] Patient Outreach Telephone from 07/24/2021 in [place] Office Visit from 05/02/2021 in [place] Office Visit from 07/30/2020 in [place] Interventions      Food Insecurity Interventions -- Intervention Not Indicated -- --  Housing Interventions -- Intervention Not Indicated -- --  Transportation Interventions Intervention Not Indicated Intervention Not Indicated -- --  Depression Interventions/Treatment  -- Medication Medication, Currently on Treatment Counseling  Financial Strain Interventions -- Intervention Not Indicated -- --  Physical Activity Interventions -- Intervention Not Indicated -- --  Stress Interventions -- Intervention Not Indicated -- --  Social Connections Interventions --  Intervention Not Indicated -- --     BSW completed a telephone outreach with patient, she stated the past couple of weeks have been hard, her father recently had a heart attack and is still in ICU, patient states she has still been able to attend therapy which has helped her. Patient states no resources are needed at this time.  Advanced Directives Status:  Not addressed in this encounter.  Care Plan                 Allergies  Allergen Reactions   Latex Itching and Rash    Medications Reviewed Today     Reviewed by Melissa Montane, RN (Registered Nurse) on 02/21/22 at [place] List Status: <None>   Medication Order Taking? Sig Documenting Provider Last Dose Status Informant  acetaminophen (TYLENOL) 500 MG tablet [id number] Yes Take 1 tablet (500 mg total) by mouth every 6 (six) hours as needed. Chase Picket, MD Taking Active Self  amLODipine (NORVASC) 10 MG tablet [id number] Yes Take 1 tablet (10 mg total) by mouth daily. Elsie Stain, MD Taking Active   Blood Pressure Monitoring (BLOOD PRESSURE KIT) DEVI [id number] Yes Use to measure blood pressure Elsie Stain, MD Taking Active   diclofenac Sodium (VOLTAREN) 1 % GEL [id number] Yes Apply 2 g topically 4 (four) times daily. Elsie Stain, MD Taking Active   Ferrous Sulfate (IRON) 325 (65 Fe) MG TABS [id number] Yes Take by mouth in the morning and at bedtime. [provider] Taking Active Self           Med Note (ROBB, MELANIE A   Fri Feb 21, 2022  9:20 AM)    fluticasone (FLONASE) 50 MCG/ACT nasal spray [id number] Yes Place 2 sprays into both nostrils daily. Elsie Stain, MD Taking Active  hydrocortisone (ANUSOL-HC) 2.5 % rectal cream [id number] Yes Place 1 application. rectally 2 (two) times daily. Elsie Stain, MD Taking Active   Menthol-Camphor (TIGER [facility] ARTHRITIS RUB EX) [id number] Yes Apply 1 application topically daily as needed (knee pain). [provider] Taking Active Self   norethindrone (AYGESTIN) 5 MG tablet [id number] Yes Take 2 tablets (10 mg total) by mouth daily. [name], MD Taking Active   oxybutynin (DITROPAN) 5 MG tablet [id number] Yes Take 1 tablet (5 mg total) by mouth 3 (three) times daily. Elsie Stain, MD Taking Active   psyllium (METAMUCIL) 58.6 % packet [id number] Yes Take 1 packet by mouth daily. [provider] Taking Active   sertraline (ZOLOFT) 100 MG tablet [id number] Yes Take 1 tablet (100 mg total) by mouth at bedtime. 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   Follow up:  Patient agrees to Care Plan and Follow-up.  Plan: The Managed Medicaid care management team will reach out to the patient again over the next 30 days.  Date/time of next scheduled Social Work care management/care coordination outreach:  04/21/22  Mickel Fuchs, Arita Miss, [place] Medicaid Team  [phone]

## 2022-03-21 NOTE — Patient Instructions (Signed)
Visit Information  Leah Baker was given information about Medicaid Managed Care team care coordination services as a part of their [place] Medicaid benefit. Leah Baker verbally consented to engagement with the [facility] Managed Care team.   If you are experiencing a medical emergency, please call 911 or report to your local emergency department or urgent care.   If you have a non-emergency medical problem during routine business hours, please contact your provider's office and ask to speak with a nurse.   For questions related to your [facility], please call: [phone] or visit the homepage here: [email or web address]  If you would like to schedule transportation through your [facility], please call the following number at least 2 days in advance of your appointment: [phone]   Rides for urgent appointments can also be made after hours by calling Member Services.  Call the [place] at [phone], at any time, 24 hours a day, 7 days a week. If you are in danger or need immediate medical attention call 911.  If you would like help to quit smoking, call 1-800-QUIT-NOW 212-199-2669) OR Espaol: 1-855-Djelo-Ya (2-707-867-5449) o para ms informacin haga clic aqu or Text READY to 200-400 to register via text  Leah Baker - following are the goals we discussed in your visit today:   Goals Addressed   None      Social Worker will follow up in 30 days .   Leah Baker, BSW, [place] Managed Medicaid Team  [phone]   Following is a copy of your plan of care:  There are no care plans that you recently modified to display for this patient.

## 2022-03-26 ENCOUNTER — Other Ambulatory Visit: Payer: Self-pay | Admitting: *Deleted

## 2022-03-26 NOTE — Patient Instructions (Signed)
Visit Information  Leah Baker was given information about Medicaid Managed Care team care coordination services as a part of their [place] Medicaid benefit. Leah Baker verbally consented to engagement with the [facility] Managed Care team.   If you are experiencing a medical emergency, please call 911 or report to your local emergency department or urgent care.   If you have a non-emergency medical problem during routine business hours, please contact your provider's office and ask to speak with a nurse.   For questions related to your [facility], please call: [phone] or visit the homepage here: [email or web address]  If you would like to schedule transportation through your [facility], please call the following number at least 2 days in advance of your appointment: [phone]   Rides for urgent appointments can also be made after hours by calling Member Services.  Call the [place] at [phone], at any time, 24 hours a day, 7 days a week. If you are in danger or need immediate medical attention call 911.  If you would like help to quit smoking, call 1-800-QUIT-NOW 620-483-7555) OR Espaol: 1-855-Djelo-Ya (1-275-170-0174) o para ms informacin haga clic aqu or Text READY to 200-400 to register via text  Leah Baker,   Please see education materials related to managing anxiety, heart disease and anemia provided by MyChart link.  Patient verbalizes understanding of instructions and care plan provided today and agrees to view in [place]. Active MyChart status and patient understanding of how to access instructions and care plan via MyChart confirmed with patient.     Telephone follow up appointment with Managed Medicaid care management team member scheduled for:05/28/22 @ 11:15am  Lurena Joiner RN, BSN [place] RN Care Coordinator   Following is a copy of your plan of care:  Care Plan : RN Care Manager Plan of Care  Updates made by Melissa Montane, RN since 03/26/2022 12:00 AM     Problem: Chronic Disease Management and Care Coordination Needs for Iron Deficiency Anemia, Internal hemorrhoid, Anxiety & Depression   Priority: High     Long-Range Goal: Development of Plan of Care for Chronic Disease Management and Care Coordination Needs (Iron Deficiency Anemia & Bleeding due to internal hemorroid; Anxiety & Depression)   Start Date: 07/24/2021  Expected End Date: 05/30/2022  Priority: High  Note:   Current Barriers:  Knowledge Deficits related to plan of care for management of Anxiety with Excessive Worry, Social Anxiety, and Depression: anxiety loss of energy/fatigue disturbed sleep decreased appetite and Iron Deficiency Anemia & Bleeding due to internal hemorrhoid  Care Coordination needs related to Lacks knowledge of community resource: Dental & Vision resources  Chronic Disease Management support and education needs related to Anxiety with Excessive Worry, Social Anxiety,, Depression: anxiety loss of energy/fatigue disturbed sleep decreased appetite, and Iron Deficiency Anemia  and Bleeding due to internal hemorroid  RNCM Clinical Goal(s):  RNCM will help patient verbalize understanding of plan for management of Anxiety, Depression, and Iron Deficiency Anemia & bleeding due to internal hemorrhoid as evidenced by providing education regarding these chronic diseases. verbalize basic understanding of Anxiety, Depression, and Iron Deficiency Anemia & bleeding due to internal hemorrhoid disease process and self health management plan as evidenced by improved management of these chronic diseases take all medications exactly as prescribed and will call provider for medication related questions as evidenced by medication compliance    attend all scheduled  medical appointments:  10/26 with [place] at [facility], 10/27 with Pharmacist, 11/9 with GYN, 11/14 with [place] Surgeon, and 11/20 with BSW; as evidenced by attending all scheduled appointments        demonstrate ongoing adherence to prescribed treatment plan for Anxiety, Depression, and Iron Deficiency Anemia & bleeding due to internal hemorrhoid as evidenced by patient being compliant with treatment plan. continue to work with Consulting civil engineer and/or Social Worker to address care management and care coordination needs related to Anxiety, Depression, and Iron Deficiency Anemia & Bleeding due to internal hemorrhoid  as evidenced by adherence to CM Team Scheduled appointments     work with Education officer, museum to address Lacks knowledge of community resource: Dental and Vision resources related to the management of Anxiety, Depression, and Iron Deficiency Anemia & bleeding due to internal hemorrhoid as evidenced by review of EMR and patient or Education officer, museum report     through collaboration with Consulting civil engineer, provider, and care team.   Interventions: Inter-disciplinary care team collaboration (see longitudinal plan of care) Evaluation of current treatment plan related to  self management and patient's adherence to plan as established by provider  Hypertension Interventions:  (Status:  Goal on track:  Yes.) Long Term Goal -PCP  Last practice recorded BP readings:  BP Readings from Last 3 Encounters:  02/24/22 118/76  02/19/22 135/87  01/22/22 (!) 132/92  Most recent eGFR/CrCl:  Lab Results  Component Value Date   EGFR 72 07/30/2020    No components found for: "CRCL"  Provided education to patient re: stroke prevention, s/s of heart attack and stroke Reviewed medications with patient and discussed importance of compliance Counseled on the importance of exercise goals with target of 150 minutes per week Reviewed scheduled/upcoming provider appointments including:  Advised to keep a journal of collected BP, take journal with her to  appointment on 03/28/22 Advised to attend all appointments: 03/13/22 [place] [facility], 04/15/22 with General Surgeon, 04/10/22 with GYN, 04/21/22 with BSW and 06/11/22 with PCP Discussed taking amlodipine 10 mg, advised patient to request refill prior to running out Discussed the importance of self care and relaxation techniques   Anemia/Bleeding:  (Status: Goal on Track (progressing): YES.) Long Term Goal  Assessment of understanding of anemia/bleeding disorder diagnosis Basic overview and discussion of anemia/bleeding disorder or acute disease state Review of most recent labs:  Lab Results  Component Value Date   WBC 6.8 02/19/2022   HGB 12.3 02/19/2022   HCT 36.7 02/19/2022   MCV 88.2 02/19/2022   PLT 412 (H) 02/19/2022     Lab Results  Component Value Date   IRON 159 02/19/2022   TIBC 508 (H) 02/19/2022   FERRITIN 60 02/19/2022     Medications reviewed - Counseled on importance of regular laboratory monitoring as directed by provider; - Advised to call provider or 911 if active bleeding or signs and symptoms of active bleeding occur; - recommended promotion of rest and energy-conserving measures to manage fatigue, such as balancing activity with periods of rest - encouraged dietary changes to increase dietary intake of iron, Vitamin T62 and folic acid as advised/prescribed - Assessed social determinant of health barriers;  Advised patient to report heavy bleeding( soaking a pad in an hour) to her provider Encouraged patient to continue eating iron rich foods, discussed examples Advised patient to follow up with General Surgeon on 04/15/22 for plan of care regarding internal hemorrhoids   Anxiety & Depression  (Status:  Goal on track:  Yes.)  Long Term Goal Evaluation of current treatment plan related to Anxiety and Depression, Lacks knowledge of community resource: Dental & Vision resources,  self-management and patient's adherence to plan as established by provider. Discussed plans  with patient for ongoing care management follow up and provided patient with direct contact information for care management team Evaluation of current treatment plan related to Anxiety & Depression and patient's adherence to plan as established by provider Advised patient to attend visit with Behavioral Health clinician on 03/27/22 Reviewed medications with patient and discussed use of antidepressant Provided patient with MyChart educational materials related to stress management Reviewed scheduled/upcoming provider appointments including  10/26 with [facility], 10/27 with Pharmacist, 11/9 with GYN, 11/14 with [place] Surgeon ,and 11/20 with BSW Provieded therapeutic listening  Patient Goals/Self-Care Activities: Take medications as prescribed   Attend all scheduled provider appointments Call pharmacy for medication refills 3-7 days in advance of running out of medications Call provider office for new concerns or questions  Work with the social worker to address care coordination needs and will continue to work with the clinical team to address health care and disease management related needs

## 2022-03-26 NOTE — Patient Outreach (Signed)
Medicaid Managed Care   Nurse Care Manager Note  03/26/2022 Name:  Leah Baker MRN:  [id number] [name]:  December 23, 1980  Leah Baker is an 41 y.o. year old female who is a primary patient of Leah Stain, MD.  The [facility] Managed Care Coordination team was consulted for assistance with:    HTN Anxiety Depression anemia  [name] was given information about Medicaid Managed Care Coordination team services today. [name] Patient agreed to services and verbal consent obtained.  Engaged with patient by telephone for follow up visit in response to provider referral for case management and/or care coordination services.   Assessments/Interventions:  Review of past medical history, allergies, medications, health status, including review of consultants reports, laboratory and other test data, was performed as part of comprehensive evaluation and provision of chronic care management services.  SDOH (Social Determinants of Health) assessments and interventions performed: SDOH Interventions    Flowsheet Row Patient Outreach Telephone from 03/26/2022 in [place] Patient Outreach Telephone from 02/21/2022 in [place] Patient Outreach Telephone from 07/24/2021 in [facility] [employer] Office Visit from 05/02/2021 in [place] Office Visit from 07/30/2020 in [place] Interventions -- -- Intervention Not Indicated -- --  Housing Interventions Other (Comment)  [Patient scheduled with BSW] -- Intervention Not Indicated -- --  Transportation Interventions -- Intervention Not Indicated Intervention Not Indicated -- --  Depression Interventions/Treatment  -- -- Medication Medication, Currently on Treatment Counseling  Financial Strain Interventions -- -- Intervention Not  Indicated -- --  Physical Activity Interventions -- -- Intervention Not Indicated -- --  Stress Interventions -- -- Intervention Not Indicated -- --  Social Connections Interventions -- -- Intervention Not Indicated -- --       Care Plan  Allergies  Allergen Reactions   Latex Itching and Rash    Medications Reviewed Today     Reviewed by Leah Montane, RN (Registered Nurse) on 03/26/22 at [place] List Status: <None>   Medication Order Taking? Sig Documenting Provider Last Dose Status Informant  acetaminophen (TYLENOL) 500 MG tablet [id number] Yes Take 1 tablet (500 mg total) by mouth every 6 (six) hours as needed. Leah Picket, MD Taking Active Self  amLODipine (NORVASC) 10 MG tablet [id number] Yes Take 1 tablet (10 mg total) by mouth daily. Leah Stain, MD Taking Active   aspirin-sod bicarb-citric acid (ALKA-SELTZER) 325 MG TBEF tablet [id number] No Take 325 mg by mouth every 6 (six) hours as needed.  Patient not taking: Reported on 03/26/2022   [provider] Not Taking Active   Blood Pressure Monitoring (BLOOD PRESSURE KIT) DEVI [id number] Yes Use to measure blood pressure Leah Stain, MD Taking Active   diclofenac Sodium (VOLTAREN) 1 % GEL [id number] Yes Apply 2 g topically 4 (four) times daily. Leah Stain, MD Taking Active   Ferrous Sulfate (IRON) 325 (65 Fe) MG TABS [id number] Yes Take by mouth in the morning and at bedtime. [provider] Taking Active Self           Med Note ([name] A   Fri Feb 21, 2022  9:20 AM)    fluticasone (FLONASE) 50 MCG/ACT nasal spray [id number] Yes Place 2 sprays into both nostrils daily. Leah Stain, MD Taking Active   hydrocortisone (ANUSOL-HC) 2.5 % rectal  cream [id number] Yes Place 1 application. rectally 2 (two) times daily. Leah Stain, MD Taking Active   Menthol-Camphor (TIGER [facility] ARTHRITIS RUB EX) [id number] Yes Apply 1 application topically daily as needed (knee pain). [provider] Taking Active Self  norethindrone (AYGESTIN) 5 MG tablet [id number] Yes Take 2 tablets (10 mg total) by mouth daily. Leah Jude, MD Taking Active   oxybutynin (DITROPAN) 5 MG tablet [id number] Yes Take 1 tablet (5 mg total) by mouth 3 (three) times daily. Leah Stain, MD Taking Active   psyllium (METAMUCIL) 58.6 % packet [id number] Yes Take 1 packet by mouth daily. [provider] Taking Active   sertraline (ZOLOFT) 100 MG tablet [id number] Yes Take 1 tablet (100 mg total) by mouth at bedtime. 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   Lymphoid hyperplasia 07/30/2020   Menorrhagia 11/27/2018   Rectal bleeding 11/26/2018   Liver lesion 09/09/2018   Hemorrhoid 11/29/2014   Iron deficiency anemia 05/04/2014   Mood disorder ([place]) 02/20/2014   Tears of meniscus and ACL of left knee 09/08/2013   Chronic pain of left knee 05/19/2013   Depression 05/14/2011   H/O: cesarean section 08/28/2010    Conditions to be addressed/monitored per PCP order:  HTN, Anxiety, Depression, and Anemia  Care Plan : RN Care Manager Plan of Care  Updates made by Leah Montane, RN since 03/26/2022 12:00 AM     Problem: Chronic Disease Management and Care Coordination Needs for Iron Deficiency Anemia, Internal hemorrhoid, Anxiety & Depression   Priority: High     Long-Range Goal: Development of Plan of Care for Chronic Disease Management and Care Coordination Needs (Iron Deficiency Anemia & Bleeding due to internal hemorroid; Anxiety & Depression)   Start Date: 07/24/2021  Expected End Date: 05/30/2022  Priority: High  Note:   Current Barriers:  Knowledge Deficits related  to plan of care for management of Anxiety with Excessive Worry, Social Anxiety, and Depression: anxiety loss of energy/fatigue disturbed sleep decreased appetite and Iron Deficiency Anemia & Bleeding due to internal hemorrhoid  Care Coordination needs related to Lacks knowledge of community resource: Dental & Vision resources  Chronic Disease Management support and education needs related to Anxiety with Excessive Worry, Social Anxiety,, Depression: anxiety loss of energy/fatigue disturbed sleep decreased appetite, and Iron Deficiency Anemia  and Bleeding due to internal hemorroid  RNCM Clinical Goal(s):  RNCM will help patient verbalize understanding of plan for management of Anxiety, Depression, and Iron Deficiency Anemia & bleeding due to internal hemorrhoid as evidenced by providing education regarding these chronic diseases. verbalize basic understanding of Anxiety, Depression, and Iron Deficiency Anemia & bleeding due to internal hemorrhoid disease process and self health management plan as evidenced by improved management of these chronic diseases take all medications exactly as prescribed and will call provider for medication related questions as evidenced by medication compliance    attend all scheduled medical appointments: 10/26 with [facility], 10/27 with Pharmacist, 11/9 with GYN, 11/14 with [place] Surgeon, and 11/20 with BSW; as evidenced by attending all scheduled appointments        demonstrate ongoing adherence to prescribed treatment plan  for Anxiety, Depression, and Iron Deficiency Anemia & bleeding due to internal hemorrhoid as evidenced by patient being compliant with treatment plan. continue to work with Consulting civil engineer and/or Social Worker to address care management and care coordination needs related to Anxiety, Depression, and Iron Deficiency Anemia & Bleeding due to internal hemorrhoid  as evidenced by adherence to CM Team Scheduled appointments     work with Education officer, museum to  address Lacks knowledge of community resource: Dental and Vision resources related to the management of Anxiety, Depression, and Iron Deficiency Anemia & bleeding due to internal hemorrhoid as evidenced by review of EMR and patient or Education officer, museum report     through collaboration with Consulting civil engineer, provider, and care team.   Interventions: Inter-disciplinary care team collaboration (see longitudinal plan of care) Evaluation of current treatment plan related to  self management and patient's adherence to plan as established by provider  Hypertension Interventions:  (Status:  Goal on track:  Yes.) Long Term Goal -PCP  Last practice recorded BP readings:  BP Readings from Last 3 Encounters:  02/24/22 118/76  02/19/22 135/87  01/22/22 (!) 132/92  Most recent eGFR/CrCl:  Lab Results  Component Value Date   EGFR 72 07/30/2020    No components found for: "CRCL"  Provided education to patient re: stroke prevention, s/s of heart attack and stroke Reviewed medications with patient and discussed importance of compliance Counseled on the importance of exercise goals with target of 150 minutes per week Reviewed scheduled/upcoming provider appointments including:  Advised to keep a journal of collected BP, take journal with her to appointment on 03/28/22 Advised to attend all appointments: 03/13/22 [place] [facility], 04/15/22 with General Surgeon, 04/10/22 with GYN, 04/21/22 with BSW and 06/11/22 with PCP Discussed taking amlodipine 10 mg, advised patient to request refill prior to running out Discussed the importance of self care and relaxation techniques   Anemia/Bleeding:  (Status: Goal on Track (progressing): YES.) Long Term Goal  Assessment of understanding of anemia/bleeding disorder diagnosis Basic overview and discussion of anemia/bleeding disorder or acute disease state Review of most recent labs:  Lab Results  Component Value Date   WBC 6.8 02/19/2022   HGB 12.3 02/19/2022   HCT 36.7  02/19/2022   MCV 88.2 02/19/2022   PLT 412 (H) 02/19/2022     Lab Results  Component Value Date   IRON 159 02/19/2022   TIBC 508 (H) 02/19/2022   FERRITIN 60 02/19/2022     Medications reviewed - Counseled on importance of regular laboratory monitoring as directed by provider; - Advised to call provider or 911 if active bleeding or signs and symptoms of active bleeding occur; - recommended promotion of rest and energy-conserving measures to manage fatigue, such as balancing activity with periods of rest - encouraged dietary changes to increase dietary intake of iron, Vitamin S92 and folic acid as advised/prescribed - Assessed social determinant of health barriers;  Advised patient to report heavy bleeding( soaking a pad in an hour) to her provider Encouraged patient to continue eating iron rich foods, discussed examples Advised patient to follow up with General Surgeon on 04/15/22 for plan of care regarding internal hemorrhoids   Anxiety & Depression  (Status:  Goal on track:  Yes.)  Long Term Goal Evaluation of current treatment plan related to Anxiety and Depression, Lacks knowledge of community resource: Dental & Vision resources,  self-management and patient's adherence to plan as established by provider. Discussed plans with patient for ongoing care management follow  up and provided patient with direct contact information for care management team Evaluation of current treatment plan related to Anxiety & Depression and patient's adherence to plan as established by provider Advised patient to attend visit with Behavioral Health clinician on 03/27/22 Reviewed medications with patient and discussed use of antidepressant Provided patient with MyChart educational materials related to stress management Reviewed scheduled/upcoming provider appointments including  10/26 with [facility], 10/27 with Pharmacist, 11/9 with GYN, 11/14 with [place] Surgeon ,and 11/20 with BSW Provieded therapeutic  listening  Patient Goals/Self-Care Activities: Take medications as prescribed   Attend all scheduled provider appointments Call pharmacy for medication refills 3-7 days in advance of running out of medications Call provider office for new concerns or questions  Work with the social worker to address care coordination needs and will continue to work with the clinical team to address health care and disease management related needs       Follow Up:  Patient agrees to Care Plan and Follow-up.  Plan: The Managed Medicaid care management team will reach out to the patient again over the next 60 days.  Date/time of next scheduled RN care management/care coordination outreach:  05/28/22 @ 11:15am  Lurena Joiner RN, BSN [place]  [employer] manager

## 2022-03-27 ENCOUNTER — Ambulatory Visit (INDEPENDENT_AMBULATORY_CARE_PROVIDER_SITE_OTHER): Payer: Medicaid Other | Admitting: Clinical

## 2022-03-27 DIAGNOSIS — F4323 Adjustment disorder with mixed anxiety and depressed mood: Secondary | ICD-10-CM | POA: Diagnosis not present

## 2022-03-28 ENCOUNTER — Ambulatory Visit: Payer: Medicaid Other | Attending: Critical Care Medicine | Admitting: Pharmacist

## 2022-03-28 ENCOUNTER — Encounter: Payer: Self-pay | Admitting: Pharmacist

## 2022-03-28 VITALS — BP 123/84

## 2022-03-28 DIAGNOSIS — I10 Essential (primary) hypertension: Secondary | ICD-10-CM

## 2022-03-28 NOTE — Progress Notes (Signed)
   S:     No chief complaint on file.  Leah Baker is a 41 y.o. female who presents for hypertension evaluation, education, and management.  PMH is significant for HTN, Hemorrhoid, anemia.  Patient was referred and last seen by Primary Care Provider, Dr. [name], on 01/22/2022. I saw her on 02/24/2022 and her BP was at goal.   Today, patient arrives in good spirits and presents without assistance. Denies, headache, swelling. Of note, her father had an MI and subsequent CABG x4 earlier this month. Understandably, her stress is high  Family/Social history:  -Fhx: kidney failure -Tobacco: former smoker -Alcohol: drinks on holidays only  Medication adherence is optimal. Patient has not taken BP medications today, usually takes it at night  Current antihypertensives include: Amlodipine 10 mg daily  Reported home BP readings:  SBP avg since 10/22: 134 mmHg. Range: 130 - 142 DBP avg since 10/22: 88 mmHg. Range: 80 - 92  Patient reported dietary habits:  -Patient is adherent with salt restrictions. -Caffeine: drinks one coffee daily, and energy drinks about 3 times a week.  Patient-reported exercise habits:  -Walks 30 minutes about 2-3 times daily.  O:  Vitals:   03/28/22 0941  BP: 123/84    Last 3 Office BP readings: BP Readings from Last 3 Encounters:  03/28/22 123/84  02/24/22 118/76  02/19/22 135/87   BMET    Component Value Date/Time   NA 136 02/19/2022 0859   NA 139 07/30/2020 0906   K 3.9 02/19/2022 0859   CL 108 02/19/2022 0859   CO2 22 02/19/2022 0859   GLUCOSE 97 02/19/2022 0859   BUN 12 02/19/2022 0859   BUN 12 07/30/2020 0906   CREATININE 0.94 02/19/2022 0859   CREATININE 0.83 05/03/2014 1628   CALCIUM 8.7 (L) 02/19/2022 0859   GFRNONAA >60 02/19/2022 0859   GFRAA 75 03/28/2020 0919   GFRAA >60 02/28/2019 1240   Renal function: CrCl cannot be calculated (Patient's most recent lab result is older than the maximum 21 days allowed.).  Clinical ASCVD:  No  The ASCVD Risk score (Arnett DK, et al., 2019) failed to calculate for the following reasons:   Cannot find a previous HDL lab   Cannot find a previous total cholesterol lab  A/P: Hypertension diagnosed currently at goal on current medications. BP goal < 130/80 mmHg. Medication adherence appears appropriate. Her home pressures are above goal, however, she is under more stress lately and she does not rest before taking her BP at home. I have encouraged her to do so for accurate readings.  -Continued Amlodipine 10 mg daily.  -F/u labs ordered - none -Counseled on lifestyle modifications for blood pressure control including reduced dietary sodium, increased exercise, adequate sleep. -Encouraged patient to check BP at home and bring log of readings to next visit. Counseled on proper use of home BP cuff.  -Patient encouraged to call and make an appointment if she notices continued high home pressures despite using proper measuring technique.    Results reviewed and written information provided.    Written patient instructions provided. Patient verbalized understanding of treatment plan.  Total time in face to face counseling 30 minutes.    Follow-up:  -Follow up with Pharmacist prn. -Dr. [name] in Jan.    Benard Halsted, PharmD, [place], [place] [phone]

## 2022-03-28 NOTE — Patient Instructions (Addendum)
[facility] [address] , [place] [address] [phone] (main office) [phone] ([place]enight's office)  [facility] (Caregiver Support) [email or web address]  

## 2022-03-31 NOTE — BH Specialist Note (Signed)
Error, rescheduled

## 2022-04-10 ENCOUNTER — Telehealth (INDEPENDENT_AMBULATORY_CARE_PROVIDER_SITE_OTHER): Payer: Medicaid Other | Admitting: Family Medicine

## 2022-04-10 ENCOUNTER — Ambulatory Visit: Payer: Medicaid Other | Admitting: Clinical

## 2022-04-10 ENCOUNTER — Encounter: Payer: Self-pay | Admitting: Family Medicine

## 2022-04-10 VITALS — BP 136/92

## 2022-04-10 DIAGNOSIS — F419 Anxiety disorder, unspecified: Secondary | ICD-10-CM | POA: Diagnosis not present

## 2022-04-10 DIAGNOSIS — N921 Excessive and frequent menstruation with irregular cycle: Secondary | ICD-10-CM

## 2022-04-10 DIAGNOSIS — I10 Essential (primary) hypertension: Secondary | ICD-10-CM

## 2022-04-10 MED ORDER — NORETHINDRONE ACETATE 5 MG PO TABS: 10.0000 mg | ORAL_TABLET | Freq: Every day | ORAL | 3 refills | Status: DC

## 2022-04-10 MED ORDER — AMLODIPINE BESYLATE 10 MG PO TABS: 10.0000 mg | ORAL_TABLET | Freq: Every day | ORAL | 2 refills | Status: DC

## 2022-04-10 MED ORDER — BUSPIRONE HCL 7.5 MG PO TABS: 7.5000 mg | ORAL_TABLET | Freq: Three times a day (TID) | ORAL | 2 refills | Status: DC

## 2022-04-10 NOTE — BH Specialist Note (Signed)
Integrated Behavioral Health via Telemedicine Visit  04/22/2022 Leah Baker [id number]  Number of [place] Clinician visits: 5-Fifth Visit  Session Start time: 3532   Session End time: 9924  Total time in minutes: 30  Referring Provider: Darron Doom, MD Patient/Family location: [place], [place] [facility]: [facility]  All persons participating in visit: [name] and [place]   Types of Service: Individual psychotherapy and Telephone visit  I connected with Leah Baker and/or Leah Baker's  n/a  via  Telephone or Video Enabled Telemedicine Application  (Video is Caregility application) and verified that I am speaking with the correct person using two identifiers. Discussed confidentiality: Yes   I discussed the limitations of telemedicine and the availability of in person appointments.  Discussed there is a possibility of technology failure and discussed alternative modes of communication if that failure occurs.  I discussed that engaging in this telemedicine visit, they consent to the provision of behavioral healthcare and the services will be billed under their insurance.  Patient and/or legal guardian expressed understanding and consented to Telemedicine visit: Yes   Presenting Concerns: Patient and/or family reports the following symptoms/concerns: Increased anxiety, attributed to life stress; feeling hopeful with upcoming medical appointment, starting to take Celexa to manage anxiety, as well as father receiving home health care and physical therapy now.  Duration of problem: Ongoing; Severity of problem: moderate  Patient and/or Family's Strengths/Protective Factors: Social connections, Concrete supports in place (healthy food, safe environments, etc.), Sense of purpose, and Physical Health (exercise, healthy diet, medication compliance, etc.)  Goals  Addressed: Patient will:  Reduce symptoms of: anxiety and stress    Demonstrate ability to: Increase healthy adjustment to current life circumstances  Progress towards Goals: Ongoing  Interventions: Interventions utilized:  Medication Monitoring and Supportive Reflection Standardized Assessments completed: GAD-7  Patient and/or Family Response: Patient agrees with treatment plan.   Assessment: Patient currently experiencing Adjustment disorder with mixed anxiety and depressed mood.   Patient may benefit from continued therapeutic interventions.  Plan: Follow up with behavioral health clinician on : Two weeks Behavioral recommendations:  -Continue using self-coping strategies to manage current life stress -Continue taking BH medications as prescribed -Continue plan to cook Thanksgiving meal with daughters; spend time with father and family on his birthday Referral(s): [place] (In Clinic)  I discussed the assessment and treatment plan with the patient and/or parent/guardian. They were provided an opportunity to ask questions and all were answered. They agreed with the plan and demonstrated an understanding of the instructions.   They were advised to call back or seek an in-person evaluation if the symptoms worsen or if the condition fails to improve as anticipated.  Garlan Fair, LCSW     01/21/2022   10:00 AM 11/21/2021    9:40 AM 09/17/2021    8:51 AM 07/24/2021   10:28 AM 07/24/2021    9:52 AM  Depression screen PHQ 2/9  Decreased Interest '1  2  2  '$ Down, Depressed, Hopeless '1 3 3  2  '$ PHQ - 2 Score '2 3 5  4  '$ Altered sleeping '2 3 2    '$ Tired, decreased energy '1 2 3  2  '$ Change in appetite  '2 2  3  '$ Feeling bad or failure about yourself  '1 2 1  1  '$ Trouble concentrating '1 2 1  2  '$ Moving slowly or fidgety/restless 1 0 0  0   Suicidal thoughts 0 0 0 0   PHQ-9 Score '8 14 14    '$ Difficult doing work/chores    Somewhat difficult       04/22/2022    11:05 AM 01/21/2022   10:02 AM 11/21/2021    9:41 AM 09/17/2021    8:51 AM  GAD 7 : Generalized Anxiety Score  Nervous, Anxious, on Edge '3 1 3 3  '$ Control/stop worrying '1 1 3 3  '$ Worry too much - different things '3 1 3 3  '$ Trouble relaxing '3 2 3 3  '$ Restless 1 0 1 2  Easily annoyed or irritable '1 1 1 1  '$ Afraid - awful might happen '1 1 3 3  '$ Total GAD 7 Score '13 7 17 '$ 18

## 2022-04-10 NOTE — Assessment & Plan Note (Signed)
Will add Buspar as she previously did not tolerate 150 mg dosing of Zoloft. See IBH specialist prn

## 2022-04-10 NOTE — Progress Notes (Signed)
I connected with  Leah Baker on 04/10/22 at  8:35 AM EST by telephone and verified that I am speaking with the correct person using two identifiers.   Vomiting and diarrhea since 3 AM today. Reports she has begun spotting since last visit when Aygestin was decreased; spots for a few days at a time.  Annabell Howells, RN 04/10/2022  8:38 AM

## 2022-04-10 NOTE — Assessment & Plan Note (Addendum)
Stable on Aygestin, minimal bleeding and ? Side effects--continue 10 mg for now.

## 2022-04-10 NOTE — Assessment & Plan Note (Signed)
Refilled her meds for her

## 2022-04-10 NOTE — Progress Notes (Signed)
GYNECOLOGY VIRTUAL VISIT ENCOUNTER NOTE  Provider location: [facility] [place] [facility]   Patient location: Home  I connected with Leah Baker on 04/10/22 at  8:35 AM EST by MyChart Video Encounter and verified that I am speaking with the correct person using two identifiers.   I discussed the limitations, risks, security and privacy concerns of performing an evaluation and management service virtually and the availability of in person appointments. I also discussed with the patient that there may be a patient responsible charge related to this service. The patient expressed understanding and agreed to proceed.   History:  Leah Baker is a 41 y.o. [phone] female being evaluated today for abnormal uterine bleeding. Has been on Aygestin. We decreased her meds from 15 mg to 10 mg. Having some spotting occasionally. Minimal side effects to include headache. ? Is this BP, BP meds or the Aygestin.  Has increased her BP med from '5mg'$ -10 mg. Needs refill. Her depression and anxiety is getting worse. Some stressors with her dad being hospitalized. She is not sleeping well. She denies any abnormal vaginal discharge, bleeding, pelvic pain or other concerns.       Past Medical History:  Diagnosis Date   Abnormal uterine bleeding (AUB)    Anemia    Phreesia 06/08/2020   Anxiety    Blood in stool last 2 years   Depression    Depression    Phreesia 06/08/2020   Hemorrhoids    History of blood transfusion 05/2019 2 units   iron transfusion given also   History of lower GI bleeding dec 2021 active problem   s/p  colonoscopy 09-01-2018,  hemorrhoids   IDA (iron deficiency anemia)    Inguinal lymphadenopathy    CT 09-14-2018  right side, followed by pcp   PONV (postoperative nausea and vomiting)    only happened once   Wears glasses    Wears glasses    Past Surgical History:  Procedure Laterality Date   BIOPSY  09/01/2018   Procedure: BIOPSY;  Surgeon:  Otis Brace, MD;  Location: [facility];  Service: Gastroenterology;;   CESAREAN SECTION  01/15/2002   '@[facility]'$    CESAREAN SECTION  03/17/2011   Procedure: CESAREAN SECTION;  Surgeon: Osborne Oman, MD;  Location: [facility];  Service: Gynecology;  Laterality: N/A;   COLONOSCOPY WITH PROPOFOL N/A 09/01/2018   Procedure: COLONOSCOPY WITH PROPOFOL;  Surgeon: Otis Brace, MD;  Location: [facility];  Service: Gastroenterology;  Laterality: N/A;   DILITATION & CURRETTAGE/HYSTROSCOPY WITH HYDROTHERMAL ABLATION N/A 08/22/2020   Procedure: DILATATION & CURETTAGE/HYSTEROSCOPY CERVICAL REPAIR;  Surgeon: [name], MD;  Location: [facility];  Service: Gynecology;  Laterality: N/A;   HYSTEROSCOPY WITH D & C  06-21-2009  dr constant '@[facility]'$    w/  REMOVAL IUD    INGUINAL LYMPH NODE BIOPSY Right 04/12/2020   Procedure: RIGHT INGUINAL LYMPH NODE EXCISIONAL BIOPSY;  Surgeon: Stark Klein, MD;  Location: [facility];  Service: General;  Laterality: Right;   IUD REMOVAL  06/21/2009   KNEE ARTHROSCOPY WITH ANTERIOR CRUCIATE LIGAMENT (ACL) REPAIR Left 09-08-2013   dr [name]  '@[facility]'$    w/  meniscal repair   MULTIPLE TOOTH EXTRACTIONS  2012   RECTAL EXAM UNDER ANESTHESIA N/A 11/18/2018   Procedure: ANORECTAL EXAM UNDER ANESTHESIA;  Surgeon: Ileana Roup, MD;  Location: [facility];  Service: General;  Laterality: N/A;   TUBAL LIGATION Bilateral 2012   The following portions of  the patient's history were reviewed and updated as appropriate: allergies, current medications, past family history, past medical history, past social history, past surgical history and problem list.   Health Maintenance:  Normal pap and negative HRHPV on 11/21/2021.  Normal mammogram on 02/13/2022.   Review of Systems:  Pertinent items noted in HPI and remainder of comprehensive ROS otherwise negative.  Physical Exam:  136/92 General:  Alert, oriented and cooperative. Patient appears to be in no acute  distress.  Mental Status: Normal mood and affect. Normal behavior. Normal judgment and thought content.   Respiratory: Normal respiratory effort, no problems with respiration noted  Rest of physical exam deferred due to type of encounter  Labs and Imaging No results found for this or any previous visit (from the past 336 hour(s)). No results found.     Assessment and Plan:     Problem List Items Addressed This Visit       Unprioritized   Menorrhagia    Stable on Aygestin, minimal bleeding and ? Side effects--continue 10 mg for now.      Relevant Medications   norethindrone (AYGESTIN) 5 MG tablet   Anxiety    Will add Buspar as she previously did not tolerate 150 mg dosing of Zoloft. See [facility]BH specialist prn       Relevant Medications   busPIRone (BUSPAR) 7.5 MG tablet   Primary hypertension - Primary    Refilled her meds for her      Relevant Medications   amLODipine (NORVASC) 10 MG tablet         I discussed the assessment and treatment plan with the patient. The patient was provided an opportunity to ask questions and all were answered. The patient agreed with the plan and demonstrated an understanding of the instructions.   The patient was advised to call back or seek an in-person evaluation/go to the ED if the symptoms worsen or if the condition fails to improve as anticipated.  I provided 11 minutes of face-to-face time during this encounter.  Return in about 4 weeks (around 05/08/2022) for virtual.   [name], [place] for [facility], [place]

## 2022-04-21 ENCOUNTER — Other Ambulatory Visit: Payer: Medicaid Other

## 2022-04-21 NOTE — Patient Outreach (Signed)
Medicaid Managed Care Social Work Note  04/21/2022 Name:  Leah Baker MRN:  [id number] [name]:  1980-11-30  Leah Baker is an 41 y.o. year old female who is a primary patient of [name] Burnett Harry, MD.  The Medicaid Managed Care Coordination team was consulted for assistance with:  Community Resources    Ms. Pesch was given information about Medicaid Managed Care Coordination team services today. [name] Patient agreed to services and verbal consent obtained.  Engaged with patient  for by telephone forfollow up visit in response to referral for case management and/or care coordination services.   Assessments/Interventions:  Review of past medical history, allergies, medications, health status, including review of consultants reports, laboratory and other test data, was performed as part of comprehensive evaluation and provision of chronic care management services.  SDOH: (Social Determinant of Health) assessments and interventions performed: SDOH Interventions    Flowsheet Row Patient Outreach Telephone from 03/26/2022 in [place] Patient Outreach Telephone from 02/21/2022 in [place] Patient Outreach Telephone from 07/24/2021 in [facility] Office Visit from 05/02/2021 in [place] Office Visit from 07/30/2020 in [place] Interventions -- -- Intervention Not Indicated -- --  Housing Interventions Other (Comment)  [Patient scheduled with BSW] -- Intervention Not Indicated -- --  Transportation Interventions -- Intervention Not Indicated Intervention Not Indicated -- --  Depression Interventions/Treatment  -- -- Medication Medication, Currently on Treatment Counseling  Financial Strain Interventions -- -- Intervention Not Indicated -- --  Physical  Activity Interventions -- -- Intervention Not Indicated -- --  Stress Interventions -- -- Intervention Not Indicated -- --  Social Connections Interventions -- -- Intervention Not Indicated -- --     BSW completed a telephone outreach with patient. Patient states everything is going well and no resources are needed at this time.  Advanced Directives Status:  Not addressed in this encounter.  Care Plan                 Allergies  Allergen Reactions   Latex Itching and Rash    Medications Reviewed Today     Reviewed by Annabell Howells, RN (Registered Nurse) on 04/10/22 at [phone]  Med List Status: <None>   Medication Order Taking? Sig Documenting Provider Last Dose Status Informant  acetaminophen (TYLENOL) 500 MG tablet [id number] Yes Take 1 tablet (500 mg total) by mouth every 6 (six) hours as needed. Chase Picket, MD Taking Active Self  amLODipine (NORVASC) 10 MG tablet [id number] Yes Take 1 tablet (10 mg total) by mouth daily. Elsie Stain, MD Taking Active   aspirin-sod bicarb-citric acid (ALKA-SELTZER) 325 MG TBEF tablet [id number] Yes Take 325 mg by mouth every 6 (six) hours as needed. [provider] Taking Active   Blood Pressure Monitoring (BLOOD PRESSURE KIT) DEVI [id number] Yes Use to measure blood pressure Elsie Stain, MD Taking Active   diclofenac Sodium (VOLTAREN) 1 % GEL [id number] Yes Apply 2 g topically 4 (four) times daily. Elsie Stain, MD Taking Active   Ferrous Sulfate (IRON) 325 (65 Fe) MG TABS [id number] Yes Take by mouth in the morning and at bedtime. [provider] Taking Active Self           Med Note (ROBB, MELANIE A   Fri Feb 21, 2022  9:20 AM)  fluticasone (FLONASE) 50 MCG/ACT nasal spray [id number] Yes Place 2 sprays into both nostrils daily. Elsie Stain, MD Taking Active   hydrocortisone (ANUSOL-HC) 2.5 % rectal cream [id number] Yes Place 1 application. rectally 2 (two) times daily. Elsie Stain, MD Taking Active    Menthol-Camphor (TIGER [facility] ARTHRITIS RUB EX) [id number] Yes Apply 1 application topically daily as needed (knee pain). [provider] Taking Active Self  norethindrone (AYGESTIN) 5 MG tablet [id number] Yes Take 2 tablets (10 mg total) by mouth daily. [name], MD Taking Active   oxybutynin (DITROPAN) 5 MG tablet [id number] Yes Take 1 tablet (5 mg total) by mouth 3 (three) times daily. Elsie Stain, MD Taking Active   psyllium (METAMUCIL) 58.6 % packet [id number] Yes Take 1 packet by mouth daily. [provider] Taking Active   sertraline (ZOLOFT) 100 MG tablet [id number] Yes Take 1 tablet (100 mg total) by mouth at bedtime. 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   Follow up:  Patient agrees to Care Plan and Follow-up.  Plan: The Managed Medicaid care management team will reach out to the patient again over the next 60 days.  Date/time of next scheduled Social Work care management/care coordination outreach:  06/23/22  Mickel Fuchs, Arita Miss, [place] Medicaid Team   [phone]

## 2022-04-21 NOTE — Patient Instructions (Signed)
Visit Information  Leah Baker was given information about Medicaid Managed Care team care coordination services as a part of their [place] Medicaid benefit. Leah Baker verbally consented to engagement with the [facility] Managed Care team.   If you are experiencing a medical emergency, please call 911 or report to your local emergency department or urgent care.   If you have a non-emergency medical problem during routine business hours, please contact your provider's office and ask to speak with a nurse.   For questions related to your [facility] please call: [phone] or visit the homepage here: [email or web address]  If you would like to schedule transportation through your [facility], please call the following number at least 2 days in advance of your appointment: [phone]   Rides for urgent appointments can also be made after hours by calling Member Services.  Call the [place] at [phone], at any time, 24 hours a day, 7 days a week. If you are in danger or need immediate medical attention call 911.  If you would like help to quit smoking, call 1-800-QUIT-NOW 559-883-7604) OR Espaol: 1-855-Djelo-Ya (8-110-315-9458) o para ms informacin haga clic aqu or Text READY to 200-400 to register via text  Leah Baker - following are the goals we discussed in your visit today:   Goals Addressed   None      Social Worker will follow up on 06/25/21.   Leah Baker, BSW, [place] Managed Medicaid Team  [phone]   Following is a copy of your plan of care:  There are no care plans that you recently modified to display for this patient.

## 2022-04-22 ENCOUNTER — Ambulatory Visit (INDEPENDENT_AMBULATORY_CARE_PROVIDER_SITE_OTHER): Payer: Medicaid Other | Admitting: Clinical

## 2022-04-22 DIAGNOSIS — F4323 Adjustment disorder with mixed anxiety and depressed mood: Secondary | ICD-10-CM

## 2022-04-22 NOTE — BH Specialist Note (Signed)
Error; pt rescheduled due to unforeseen circumstances.

## 2022-05-06 ENCOUNTER — Ambulatory Visit: Payer: Medicaid Other | Admitting: Clinical

## 2022-05-08 NOTE — BH Specialist Note (Signed)
Error; pt cancelled.

## 2022-05-14 ENCOUNTER — Encounter (HOSPITAL_COMMUNITY): Payer: Self-pay | Admitting: Emergency Medicine

## 2022-05-14 ENCOUNTER — Telehealth (HOSPITAL_COMMUNITY): Payer: Self-pay | Admitting: Family Medicine

## 2022-05-14 ENCOUNTER — Other Ambulatory Visit: Payer: Self-pay

## 2022-05-14 ENCOUNTER — Ambulatory Visit (HOSPITAL_COMMUNITY)
Admission: EM | Admit: 2022-05-14 | Discharge: 2022-05-14 | Disposition: A | Discharge status: Home / Self Care | Payer: Medicaid Other | Attending: Family Medicine | Admitting: Family Medicine

## 2022-05-14 DIAGNOSIS — J069 Acute upper respiratory infection, unspecified: Secondary | ICD-10-CM | POA: Diagnosis not present

## 2022-05-14 LAB — POC INFLUENZA A AND B ANTIGEN (URGENT CARE ONLY)
INFLUENZA A ANTIGEN, POC: NEGATIVE
INFLUENZA B ANTIGEN, POC: NEGATIVE

## 2022-05-14 MED ORDER — PROMETHAZINE-DM 6.25-15 MG/5ML PO SYRP: 5.0000 mL | ORAL_SOLUTION | Freq: Four times a day (QID) | ORAL | 0 refills | Status: DC | PRN

## 2022-05-14 MED ORDER — ACETAMINOPHEN 325 MG PO TABS
ORAL_TABLET | ORAL | Status: AC
  Filled 2022-05-14: qty 3

## 2022-05-14 MED ORDER — HYDROCODONE BIT-HOMATROP MBR 5-1.5 MG/5ML PO SOLN: 5.0000 mL | Freq: Four times a day (QID) | ORAL | 0 refills | Status: DC | PRN

## 2022-05-14 MED ORDER — ACETAMINOPHEN 325 MG PO TABS
975.0000 mg | ORAL_TABLET | Freq: Once | ORAL | Status: AC
  Administered 2022-05-14: 975 mg via ORAL

## 2022-05-14 NOTE — Discharge Instructions (Signed)
Be aware, your cough medication may cause drowsiness. Please do not drive, operate heavy machinery or make important decisions while on this medication as it may cloud your judgement.

## 2022-05-14 NOTE — ED Provider Notes (Signed)
[place]   [id number] 05/14/22 Arrival Time: 8850  ASSESSMENT & PLAN:  1. Viral URI with cough    Discussed typical duration of likely viral illness. Rapid flu negative. OTC symptom care as needed.  Discharge Medication List as of 05/14/2022  1:11 PM     START taking these medications   Details  HYDROcodone bit-homatropine (HYCODAN) 5-1.5 MG/5ML syrup Take 5 mLs by mouth every 6 (six) hours as needed for cough., Starting Wed 05/14/2022, Normal         Follow-up Information     Elsie Stain, MD.   Specialty: Pulmonary Disease Why: If worsening or failing to improve as anticipated. Contact information: [address] [place] [address] [phone]                  Discharge Instructions      Be aware, your cough medication may cause drowsiness. Please do not drive, operate heavy machinery or make important decisions while on this medication as it may cloud your judgement.      Reviewed expectations re: course of current medical issues. Questions answered. Outlined signs and symptoms indicating need for more acute intervention. Understanding verbalized. After Visit Summary given.   SUBJECTIVE: History from: Patient. Leah Baker is a 41 y.o. female. Reports: body aches, cough, sore throat; abrupt onset; x 2-3 days. Questions fever. Normal PO intake without n/v/d. Tylenol with minimal relief.   OBJECTIVE:  Vitals:   05/14/22 1233  BP: 117/82  Pulse: (!) 109  Resp: 20  Temp: 98.7 F (37.1 C)  TempSrc: Oral  SpO2: 98%    Slight tachycardia noted.  General appearance: alert; no distress Eyes: PERRLA; EOMI; conjunctiva normal HENT: [place]; AT; with nasal congestion Neck: supple  Lungs: speaks full sentences without difficulty; unlabored; active cough; clear bilat Extremities: no edema Skin: warm and dry Neurologic: normal gait Psychological: alert and cooperative; normal mood and affect  Labs:  Labs  Reviewed  POC INFLUENZA A AND B ANTIGEN (URGENT CARE ONLY)    Allergies  Allergen Reactions   Latex Itching and Rash    Past Medical History:  Diagnosis Date   Abnormal uterine bleeding (AUB)    Anemia    Phreesia 06/08/2020   Anxiety    Blood in stool last 2 years   Depression    Depression    Phreesia 06/08/2020   Hemorrhoids    History of blood transfusion 05/2019 2 units   iron transfusion given also   History of lower GI bleeding dec 2021 active problem   s/p  colonoscopy 09-01-2018,  hemorrhoids   IDA (iron deficiency anemia)    Inguinal lymphadenopathy    CT 09-14-2018  right side, followed by pcp   PONV (postoperative nausea and vomiting)    only happened once   Wears glasses    Wears glasses    Social History   Socioeconomic History   Marital status: Significant Other    Spouse name: Not on file   Number of children: Not on file   Years of education: Not on file   Highest education level: Not on file  Occupational History   Not on file  Tobacco Use   Smoking status: Former    Years: 5.00    Types: Cigarettes    Quit date: 08/22/2008    Years since quitting: 13.7   Smokeless tobacco: Never  Vaping Use   Vaping Use: Never used  Substance and Sexual Activity   Alcohol  use: Not Currently   Drug use: Not Currently    Comment: per pt younger in college marijuana, none since   Sexual activity: Yes    Birth control/protection: Surgical  Other Topics Concern   Not on file  Social History Narrative   Not on file   Social Determinants of Health   Financial Resource Strain: Low Risk  (07/24/2021)   Overall Financial Resource Strain (CARDIA)    Difficulty of Paying Living Expenses: Not hard at all  Food Insecurity: No Food Insecurity (04/21/2022)   Hunger Vital Sign    Worried About Running Out of Food in the Last Year: Never true    Ran Out of Food in the Last Year: Never true  Transportation Needs: No Transportation Needs (04/21/2022)   PRAPARE -  Hydrologist (Medical): No    Lack of Transportation (Non-Medical): No  Physical Activity: Insufficiently Active (07/24/2021)   Exercise Vital Sign    Days of Exercise per Week: 2 days    Minutes of Exercise per Session: 20 min  Stress: Stress Concern Present (07/24/2021)   [place]    Feeling of Stress : Very much  Social Connections: Moderately Integrated (07/24/2021)   Social Connection and Isolation Panel [NHANES]    Frequency of Communication with Friends and Family: More than three times a week    Frequency of Social Gatherings with Friends and Family: More than three times a week    Attends Religious Services: More than 4 times per year    Active Member of [employer] or Organizations: No    Attends Archivist Meetings: Never    Marital Status: Living with partner  Intimate Partner Violence: Not on file   Family History  Problem Relation Age of Onset   Cancer Mother    Kidney failure Mother    Healthy Father    Breast cancer Neg Hx    Past Surgical History:  Procedure Laterality Date   BIOPSY  09/01/2018   Procedure: BIOPSY;  Surgeon: Otis Brace, MD;  Location: [facility];  Service: Gastroenterology;;   CESAREAN SECTION  01/15/2002   '@[facility]'$    CESAREAN SECTION  03/17/2011   Procedure: CESAREAN SECTION;  Surgeon: Osborne Oman, MD;  Location: [facility];  Service: Gynecology;  Laterality: N/A;   COLONOSCOPY WITH PROPOFOL N/A 09/01/2018   Procedure: COLONOSCOPY WITH PROPOFOL;  Surgeon: Otis Brace, MD;  Location: [place];  Service: Gastroenterology;  Laterality: N/A;   DILITATION & CURRETTAGE/HYSTROSCOPY WITH HYDROTHERMAL ABLATION N/A 08/22/2020   Procedure: DILATATION & CURETTAGE/HYSTEROSCOPY CERVICAL REPAIR;  Surgeon: [name], MD;  Location: [facility];  Service: Gynecology;  Laterality: N/A;   HYSTEROSCOPY WITH D & C  06-21-2009  dr constant  '@[facility]'$    w/  REMOVAL IUD    INGUINAL LYMPH NODE BIOPSY Right 04/12/2020   Procedure: RIGHT INGUINAL LYMPH NODE EXCISIONAL BIOPSY;  Surgeon: Stark Klein, MD;  Location: [place];  Service: General;  Laterality: Right;   IUD REMOVAL  06/21/2009   KNEE ARTHROSCOPY WITH ANTERIOR CRUCIATE LIGAMENT (ACL) REPAIR Left 09-08-2013   dr [name]  '@[facility]'$    w/  meniscal repair   MULTIPLE TOOTH EXTRACTIONS  2012   RECTAL EXAM UNDER ANESTHESIA N/A 11/18/2018   Procedure: ANORECTAL EXAM UNDER ANESTHESIA;  Surgeon: Ileana Roup, MD;  Location: [place];  Service: General;  Laterality: N/A;   TUBAL LIGATION Bilateral 2012     Najia Hurlbutt,  Aaron Edelman, MD 05/14/22 [phone]

## 2022-05-14 NOTE — Telephone Encounter (Signed)
[facility] out of Hycodan.  Alternative sent. Meds ordered this encounter  Medications   promethazine-dextromethorphan (PROMETHAZINE-DM) 6.25-15 MG/5ML syrup    Sig: Take 5 mLs by mouth 4 (four) times daily as needed for cough.    Dispense:  118 mL    Refill:  0

## 2022-05-14 NOTE — Telephone Encounter (Signed)
Pharmacy call. Out of Hycodan. Cancelled. [facility]ent to [facility] [address].

## 2022-05-14 NOTE — ED Notes (Signed)
Sample for flu obtained, labeled at bedside

## 2022-05-14 NOTE — ED Triage Notes (Signed)
Patient reports on Sunday, started with symptoms.  Patient complains sore throat, hurts with swallowing, back and chest hurts with coughing and fever ( reported as 102 during the night)  Patient has used tylenol, theraflu cough and flu, alka seltzer cold and flu and cough drops

## 2022-05-15 ENCOUNTER — Ambulatory Visit (HOSPITAL_COMMUNITY): Payer: Medicaid Other

## 2022-05-20 ENCOUNTER — Ambulatory Visit: Payer: Medicaid Other | Admitting: Clinical

## 2022-05-28 ENCOUNTER — Other Ambulatory Visit: Payer: Medicaid Other | Admitting: *Deleted

## 2022-05-28 NOTE — Patient Instructions (Signed)
Visit Information  [name]  - as a part of your Medicaid benefit, you are eligible for care management and care coordination services at no cost or copay. I was unable to reach you by phone today but would be happy to help you with your health related needs. Please feel free to call me @ [phone].   A member of the Managed Medicaid care management team will reach out to you again over the next 14 days.   Lurena Joiner RN, BSN [place]  [employer] manager

## 2022-05-28 NOTE — Patient Outreach (Signed)
  Medicaid Managed Care   Unsuccessful Attempt Note   05/28/2022 Name: Leah Baker MRN: [id number] [name]: March 25, 1981  Referred by: Elsie Stain, MD Reason for referral : High Risk Managed Medicaid (Unsuccessful RNCM follow up telephone outreach)   An unsuccessful telephone outreach was attempted today. The patient was referred to the case management team for assistance with care management and care coordination.    Follow Up Plan: A HIPAA compliant phone message was left for the patient providing contact information and requesting a return call. and The Managed Medicaid care management team will reach out to the patient again over the next 14 days.    Lurena Joiner RN, BSN [place]  [employer] manager

## 2022-06-10 ENCOUNTER — Ambulatory Visit (INDEPENDENT_AMBULATORY_CARE_PROVIDER_SITE_OTHER): Payer: Medicaid Other | Admitting: Physician Assistant

## 2022-06-10 ENCOUNTER — Encounter: Payer: Self-pay | Admitting: Physician Assistant

## 2022-06-10 ENCOUNTER — Ambulatory Visit: Payer: Medicaid Other | Admitting: Physician Assistant

## 2022-06-10 VITALS — BP 125/88 | HR 94 | Ht 61.0 in | Wt 170.8 lb

## 2022-06-10 DIAGNOSIS — B9689 Other specified bacterial agents as the cause of diseases classified elsewhere: Secondary | ICD-10-CM

## 2022-06-10 DIAGNOSIS — J028 Acute pharyngitis due to other specified organisms: Secondary | ICD-10-CM

## 2022-06-10 MED ORDER — BENZONATATE 200 MG PO CAPS: 200.0000 mg | ORAL_CAPSULE | Freq: Two times a day (BID) | ORAL | 0 refills | Status: DC | PRN

## 2022-06-10 MED ORDER — AZITHROMYCIN 250 MG PO TABS: ORAL_TABLET | ORAL | 0 refills | Status: DC

## 2022-06-10 MED ORDER — PROMETHAZINE-DM 6.25-15 MG/5ML PO SYRP: 5.0000 mL | ORAL_SOLUTION | Freq: Four times a day (QID) | ORAL | 0 refills | Status: DC | PRN

## 2022-06-10 MED ORDER — PREDNISONE 20 MG PO TABS: ORAL_TABLET | ORAL | 0 refills | Status: AC

## 2022-06-10 MED ORDER — CETIRIZINE HCL 10 MG PO TABS: 10.0000 mg | ORAL_TABLET | Freq: Every day | ORAL | 11 refills | Status: AC

## 2022-06-10 NOTE — Progress Notes (Signed)
Established Patient Office Visit  Subjective   Patient ID: Leah Baker, female    [name]: 20-Mar-1981  Age: 42 y.o. MRN: [id number]  Chief Complaint  Patient presents with   Cough    Patient c/o persistent coughing for a couple of weeks. Leah Baker is also experiencing drainaging I both ears and fever at night, last night fever of 101 to tylenol 500 mg taken    States that Leah Baker was seen in UC  on 05/14/22.  Note from that visit:  SUBJECTIVE: History from: Patient. Leah Baker is a 42 y.o. female. Reports: body aches, cough, sore throat; abrupt onset; x 2-3 days. Questions fever. Normal PO intake without n/v/d. Tylenol with minimal relief.    ASSESSMENT & PLAN:   1. Viral URI with cough    Discussed typical duration of likely viral illness. Rapid flu negative. OTC symptom care as needed.   START taking these medications   Details HYDROcodone bit-homatropine (HYCODAN) 5-1.5 MG/5ML syrup Take 5 mLs by mouth every 6 (six) hours as needed for cough., Starting Wed 05/14/2022, Normal   States today that Leah Baker still continues to have productive cough, states that the cough is keeping her awake at night, causing her to have muscle aches. Endorses left ear pain, sore throat States that the cough medication that was prescribed in the urgent care was helpful but Leah Baker has ran out of this.  States that Leah Baker has been trying over-the-counter Mucinex without any relief.  States that Leah Baker did have a fever of 102 two days ago.  States that Leah Baker has lost her sense of smell and taste.  States that Leah Baker did take a home COVID test multiple times and they were all negative.   Past Medical History:  Diagnosis Date   Abnormal uterine bleeding (AUB)    Anemia    [employer]rees[employer] 06/08/2020   Anxiety    Blood in stool last 2 years   Depression    Depression    [employer]reesia 06/08/2020   Hemorrhoids    History of blood transfusion 05/2019 2 units   iron transfusion given also   History of lower GI bleeding dec 2021  active problem   s/p  colonoscopy 09-01-2018,  hemorrhoids   IDA (iron deficiency anemia)    Inguinal lymphadenopathy    CT 09-14-2018  right side, followed by pcp   PONV (postoperative nausea and vomiting)    only happened once   Wears glasses    Wears glasses    Social History   Socioeconomic History   Marital status: Significant Other    Spouse name: Not on file   Number of children: Not on file   Years of education: Not on file   Highest education level: Not on file  Occupational History   Not on file  Tobacco Use   Smoking status: Former    Years: 5.00    Types: Cigarettes    Quit date: 08/22/2008    Years since quitting: 13.8   Smokeless tobacco: Never  Vaping Use   Vaping Use: Never used  Substance and Sexual Activity   Alcohol use: Not Currently   Drug use: Not Currently    Comment: per pt younger in college marijuana, none since   Sexual activity: Yes    Birth control/protection: Surgical  Other Topics Concern   Not on file  Social History Narrative   Not on file   Social Determinants of Health   Financial Resource Strain: Low Risk  (07/24/2021)  Overall Financial Resource Strain (CARDIA)    Difficulty of Paying Living Expenses: Not hard at all  Food Insecurity: No Food Insecurity (04/21/2022)   Hunger Vital Sign    Worried About Running Out of Food in the Last Year: Never true    Ran Out of Food in the Last Year: Never true  Transportation Needs: No Transportation Needs (04/21/2022)   PRAPARE - Hydrologist (Medical): No    Lack of Transportation (Non-Medical): No  Physical Activity: Insufficiently Active (07/24/2021)   Exercise Vital Sign    Days of Exercise per Week: 2 days    Minutes of Exercise per Session: 20 min  Stress: Stress Concern Present (07/24/2021)   [place]    Feeling of Stress : Very much  Social Connections: Moderately Integrated  (07/24/2021)   Social Connection and Isolation Panel [NHANES]    Frequency of Communication with Friends and Family: More than three times a week    Frequency of Social Gatherings with Friends and Family: More than three times a week    Attends Religious Services: More than 4 times per year    Active Member of [employer] or Organizations: No    Attends Archivist Meetings: Never    Marital Status: Living with partner  Intimate Partner Violence: Not on file   Family History  Problem Relation Age of Onset   Cancer Mother    Kidney failure Mother    Healthy Father    Breast cancer Neg Hx    Allergies  Allergen Reactions   Latex Itching and Rash    Review of Systems  Constitutional:  Positive for chills and fever.  HENT:  Positive for congestion, ear pain and sore throat. Negative for sinus pain.   Eyes: Negative.   Respiratory:  Positive for cough and sputum production. Negative for shortness of breath and wheezing.   Cardiovascular:  Negative for chest pain.  Gastrointestinal: Negative.   Genitourinary: Negative.   Musculoskeletal:  Positive for myalgias.  Skin: Negative.   Neurological: Negative.   Endo/Heme/Allergies: Negative.   Psychiatric/Behavioral: Negative.        Objective:     BP 125/88 (BP Location: Left Arm, Patient Position: Sitting, Cuff Size: Normal)   Pulse 94   Ht '5\' 1"'$  (1.549 m)   Wt 170 lb 12.8 oz (77.5 kg)   SpO2 98%   BMI 32.27 kg/m  BP Readings from Last 3 Encounters:  06/10/22 125/88  05/14/22 117/82  04/10/22 (!) 136/92   Wt Readings from Last 3 Encounters:  06/10/22 170 lb 12.8 oz (77.5 kg)  02/19/22 165 lb 1.6 oz (74.9 kg)  01/22/22 165 lb 12.8 oz (75.2 kg)      Physical Exam Vitals and nursing note reviewed.  Constitutional:      Appearance: Normal appearance.  HENT:     Head: Normocephalic and atraumatic.     Salivary Glands: Right salivary gland is diffusely enlarged and tender. Left salivary gland is diffusely enlarged  and tender.     Right Ear: Tympanic membrane, ear canal and external ear normal.     Left Ear: Tympanic membrane is bulging.     Nose:     Right Turbinates: Enlarged and swollen.     Left Turbinates: Enlarged and swollen.     Right Sinus: No maxillary sinus tenderness or frontal sinus tenderness.     Left Sinus: No maxillary sinus tenderness or frontal sinus tenderness.  Mouth/Throat:     Lips: Pink.     Mouth: Mucous membranes are moist.     Pharynx: Posterior oropharyngeal erythema present.     Tonsils: 1+ on the right. 1+ on the left.  Eyes:     Extraocular Movements: Extraocular movements intact.     Conjunctiva/sclera: Conjunctivae normal.     Pupils: Pupils are equal, round, and reactive to light.  Cardiovascular:     Rate and Rhythm: Normal rate and regular rhythm.     Pulses: Normal pulses.     Heart sounds: Normal heart sounds.  Pulmonary:     Effort: Pulmonary effort is normal.     Breath sounds: Normal breath sounds.  Musculoskeletal:        General: Normal range of motion.  Lymphadenopathy:     Cervical: Cervical adenopathy present.  Skin:    General: Skin is warm and dry.  Neurological:     General: No focal deficit present.     Mental Status: Leah Baker is alert and oriented to person, place, and time.  Psychiatric:        Mood and Affect: Mood normal.        Behavior: Behavior normal.        Thought Content: Thought content normal.        Judgment: Judgment normal.       Assessment & Plan:   Problem List Items Addressed This Visit   None Visit Diagnoses     Acute bacterial pharyngitis    -  Primary   Relevant Medications   promethazine-dextromethorphan (PROMETHAZINE-DM) 6.25-15 MG/5ML syrup   predniSONE (DELTASONE) 20 MG tablet   azithromycin (ZITHROMAX) 250 MG tablet   benzonatate (TESSALON) 200 MG capsule   cetirizine (ZYRTEC ALLERGY) 10 MG tablet      1. Acute bacterial pharyngitis Trial prednisone taper, azithromycin, Zyrtec, Tessalon Perles,  promethazine DM.  Patient education given on supportive care, red flags given for prompt reevaluation. - promethazine-dextromethorphan (PROMETHAZINE-DM) 6.25-15 MG/5ML syrup; Take 5 mLs by mouth 4 (four) times daily as needed for cough.  Dispense: 118 mL; Refill: 0 - predniSONE (DELTASONE) 20 MG tablet; Take 3 tablets (60 mg total) by mouth daily with breakfast for 2 days, THEN 2 tablets (40 mg total) daily with breakfast for 2 days, THEN 1 tablet (20 mg total) daily with breakfast for 2 days, THEN 0.5 tablets (10 mg total) daily with breakfast for 2 days.  Dispense: 13 tablet; Refill: 0 - azithromycin (ZITHROMAX) 250 MG tablet; Take 2 tabs PO day 1, then take 1 tab PO once daily  Dispense: 6 tablet; Refill: 0 - benzonatate (TESSALON) 200 MG capsule; Take 1 capsule (200 mg total) by mouth 2 (two) times daily as needed for cough.  Dispense: 20 capsule; Refill: 0 - cetirizine (ZYRTEC ALLERGY) 10 MG tablet; Take 1 tablet (10 mg total) by mouth daily.  Dispense: 30 tablet; Refill: 11   I have reviewed the patient's medical history (PMH, PSH, Social History, Family History, Medications, and allergies) , and have been updated if relevant. I spent 30 minutes reviewing chart and  face to face time with patient.    Return if symptoms worsen or fail to improve.    Loraine Grip Mayers, PA-C

## 2022-06-10 NOTE — Patient Instructions (Addendum)
To help with your symptoms, you are going to take azithromycin as directed, a steroid taper as directed, Zyrtec on a daily basis, and I sent 2 different cough medications.  One you can use twice daily, the other you can use at bedtime to help you with severe cough.  I encourage you to continue using over-the-counter pain medication as needed for your body aches.  Make sure that you are staying very well-hydrated, and get plenty of rest.  I hope that you feel better soon, please let us know if there is anything else we can do for you  Kennieth Rad, PA-C Physician Assistant [facility] [email or web address]   Pharyngitis  Pharyngitis is inflammation of the throat (pharynx). It is a very common cause of sore throat. Pharyngitis can be caused by a bacteria, but it is usually caused by a virus. Most cases of pharyngitis get better on their own without treatment. What are the causes? This condition may be caused by: Infection by viruses (viral). Viral pharyngitis spreads easily from person to person (is contagious) through coughing, sneezing, and sharing of personal items or utensils such as cups, forks, spoons, and toothbrushes. Infection by bacteria (bacterial). Bacterial pharyngitis may be spread by touching the nose or face after coming in contact with the bacteria, or through close contact, such as kissing. Allergies. Allergies can cause buildup of mucus in the throat (post-nasal drip), leading to inflammation and irritation. Allergies can also cause blocked nasal passages, forcing breathing through the mouth, which dries and irritates the throat. What increases the risk? You are more likely to develop this condition if: You are 56-36 years old. You are exposed to crowded environments such as daycare, school, or dormitory living. You live in a cold climate. You have a weakened disease-fighting (immune) system. What are the signs or  symptoms? Symptoms of this condition vary by the cause. Common symptoms of this condition include: Sore throat. Fatigue. Low-grade fever. Stuffy nose (nasal congestion) and cough. Headache. Other symptoms may include: Glands in the neck (lymph nodes) that are swollen. Skin rashes. Plaque-like film on the throat or tonsils. This is often a symptom of bacterial pharyngitis. Vomiting. Red, itchy eyes (conjunctivitis). Loss of appetite. Joint pain and muscle aches. Enlarged tonsils. How is this diagnosed? This condition may be diagnosed based on your medical history and a physical exam. Your health care provider will ask you questions about your illness and your symptoms. A swab of your throat may be done to check for bacteria (rapid strep test). Other lab tests may also be done, depending on the suspected cause, but these are rare. How is this treated? Many times, treatment is not needed for this condition. Pharyngitis usually gets better in 3-4 days without treatment. Bacterial pharyngitis may be treated with antibiotic medicines. Follow these instructions at home: Medicines Take over-the-counter and prescription medicines only as told by your health care provider. If you were prescribed an antibiotic medicine, take it as told by your health care provider. Do not stop taking the antibiotic even if you start to feel better. Use throat sprays to soothe your throat as told by your health care provider. Children can get pharyngitis. Do not give your child aspirin because of the association with Reye's syndrome. Managing pain To help with pain, try: Sipping warm liquids, such as broth, herbal tea, or warm water. Eating or drinking cold or frozen liquids, such as frozen ice pops. Gargling with a mixture of salt and water 3-4 times a  day or as needed. To make salt water, completely dissolve -1 tsp (3-6 g) of salt in 1 cup (237 mL) of warm water. Sucking on hard candy or throat  lozenges. Putting a cool-mist humidifier in your bedroom at night to moisten the air. Sitting in the bathroom with the door closed for 5-10 minutes while you run hot water in the shower.  General instructions  Do not use any products that contain nicotine or tobacco. These products include cigarettes, chewing tobacco, and vaping devices, such as e-cigarettes. If you need help quitting, ask your health care provider. Rest as told by your health care provider. Drink enough fluid to keep your urine pale yellow. How is this prevented? To help prevent becoming infected or spreading infection: Wash your hands often with soap and water for at least 20 seconds. If soap and water are not available, use hand sanitizer. Do not touch your eyes, nose, or mouth with unwashed hands, and wash hands after touching these areas. Do not share cups or eating utensils. Avoid close contact with people who are sick. Contact a health care provider if: You have large, tender lumps in your neck. You have a rash. You cough up green, yellow-brown, or bloody mucus. Get help right away if: Your neck becomes stiff. You drool or are unable to swallow liquids. You cannot drink or take medicines without vomiting. You have severe pain that does not go away, even after you take medicine. You have trouble breathing, and it is not caused by a stuffy nose. You have new pain and swelling in your joints such as the knees, ankles, wrists, or elbows. These symptoms may represent a serious problem that is an emergency. Do not wait to see if the symptoms will go away. Get medical help right away. Call your local emergency services (911 in the [place] Do not drive yourself to the hospital. Summary Pharyngitis is redness, pain, and swelling (inflammation) of the throat (pharynx). While pharyngitis can be caused by a bacteria, the most common causes are viral. Most cases of pharyngitis get better on their own without  treatment. Bacterial pharyngitis is treated with antibiotic medicines. This information is not intended to replace advice given to you by your health care provider. Make sure you discuss any questions you have with your health care provider. Document Revised: 08/15/2020 Document Reviewed: 08/15/2020 Elsevier Patient Education  [employer].

## 2022-06-11 ENCOUNTER — Ambulatory Visit: Payer: Medicaid Other | Admitting: Physician Assistant

## 2022-06-11 ENCOUNTER — Ambulatory Visit: Payer: Medicaid Other | Admitting: Critical Care Medicine

## 2022-06-20 ENCOUNTER — Other Ambulatory Visit: Payer: Medicaid Other | Admitting: *Deleted

## 2022-06-20 NOTE — Patient Instructions (Signed)
Visit Information  [name]  - as a part of your Medicaid benefit, you are eligible for care management and care coordination services at no cost or copay. I was unable to reach you by phone today but would be happy to help you with your health related needs. Please feel free to call me @ [phone].   A member of the Managed Medicaid care management team will reach out to you again over the next 14 days.   Lurena Joiner RN, BSN [place]  [employer] manager

## 2022-06-20 NOTE — Patient Outreach (Signed)
  Medicaid Managed Care   Unsuccessful Attempt Note   06/20/2022 Name: NAZARIAH CADET MRN: [id number] [name]: Dec 07, 1980  Referred by: Elsie Stain, MD Reason for referral : High Risk Managed Medicaid (Unsuccessful RNCM follow up telephone outreach)   A second unsuccessful telephone outreach was attempted today. The patient was referred to the case management team for assistance with care management and care coordination.    Follow Up Plan: A HIPAA compliant phone message was left for the patient providing contact information and requesting a return call. and The Managed Medicaid care management team will reach out to the patient again over the next 14 days.    Lurena Joiner RN, BSN [place]  [employer] manager

## 2022-07-04 ENCOUNTER — Encounter: Payer: Self-pay | Admitting: *Deleted

## 2022-07-04 ENCOUNTER — Other Ambulatory Visit: Payer: Medicaid Other | Admitting: *Deleted

## 2022-07-04 NOTE — Patient Outreach (Signed)
Medicaid Managed Care   Nurse Care Manager Note  07/04/2022 Name:  Leah Baker MRN:  [id number] [name]:  1981-03-22  Leah Baker is an 42 y.o. year old female who is a primary patient of [name] Burnett Harry, MD.  The [facility] Managed Care Coordination team was consulted for assistance with:    HTN Anemia  Leah Baker was given information about Medicaid Managed Care Coordination team services today. Leah Baker Patient agreed to services and verbal consent obtained.  Engaged with patient by telephone for follow up visit in response to provider referral for case management and/or care coordination services.   Assessments/Interventions:  Review of past medical history, allergies, medications, health status, including review of consultants reports, laboratory and other test data, was performed as part of comprehensive evaluation and provision of chronic care management services.  SDOH (Social Determinants of Health) assessments and interventions performed: SDOH Interventions    Flowsheet Row Patient Outreach Telephone from 07/04/2022 in [place] Office Visit from 06/10/2022 in [facility] at [facility] Patient Outreach Telephone from 03/26/2022 in [place] Patient Outreach Telephone from 02/21/2022 in [place] Patient Outreach Telephone from 07/24/2021 in [facility] [facility] Office Visit from 05/02/2021 in [place] Interventions        Food Insecurity Interventions -- -- -- -- Intervention Not Indicated --  Housing Interventions Intervention Not Indicated -- Other (Comment)  [Patient scheduled with BSW] -- Intervention Not Indicated --  Transportation Interventions Intervention Not Indicated -- -- Intervention Not Indicated Intervention Not Indicated --  Utilities Interventions  Intervention Not Indicated -- -- -- -- --  Depression Interventions/Treatment  -- Currently on Treatment -- -- Medication Medication, Currently on Treatment  Financial Strain Interventions -- -- -- -- Intervention Not Indicated --  Physical Activity Interventions -- -- -- -- Intervention Not Indicated --  Stress Interventions -- -- -- -- Intervention Not Indicated --  Social Connections Interventions -- -- -- -- Intervention Not Indicated --       Care Plan  Allergies  Allergen Reactions   Latex Itching and Rash    Medications Reviewed Today     Reviewed by Melissa Montane, RN (Registered Nurse) on 07/04/22 at [place] List Status: <None>   Medication Order Taking? Sig Documenting Provider Last Dose Status Informant  acetaminophen (TYLENOL) 500 MG tablet [id number] Yes Take 1 tablet (500 mg total) by mouth every 6 (six) hours as needed. Chase Picket, MD Taking Active Self  amLODipine (NORVASC) 10 MG tablet [id number] Yes Take 1 tablet (10 mg total) by mouth daily. [name], MD Taking Active   aspirin-sod bicarb-citric acid (ALKA-SELTZER) 325 MG TBEF tablet [id number] No Take 325 mg by mouth every 6 (six) hours as needed.  Patient not taking: Reported on 07/04/2022   [provider] Not Taking Active   azithromycin (ZITHROMAX) 250 MG tablet [id number] No Take 2 tabs PO day 1, then take 1 tab PO once daily  Patient not taking: Reported on 07/04/2022   Kennieth Rad, PA-C Not Taking Active            Med Note (Kaylon Laroche A   Fri Jul 04, 2022  9:10 AM) completed  benzonatate (TESSALON) 200 MG capsule [id number] No Take 1 capsule (200 mg total) by mouth 2 (two) times daily as needed for cough.  Patient not  taking: Reported on 07/04/2022   Kennieth Rad, PA-C Not Taking Active            Med Note (Challen Spainhour A   Fri Jul 04, 2022  9:10 AM) Ran out  Blood Pressure Monitoring (BLOOD PRESSURE KIT) DEVI [id number] Yes Use to measure blood pressure Elsie Stain, MD Taking  Active   busPIRone (BUSPAR) 7.5 MG tablet [id number] Yes Take 1 tablet (7.5 mg total) by mouth 3 (three) times daily. [name], MD Taking Active   cetirizine [facility] ALLERGY) 10 MG tablet [id number] Yes Take 1 tablet (10 mg total) by mouth daily. Mayers, Cari S, PA-C Taking Active   diclofenac Sodium (VOLTAREN) 1 % GEL [id number] Yes Apply 2 g topically 4 (four) times daily. Elsie Stain, MD Taking Active   Ferrous Sulfate (IRON) 325 (65 Fe) MG TABS [id number] Yes Take by mouth in the morning and at bedtime. [provider] Taking Active Self           Med Note (Enslee Bibbins A   Fri Feb 21, 2022  9:20 AM)    fluticasone (FLONASE) 50 MCG/ACT nasal spray [id number] Yes Place 2 sprays into both nostrils daily. Elsie Stain, MD Taking Active   hydrocortisone (ANUSOL-HC) 2.5 % rectal cream [id number] Yes Place 1 application. rectally 2 (two) times daily. Elsie Stain, MD Taking Active   Menthol-Camphor (TIGER [facility] ARTHRITIS RUB EX) [id number] Yes Apply 1 application topically daily as needed (knee pain). [provider] Taking Active Self  norethindrone (AYGESTIN) 5 MG tablet [id number] Yes Take 2 tablets (10 mg total) by mouth daily. [name], MD Taking Active   oxybutynin (DITROPAN) 5 MG tablet [id number] No Take 1 tablet (5 mg total) by mouth 3 (three) times daily.  Patient not taking: Reported on 05/14/2022   Elsie Stain, MD Not Taking Active   promethazine-dextromethorphan (PROMETHAZINE-DM) 6.25-15 MG/5ML syrup [id number] No Take 5 mLs by mouth 4 (four) times daily as needed for cough.  Patient not taking: Reported on 07/04/2022   Kennieth Rad, PA-C Not Taking Active            Med Note (Grayer Sproles A   Fri Jul 04, 2022  9:12 AM) Ran out  psyllium (METAMUCIL) 58.6 % packet [id number] Yes Take 1 packet by mouth daily. [provider] Taking Active   sertraline (ZOLOFT) 100 MG tablet [id number] Yes Take 1 tablet (100 mg total) by mouth at bedtime.  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   Lymphoid hyperplasia 07/30/2020   Anxiety 06/22/2020   Menorrhagia 11/27/2018   Rectal bleeding 11/26/2018   Liver lesion 09/09/2018   Hemorrhoid 11/29/2014   Iron deficiency anemia 05/04/2014   Mood disorder ([place]) 02/20/2014   Tears of meniscus and ACL of left knee 09/08/2013   Chronic pain of left knee 05/19/2013   Depression 05/14/2011   H/O: cesarean section 08/28/2010    Conditions to be addressed/monitored per PCP order:  HTN and Anemia  Care Plan : RN Care Manager Plan of Care  Updates made by Melissa Montane, RN since 07/04/2022 12:00 AM  Problem: Chronic Disease Management and Care Coordination Needs for Iron Deficiency Anemia, Internal hemorrhoid, Anxiety & Depression   Priority: High     Long-Range Goal: Development of Plan of Care for Chronic Disease Management and Care Coordination Needs (Iron Deficiency Anemia & Bleeding due to internal hemorroid; Anxiety & Depression)   Start Date: 07/24/2021  Expected End Date: 07/31/2022  Priority: High  Note:   Current Barriers:  Knowledge Deficits related to plan of care for management of Anxiety with Excessive Worry, Social Anxiety, and Depression: anxiety loss of energy/fatigue disturbed sleep decreased appetite and Iron Deficiency Anemia & Bleeding due to internal hemorrhoid  Care Coordination needs related to Lacks knowledge of community resource: Dental & Vision resources  Chronic Disease Management support and education needs related to Anxiety with Excessive Worry, Social Anxiety,, Depression: anxiety loss of energy/fatigue disturbed sleep decreased appetite, and  Iron Deficiency Anemia  and Bleeding due to internal hemorroid  RNCM Clinical Goal(s):  RNCM will help patient verbalize understanding of plan for management of Anxiety, Depression, and Iron Deficiency Anemia & bleeding due to internal hemorrhoid as evidenced by providing education regarding these chronic diseases. verbalize basic understanding of Anxiety, Depression, and Iron Deficiency Anemia & bleeding due to internal hemorrhoid disease process and self health management plan as evidenced by improved management of these chronic diseases take all medications exactly as prescribed and will call provider for medication related questions as evidenced by medication compliance    attend all scheduled medical appointments: 07/07/22 with PCP, 07/14/22 with Surgeon and 08/20/22 with Oncology; as evidenced by attending all scheduled appointments        demonstrate ongoing adherence to prescribed treatment plan for Anxiety, Depression, and Iron Deficiency Anemia & bleeding due to internal hemorrhoid as evidenced by patient being compliant with treatment plan. continue to work with Consulting civil engineer and/or Social Worker to address care management and care coordination needs related to Anxiety, Depression, and Iron Deficiency Anemia & Bleeding due to internal hemorrhoid  as evidenced by adherence to CM Team Scheduled appointments     work with Education officer, museum to address Lacks knowledge of community resource: Dental and Vision resources related to the management of Anxiety, Depression, and Iron Deficiency Anemia & bleeding due to internal hemorrhoid as evidenced by review of EMR and patient or Education officer, museum report     through collaboration with Consulting civil engineer, provider, and care team.   Interventions: Inter-disciplinary care team collaboration (see longitudinal plan of care) Evaluation of current treatment plan related to  self management and patient's adherence to plan as established by provider  Hypertension  Interventions:  (Status:  Goal on track:  Yes.) Long Term Goal -Patient reports some elevated BP since being sick in December. Recent BP at home 136/100, improved after resting Last practice recorded BP readings:  BP Readings from Last 3 Encounters:  06/10/22 125/88  05/14/22 117/82  04/10/22 (!) 136/92  Most recent eGFR/CrCl:  Lab Results  Component Value Date   EGFR 72 07/30/2020    No components found for: "CRCL"  Provided education to patient re: stroke prevention, s/s of heart attack and stroke Reviewed medications with patient and discussed importance of compliance Counseled on the importance of exercise goals with target of 150 minutes per week Reviewed scheduled/upcoming provider appointments including:  Advised to keep a journal of collected BP, take journal with her to appointment on 07/07/22 Advised to attend all appointments: PCP on 07/07/22, Surgeon on 07/14/22 and Oncology on 08/20/22 Discussed the importance  of self care and relaxation techniques   Anemia/Bleeding:  (Status: Goal on Track (progressing): YES.) Long Term Goal  Assessment of understanding of anemia/bleeding disorder diagnosis Basic overview and discussion of anemia/bleeding disorder or acute disease state Review of most recent labs:  Lab Results  Component Value Date   WBC 6.8 02/19/2022   HGB 12.3 02/19/2022   HCT 36.7 02/19/2022   MCV 88.2 02/19/2022   PLT 412 (H) 02/19/2022     Lab Results  Component Value Date   IRON 159 02/19/2022   TIBC 508 (H) 02/19/2022   FERRITIN 60 02/19/2022     Medications reviewed - Counseled on importance of regular laboratory monitoring as directed by provider; - Advised to call provider or 911 if active bleeding or signs and symptoms of active bleeding occur; - recommended promotion of rest and energy-conserving measures to manage fatigue, such as balancing activity with periods of rest - encouraged dietary changes to increase dietary intake of iron, Vitamin W54 and  folic acid as advised/prescribed - Assessed social determinant of health barriers;  Advised patient to report heavy bleeding( soaking a pad in an hour) to her provider Encouraged patient to continue eating iron rich foods, discussed examples Advised patient to follow up with General Surgeon on 07/14/22 for plan of care regarding internal hemorrhoids Advised patient to schedule follow up with GYN for follow up on vaginal bleeding   Anxiety & Depression  (Status:  Goal on track:  Yes.)  Long Term Goal Evaluation of current treatment plan related to Anxiety and Depression, Lacks knowledge of community resource: Dental & Vision resources,  self-management and patient's adherence to plan as established by provider. Discussed plans with patient for ongoing care management follow up and provided patient with direct contact information for care management team Evaluation of current treatment plan related to Anxiety & Depression and patient's adherence to plan as established by provider Reviewed medications with patient and discussed use of antidepressant Provided patient with MyChart educational materials related to stress management Reviewed scheduled/upcoming provider appointments including   07/07/22 with PCP, 07/14/22 with Surgeon and 08/20/22 with Oncology Provieded therapeutic listening Advised patient to schedule follow up with Roselyn Reef, LCSW(last visit was cancelled due to patient being sick)  Patient Goals/Self-Care Activities: Take medications as prescribed   Attend all scheduled provider appointments Call pharmacy for medication refills 3-7 days in advance of running out of medications Call provider office for new concerns or questions  Work with the social worker to address care coordination needs and will continue to work with the clinical team to address health care and disease management related needs       Follow Up:  Patient agrees to Care Plan and Follow-up.  Plan: The Managed  Medicaid care management team will reach out to the patient again over the next 30 days.  Date/time of next scheduled RN care management/care coordination outreach:  08/05/22 @ [place] RN, [place] RN Care Coordinator

## 2022-07-04 NOTE — Patient Instructions (Signed)
Visit Information  [name] was given information about Medicaid Managed Care team care coordination services as a part of their [place] Medicaid benefit. Leah Baker verbally consented to engagement with the [facility] Managed Care team.   If you are experiencing a medical emergency, please call 911 or report to your local emergency department or urgent care.   If you have a non-emergency medical problem during routine business hours, please contact your provider's office and ask to speak with a nurse.   For questions related to your [facility], please call: [phone] or visit the homepage here: [email or web address]  If you would like to schedule transportation through your [facility], please call the following number at least 2 days in advance of your appointment: [phone]   Rides for urgent appointments can also be made after hours by calling Member Services.  Call the [place] at [phone], at any time, 24 hours a day, 7 days a week. If you are in danger or need immediate medical attention call 911.  If you would like help to quit smoking, call 1-800-QUIT-NOW 978 149 2264) OR Espaol: 1-855-Djelo-Ya (1-324-401-0272) o para ms informacin haga clic aqu or Text READY to 200-400 to register via text  Leah Baker,   Please see education materials related to stress and HTN provided by MyChart link.  Patient verbalizes understanding of instructions and care plan provided today and agrees to view in [place]. Active MyChart status and patient understanding of how to access instructions and care plan via MyChart confirmed with patient.     Telephone follow up appointment with Managed Medicaid care management team member scheduled for:08/05/22 @ [place] RN, [place] RN Care  Coordinator   Following is a copy of your plan of care:  Care Plan : RN Care Manager Plan of Care  Updates made by Melissa Montane, RN since 07/04/2022 12:00 AM     Problem: Chronic Disease Management and Care Coordination Needs for Iron Deficiency Anemia, Internal hemorrhoid, Anxiety & Depression   Priority: High     Long-Range Goal: Development of Plan of Care for Chronic Disease Management and Care Coordination Needs (Iron Deficiency Anemia & Bleeding due to internal hemorroid; Anxiety & Depression)   Start Date: 07/24/2021  Expected End Date: 07/31/2022  Priority: High  Note:   Current Barriers:  Knowledge Deficits related to plan of care for management of Anxiety with Excessive Worry, Social Anxiety, and Depression: anxiety loss of energy/fatigue disturbed sleep decreased appetite and Iron Deficiency Anemia & Bleeding due to internal hemorrhoid  Care Coordination needs related to Lacks knowledge of community resource: Dental & Vision resources  Chronic Disease Management support and education needs related to Anxiety with Excessive Worry, Social Anxiety,, Depression: anxiety loss of energy/fatigue disturbed sleep decreased appetite, and Iron Deficiency Anemia  and Bleeding due to internal hemorroid  RNCM Clinical Goal(s):  RNCM will help patient verbalize understanding of plan for management of Anxiety, Depression, and Iron Deficiency Anemia & bleeding due to internal hemorrhoid as evidenced by providing education regarding these chronic diseases. verbalize basic understanding of Anxiety, Depression, and Iron Deficiency Anemia & bleeding due to internal hemorrhoid disease process and self health management plan as evidenced by improved management of these chronic diseases take all medications exactly as prescribed and will call provider for medication related questions as evidenced by medication compliance    attend all scheduled medical appointments: 07/07/22  with PCP, 07/14/22 with  Surgeon and 08/20/22 with Oncology; as evidenced by attending all scheduled appointments        demonstrate ongoing adherence to prescribed treatment plan for Anxiety, Depression, and Iron Deficiency Anemia & bleeding due to internal hemorrhoid as evidenced by patient being compliant with treatment plan. continue to work with Consulting civil engineer and/or Social Worker to address care management and care coordination needs related to Anxiety, Depression, and Iron Deficiency Anemia & Bleeding due to internal hemorrhoid  as evidenced by adherence to CM Team Scheduled appointments     work with Education officer, museum to address Lacks knowledge of community resource: Dental and Vision resources related to the management of Anxiety, Depression, and Iron Deficiency Anemia & bleeding due to internal hemorrhoid as evidenced by review of EMR and patient or Education officer, museum report     through collaboration with Consulting civil engineer, provider, and care team.   Interventions: Inter-disciplinary care team collaboration (see longitudinal plan of care) Evaluation of current treatment plan related to  self management and patient's adherence to plan as established by provider  Hypertension Interventions:  (Status:  Goal on track:  Yes.) Long Term Goal -Patient reports some elevated BP since being sick in December. Recent BP at home 136/100, improved after resting Last practice recorded BP readings:  BP Readings from Last 3 Encounters:  06/10/22 125/88  05/14/22 117/82  04/10/22 (!) 136/92  Most recent eGFR/CrCl:  Lab Results  Component Value Date   EGFR 72 07/30/2020    No components found for: "CRCL"  Provided education to patient re: stroke prevention, s/s of heart attack and stroke Reviewed medications with patient and discussed importance of compliance Counseled on the importance of exercise goals with target of 150 minutes per week Reviewed scheduled/upcoming provider appointments including:  Advised to keep a journal of  collected BP, take journal with her to appointment on 07/07/22 Advised to attend all appointments: PCP on 07/07/22, Surgeon on 07/14/22 and Oncology on 08/20/22 Discussed the importance of self care and relaxation techniques   Anemia/Bleeding:  (Status: Goal on Track (progressing): YES.) Long Term Goal  Assessment of understanding of anemia/bleeding disorder diagnosis Basic overview and discussion of anemia/bleeding disorder or acute disease state Review of most recent labs:  Lab Results  Component Value Date   WBC 6.8 02/19/2022   HGB 12.3 02/19/2022   HCT 36.7 02/19/2022   MCV 88.2 02/19/2022   PLT 412 (H) 02/19/2022     Lab Results  Component Value Date   IRON 159 02/19/2022   TIBC 508 (H) 02/19/2022   FERRITIN 60 02/19/2022     Medications reviewed - Counseled on importance of regular laboratory monitoring as directed by provider; - Advised to call provider or 911 if active bleeding or signs and symptoms of active bleeding occur; - recommended promotion of rest and energy-conserving measures to manage fatigue, such as balancing activity with periods of rest - encouraged dietary changes to increase dietary intake of iron, Vitamin M08 and folic acid as advised/prescribed - Assessed social determinant of health barriers;  Advised patient to report heavy bleeding( soaking a pad in an hour) to her provider Encouraged patient to continue eating iron rich foods, discussed examples Advised patient to follow up with General Surgeon on 07/14/22 for plan of care regarding internal hemorrhoids Advised patient to schedule follow up with GYN for follow up on vaginal bleeding   Anxiety & Depression  (Status:  Goal on track:  Yes.)  Long Term Goal Evaluation  of current treatment plan related to Anxiety and Depression, Lacks knowledge of community resource: Dental & Vision resources,  self-management and patient's adherence to plan as established by provider. Discussed plans with patient for  ongoing care management follow up and provided patient with direct contact information for care management team Evaluation of current treatment plan related to Anxiety & Depression and patient's adherence to plan as established by provider Reviewed medications with patient and discussed use of antidepressant Provided patient with MyChart educational materials related to stress management Reviewed scheduled/upcoming provider appointments including   07/07/22 with PCP, 07/14/22 with Surgeon and 08/20/22 with Oncology Provieded therapeutic listening Advised patient to schedule follow up with Roselyn Reef, LCSW(last visit was cancelled due to patient being sick)  Patient Goals/Self-Care Activities: Take medications as prescribed   Attend all scheduled provider appointments Call pharmacy for medication refills 3-7 days in advance of running out of medications Call provider office for new concerns or questions  Work with the social worker to address care coordination needs and will continue to work with the clinical team to address health care and disease management related needs

## 2022-07-07 ENCOUNTER — Telehealth: Payer: Self-pay

## 2022-07-07 ENCOUNTER — Ambulatory Visit: Payer: Medicaid Other | Attending: Critical Care Medicine | Admitting: Physician Assistant

## 2022-07-07 ENCOUNTER — Ambulatory Visit
Admission: RE | Admit: 2022-07-07 | Discharge: 2022-07-07 | Disposition: A | Discharge status: Home / Self Care | Payer: Medicaid Other | Source: Ambulatory Visit | Attending: Physician Assistant | Admitting: Physician Assistant

## 2022-07-07 ENCOUNTER — Encounter: Payer: Self-pay | Admitting: Physician Assistant

## 2022-07-07 VITALS — BP 124/89 | HR 89 | Ht 62.0 in | Wt 169.0 lb

## 2022-07-07 DIAGNOSIS — E559 Vitamin D deficiency, unspecified: Secondary | ICD-10-CM

## 2022-07-07 DIAGNOSIS — R21 Rash and other nonspecific skin eruption: Secondary | ICD-10-CM

## 2022-07-07 DIAGNOSIS — H6990 Unspecified Eustachian tube disorder, unspecified ear: Secondary | ICD-10-CM

## 2022-07-07 DIAGNOSIS — R739 Hyperglycemia, unspecified: Secondary | ICD-10-CM

## 2022-07-07 DIAGNOSIS — R5383 Other fatigue: Secondary | ICD-10-CM

## 2022-07-07 DIAGNOSIS — R0602 Shortness of breath: Secondary | ICD-10-CM | POA: Diagnosis not present

## 2022-07-07 DIAGNOSIS — R059 Cough, unspecified: Secondary | ICD-10-CM

## 2022-07-07 DIAGNOSIS — R052 Subacute cough: Secondary | ICD-10-CM | POA: Diagnosis not present

## 2022-07-07 MED ORDER — BENZONATATE 200 MG PO CAPS: 200.0000 mg | ORAL_CAPSULE | Freq: Two times a day (BID) | ORAL | 0 refills | Status: DC | PRN

## 2022-07-07 NOTE — Progress Notes (Signed)
Patient ID: Leah Baker, female   [name]: 08-Jan-1981, 42 y.o.   MRN: [id number]   Leah Baker, is a 42 y.o. female  HCW:[id number]  [id number]  [name] - 02/17/1981  Chief Complaint  Patient presents with   Follow-up    C/o body aches, loss of taste, and smell, feeling of fatigue, feels fluid in ears,    Rash    Rash appeared 1 week after completion of medication        Subjective:   Leah Baker is a 42 y.o. female here today for continued fatigue and multiple other symptoms since having URI in December then took antibiotics in early to mid January.  No fevers.  Cough is worse at night.  +fatigue.  Increased thirst.  Last labs blood sugar normal but time before that glucose =124.  Rash upper R arm.  No gums bleeding.  Still on iron.      No problems updated.  ALLERGIES: Allergies  Allergen Reactions   Latex Itching and Rash    PAST MEDICAL HISTORY: Past Medical History:  Diagnosis Date   Abnormal uterine bleeding (AUB)    Anemia    Phreesia 06/08/2020   Anxiety    Blood in stool last 2 years   Depression    Depression    Phreesia 06/08/2020   Hemorrhoids    History of blood transfusion 05/2019 2 units   iron transfusion given also   History of lower GI bleeding dec 2021 active problem   s/p  colonoscopy 09-01-2018,  hemorrhoids   IDA (iron deficiency anemia)    Inguinal lymphadenopathy    CT 09-14-2018  right side, followed by pcp   PONV (postoperative nausea and vomiting)    only happened once   Wears glasses    Wears glasses     MEDICATIONS AT HOME: Prior to Admission medications   Medication Sig Start Date End Date Taking? Authorizing Provider  acetaminophen (TYLENOL) 500 MG tablet Take 1 tablet (500 mg total) by mouth every 6 (six) hours as needed. 11/11/19  Yes Lamptey, Myrene Galas, MD  amLODipine (NORVASC) 10 MG tablet Take 1 tablet (10 mg total) by mouth daily. 04/10/22  Yes [name], MD  aspirin-sod bicarb-citric acid (ALKA-SELTZER) 325 MG TBEF  tablet Take 325 mg by mouth every 6 (six) hours as needed.   Yes [provider]  Blood Pressure Monitoring (BLOOD PRESSURE KIT) DEVI Use to measure blood pressure 01/22/22  Yes Elsie Stain, MD  busPIRone (BUSPAR) 7.5 MG tablet Take 1 tablet (7.5 mg total) by mouth 3 (three) times daily. 04/10/22  Yes [name], MD  cetirizine (ZYRTEC ALLERGY) 10 MG tablet Take 1 tablet (10 mg total) by mouth daily. 06/10/22  Yes Mayers, Cari S, PA-C  diclofenac Sodium (VOLTAREN) 1 % GEL Apply 2 g topically 4 (four) times daily. 01/22/22  Yes Elsie Stain, MD  Ferrous Sulfate (IRON) 325 (65 Fe) MG TABS Take by mouth in the morning and at bedtime.   Yes [provider]  fluticasone (FLONASE) 50 MCG/ACT nasal spray Place 2 sprays into both nostrils daily. 09/17/21  Yes Elsie Stain, MD  hydrocortisone (ANUSOL-HC) 2.5 % rectal cream Place 1 application. rectally 2 (two) times daily. 09/17/21  Yes Elsie Stain, MD  Menthol-Camphor (TIGER BALM ARTHRITIS RUB EX) Apply 1 application topically daily as needed (knee pain).   Yes [provider]  norethindrone (AYGESTIN) 5 MG tablet Take 2 tablets (10 mg total) by mouth daily. 04/10/22  Yes [name], MD  oxybutynin (DITROPAN) 5 MG tablet Take 1 tablet (5 mg total) by mouth 3 (three) times daily. 10/10/21  Yes Elsie Stain, MD  psyllium (METAMUCIL) 58.6 % packet Take 1 packet by mouth daily.   Yes [provider]  sertraline (ZOLOFT) 100 MG tablet Take 1 tablet (100 mg total) by mouth at bedtime. 09/17/21  Yes Elsie Stain, MD  benzonatate (TESSALON) 200 MG capsule Take 1 capsule (200 mg total) by mouth 2 (two) times daily as needed for cough. 07/07/22   Argentina Donovan, PA-C  promethazine-dextromethorphan (PROMETHAZINE-DM) 6.25-15 MG/5ML syrup Take 5 mLs by mouth 4 (four) times daily as needed for cough. Patient not taking: Reported on 07/04/2022 06/10/22   Mayers, Cari S, PA-C    ROS: Neg cardiac Neg GI Neg  GU Neg MS Neg psych Neg neuro  Objective:   Vitals:   07/07/22 0844  BP: 124/89  Pulse: 89  SpO2: 100%  Weight: 169 lb (76.7 kg)  Height: '5\' 2"'$  (1.575 m)   Exam General appearance : Awake, alert, not in any distress. Speech Clear. Not toxic looking HEENT: Atraumatic and Normocephalic, throat with PND, no exudate or erythema.  B TM with congestion but no erythema.  Neck: Supple, no JVD. No cervical lymphadenopathy.  Chest: Good air entry bilaterally, CTAB.  No rales/rhonchi/wheezing CVS: S1 S2 regular, no murmurs.  Extremities: B/L Lower Ext shows no edema, both legs are warm to touch Neurology: Awake alert, and oriented X 3, CN II-XII intact, Non focal Skin: No Rash  Data Review No results found for: "HGBA1C"  Assessment & Plan   1. Dysfunction of Eustachian tube, unspecified laterality Flonase, and sudafed  2. Hyperglycemia I have had a lengthy discussion and provided education about insulin resistance and the intake of too much sugar/refined carbohydrates.  I have advised the patient to work at a goal of eliminating sugary drinks, candy, desserts, sweets, refined sugars, processed foods, and white carbohydrates.  The patient expresses understanding.   - Comprehensive metabolic panel - Hemoglobin A1c  3. Other fatigue - DG Chest 2 View; Future - CBC with Differential/Platelet - Comprehensive metabolic panel  4. Subacute cough Fluids, rest, resiratory care - DG Chest 2 View; Future - Comprehensive metabolic panel - benzonatate (TESSALON) 200 MG capsule; Take 1 capsule (200 mg total) by mouth 2 (two) times daily as needed for cough.  Dispense: 40 capsule; Refill: 0  5. Rash - CBC with Differential/Platelet  6. Vitamin D deficiency - Vitamin D, 25-hydroxy     Return if symptoms worsen or fail to improve.  The patient was given clear instructions to go to ER or return to medical center if symptoms don't improve, worsen or new problems develop. The patient  verbalized understanding. The patient was told to call to get lab results if they haven't heard anything in the next week.      Leah Caldron, PA-C [facility] and [facility] [place], [place]   07/07/2022, 9:18 AM

## 2022-07-07 NOTE — Telephone Encounter (Signed)
Communication received  via teams from [facility]( [facility]) "Leah Baker" MRN [id number] that was sent to [place] for a chest x ray and was told their machines was down and sent her back to [place] and told her to go to the [place] downstairs and the told her they dont do chest x rays anymore." Call placed to [facility] imaging. Chest X-ray are available at this location and equipment is fully functioning,  Patient notified. Patient voiced that she had figured it out and was at the location now registering to receive chest x-ray.

## 2022-07-07 NOTE — Patient Instructions (Addendum)
Sudafed or phenylephrine will help with congestion/pressure behind ears.  Resume flonase  Eustachian Tube Dysfunction  Eustachian tube dysfunction refers to a condition in which a blockage develops in the narrow passage that connects the middle ear to the back of the nose (eustachian tube). The eustachian tube regulates air pressure in the middle ear by letting air move between the ear and nose. It also helps to drain fluid from the middle ear space. Eustachian tube dysfunction can affect one or both ears. When the eustachian tube does not function properly, air pressure, fluid, or both can build up in the middle ear. What are the causes? This condition occurs when the eustachian tube becomes blocked or cannot open normally. Common causes of this condition include: Ear infections. Colds and other infections that affect the nose, mouth, and throat (upper respiratory tract). Allergies. Irritation from cigarette smoke. Irritation from stomach acid coming up into the esophagus (gastroesophageal reflux). The esophagus is the part of the body that moves food from the mouth to the stomach. Sudden changes in air pressure, such as from descending in an airplane or scuba diving. Abnormal growths in the nose or throat, such as: Growths that line the nose (nasal polyps). Abnormal growth of cells (tumors). Enlarged tissue at the back of the throat (adenoids). What increases the risk? You are more likely to develop this condition if: You smoke. You are overweight. You are a child who has: Certain birth defects of the mouth, such as cleft palate. Large tonsils or adenoids. What are the signs or symptoms? Common symptoms of this condition include: A feeling of fullness in the ear. Ear pain. Clicking or popping noises in the ear. Ringing in the ear (tinnitus). Hearing loss. Loss of balance. Dizziness. Symptoms may get worse when the air pressure around you changes, such as when you travel to an area  of high elevation, fly on an airplane, or go scuba diving. How is this diagnosed? This condition may be diagnosed based on: Your symptoms. A physical exam of your ears, nose, and throat. Tests, such as those that measure: The movement of your eardrum. Your hearing (audiometry). How is this treated? Treatment depends on the cause and severity of your condition. In mild cases, you may relieve your symptoms by moving air into your ears. This is called "popping the ears." In more severe cases, or if you have symptoms of fluid in your ears, treatment may include: Medicines to relieve congestion (decongestants). Medicines that treat allergies (antihistamines). Nasal sprays or ear drops that contain medicines that reduce swelling (steroids). A procedure to drain the fluid in your eardrum. In this procedure, a small tube may be placed in the eardrum to: Drain the fluid. Restore the air in the middle ear space. A procedure to insert a balloon device through the nose to inflate the opening of the eustachian tube (balloon dilation). Follow these instructions at home: Lifestyle Do not do any of the following until your health care provider approves: Travel to high altitudes. Fly in airplanes. Work in a Pension scheme manager or room. Scuba dive. Do not use any products that contain nicotine or tobacco. These products include cigarettes, chewing tobacco, and vaping devices, such as e-cigarettes. If you need help quitting, ask your health care provider. Keep your ears dry. Wear fitted earplugs during showering and bathing. Dry your ears completely after. General instructions Take over-the-counter and prescription medicines only as told by your health care provider. Use techniques to help pop your ears as recommended by  your health care provider. These may include: Chewing gum. Yawning. Frequent, forceful swallowing. Closing your mouth, holding your nose closed, and gently blowing as if you are trying  to blow air out of your nose. Keep all follow-up visits. This is important. Contact a health care provider if: Your symptoms do not go away after treatment. Your symptoms come back after treatment. You are unable to pop your ears. You have: A fever. Pain in your ear. Pain in your head or neck. Fluid draining from your ear. Your hearing suddenly changes. You become very dizzy. You lose your balance. Get help right away if: You have a sudden, severe increase in any of your symptoms. Summary Eustachian tube dysfunction refers to a condition in which a blockage develops in the eustachian tube. It can be caused by ear infections, allergies, inhaled irritants, or abnormal growths in the nose or throat. Symptoms may include ear pain or fullness, hearing loss, or ringing in the ears. Mild cases are treated with techniques to unblock the ears, such as yawning or chewing gum. More severe cases are treated with medicines or procedures. This information is not intended to replace advice given to you by your health care provider. Make sure you discuss any questions you have with your health care provider. Document Revised: 07/30/2020 Document Reviewed: 07/30/2020 Elsevier Patient Education  Early.

## 2022-07-08 LAB — CBC WITH DIFFERENTIAL/PLATELET
Basophils Absolute: 0.1 10*3/uL (ref 0.0–0.2)
Basos: 1 %
EOS (ABSOLUTE): 0.2 10*3/uL (ref 0.0–0.4)
Eos: 3 %
Hematocrit: 41 % (ref 34.0–46.6)
Hemoglobin: 13.4 g/dL (ref 11.1–15.9)
Immature Grans (Abs): 0 10*3/uL (ref 0.0–0.1)
Immature Granulocytes: 0 %
Lymphocytes Absolute: 2.2 10*3/uL (ref 0.7–3.1)
Lymphs: 31 %
MCH: 29.2 pg (ref 26.6–33.0)
MCHC: 32.7 g/dL (ref 31.5–35.7)
MCV: 89 fL (ref 79–97)
Monocytes Absolute: 0.5 10*3/uL (ref 0.1–0.9)
Monocytes: 8 %
Neutrophils Absolute: 4 10*3/uL (ref 1.4–7.0)
Neutrophils: 57 %
Platelets: 477 10*3/uL — ABNORMAL HIGH (ref 150–450)
RBC: 4.59 x10E6/uL (ref 3.77–5.28)
RDW: 12.8 % (ref 11.7–15.4)
WBC: 7 10*3/uL (ref 3.4–10.8)

## 2022-07-08 LAB — COMPREHENSIVE METABOLIC PANEL
ALT: 22 IU/L (ref 0–32)
AST: 27 IU/L (ref 0–40)
Albumin/Globulin Ratio: 2 (ref 1.2–2.2)
Albumin: 5.1 g/dL — ABNORMAL HIGH (ref 3.9–4.9)
Alkaline Phosphatase: 46 IU/L (ref 44–121)
BUN/Creatinine Ratio: 16 (ref 9–23)
BUN: 14 mg/dL (ref 6–24)
Bilirubin Total: 0.2 mg/dL (ref 0.0–1.2)
CO2: 17 mmol/L — ABNORMAL LOW (ref 20–29)
Calcium: 9.8 mg/dL (ref 8.7–10.2)
Chloride: 104 mmol/L (ref 96–106)
Creatinine, Ser: 0.88 mg/dL (ref 0.57–1.00)
Globulin, Total: 2.5 g/dL (ref 1.5–4.5)
Glucose: 90 mg/dL (ref 70–99)
Potassium: 4.1 mmol/L (ref 3.5–5.2)
Sodium: 140 mmol/L (ref 134–144)
Total Protein: 7.6 g/dL (ref 6.0–8.5)
eGFR: 84 mL/min/{1.73_m2} (ref >59)

## 2022-07-08 LAB — HEMOGLOBIN A1C
Est. average glucose Bld gHb Est-mCnc: 111 mg/dL
Hgb A1c MFr Bld: 5.5 % (ref 4.8–5.6)

## 2022-07-08 LAB — VITAMIN D 25 HYDROXY (VIT D DEFICIENCY, FRACTURES): Vit D, 25-Hydroxy: 8.5 ng/mL — ABNORMAL LOW (ref 30.0–100.0)

## 2022-07-09 ENCOUNTER — Other Ambulatory Visit: Payer: Self-pay | Admitting: Physician Assistant

## 2022-07-09 MED ORDER — VITAMIN D (ERGOCALCIFEROL) 1.25 MG (50000 UNIT) PO CAPS: 50000.0000 [IU] | ORAL_CAPSULE | ORAL | 0 refills | Status: DC

## 2022-08-05 ENCOUNTER — Encounter: Payer: Self-pay | Admitting: *Deleted

## 2022-08-05 ENCOUNTER — Other Ambulatory Visit: Payer: Medicaid Other | Admitting: *Deleted

## 2022-08-05 NOTE — Patient Instructions (Signed)
Visit Information  Ms. Rund was given information about Medicaid Managed Care team care coordination services as a part of their [place] Medicaid benefit. [name] verbally consented to engagement with the [facility] Managed Care team.   If you are experiencing a medical emergency, please call 911 or report to your local emergency department or urgent care.   If you have a non-emergency medical problem during routine business hours, please contact your provider's office and ask to speak with a nurse.   For questions related to your [facility], please call: [phone] or visit the homepage here: [email or web address]  If you would like to schedule transportation through your [facility], please call the following number at least 2 days in advance of your appointment: [phone]   Rides for urgent appointments can also be made after hours by calling Member Services.  Call the [facility] at [phone], at any time, 24 hours a day, 7 days a week. If you are in danger or need immediate medical attention call 911.  If you would like help to quit smoking, call 1-800-QUIT-NOW (207)618-6388) OR Espaol: 1-855-Djelo-Ya HD:1601594) o para ms informacin haga clic aqu or Text READY to 200-400 to register via text  Ms. Dooms,   Please see education materials related to Vit D Deficiency provided by MyChart link.  Patient verbalizes understanding of instructions and care plan provided today and agrees to view in [place]. Active MyChart status and patient understanding of how to access instructions and care plan via MyChart confirmed with patient.     Telephone follow up appointment with Managed Medicaid care management team member scheduled for:10/07/22 @ [place] RN, [place] RN Care  Coordinator   Following is a copy of your plan of care:  Care Plan : RN Care Manager Plan of Care  Updates made by Melissa Montane, RN since 08/05/2022 12:00 AM     Problem: Chronic Disease Management and Care Coordination Needs for Iron Deficiency Anemia, Internal hemorrhoid, Anxiety & Depression   Priority: High     Long-Range Goal: Development of Plan of Care for Chronic Disease Management and Care Coordination Needs (Iron Deficiency Anemia & Bleeding due to internal hemorroid; Anxiety & Depression)   Start Date: 07/24/2021  Expected End Date: 10/31/2022  Priority: High  Note:   Current Barriers:  Knowledge Deficits related to plan of care for management of Anxiety with Excessive Worry, Social Anxiety, and Depression: anxiety loss of energy/fatigue disturbed sleep decreased appetite and Iron Deficiency Anemia & Bleeding due to internal hemorrhoid  Care Coordination needs related to Lacks knowledge of community resource: Dental & Vision resources  Chronic Disease Management support and education needs related to Anxiety with Excessive Worry, Social Anxiety,, Depression: anxiety loss of energy/fatigue disturbed sleep decreased appetite, and Iron Deficiency Anemia  and Bleeding due to internal hemorroid  RNCM Clinical Goal(s):  RNCM will help patient verbalize understanding of plan for management of Anxiety, Depression, and Iron Deficiency Anemia & bleeding due to internal hemorrhoid as evidenced by providing education regarding these chronic diseases. verbalize basic understanding of Anxiety, Depression, and Iron Deficiency Anemia & bleeding due to internal hemorrhoid disease process and self health management plan as evidenced by improved management of these chronic diseases take all medications exactly as prescribed and will call provider for medication related questions as evidenced by medication compliance    attend all scheduled medical appointments:  08/20/22 with Oncology; as  evidenced by attending all scheduled appointments        demonstrate ongoing adherence to prescribed treatment plan for Anxiety, Depression, and Iron Deficiency Anemia & bleeding due to internal hemorrhoid as evidenced by patient being compliant with treatment plan. continue to work with Consulting civil engineer and/or Social Worker to address care management and care coordination needs related to Anxiety, Depression, and Iron Deficiency Anemia & Bleeding due to internal hemorrhoid  as evidenced by adherence to CM Team Scheduled appointments     work with Education officer, museum to address Lacks knowledge of community resource: Dental and Vision resources related to the management of Anxiety, Depression, and Iron Deficiency Anemia & bleeding due to internal hemorrhoid as evidenced by review of EMR and patient or Education officer, museum report     through collaboration with Consulting civil engineer, provider, and care team.   Interventions: Inter-disciplinary care team collaboration (see longitudinal plan of care) Evaluation of current treatment plan related to  self management and patient's adherence to plan as established by provider  Hypertension Interventions:  (Status:  Goal Met.) Long Term Goal Last practice recorded BP readings:  BP Readings from Last 3 Encounters:  07/07/22 124/89  06/10/22 125/88  05/14/22 117/82  Most recent eGFR/CrCl:  Lab Results  Component Value Date   EGFR 84 07/07/2022    No components found for: "CRCL"  Provided education to patient re: stroke prevention, s/s of heart attack and stroke Reviewed medications with patient and discussed importance of compliance Counseled on the importance of exercise goals with target of 150 minutes per week Reviewed scheduled/upcoming provider appointments including:  Advised to keep a journal of collected BP, take journal with her to appointment on 07/07/22 Advised to attend all appointments: PCP on 07/07/22, Surgeon on 07/14/22 and Oncology on 08/20/22 Discussed the  importance of self care and relaxation techniques   Anemia/Bleeding:  (Status: Goal Met.) Long Term Goal  Assessment of understanding of anemia/bleeding disorder diagnosis Basic overview and discussion of anemia/bleeding disorder or acute disease state Review of most recent labs:  Lab Results  Component Value Date   WBC 7.0 07/07/2022   HGB 13.4 07/07/2022   HCT 41.0 07/07/2022   MCV 89 07/07/2022   PLT 477 (H) 07/07/2022     Lab Results  Component Value Date   IRON 159 02/19/2022   TIBC 508 (H) 02/19/2022   FERRITIN 60 02/19/2022     Medications reviewed - Counseled on importance of regular laboratory monitoring as directed by provider; - Advised to call provider or 911 if active bleeding or signs and symptoms of active bleeding occur; - recommended promotion of rest and energy-conserving measures to manage fatigue, such as balancing activity with periods of rest - encouraged dietary changes to increase dietary intake of iron, Vitamin [id number] and folic acid as advised/prescribed - Assessed social determinant of health barriers;  Advised patient to report heavy bleeding( soaking a pad in an hour) to her provider Encouraged patient to continue eating iron rich foods, discussed examples   Anxiety & Depression  (Status:  Goal on track:  Yes.)  Long Term Goal Evaluation of current treatment plan related to Anxiety and Depression, Lacks knowledge of community resource: Dental & Vision resources,  self-management and patient's adherence to plan as established by provider. Discussed plans with patient for ongoing care management follow up and provided patient with direct contact information for care management team Evaluation of current treatment plan related to Anxiety & Depression and patient's  adherence to plan as established by provider Reviewed medications with patient and discussed use of antidepressant Provided patient with MyChart educational materials related to stress  management Reviewed scheduled/upcoming provider appointments including   08/20/22 with Oncology Provieded therapeutic listening Referral to MM LCSW for managing Anxiety and Depression-scheduled on 08/12/22 @ 10:30am   Vitamin D Deficiency  (Status:  New goal.)  Long Term Goal Evaluation of current treatment plan related to  Vitamin D. Deficiency ,  self-management and patient's adherence to plan as established by provider. Discussed plans with patient for ongoing care management follow up and provided patient with direct contact information for care management team Evaluation of current treatment plan related to Vitamin D Deficiency and patient's adherence to plan as established by provider Advised patient to spend 10-15 minutes outside every day, take a walk, sit by an open window Provided education to patient re: Vitamin D rich foods Reviewed medications with patient and discussed the importance of taking as directed Assessed social determinant of health barriers Advised patient to follow up with PCP in 3-4 months for reevaluation   Patient Goals/Self-Care Activities: Take medications as prescribed   Attend all scheduled provider appointments Call pharmacy for medication refills 3-7 days in advance of running out of medications Call provider office for new concerns or questions  Work with the social worker to address care coordination needs and will continue to work with the clinical team to address health care and disease management related needs

## 2022-08-05 NOTE — Patient Outreach (Signed)
Medicaid Managed Care   Nurse Care Manager Note  08/05/2022 Name:  Leah Baker MRN:  [id number] [name]:  12-22-1980  Leah Baker is an 42 y.o. year old female who is a primary patient of Leah Stain, MD.  The [facility] Managed Care Coordination team was consulted for assistance with:    HTN Anxiety Depression Anemia  Leah Baker was given information about Medicaid Managed Care Coordination team services today. Leah Baker Patient agreed to services and verbal consent obtained.  Engaged with patient by telephone for follow up visit in response to provider referral for case management and/or care coordination services.   Assessments/Interventions:  Review of past medical history, allergies, medications, health status, including review of consultants reports, laboratory and other test data, was performed as part of comprehensive evaluation and provision of chronic care management services.  SDOH (Social Determinants of Health) assessments and interventions performed: SDOH Interventions    Flowsheet Row Patient Outreach Telephone from 08/05/2022 in [place] Patient Outreach Telephone from 07/04/2022 in [place] Office Visit from 06/10/2022 in [place] [facility] Patient Outreach Telephone from 03/26/2022 in [place] Patient Outreach Telephone from 02/21/2022 in [place] Patient Outreach Telephone from 07/24/2021 in [place] Interventions        Food Insecurity Interventions Intervention Not Indicated -- -- -- -- Intervention Not Indicated  Housing Interventions -- Intervention Not Indicated -- Other (Comment)  [Patient scheduled with BSW] -- Intervention Not Indicated  Transportation Interventions -- Intervention Not Indicated -- -- Intervention Not Indicated Intervention  Not Indicated  Utilities Interventions -- Intervention Not Indicated -- -- -- --  Depression Interventions/Treatment  -- -- Currently on Treatment -- -- Medication  Financial Strain Interventions -- -- -- -- -- Intervention Not Indicated  Physical Activity Interventions -- -- -- -- -- Intervention Not Indicated  Stress Interventions -- -- -- -- -- Intervention Not Indicated  Social Connections Interventions -- -- -- -- -- Intervention Not Indicated       Care Plan  Allergies  Allergen Reactions   Latex Itching and Rash    Medications Reviewed Today     Reviewed by Leah Montane, RN (Registered Nurse) on 08/05/22 at [place] List Status: <None>   Medication Order Taking? Sig Documenting Provider Last Dose Status Informant  acetaminophen (TYLENOL) 500 MG tablet [id number] Yes Take 1 tablet (500 mg total) by mouth every 6 (six) hours as needed. Leah Picket, MD Taking Active Self  amLODipine (NORVASC) 10 MG tablet [id number] Yes Take 1 tablet (10 mg total) by mouth daily. Leah Jude, MD Taking Active   aspirin-sod bicarb-citric acid (ALKA-SELTZER) 325 MG TBEF tablet [id number] No Take 325 mg by mouth every 6 (six) hours as needed.  Patient not taking: Reported on 08/05/2022   [provider] Not Taking Active   benzonatate (TESSALON) 200 MG capsule [id number] No Take 1 capsule (200 mg total) by mouth 2 (two) times daily as needed for cough.  Patient not taking: Reported on 08/05/2022   Mathis Dad Not Taking Active   Blood Pressure Monitoring (BLOOD PRESSURE KIT) DEVI [id number] Yes Use to measure blood pressure Leah Stain, MD Taking Active   bupivacaine liposome (EXPAREL) 1.3 % injection 266 mg [id number]   Stark Klein, MD  Active   busPIRone (BUSPAR) 7.5 MG tablet [id number] Yes Take  1 tablet (7.5 mg total) by mouth 3 (three) times daily. Leah Jude, MD Taking Active   cetirizine [facility] ALLERGY) 10 MG tablet [id number] Yes Take 1 tablet (10 mg  total) by mouth daily. Mayers, Cari S, PA-C Taking Active   diclofenac Sodium (VOLTAREN) 1 % GEL [id number] Yes Apply 2 g topically 4 (four) times daily. Leah Stain, MD Taking Active   Ferrous Sulfate (IRON) 325 (65 Fe) MG TABS [id number] Yes Take by mouth in the morning and at bedtime. [provider] Taking Active Self           Med Note (Leah Baker A   Fri Feb 21, 2022  9:20 AM)    fluticasone (FLONASE) 50 MCG/ACT nasal spray [id number] Yes Place 2 sprays into both nostrils daily. Leah Stain, MD Taking Active   hydrocortisone (ANUSOL-HC) 2.5 % rectal cream [id number] Yes Place 1 application. rectally 2 (two) times daily. Leah Stain, MD Taking Active   Menthol-Camphor (TIGER [facility] ARTHRITIS RUB EX) [id number] Yes Apply 1 application topically daily as needed (knee pain). [provider] Taking Active Self  norethindrone (AYGESTIN) 5 MG tablet [id number] Yes Take 2 tablets (10 mg total) by mouth daily. Leah Jude, MD Taking Active   oxybutynin (DITROPAN) 5 MG tablet [id number] Yes Take 1 tablet (5 mg total) by mouth 3 (three) times daily. Leah Stain, MD Taking Active   promethazine-dextromethorphan (PROMETHAZINE-DM) 6.25-15 MG/5ML syrup [id number] No Take 5 mLs by mouth 4 (four) times daily as needed for cough.  Patient not taking: Reported on 07/04/2022   Kennieth Rad, PA-C Not Taking Active            Med Note (Kumiko Fishman A   Fri Jul 04, 2022  9:12 AM) Ran out  psyllium (METAMUCIL) 58.6 % packet [id number] Yes Take 1 packet by mouth daily. [provider] Taking Active   sertraline (ZOLOFT) 100 MG tablet [id number] Yes Take 1 tablet (100 mg total) by mouth at bedtime. Leah Stain, MD Taking Active   Vitamin D, Ergocalciferol, (DRISDOL) 1.25 MG (50000 UNIT) CAPS capsule [id number] Yes Take 1 capsule (50,000 Units total) by mouth every 7 (seven) days. 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   Lymphoid hyperplasia 07/30/2020   Anxiety 06/22/2020   Menorrhagia 11/27/2018   Rectal bleeding 11/26/2018   Liver lesion 09/09/2018   Hemorrhoid 11/29/2014   Iron deficiency anemia 05/04/2014   Mood disorder ([place]) 02/20/2014   Tears of meniscus and ACL of left knee 09/08/2013   Chronic pain of left knee 05/19/2013   Depression 05/14/2011   H/O: cesarean section 08/28/2010    Conditions to be addressed/monitored per PCP order:  HTN, Anxiety, Depression, and Anemia  Care Plan : RN Care Manager Plan of Care  Updates made by Leah Montane, RN since 08/05/2022 12:00 AM     Problem: Chronic Disease Management and Care Coordination Needs for Iron Deficiency Anemia, Internal hemorrhoid, Anxiety & Depression   Priority: High     Long-Range Goal: Development of Plan of Care for Chronic Disease Management and Care Coordination Needs (Iron Deficiency Anemia & Bleeding due to  internal hemorroid; Anxiety & Depression)   Start Date: 07/24/2021  Expected End Date: 10/31/2022  Priority: High  Note:   Current Barriers:  Knowledge Deficits related to plan of care for management of Anxiety with Excessive Worry, Social Anxiety, and Depression: anxiety loss of energy/fatigue disturbed sleep decreased appetite and Iron Deficiency Anemia & Bleeding due to internal hemorrhoid  Care Coordination needs related to Lacks knowledge of community resource: Dental & Vision resources  Chronic Disease Management support and education needs related to Anxiety with Excessive Worry, Social Anxiety,, Depression: anxiety loss of energy/fatigue disturbed sleep decreased appetite, and Iron Deficiency Anemia  and Bleeding due to internal  hemorroid  RNCM Clinical Goal(s):  RNCM will help patient verbalize understanding of plan for management of Anxiety, Depression, and Iron Deficiency Anemia & bleeding due to internal hemorrhoid as evidenced by providing education regarding these chronic diseases. verbalize basic understanding of Anxiety, Depression, and Iron Deficiency Anemia & bleeding due to internal hemorrhoid disease process and self health management plan as evidenced by improved management of these chronic diseases take all medications exactly as prescribed and will call provider for medication related questions as evidenced by medication compliance    attend all scheduled medical appointments:  08/20/22 with Oncology; as evidenced by attending all scheduled appointments        demonstrate ongoing adherence to prescribed treatment plan for Anxiety, Depression, and Iron Deficiency Anemia & bleeding due to internal hemorrhoid as evidenced by patient being compliant with treatment plan. continue to work with Consulting civil engineer and/or Social Worker to address care management and care coordination needs related to Anxiety, Depression, and Iron Deficiency Anemia & Bleeding due to internal hemorrhoid  as evidenced by adherence to CM Team Scheduled appointments     work with Education officer, museum to address [employer] knowledge of community resource: Dental and Vision resources related to the management of Anxiety, Depression, and Iron Deficiency Anemia & bleeding due to internal hemorrhoid as evidenced by review of EMR and patient or Education officer, museum report     through collaboration with Consulting civil engineer, provider, and care team.   Interventions: Inter-disciplinary care team collaboration (see longitudinal plan of care) Evaluation of current treatment plan related to  self management and patient's adherence to plan as established by provider  Hypertension Interventions:  (Status:  Goal Met.) Long Term Goal Last practice recorded BP readings:  BP Readings  from Last 3 Encounters:  07/07/22 124/89  06/10/22 125/88  05/14/22 117/82  Most recent eGFR/CrCl:  Lab Results  Component Value Date   EGFR 84 07/07/2022    No components found for: "CRCL"  Provided education to patient re: stroke prevention, s/s of heart attack and stroke Reviewed medications with patient and discussed importance of compliance Counseled on the importance of exercise goals with target of 150 minutes per week Reviewed scheduled/upcoming provider appointments including:  Advised to keep a journal of collected BP, take journal with her to appointment on 07/07/22 Advised to attend all appointments: PCP on 07/07/22, Surgeon on 07/14/22 and Oncology on 08/20/22 Discussed the importance of self care and relaxation techniques   Anemia/Bleeding:  (Status: Goal Met.) Long Term Goal  Assessment of understanding of anemia/bleeding disorder diagnosis Basic overview and discussion of anemia/bleeding disorder or acute disease state Review of most recent labs:  Lab Results  Component Value Date   WBC 7.0 07/07/2022   HGB 13.4 07/07/2022   HCT 41.0 07/07/2022   MCV 89 07/07/2022   PLT 477 (H) 07/07/2022  Lab Results  Component Value Date   IRON 159 02/19/2022   TIBC 508 (H) 02/19/2022   FERRITIN 60 02/19/2022     Medications reviewed - Counseled on importance of regular laboratory monitoring as directed by provider; - Advised to call provider or 911 if active bleeding or signs and symptoms of active bleeding occur; - recommended promotion of rest and energy-conserving measures to manage fatigue, such as balancing activity with periods of rest - encouraged dietary changes to increase dietary intake of iron, Vitamin 123456 and folic acid as advised/prescribed - Assessed social determinant of health barriers;  Advised patient to report heavy bleeding( soaking a pad in an hour) to her provider Encouraged patient to continue eating iron rich foods, discussed examples   Anxiety &  Depression  (Status:  Goal on track:  Yes.)  Long Term Goal Evaluation of current treatment plan related to Anxiety and Depression, Lacks knowledge of community resource: Dental & Vision resources,  self-management and patient's adherence to plan as established by provider. Discussed plans with patient for ongoing care management follow up and provided patient with direct contact information for care management team Evaluation of current treatment plan related to Anxiety & Depression and patient's adherence to plan as established by provider Reviewed medications with patient and discussed use of antidepressant Provided patient with MyChart educational materials related to stress management Reviewed scheduled/upcoming provider appointments including   08/20/22 with Oncology Provieded therapeutic listening Referral to MM LCSW for managing Anxiety and Depression-scheduled on 08/12/22 @ 10:30am   Vitamin D Deficiency  (Status:  New goal.)  Long Term Goal Evaluation of current treatment plan related to  Vitamin D. Deficiency ,  self-management and patient's adherence to plan as established by provider. Discussed plans with patient for ongoing care management follow up and provided patient with direct contact information for care management team Evaluation of current treatment plan related to Vitamin D Deficiency and patient's adherence to plan as established by provider Advised patient to spend 10-15 minutes outside every day, take a walk, sit by an open window Provided education to patient re: Vitamin D rich foods Reviewed medications with patient and discussed the importance of taking as directed Assessed social determinant of health barriers Advised patient to follow up with PCP in 3-4 months for reevaluation   Patient Goals/Self-Care Activities: Take medications as prescribed   Attend all scheduled provider appointments Call pharmacy for medication refills 3-7 days in advance of running out of  medications Call provider office for new concerns or questions  Work with the social worker to address care coordination needs and will continue to work with the clinical team to address health care and disease management related needs       Follow Up:  Patient agrees to Care Plan and Follow-up.  Plan: The Managed Medicaid care management team will reach out to the patient again over the next 60 days.  Date/time of next scheduled RN care management/care coordination outreach:  10/07/22 @ [place] RN, [place] RN Care Coordinator

## 2022-08-12 ENCOUNTER — Other Ambulatory Visit: Payer: Medicaid Other | Admitting: Licensed Clinical Social Worker

## 2022-08-12 NOTE — Patient Instructions (Signed)
Visit Information  Leah Baker was given information about Medicaid Managed Care team care coordination services as a part of their [place] Medicaid benefit. [name] verbally consented to engagement with the [facility] Managed Care team.   If you are experiencing a medical emergency, please call 911 or report to your local emergency department or urgent care.   If you have a non-emergency medical problem during routine business hours, please contact your provider's office and ask to speak with a nurse.   For questions related to your [facility], please call: [phone] or visit the homepage here: [email or web address]  If you would like to schedule transportation through your [facility], please call the following number at least 2 days in advance of your appointment: [phone]   Rides for urgent appointments can also be made after hours by calling Member Services.  Call the [place] at [phone], at any time, 24 hours a day, 7 days a week. If you are in danger or need immediate medical attention call 911.  If you would like help to quit smoking, call 1-800-QUIT-NOW 819-233-2213) OR Espaol: 1-855-Djelo-Ya HD:1601594) o para ms informacin haga clic aqu or Text READY to 200-400 to register via text  [name], [place], MSW, [employer] Managed Medicaid LCSW [name] Phone: [phone]

## 2022-08-12 NOTE — Patient Outreach (Signed)
Medicaid Managed Care Social Work Note  08/12/2022 Name:  Leah Baker MRN:  [id number] [name]:  [birth year]  Leah Baker is an 42 y.o. year old female who is a primary patient of Elsie Stain, MD.  The Medicaid Managed Care Coordination team was consulted for assistance with:  [place] and Resources  Ms. Phair was given information about Medicaid Managed Care Coordination team services today. [name] Patient agreed to services and verbal consent obtained.  Engaged with patient  for by telephone forinitial visit in response to referral for case management and/or care coordination services. 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 She wishes to reschedule [facility] LCSW initial appointment to next week. Email sent to patient.   Assessments/Interventions:  Review of past medical history, allergies, medications, health status, including review of consultants reports, laboratory and other test data, was performed as part of comprehensive evaluation and provision of chronic care management services.  SDOH: (Social Determinant of Health) assessments and interventions performed: SDOH Interventions    Flowsheet Row Patient Outreach Telephone from 08/05/2022 in [place] Patient Outreach Telephone from 07/04/2022 in [place] Office Visit from 06/10/2022 in [place] [facility] at [facility] Patient Outreach Telephone from 03/26/2022 in [place] Patient Outreach Telephone from 02/21/2022 in [place] Patient Outreach Telephone from 07/24/2021 in [place] Interventions        Food Insecurity Interventions Intervention Not Indicated -- -- -- -- Intervention Not Indicated   Housing Interventions -- Intervention Not Indicated -- Other (Comment)  [Patient scheduled with BSW] -- Intervention Not Indicated  Transportation Interventions -- Intervention Not Indicated -- -- Intervention Not Indicated Intervention Not Indicated  Utilities Interventions -- Intervention Not Indicated -- -- -- --  Depression Interventions/Treatment  -- -- Currently on Treatment -- -- Medication  Financial Strain Interventions -- -- -- -- -- Intervention Not Indicated  Physical Activity Interventions -- -- -- -- -- Intervention Not Indicated  Stress Interventions -- -- -- -- -- Intervention Not Indicated  Social Connections Interventions -- -- -- -- -- Intervention Not Indicated       Care Plan                 Allergies  Allergen Reactions   Latex Itching and Rash    Medications Reviewed Today     Reviewed by Melissa Montane, RN (Registered Nurse) on 08/05/22 at [place] List Status: <None>   Medication Order Taking? Sig Documenting Provider Last Dose Status Informant  acetaminophen (TYLENOL) 500 MG tablet [id number] Yes Take 1 tablet (500 mg total) by mouth every 6 (six) hours as needed. Chase Picket, MD Taking Active Self  amLODipine (NORVASC) 10 MG tablet [id number] Yes Take 1 tablet (10 mg total) by mouth daily. [name], MD Taking Active   aspirin-sod bicarb-citric acid (ALKA-SELTZER) 325 MG TBEF tablet [id number] No Take 325 mg by mouth every 6 (six) hours as needed.  Patient not taking: Reported on 08/05/2022   [provider] Not Taking Active   benzonatate (TESSALON) 200 MG capsule [id number] No Take 1 capsule (200 mg total) by mouth 2 (two) times daily as needed for cough.  Patient not taking: Reported on 08/05/2022   Mathis Dad Not Taking Active  Blood Pressure Monitoring (BLOOD PRESSURE KIT) DEVI [id number] Yes Use to measure blood pressure Elsie Stain, MD Taking Active   bupivacaine liposome (EXPAREL) 1.3 % injection 266 mg [id number]    Stark Klein, MD  Active   busPIRone (BUSPAR) 7.5 MG tablet [id number] Yes Take 1 tablet (7.5 mg total) by mouth 3 (three) times daily. [name], MD Taking Active   cetirizine [facility] ALLERGY) 10 MG tablet [id number] Yes Take 1 tablet (10 mg total) by mouth daily. Mayers, Cari S, PA-C Taking Active   diclofenac Sodium (VOLTAREN) 1 % GEL [id number] Yes Apply 2 g topically 4 (four) times daily. Elsie Stain, MD Taking Active   Ferrous Sulfate (IRON) 325 (65 Fe) MG TABS [id number] Yes Take by mouth in the morning and at bedtime. [provider] Taking Active Self           Med Note (ROBB, MELANIE A   Fri Feb 21, 2022  9:20 AM)    fluticasone (FLONASE) 50 MCG/ACT nasal spray [id number] Yes Place 2 sprays into both nostrils daily. Elsie Stain, MD Taking Active   hydrocortisone (ANUSOL-HC) 2.5 % rectal cream [id number] Yes Place 1 application. rectally 2 (two) times daily. Elsie Stain, MD Taking Active   Menthol-Camphor (TIGER [facility] ARTHRITIS RUB EX) [id number] Yes Apply 1 application topically daily as needed (knee pain). [provider] Taking Active Self  norethindrone (AYGESTIN) 5 MG tablet [id number] Yes Take 2 tablets (10 mg total) by mouth daily. [name], MD Taking Active   oxybutynin (DITROPAN) 5 MG tablet [id number] Yes Take 1 tablet (5 mg total) by mouth 3 (three) times daily. Elsie Stain, MD Taking Active   promethazine-dextromethorphan (PROMETHAZINE-DM) 6.25-15 MG/5ML syrup [id number] No Take 5 mLs by mouth 4 (four) times daily as needed for cough.  Patient not taking: Reported on 07/04/2022   Kennieth Rad, PA-C Not Taking Active            Med Note (ROBB, MELANIE A   Fri Jul 04, 2022  9:12 AM) Ran out  psyllium (METAMUCIL) 58.6 % packet [id number] Yes Take 1 packet by mouth daily. [provider] Taking Active   sertraline (ZOLOFT) 100 MG tablet [id number] Yes Take 1 tablet (100 mg total) by mouth at bedtime. Elsie Stain, MD Taking  Active   Vitamin D, Ergocalciferol, (DRISDOL) 1.25 MG (50000 UNIT) CAPS capsule [id number] Yes Take 1 capsule (50,000 Units total) by mouth every 7 (seven) days. 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  Date/time of next scheduled Social Work care management/care coordination outreach:  08/19/22 at [place] am.  [name],  BSW, MSW, [employer] Managed Medicaid LCSW [email or web address] Phone: [phone]

## 2022-08-19 ENCOUNTER — Encounter (HOSPITAL_BASED_OUTPATIENT_CLINIC_OR_DEPARTMENT_OTHER): Payer: Medicaid Other | Admitting: Critical Care Medicine

## 2022-08-19 ENCOUNTER — Other Ambulatory Visit: Payer: Medicaid Other | Admitting: Licensed Clinical Social Worker

## 2022-08-19 ENCOUNTER — Other Ambulatory Visit: Payer: Self-pay

## 2022-08-19 DIAGNOSIS — D500 Iron deficiency anemia secondary to blood loss (chronic): Secondary | ICD-10-CM

## 2022-08-19 DIAGNOSIS — H66006 Acute suppurative otitis media without spontaneous rupture of ear drum, recurrent, bilateral: Secondary | ICD-10-CM | POA: Diagnosis not present

## 2022-08-19 MED ORDER — AMOXICILLIN-POT CLAVULANATE 875-125 MG PO TABS: 1.0000 | ORAL_TABLET | Freq: Two times a day (BID) | ORAL | 0 refills | Status: DC

## 2022-08-19 NOTE — Patient Instructions (Signed)
Visit Information  Leah Baker was given information about Medicaid Managed Care team care coordination services as a part of [name]. [name] verbally consented to engagement with the [facility] Managed Care team.   If you are experiencing a medical emergency, please call 911 or report to your local emergency department or urgent care.   If you have a non-emergency medical problem during routine business hours, please contact your provider's office and ask to speak with a nurse.   For questions related to your [facility], please call: [phone] or visit the homepage here: [email or web address]  If you would like to schedule transportation through your [facility], please call the following number at least 2 days in advance of your appointment: [phone]   Rides for urgent appointments can also be made after hours by calling Member Services.  Call the [place] at [phone], at any time, 24 hours a day, 7 days a week. If you are in danger or need immediate medical attention call 911.  If you would like help to quit smoking, call 1-800-QUIT-NOW 731 793 4977) OR Espaol: 1-855-Djelo-Ya HD:1601594) o para ms informacin haga clic aqu or Text READY to 200-400 to register via text

## 2022-08-19 NOTE — Patient Outreach (Signed)
Medicaid Managed Care Social Work Note  08/19/2022 Name:  Leah Baker MRN:  [id number] [name]:  09/04/1980  Leah Baker is an 42 y.o. year old female who is a primary patient of Elsie Stain, MD.  The Medicaid Managed Care Coordination team was consulted for assistance with:  [place] and Resources  [name] was given information about Medicaid Managed Care Coordination team services today. [name] Patient agreed to services and verbal consent obtained.  Engaged with patient  for by telephone forinitial visit in response to referral for case management and/or care coordination services.   Assessments/Interventions:  Review of past medical history, allergies, medications, health status, including review of consultants reports, laboratory and other test data, was performed as part of comprehensive evaluation and provision of chronic care management services.  SDOH: (Social Determinant of Health) assessments and interventions performed: SDOH Interventions    Flowsheet Row Patient Outreach Telephone from 08/19/2022 in [place] Patient Outreach Telephone from 08/05/2022 in [place] Patient Outreach Telephone from 07/04/2022 in [place] Office Visit from 06/10/2022 in [place] [facility] Patient Outreach Telephone from 03/26/2022 in [place] Patient Outreach Telephone from 02/21/2022 in [place] Coordination  SDOH Interventions        Food Insecurity Interventions -- Intervention Not Indicated -- -- -- --  Housing Interventions -- -- Intervention Not Indicated -- Other (Comment)  [Patient scheduled with BSW] --  Transportation Interventions -- -- Intervention Not Indicated -- -- Intervention Not Indicated  Utilities Interventions -- -- Intervention Not Indicated -- -- --  Depression  Interventions/Treatment  -- -- -- Currently on Treatment -- --  Stress Interventions Offered [employer], Provide Counseling -- -- -- -- --       Advanced Directives Status:  Not addressed in this encounter.  Care Plan                 Allergies  Allergen Reactions   Latex Itching and Rash    Medications Reviewed Today     Reviewed by Melissa Montane, RN (Registered Nurse) on 08/05/22 at [place] List Status: <None>   Medication Order Taking? Sig Documenting Provider Last Dose Status Informant  acetaminophen (TYLENOL) 500 MG tablet [id number] Yes Take 1 tablet (500 mg total) by mouth every 6 (six) hours as needed. Chase Picket, MD Taking Active Self  amLODipine (NORVASC) 10 MG tablet [id number] Yes Take 1 tablet (10 mg total) by mouth daily. [name], MD Taking Active   aspirin-sod bicarb-citric acid (ALKA-SELTZER) 325 MG TBEF tablet [id number] No Take 325 mg by mouth every 6 (six) hours as needed.  Patient not taking: Reported on 08/05/2022   [provider] Not Taking Active   benzonatate (TESSALON) 200 MG capsule [id number] No Take 1 capsule (200 mg total) by mouth 2 (two) times daily as needed for cough.  Patient not taking: Reported on 08/05/2022   Mathis Dad Not Taking Active   Blood Pressure Monitoring (BLOOD PRESSURE KIT) DEVI [id number] Yes Use to measure blood pressure Elsie Stain, MD Taking Active   bupivacaine liposome (EXPAREL) 1.3 % injection 266 mg [id number]   Stark Klein, MD  Active   busPIRone (BUSPAR) 7.5 MG tablet [id number] Yes Take 1 tablet (7.5 mg total) by mouth 3 (three) times daily. [name], MD Taking Active   cetirizine (  ZYRTEC ALLERGY) 10 MG tablet [id number] Yes Take 1 tablet (10 mg total) by mouth daily. Mayers, Cari S, PA-C Taking Active   diclofenac Sodium (VOLTAREN) 1 % GEL [id number] Yes Apply 2 g topically 4 (four) times daily. Elsie Stain, MD Taking Active   Ferrous Sulfate (IRON) 325 (65  Fe) MG TABS [id number] Yes Take by mouth in the morning and at bedtime. [provider] Taking Active Self           Med Note (ROBB, MELANIE A   Fri Feb 21, 2022  9:20 AM)    fluticasone (FLONASE) 50 MCG/ACT nasal spray [id number] Yes Place 2 sprays into both nostrils daily. Elsie Stain, MD Taking Active   hydrocortisone (ANUSOL-HC) 2.5 % rectal cream [id number] Yes Place 1 application. rectally 2 (two) times daily. Elsie Stain, MD Taking Active   Menthol-Camphor (TIGER [facility] ARTHRITIS RUB EX) [id number] Yes Apply 1 application topically daily as needed (knee pain). [provider] Taking Active Self  norethindrone (AYGESTIN) 5 MG tablet [id number] Yes Take 2 tablets (10 mg total) by mouth daily. [name], MD Taking Active   oxybutynin (DITROPAN) 5 MG tablet [id number] Yes Take 1 tablet (5 mg total) by mouth 3 (three) times daily. Elsie Stain, MD Taking Active   promethazine-dextromethorphan (PROMETHAZINE-DM) 6.25-15 MG/5ML syrup [id number] No Take 5 mLs by mouth 4 (four) times daily as needed for cough.  Patient not taking: Reported on 07/04/2022   Kennieth Rad, PA-C Not Taking Active            Med Note (ROBB, MELANIE A   Fri Jul 04, 2022  9:12 AM) Ran out  psyllium (METAMUCIL) 58.6 % packet [id number] Yes Take 1 packet by mouth daily. [provider] Taking Active   sertraline (ZOLOFT) 100 MG tablet [id number] Yes Take 1 tablet (100 mg total) by mouth at bedtime. Elsie Stain, MD Taking Active   Vitamin D, Ergocalciferol, (DRISDOL) 1.25 MG (50000 UNIT) CAPS capsule [id number] Yes Take 1 capsule (50,000 Units total) by mouth every 7 (seven) days. [place] Donovan, [place] Taking Active             Patient Active Problem List   Diagnosis Date Noted   Recurrent acute suppurative otitis media without spontaneous rupture of tympanic membrane of both sides 08/19/2022   Primary hypertension 12/10/2021   Medication side effect 10/03/2021   Dysuria  10/03/2021   Hereditary hemochromatosis ([place]) 09/17/2021   Alopecia 09/17/2021   Dental caries 09/17/2021   Carrier of hemochromatosis HFE gene mutation 08/07/2020   Lymphoid hyperplasia 07/30/2020   Anxiety 06/22/2020   Menorrhagia 11/27/2018   Rectal bleeding 11/26/2018   Liver lesion 09/09/2018   Hemorrhoid 11/29/2014   Iron deficiency anemia 05/04/2014   Mood disorder ([place]) 02/20/2014   Tears of meniscus and ACL of left knee 09/08/2013   Chronic pain of left knee 05/19/2013   Depression 05/14/2011   H/O: cesarean section 08/28/2010    Conditions to be addressed/monitored per PCP order:  Depression  Patient reports that she does not wish to start a goal on gaining mental health support at this time and only needs resource email. Email sent to patient with resources for herself AND family. Patient will be contacted in two weeks to ensure that she was provided with all of her social work needs before completing case closure.   [name], BSW, MSW, [employer] Managed Medicaid LCSW [email or web address] Phone: [phone]

## 2022-08-19 NOTE — Telephone Encounter (Signed)
Please see the MyChart message reply(ies) for my assessment and plan.  ?  ?This patient gave consent for this Medical Advice Message and is aware that it may result in a bill to their insurance company, as well as the possibility of receiving a bill for a co-payment or deductible. They are an established patient, but are not seeking medical advice exclusively about a problem treated during an in person or video visit in the last seven days. I did not recommend an in person or video visit within seven days of my reply.  ?  ?I spent a total of 8 minutes cumulative time within 7 days through MyChart messaging. ? ?Caramia Boutin, MD   ?

## 2022-08-20 ENCOUNTER — Telehealth: Payer: Self-pay | Admitting: Hematology

## 2022-08-20 ENCOUNTER — Inpatient Hospital Stay: Payer: Medicaid Other

## 2022-08-20 ENCOUNTER — Inpatient Hospital Stay: Payer: Medicaid Other | Admitting: Hematology

## 2022-08-20 ENCOUNTER — Encounter: Payer: Self-pay | Admitting: Hematology

## 2022-08-20 NOTE — Telephone Encounter (Signed)
Called patient to reschedule 3/20 appointments due to feeling ill. Left voicemail with new appointment information and contact details if needing to reschedule.

## 2022-09-17 ENCOUNTER — Telehealth: Payer: Self-pay | Admitting: Hematology

## 2022-09-29 ENCOUNTER — Inpatient Hospital Stay: Payer: Medicaid Other | Admitting: Hematology

## 2022-09-29 ENCOUNTER — Inpatient Hospital Stay: Payer: Medicaid Other

## 2022-10-07 ENCOUNTER — Other Ambulatory Visit: Payer: Medicaid Other | Admitting: *Deleted

## 2022-10-07 NOTE — Patient Outreach (Signed)
Care Coordination  10/07/2022  DEKLYN LIKELY Dec 07, 1980 [id number]   Successful telephone outreach with Ms. Leakey today. However, she was at an appointment with her daughter and request to reschedule this appointment. A new appointment was made for 10/09/22 @ 11:15 am. Patient agreed to new date and time.  Estanislado Emms RN, BSN [place]  Managed [facility] RN Care Coordinator [phone]

## 2022-10-09 ENCOUNTER — Other Ambulatory Visit: Payer: Medicaid Other | Admitting: *Deleted

## 2022-10-09 ENCOUNTER — Encounter: Payer: Self-pay | Admitting: *Deleted

## 2022-10-09 NOTE — Patient Instructions (Signed)
Visit Information  Leah Baker was given information about Medicaid Managed Care team care coordination services as a part of their [facility] Medicaid benefit. Leah Baker verbally consented to engagement with the [facility] Managed Care team.   If you are experiencing a medical emergency, please call 911 or report to your local emergency department or urgent care.   If you have a non-emergency medical problem during routine business hours, please contact your provider's office and ask to speak with a nurse.   For questions related to your [facility], please call: [phone] or visit the homepage here: [email or web address]  If you would like to schedule transportation through your [facility], please call the following number at least 2 days in advance of your appointment: [phone]   Rides for urgent appointments can also be made after hours by calling Member Services.  Call the Behavioral Health Crisis Line at [phone]) [phone], at any time, 24 hours a day, 7 days a week. If you are in danger or need immediate medical attention call 911.  If you would like help to quit smoking, call 1-800-QUIT-NOW (814-486-3998) OR Espaol: 1-855-Djelo-Ya (1-324-401-0272) o para ms informacin haga clic aqu or Text READY to 536-644 to register via text  Leah Baker,   Please see education materials related to managing anxiety provided by MyChart link.  Patient verbalizes understanding of instructions and care plan provided today and agrees to view in MyChart. Active MyChart status and patient understanding of how to access instructions and care plan via MyChart confirmed with patient.     Telephone follow up appointment with Managed Medicaid care management team member scheduled for:12/09/22 @ 9am  Leah Emms RN, BSN [place]  Managed [facility] RN Care  Coordinator [phone]   Following is a copy of your plan of care:  Care Plan : RN Care Manager Plan of Care  Updates made by [name], RN since 10/09/2022 12:00 AM     Problem: Chronic Disease Management and Care Coordination Needs for Iron Deficiency Anemia, Internal hemorrhoid, Anxiety & Depression   Priority: High     Long-Range Goal: Development of Plan of Care for Chronic Disease Management and Care Coordination Needs (Iron Deficiency Anemia & Bleeding due to internal hemorroid; Anxiety & Depression)   Start Date: 07/24/2021  Expected End Date: 10/31/2022  Priority: High  Note:   Current Barriers:  Knowledge Deficits related to plan of care for management of Anxiety with Excessive Worry, Social Anxiety, and Depression: anxiety loss of energy/fatigue disturbed sleep decreased appetite and Iron Deficiency Anemia & Bleeding due to internal hemorrhoid  Care Coordination needs related to Lacks knowledge of community resource: Dental & Vision resources  Chronic Disease Management support and education needs related to Anxiety with Excessive Worry, Social Anxiety,, Depression: anxiety loss of energy/fatigue disturbed sleep decreased appetite, and Iron Deficiency Anemia  and Bleeding due to internal hemorroid  RNCM Clinical Goal(s):  RNCM will help patient verbalize understanding of plan for management of Anxiety, Depression, and Iron Deficiency Anemia & bleeding due to internal hemorrhoid as evidenced by providing education regarding these chronic diseases. verbalize basic understanding of Anxiety, Depression, and Iron Deficiency Anemia & bleeding due to internal hemorrhoid disease process and self health management plan as evidenced by improved management of these chronic diseases take all medications exactly as prescribed and will call provider for medication related questions as evidenced by medication compliance    attend all scheduled medical appointments:  11/03/22  with  Oncology and 12/22/22 with GI; as evidenced by attending all scheduled appointments        demonstrate ongoing adherence to prescribed treatment plan for Anxiety, Depression, and Iron Deficiency Anemia & bleeding due to internal hemorrhoid as evidenced by patient being compliant with treatment plan. continue to work with Medical illustrator and/or Social Worker to address care management and care coordination needs related to Anxiety, Depression, and Iron Deficiency Anemia & Bleeding due to internal hemorrhoid  as evidenced by adherence to CM Team Scheduled appointments     work with Child psychotherapist to address [employer] knowledge of community resource: Dental and Vision resources related to the management of Anxiety, Depression, and Iron Deficiency Anemia & bleeding due to internal hemorrhoid as evidenced by review of EMR and patient or Child psychotherapist report     through collaboration with Medical illustrator, provider, and care team.   Interventions: Inter-disciplinary care team collaboration (see longitudinal plan of care) Evaluation of current treatment plan related to  self management and patient's adherence to plan as established by provider   Anxiety & Depression  (Status:  Goal on track:  Yes.)  Long Term Goal Evaluation of current treatment plan related to Anxiety and Depression, Lacks knowledge of community resource: Dental & Vision resources,  self-management and patient's adherence to plan as established by provider. Discussed plans with patient for ongoing care management follow up and provided patient with direct contact information for care management team Evaluation of current treatment plan related to Anxiety & Depression and patient's adherence to plan as established by provider Reviewed medications with patient and discussed use of antidepressant Provided patient with MyChart educational materials related to stress management Reviewed scheduled/upcoming provider appointments including   11/03/22 with  Oncology Provieded therapeutic listening Discussed re involvement with LCSW-patient agreed Collaborated with LCSW for follow up, scheduled 10/16/22 @ 9am   Vitamin D Deficiency  (Status:  Goal on track:  Yes.)  Long Term Goal Evaluation of current treatment plan related to  Vitamin D. Deficiency ,  self-management and patient's adherence to plan as established by provider. Discussed plans with patient for ongoing care management follow up and provided patient with direct contact information for care management team Evaluation of current treatment plan related to Vitamin D Deficiency and patient's adherence to plan as established by provider Advised patient to spend 10-15 minutes outside every day, take a walk, sit by an open window Provided education to patient re: Vitamin D rich foods Reviewed medications with patient and discussed the importance of taking as directed Assessed social determinant of health barriers Advised patient to follow up with PCP for reevaluation   Patient Goals/Self-Care Activities: Take medications as prescribed   Attend all scheduled provider appointments Call pharmacy for medication refills 3-7 days in advance of running out of medications Call provider office for new concerns or questions  Work with the social worker to address care coordination needs and will continue to work with the clinical team to address health care and disease management related needs

## 2022-10-09 NOTE — Patient Outreach (Signed)
Medicaid Managed Care   Nurse Care Manager Note  10/09/2022 Name:  Leah Baker MRN:  [id number] [name]:  1981/04/08  Leah Baker is an 42 y.o. year old female who is a primary patient of Storm Frisk, MD.  The [facility] Managed Care Coordination team was consulted for assistance with:    Vit D Deficiency Anxiety and Depression  Ms. Brookens was given information about Medicaid Managed Care Coordination team services today. Leah Baker Patient agreed to services and verbal consent obtained.  Engaged with patient by telephone for follow up visit in response to provider referral for case management and/or care coordination services.   Assessments/Interventions:  Review of past medical history, allergies, medications, health status, including review of consultants reports, laboratory and other test data, was performed as part of comprehensive evaluation and provision of chronic care management services.  SDOH (Social Determinants of Health) assessments and interventions performed: SDOH Interventions    Flowsheet Row Patient Outreach Telephone from 10/09/2022 in [place] POPULATION HEALTH DEPARTMENT Patient Outreach Telephone from 08/19/2022 in [place] POPULATION HEALTH DEPARTMENT Patient Outreach Telephone from 08/05/2022 in [place] POPULATION HEALTH DEPARTMENT Patient Outreach Telephone from 07/04/2022 in [place] POPULATION HEALTH DEPARTMENT Office Visit from 06/10/2022 in [facility] Patient Outreach Telephone from 03/26/2022 in [facility] Care Coordination  SDOH Interventions        Food Insecurity Interventions Intervention Not Indicated -- Intervention Not Indicated -- -- --  Housing Interventions -- -- -- Intervention Not Indicated -- Other (Comment)  [Patient scheduled with BSW]  Transportation Interventions Intervention Not Indicated -- -- Intervention Not Indicated -- --  Utilities Interventions -- -- -- Intervention  Not Indicated -- --  Depression Interventions/Treatment  -- -- -- -- Currently on Treatment --  Stress Interventions -- [employer], Provide Counseling -- -- -- --       Care Plan  Allergies  Allergen Reactions   Latex Itching and Rash    Medications Reviewed Today     Reviewed by [name], RN (Registered Nurse) on 10/09/22 at 1139  Med List Status: <None>   Medication Order Taking? Sig Documenting Provider Last Dose Status Informant  acetaminophen (TYLENOL) 500 MG tablet [id number] Yes Take 1 tablet (500 mg total) by mouth every 6 (six) hours as needed. Merrilee Jansky, MD Taking Active Self  amLODipine (NORVASC) 10 MG tablet [id number] Yes Take 1 tablet (10 mg total) by mouth daily. Reva Bores, MD Taking Active   amoxicillin-clavulanate (AUGMENTIN) 875-125 MG tablet [id number] No Take 1 tablet by mouth 2 (two) times daily.  Patient not taking: Reported on 10/09/2022   Storm Frisk, MD Not Taking Active            Med Note Ardelia Mems, Rona Tomson A   Thu Oct 09, 2022 11:37 AM) completed  aspirin-sod bicarb-citric acid (ALKA-SELTZER) 325 MG TBEF tablet [id number] No Take 325 mg by mouth every 6 (six) hours as needed.  Patient not taking: Reported on 08/05/2022   [provider] Not Taking Active   benzonatate (TESSALON) 200 MG capsule [id number] No Take 1 capsule (200 mg total) by mouth 2 (two) times daily as needed for cough.  Patient not taking: Reported on 08/05/2022   Bary Richard Not Taking Active   Blood Pressure Monitoring (BLOOD PRESSURE KIT) DEVI [id number] Yes Use to measure blood pressure Storm Frisk, MD Taking Active   busPIRone (BUSPAR) 7.5 MG tablet  [id number] Yes Take 1 tablet (7.5 mg total) by mouth 3 (three) times daily. Reva Bores, MD Taking Active   cetirizine [facility] ALLERGY) 10 MG tablet [id number] Yes Take 1 tablet (10 mg total) by mouth daily. Mayers, Cari S, PA-C Taking Active   diclofenac Sodium (VOLTAREN) 1  % GEL [id number] Yes Apply 2 g topically 4 (four) times daily. Storm Frisk, MD Taking Active   Ferrous Sulfate (IRON) 325 (65 Fe) MG TABS [id number] Yes Take by mouth in the morning and at bedtime. [provider] Taking Active Self           Med Note (Nathania Waldman A   Fri Feb 21, 2022  9:20 AM)    fluticasone (FLONASE) 50 MCG/ACT nasal spray [id number] Yes Place 2 sprays into both nostrils daily. Storm Frisk, MD Taking Active   hydrocortisone (ANUSOL-HC) 2.5 % rectal cream [id number] Yes Place 1 application. rectally 2 (two) times daily. Storm Frisk, MD Taking Active   Menthol-Camphor (TIGER [facility] ARTHRITIS RUB EX) [id number] Yes Apply 1 application topically daily as needed (knee pain). [provider] Taking Active Self  norethindrone (AYGESTIN) 5 MG tablet [id number] Yes Take 2 tablets (10 mg total) by mouth daily. Reva Bores, MD Taking Active   oxybutynin (DITROPAN) 5 MG tablet [id number] No Take 1 tablet (5 mg total) by mouth 3 (three) times daily.  Patient not taking: Reported on 10/09/2022   Storm Frisk, MD Not Taking Active            Med Note Ardelia Mems, Assata Juncaj A   Thu Oct 09, 2022 11:36 AM) Needs refill  promethazine-dextromethorphan (PROMETHAZINE-DM) 6.25-15 MG/5ML syrup [id number] No Take 5 mLs by mouth 4 (four) times daily as needed for cough.  Patient not taking: Reported on 07/04/2022   Roney Jaffe, PA-C Not Taking Active            Med Note (Emmaleah Meroney A   Fri Jul 04, 2022  9:12 AM) Ran out  psyllium (METAMUCIL) 58.6 % packet [id number] Yes Take 1 packet by mouth daily. [provider] Taking Active   sertraline (ZOLOFT) 100 MG tablet [id number] Yes Take 1 tablet (100 mg total) by mouth at bedtime. Storm Frisk, MD Taking Active   Vitamin D, Ergocalciferol, (DRISDOL) 1.25 MG (50000 UNIT) CAPS capsule [id number] Yes Take 1 capsule (50,000 Units total) by mouth every 7 (seven) days. Anders Simmonds, [place] Taking Active              Patient Active Problem List   Diagnosis Date Noted   Recurrent acute suppurative otitis media without spontaneous rupture of tympanic membrane of both sides 08/19/2022   Primary hypertension 12/10/2021   Medication side effect 10/03/2021   Dysuria 10/03/2021   Hereditary hemochromatosis (HCC) 09/17/2021   Alopecia 09/17/2021   Dental caries 09/17/2021   Carrier of hemochromatosis HFE gene mutation 08/07/2020   Lymphoid hyperplasia 07/30/2020   Anxiety 06/22/2020   Menorrhagia 11/27/2018   Rectal bleeding 11/26/2018   Liver lesion 09/09/2018   Hemorrhoid 11/29/2014   Iron deficiency anemia 05/04/2014   Mood disorder (HCC) 02/20/2014   Tears of meniscus and ACL of left knee 09/08/2013   Chronic pain of left knee 05/19/2013   Depression 05/14/2011   H/O: cesarean section 08/28/2010    Conditions to be addressed/monitored per PCP order:  Anxiety, Depression, and Vit D deficiency  Care Plan : RN Care Manager Plan of Care  Updates made by [name],  RN since 10/09/2022 12:00 AM     Problem: Chronic Disease Management and Care Coordination Needs for Iron Deficiency Anemia, Internal hemorrhoid, Anxiety & Depression   Priority: High     Long-Range Goal: Development of Plan of Care for Chronic Disease Management and Care Coordination Needs (Iron Deficiency Anemia & Bleeding due to internal hemorroid; Anxiety & Depression)   Start Date: 07/24/2021  Expected End Date: 10/31/2022  Priority: High  Note:   Current Barriers:  Knowledge Deficits related to plan of care for management of Anxiety with Excessive Worry, Social Anxiety, and Depression: anxiety loss of energy/fatigue disturbed sleep decreased appetite and Iron Deficiency Anemia & Bleeding due to internal hemorrhoid  Care Coordination needs related to Lacks knowledge of community resource: Dental & Vision resources  Chronic Disease Management support and education needs related to Anxiety with Excessive Worry, Social  Anxiety,, Depression: anxiety loss of energy/fatigue disturbed sleep decreased appetite, and Iron Deficiency Anemia  and Bleeding due to internal hemorroid  RNCM Clinical Goal(s):  RNCM will help patient verbalize understanding of plan for management of Anxiety, Depression, and Iron Deficiency Anemia & bleeding due to internal hemorrhoid as evidenced by providing education regarding these chronic diseases. verbalize basic understanding of Anxiety, Depression, and Iron Deficiency Anemia & bleeding due to internal hemorrhoid disease process and self health management plan as evidenced by improved management of these chronic diseases take all medications exactly as prescribed and will call provider for medication related questions as evidenced by medication compliance    attend all scheduled medical appointments:  11/03/22 with Oncology and 12/22/22 with GI; as evidenced by attending all scheduled appointments        demonstrate ongoing adherence to prescribed treatment plan for Anxiety, Depression, and Iron Deficiency Anemia & bleeding due to internal hemorrhoid as evidenced by patient being compliant with treatment plan. continue to work with Medical illustrator and/or Social Worker to address care management and care coordination needs related to Anxiety, Depression, and Iron Deficiency Anemia & Bleeding due to internal hemorrhoid  as evidenced by adherence to CM Team Scheduled appointments     work with Child psychotherapist to address [employer] knowledge of community resource: Dental and Vision resources related to the management of Anxiety, Depression, and Iron Deficiency Anemia & bleeding due to internal hemorrhoid as evidenced by review of EMR and patient or Child psychotherapist report     through collaboration with Medical illustrator, provider, and care team.   Interventions: Inter-disciplinary care team collaboration (see longitudinal plan of care) Evaluation of current treatment plan related to  self management and  patient's adherence to plan as established by provider   Anxiety & Depression  (Status:  Goal on track:  Yes.)  Long Term Goal Evaluation of current treatment plan related to Anxiety and Depression, Lacks knowledge of community resource: Dental & Vision resources,  self-management and patient's adherence to plan as established by provider. Discussed plans with patient for ongoing care management follow up and provided patient with direct contact information for care management team Evaluation of current treatment plan related to Anxiety & Depression and patient's adherence to plan as established by provider Reviewed medications with patient and discussed use of antidepressant Provided patient with MyChart educational materials related to stress management Reviewed scheduled/upcoming provider appointments including   11/03/22 with Oncology Provieded therapeutic listening Discussed re involvement with LCSW-patient agreed Collaborated with LCSW for follow up, scheduled 10/16/22 @ 9am   Vitamin D Deficiency  (Status:  Goal on track:  Yes.)  Long Term Goal Evaluation of current treatment plan related to  Vitamin D. Deficiency ,  self-management and patient's adherence to plan as established by provider. Discussed plans with patient for ongoing care management follow up and provided patient with direct contact information for care management team Evaluation of current treatment plan related to Vitamin D Deficiency and patient's adherence to plan as established by provider Advised patient to spend 10-15 minutes outside every day, take a walk, sit by an open window Provided education to patient re: Vitamin D rich foods Reviewed medications with patient and discussed the importance of taking as directed Assessed social determinant of health barriers Advised patient to follow up with PCP for reevaluation   Patient Goals/Self-Care Activities: Take medications as prescribed   Attend all scheduled  provider appointments Call pharmacy for medication refills 3-7 days in advance of running out of medications Call provider office for new concerns or questions  Work with the social worker to address care coordination needs and will continue to work with the clinical team to address health care and disease management related needs       Follow Up:  Patient agrees to Care Plan and Follow-up.  Plan: The Managed Medicaid care management team will reach out to the patient again over the next 60 days.  Date/time of next scheduled RN care management/care coordination outreach:  12/09/22 at 9am  Estanislado Emms RN, BSN [place]  Managed [facility] RN Care Coordinator [phone]

## 2022-10-16 ENCOUNTER — Other Ambulatory Visit: Payer: Medicaid Other | Admitting: Licensed Clinical Social Worker

## 2022-10-16 NOTE — Patient Instructions (Signed)
Visit Information  Ms. Newberry was given information about Medicaid Managed Care team care coordination services as a part of their [employer] Community Plan Medicaid benefit. Rhys D Barton verbally consented to engagement with the Medicaid Managed Care team.   If you are experiencing a medical emergency, please call 911 or report to your local emergency department or urgent care.   If you have a non-emergency medical problem during routine business hours, please contact your provider's office and ask to speak with a nurse.   For questions related to your [employer] Community Plan Medicaid, please call: [phone] or visit the homepage here: [email or web address]  If you would like to schedule transportation through your [employer] Community Plan Medicaid, please call the following number at least 2 days in advance of your appointment: [phone]   Rides for urgent appointments can also be made after hours by calling Member Services.  Call the Behavioral Health Crisis Line at 1-877-334-1141, at any time, 24 hours a day, 7 days a week. If you are in danger or need immediate medical attention call 911.  If you would like help to quit smoking, call 1-800-QUIT-NOW (1-800-784-8669) OR Espaol: 1-855-Djelo-Ya (1-855-335-3569) o para ms informacin haga clic aqu or Text READY to 200-400 to register via text  Jaxx Huish, BSW, MSW, LCSW Managed Medicaid LCSW [place]  [facility] [email or web address] [email or web address] Phone: [phone]     

## 2022-10-16 NOTE — Patient Outreach (Signed)
Medicaid Managed Care Social Work Note  10/16/2022 Name:  Leah Baker MRN:  [id number] [name]:  03-Dec-1980  Leah Baker is an 42 y.o. year old female who is a primary patient of Leah Frisk, MD.  The Medicaid Managed Care Coordination team was consulted for assistance with:  Mental Health Counseling and Resources  Ms. Reineck was given information about Medicaid Managed Care Coordination team services today. Leah Baker Patient agreed to services and verbal consent obtained.  Engaged with patient  for by telephone forfollow up visit in response to referral for case management and/or care coordination services.   Assessments/Interventions:  Review of past medical history, allergies, medications, health status, including review of consultants reports, laboratory and other test data, was performed as part of comprehensive evaluation and provision of chronic care management services.  SDOH: (Social Determinant of Health) assessments and interventions performed: SDOH Interventions    Flowsheet Row Patient Outreach Telephone from 10/16/2022 in [place] POPULATION HEALTH DEPARTMENT Patient Outreach Telephone from 10/09/2022 in [place] POPULATION HEALTH DEPARTMENT Patient Outreach Telephone from 08/19/2022 in [place] POPULATION HEALTH DEPARTMENT Patient Outreach Telephone from 08/05/2022 in [place] POPULATION HEALTH DEPARTMENT Patient Outreach Telephone from 07/04/2022 in [place] POPULATION HEALTH DEPARTMENT Office Visit from 06/10/2022 in [facility]  SDOH Interventions        Food Insecurity Interventions -- Intervention Not Indicated -- Intervention Not Indicated -- --  Housing Interventions -- -- -- -- Intervention Not Indicated --  Transportation Interventions -- Intervention Not Indicated -- -- Intervention Not Indicated --  Utilities Interventions -- -- -- -- Intervention Not Indicated --  Depression Interventions/Treatment  -- -- -- -- --  Currently on Treatment  Stress Interventions Offered [employer], Provide Counseling  [Reports interest in going back to old therapist. Resource provided. 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 She denies any further social work needs and reports appreciatition for the resource, support and follow up. She reports interest in speaking with a past counselor Hulda Marin that she built great rapport with and that resource information was provided to patient. [facility] LCSW routed encounter to [facility] as well. [facility] LCSW will sign off at this time as patient denies further social work needs but did save this [facility] LCSW's number in case she has any questions or concerns.   Follow up:  Patient requests no follow-up at this time. [facility] RNCM will follow up with patient  in 90 days.   Dickie La, BSW, MSW, [employer] Managed Medicaid LCSW [facility]  [facility] [email or web address] Phone: [phone]

## 2022-11-03 ENCOUNTER — Encounter: Payer: Self-pay | Admitting: Critical Care Medicine

## 2022-11-03 ENCOUNTER — Inpatient Hospital Stay: Payer: Medicaid Other | Attending: Hematology

## 2022-11-03 ENCOUNTER — Inpatient Hospital Stay (HOSPITAL_BASED_OUTPATIENT_CLINIC_OR_DEPARTMENT_OTHER): Payer: Medicaid Other | Admitting: Hematology

## 2022-11-03 ENCOUNTER — Telehealth: Payer: Self-pay | Admitting: Family Medicine

## 2022-11-03 VITALS — BP 163/105 | HR 88 | Temp 97.7°F | Resp 16 | Wt 166.9 lb

## 2022-11-03 DIAGNOSIS — D500 Iron deficiency anemia secondary to blood loss (chronic): Secondary | ICD-10-CM | POA: Diagnosis not present

## 2022-11-03 DIAGNOSIS — R102 Pelvic and perineal pain: Secondary | ICD-10-CM

## 2022-11-03 DIAGNOSIS — N921 Excessive and frequent menstruation with irregular cycle: Secondary | ICD-10-CM

## 2022-11-03 DIAGNOSIS — D509 Iron deficiency anemia, unspecified: Secondary | ICD-10-CM | POA: Insufficient documentation

## 2022-11-03 LAB — CBC WITH DIFFERENTIAL (CANCER CENTER ONLY)
Abs Immature Granulocytes: 0.03 10*3/uL (ref 0.00–0.07)
Basophils Absolute: 0 10*3/uL (ref 0.0–0.1)
Basophils Relative: 1 %
Eosinophils Absolute: 0.1 10*3/uL (ref 0.0–0.5)
Eosinophils Relative: 1 %
HCT: 39.1 % (ref 36.0–46.0)
Hemoglobin: 12.9 g/dL (ref 12.0–15.0)
Immature Granulocytes: 1 %
Lymphocytes Relative: 28 %
Lymphs Abs: 1.8 10*3/uL (ref 0.7–4.0)
MCH: 30.1 pg (ref 26.0–34.0)
MCHC: 33 g/dL (ref 30.0–36.0)
MCV: 91.4 fL (ref 80.0–100.0)
Monocytes Absolute: 0.5 10*3/uL (ref 0.1–1.0)
Monocytes Relative: 8 %
Neutro Abs: 4 10*3/uL (ref 1.7–7.7)
Neutrophils Relative %: 61 %
Platelet Count: 423 10*3/uL — ABNORMAL HIGH (ref 150–400)
RBC: 4.28 MIL/uL (ref 3.87–5.11)
RDW: 13.1 % (ref 11.5–15.5)
WBC Count: 6.4 10*3/uL (ref 4.0–10.5)
nRBC: 0 % (ref 0.0–0.2)

## 2022-11-03 LAB — FERRITIN: Ferritin: 151 ng/mL (ref 11–307)

## 2022-11-03 LAB — CMP (CANCER CENTER ONLY)
ALT: 14 U/L (ref 0–44)
AST: 17 U/L (ref 15–41)
Albumin: 4.7 g/dL (ref 3.5–5.0)
Alkaline Phosphatase: 39 U/L (ref 38–126)
Anion gap: 9 (ref 5–15)
BUN: 16 mg/dL (ref 6–20)
CO2: 19 mmol/L — ABNORMAL LOW (ref 22–32)
Calcium: 9.5 mg/dL (ref 8.9–10.3)
Chloride: 108 mmol/L (ref 98–111)
Creatinine: 0.99 mg/dL (ref 0.44–1.00)
GFR, Estimated: >60 mL/min (ref >60)
Glucose, Bld: 103 mg/dL — ABNORMAL HIGH (ref 70–99)
Potassium: 3.7 mmol/L (ref 3.5–5.1)
Sodium: 136 mmol/L (ref 135–145)
Total Bilirubin: 0.4 mg/dL (ref 0.3–1.2)
Total Protein: 7.8 g/dL (ref 6.5–8.1)

## 2022-11-03 LAB — IRON AND IRON BINDING CAPACITY (CC-WL,HP ONLY)
Iron: 152 ug/dL (ref 28–170)
Saturation Ratios: 29 % (ref 10.4–31.8)
TIBC: 526 ug/dL — ABNORMAL HIGH (ref 250–450)
UIBC: 374 ug/dL (ref 148–442)

## 2022-11-03 NOTE — Progress Notes (Signed)
HEMATOLOGY/ONCOLOGY CLINIC NOTE  Date of Service: 11/03/22     Patient Care Team: Storm Frisk, MD as PCP - General (Pulmonary Disease) Leane Call, RN as Case Manager Shaune Leeks as Social Worker  CHIEF COMPLAINTS/PURPOSE OF CONSULTATION:  For continued evaluation and management of iron deficiency anemia  HISTORY OF PRESENTING ILLNESS:   Please see previous note for details on initial presentation  INTERVAL HISTORY  Leah Baker is a wonderful 42 y.o. female who is here for continued valuation management of iron deficiency anemia.   Patient was last seen by me on 08/20/2021 and she was doing well overall.   Patient notes she has been fairly well since our last visit. She complains of bleeding due to hemorrhoid and severe back pain. She notes that her hemorrhoid are bleeding everyday.   She is currently taking Aygestin 5 mg, which was controlling her menstrual cycle for a while, but her heavy menstrual cycle has started back. Patient continues to follow-up with her OB/GYN and her next visit with her OB/GYN is in July.   Patient notes she had hemorrhoid banding since our last visit, which helped her hemorrhoid for a while.   She denies fever, chills, infection issues, unexpected weight loss, night sweats, abdominal pain, back pain, chest pain, or leg swelling.   Patient regularly takes iron polysaccharide supplement.    MEDICAL HISTORY:  Past Medical History:  Diagnosis Date   Abnormal uterine bleeding (AUB)    Anemia    [employer]reesia 06/08/2020   Anxiety    Blood in stool last 2 years   Depression    Depression    Phreesia 06/08/2020   Hemorrhoids    History of blood transfusion 05/2019 2 units   iron transfusion given also   History of lower GI bleeding dec 2021 active problem   s/p  colonoscopy 09-01-2018,  hemorrhoids   IDA (iron deficiency anemia)    Inguinal lymphadenopathy    CT 09-14-2018  right side, followed by pcp   PONV  (postoperative nausea and vomiting)    only happened once   Wears glasses    Wears glasses     SURGICAL HISTORY: Past Surgical History:  Procedure Laterality Date   BIOPSY  09/01/2018   Procedure: BIOPSY;  Surgeon: Kathi Der, MD;  Location: [facility];  Service: Gastroenterology;;   CESAREAN SECTION  01/15/2002   @[facility]    CESAREAN SECTION  03/17/2011   Procedure: CESAREAN SECTION;  Surgeon: Tereso Newcomer, MD;  Location: [facility];  Service: Gynecology;  Laterality: N/A;   COLONOSCOPY WITH PROPOFOL N/A 09/01/2018   Procedure: COLONOSCOPY WITH PROPOFOL;  Surgeon: Kathi Der, MD;  Location: [facility];  Service: Gastroenterology;  Laterality: N/A;   DILITATION & CURRETTAGE/HYSTROSCOPY WITH HYDROTHERMAL ABLATION N/A 08/22/2020   Procedure: DILATATION & CURETTAGE/HYSTEROSCOPY CERVICAL REPAIR;  Surgeon: Reva Bores, MD;  Location: [facility];  Service: Gynecology;  Laterality: N/A;   HYSTEROSCOPY WITH D & C  06-21-2009  dr constant @[facility]    w/  REMOVAL IUD    INGUINAL LYMPH NODE BIOPSY Right 04/12/2020   Procedure: RIGHT INGUINAL LYMPH NODE EXCISIONAL BIOPSY;  Surgeon: Almond Lint, MD;  Location: [facility];  Service: General;  Laterality: Right;   IUD REMOVAL  06/21/2009   KNEE ARTHROSCOPY WITH ANTERIOR CRUCIATE LIGAMENT (ACL) REPAIR Left 09-08-2013   dr Eulah Pont  @[facility]    w/  meniscal repair   MULTIPLE TOOTH EXTRACTIONS  2012   RECTAL EXAM UNDER ANESTHESIA N/A 11/18/2018  Procedure: ANORECTAL EXAM UNDER ANESTHESIA;  Surgeon: Andria Meuse, MD;  Location: [facility];  Service: General;  Laterality: N/A;   TUBAL LIGATION Bilateral 2012    SOCIAL HISTORY: Social History   Socioeconomic History   Marital status: Significant Other    Spouse name: Not on file   Number of children: Not on file   Years of education: Not on file   Highest education level: Not on file  Occupational History   Not on file  Tobacco Use   Smoking status: Former     Years: 5.00    Types: Cigarettes    Quit date: 08/22/2008    Years since quitting: 13.0   Smokeless tobacco: Never  Vaping Use   Vaping Use: Never used  Substance and Sexual Activity   Alcohol use: Not Currently   Drug use: Not Currently    Comment: per pt younger in college marijuana, none since   Sexual activity: Yes    Birth control/protection: Surgical  Other Topics Concern   Not on file  Social History Narrative   Not on file   Social Determinants of Health   Financial Resource Strain: Low Risk    Difficulty of Paying Living Expenses: Not hard at all  Food Insecurity: No Food Insecurity   Worried About Programme researcher, broadcasting/film/video in the Last Year: Never true   Ran Out of Food in the Last Year: Never true  Transportation Needs: No Transportation Needs   Lack of Transportation (Medical): No   Lack of Transportation (Non-Medical): No  Physical Activity: Insufficiently Active   Days of Exercise per Week: 2 days   Minutes of Exercise per Session: 20 min  Stress: Stress Concern Present   Feeling of Stress : Very much  Social Connections: Moderately Integrated   Frequency of Communication with Friends and Family: More than three times a week   Frequency of Social Gatherings with Friends and Family: More than three times a week   Attends Religious Services: More than 4 times per year   Active Member of [employer] or Organizations: No   Attends Banker Meetings: Never   Marital Status: Living with partner  Intimate Partner Violence: Not on file    FAMILY HISTORY: Family History  Problem Relation Age of Onset   Cancer Mother    Kidney failure Mother    Healthy Father     ALLERGIES:  is allergic to latex.  MEDICATIONS:  Current Outpatient Medications  Medication Sig Dispense Refill   acetaminophen (TYLENOL) 500 MG tablet Take 1 tablet (500 mg total) by mouth every 6 (six) hours as needed. 30 tablet 0   Ferrous Sulfate (IRON) 325 (65 Fe) MG TABS Take by mouth in the  morning and at bedtime.     fluticasone (FLONASE) 50 MCG/ACT nasal spray Place 2 sprays into both nostrils daily. 16 g 6   hydrocortisone (ANUSOL-HC) 2.5 % rectal cream Place 1 application rectally 2 (two) times daily. 30 g 0   Menthol-Camphor (TIGER BALM ARTHRITIS RUB EX) Apply 1 application topically daily as needed (knee pain).     norethindrone (AYGESTIN) 5 MG tablet Take 3 tablets (15 mg total) by mouth daily. 90 tablet 3   sertraline (ZOLOFT) 100 MG tablet Take 1 tablet (100 mg total) by mouth at bedtime. 60 tablet 3   No current facility-administered medications for this visit.   Facility-Administered Medications Ordered in Other Visits  Medication Dose Route Frequency Provider Last Rate Last Admin  bupivacaine liposome (EXPAREL) 1.3 % injection 266 mg  20 mL Infiltration Once Almond Lint, MD        REVIEW OF SYSTEMS:   10 Point review of Systems was done is negative except as noted above.   PHYSICAL EXAMINATION: ECOG PERFORMANCE STATUS: 2 - Symptomatic, <50% confined to bed  . Vitals:   08/20/21 0918  BP: (!) 148/98  Pulse: 86  Resp: 20  Temp: 97.9 F (36.6 C)  SpO2: 100%    Filed Weights   08/20/21 0918  Weight: 169 lb 3.2 oz (76.7 kg)    .Body mass index is 30.95 kg/m. NAD GENERAL:alert, in no acute distress and comfortable SKIN: no acute rashes, no significant lesions EYES: conjunctiva are pink and non-injected, sclera anicteric OROPHARYNX: MMM, no exudates, no oropharyngeal erythema or ulceration NECK: supple, no JVD LYMPH:  no palpable lymphadenopathy in the cervical, axillary or inguinal regions LUNGS: clear to auscultation b/l with normal respiratory effort HEART: regular rate & rhythm ABDOMEN:  normoactive bowel sounds , non tender, not distended. Extremity: no pedal edema PSYCH: alert & oriented x 3 with fluent speech NEURO: no focal motor/sensory deficits  LABORATORY DATA:  I have reviewed the data as listed  .    Latest Ref Rng & Units  11/03/2022    9:16 AM 07/07/2022    9:25 AM 02/19/2022    8:59 AM  CBC  WBC 4.0 - 10.5 K/uL 6.4  7.0  6.8   Hemoglobin 12.0 - 15.0 g/dL 16.1  09.6  04.5   Hematocrit 36.0 - 46.0 % 39.1  41.0  36.7   Platelets 150 - 400 K/uL 423  477  412     .    Latest Ref Rng & Units 11/03/2022    9:16 AM 07/07/2022    9:25 AM 02/19/2022    8:59 AM  CMP  Glucose 70 - 99 mg/dL 409  90  97   BUN 6 - 20 mg/dL 16  14  12    Creatinine 0.44 - 1.00 mg/dL 8.11  9.14  7.82   Sodium 135 - 145 mmol/L 136  140  136   Potassium 3.5 - 5.1 mmol/L 3.7  4.1  3.9   Chloride 98 - 111 mmol/L 108  104  108   CO2 22 - 32 mmol/L 19  17  22    Calcium 8.9 - 10.3 mg/dL 9.5  9.8  8.7   Total Protein 6.5 - 8.1 g/dL 7.8  7.6  7.0   Total Bilirubin 0.3 - 1.2 mg/dL 0.4  0.2  0.3   Alkaline Phos 38 - 126 U/L 39  46  37   AST 15 - 41 U/L 17  27  26    ALT 0 - 44 U/L 14  22  27     Lab Results  Component Value Date   IRON 152 11/03/2022   TIBC 526 (H) 11/03/2022   IRONPCTSAT 29 11/03/2022   (Iron and TIBC)  Lab Results  Component Value Date   FERRITIN 151 11/03/2022    09/29/2018 Lymph node biopsy    04/12/2020 Surgical Pathology Lymph Node Biopsy  A. LYMPH NODE, RIGHT INGUINAL, EXCISION:  -Florid lymphoid hyperplasia  -See comment  COMMENT:  The sections show preservation of the lymph nodal architecture.  The  lymphoid tissue displays a large number of variably sized lymphoid  follicles with reactive appearing germinal centers.  Scattered lymphoid  nodules with features of progressive transformation of germinal centers  are seen.  The interfollicular  zones are variably expanded by small  lymphocytes, and variable numbers of large centroblastic lymphoid appearing cells in addition to eosinophils in some areas.  No granulomata or metastatic malignancy is identified.  To further evaluate this process, flow cytometric analysis was performed [facility] [id number]) and  failed to show any monoclonal B-cell population or abnormal T-cell  phenotype.  In addition, a battery of immunohistochemical stains were  performed including BCL-2, BCL6, CD10, CD20, CD21, CD23, PAX 5, cyclin  D1, CD3, CD5, CD30, CD15, EMA in addition to EBV in situ hybridization  with appropriate controls.  There is a mixture of T and B cells in their  respective compartments.  B-cell markers highlight the numerous lymphoid  follicles throughout which also stain positively for CD10, and BCL6 and  negatively for BCL-2.  CD21 and CD23 highlight the abundant follicular  dendritic networks within the lymphoid follicles.  The lymphoid  follicles are surrounded by abundant T cells as seen with CD3 and CD5.  No significant cyclin D1 or EBV positivity identified.  CD30 highlights  numerous large lymphoid cells within the interfollicular zones and  germinal centers but with no significant CD15 or EMA positivity  identified.  The overall features are not considered specific but  consistent with reactive lymphoid hyperplasia.  There is no definite or diagnostic evidence of a lymphoproliferative process.  Clinical correlation is recommended.   RADIOGRAPHIC STUDIES: I have personally reviewed the radiological images as listed and agreed with the findings in the report. No results found.  ASSESSMENT & PLAN:   42 yo with   1) Rt inguiinal and iliac lymphadenopathy -- reactive lymphoid hyperplasia 2) Patient reported possible Hemochromatosis ?carrier state 3) Iron deficiency anemia likely from hemorrhoidal bleeding 4) Thrombocytosis - likely reactive from iron deficiency.  PLAN: -Discussed lab results from today, 11/03/2022, with the patient. CBC shows elevated Platelets at 423 K. CMP is stable. Iron saturation in the normal range at 29%. Ferritin level is 151 -No indication for additional IV iron replacement at this time. -Continue p.o. iron polysaccharide 150 mg p.o. daily -Continue to follow-up with OB/GYN for continued management of menorrhagia.   -Continue follow-up with  gastroenterology Dr. Levora Angel for management of GI bleeding. RTC with Dr Candise Che with labs in 6 months  FOLLOW UP: RTC with Dr Candise Che with labs in 6 months  The total time spent in the appointment was 20 minutes* .  All of the patient's questions were answered with apparent satisfaction. The patient knows to call the clinic with any problems, questions or concerns.   Wyvonnia Lora MD MS AAHIVMS [facility] Hematology/Oncology Physician [facility]  .*Total Encounter Time as defined by the Centers for Medicare and Medicaid Services includes, in addition to the face-to-face time of a patient visit (documented in the note above) non-face-to-face time: obtaining and reviewing outside history, ordering and reviewing medications, tests or procedures, care coordination (communications with other health care professionals or caregivers) and documentation in the medical record.   I, Ok Edwards, am acting as a Neurosurgeon for Wyvonnia Lora, MD.  .I have reviewed the above documentation for accuracy and completeness, and I agree with the above. Johney Maine MD

## 2022-11-03 NOTE — Telephone Encounter (Signed)
Patient called in this morning expressing is is having really bad cramping and bleeding.  She is scheduled to see ONCOLOGY  this morning but she want a nurse to call her back about her cramping and bleeding

## 2022-11-04 NOTE — Telephone Encounter (Signed)
Ordering urgent GYN appt for this patient with severe vaginal bleeding and pelvic pain, pt of Dr Shawnie Pons , any provider at [facility] is acceptable, call pt with appt  If noone will see the patient then add her on Thursday,  call the patietn

## 2022-11-04 NOTE — Telephone Encounter (Signed)
Per chart review was last seen in office by Dr. Shawnie Pons 11/19/21 and had video visit with Dr. Shawnie Pons 04/10/22 re: bleeding .  I called Wyndi and left message I am returning her call and if she still has issue please call us back or send MyChart message. Nancy Fetter

## 2022-11-05 ENCOUNTER — Telehealth: Payer: Self-pay | Admitting: Hematology

## 2022-11-10 ENCOUNTER — Encounter: Payer: Self-pay | Admitting: Hematology

## 2022-11-10 ENCOUNTER — Telehealth: Payer: Self-pay | Admitting: Family Medicine

## 2022-11-10 NOTE — Telephone Encounter (Signed)
Called and left pt a message to call back to get scheduled

## 2022-11-17 ENCOUNTER — Telehealth: Payer: Medicaid Other | Admitting: Physician Assistant

## 2022-11-17 DIAGNOSIS — J069 Acute upper respiratory infection, unspecified: Secondary | ICD-10-CM | POA: Diagnosis not present

## 2022-11-17 DIAGNOSIS — H6691 Otitis media, unspecified, right ear: Secondary | ICD-10-CM | POA: Diagnosis not present

## 2022-11-17 MED ORDER — PREDNISONE 20 MG PO TABS: 40.0000 mg | ORAL_TABLET | Freq: Every day | ORAL | 0 refills | Status: DC

## 2022-11-17 MED ORDER — AMOXICILLIN-POT CLAVULANATE 875-125 MG PO TABS: 1.0000 | ORAL_TABLET | Freq: Two times a day (BID) | ORAL | 0 refills | Status: DC

## 2022-11-17 NOTE — Patient Instructions (Addendum)
Debroah Loop, thank you for joining Piedad Climes, PA-C for today's virtual visit.  While this provider is not your primary care provider (PCP), if your PCP is located in our provider database this encounter information will be shared with them immediately following your visit.   A [facility] MyChart account gives you access to today's visit and all your visits, tests, and labs performed at [facility] " click here if you don't have a [facility] MyChart account or go to [email or web address]  Consent: (Patient) Leah Baker provided verbal consent for this virtual visit at the beginning of the encounter.  Current Medications:  Current Outpatient Medications:    acetaminophen (TYLENOL) 500 MG tablet, Take 1 tablet (500 mg total) by mouth every 6 (six) hours as needed., Disp: 30 tablet, Rfl: 0   amLODipine (NORVASC) 10 MG tablet, Take 1 tablet (10 mg total) by mouth daily., Disp: 90 tablet, Rfl: 2   amoxicillin-clavulanate (AUGMENTIN) 875-125 MG tablet, Take 1 tablet by mouth 2 (two) times daily. (Patient not taking: Reported on 10/09/2022), Disp: 20 tablet, Rfl: 0   aspirin-sod bicarb-citric acid (ALKA-SELTZER) 325 MG TBEF tablet, Take 325 mg by mouth every 6 (six) hours as needed. (Patient not taking: Reported on 08/05/2022), Disp: , Rfl:    benzonatate (TESSALON) 200 MG capsule, Take 1 capsule (200 mg total) by mouth 2 (two) times daily as needed for cough. (Patient not taking: Reported on 08/05/2022), Disp: 40 capsule, Rfl: 0   Blood Pressure Monitoring (BLOOD PRESSURE KIT) DEVI, Use to measure blood pressure, Disp: 1 each, Rfl: 0   busPIRone (BUSPAR) 7.5 MG tablet, Take 1 tablet (7.5 mg total) by mouth 3 (three) times daily., Disp: 270 tablet, Rfl: 2   cetirizine (ZYRTEC ALLERGY) 10 MG tablet, Take 1 tablet (10 mg total) by mouth daily., Disp: 30 tablet, Rfl: 11   diclofenac Sodium (VOLTAREN) 1 % GEL, Apply 2 g topically 4 (four) times daily., Disp: 50 g, Rfl: 2    Ferrous Sulfate (IRON) 325 (65 Fe) MG TABS, Take by mouth in the morning and at bedtime., Disp: , Rfl:    fluticasone (FLONASE) 50 MCG/ACT nasal spray, Place 2 sprays into both nostrils daily., Disp: 16 g, Rfl: 6   hydrocortisone (ANUSOL-HC) 2.5 % rectal cream, Place 1 application. rectally 2 (two) times daily., Disp: 30 g, Rfl: 2   Menthol-Camphor (TIGER BALM ARTHRITIS RUB EX), Apply 1 application topically daily as needed (knee pain)., Disp: , Rfl:    norethindrone (AYGESTIN) 5 MG tablet, Take 2 tablets (10 mg total) by mouth daily., Disp: 90 tablet, Rfl: 3   oxybutynin (DITROPAN) 5 MG tablet, Take 1 tablet (5 mg total) by mouth 3 (three) times daily. (Patient not taking: Reported on 10/09/2022), Disp: 60 tablet, Rfl: 1   promethazine-dextromethorphan (PROMETHAZINE-DM) 6.25-15 MG/5ML syrup, Take 5 mLs by mouth 4 (four) times daily as needed for cough. (Patient not taking: Reported on 07/04/2022), Disp: 118 mL, Rfl: 0   psyllium (METAMUCIL) 58.6 % packet, Take 1 packet by mouth daily., Disp: , Rfl:    sertraline (ZOLOFT) 100 MG tablet, Take 1 tablet (100 mg total) by mouth at bedtime., Disp: 60 tablet, Rfl: 3   Vitamin D, Ergocalciferol, (DRISDOL) 1.25 MG (50000 UNIT) CAPS capsule, Take 1 capsule (50,000 Units total) by mouth every 7 (seven) days., Disp: 16 capsule, Rfl: 0 No current facility-administered medications for this visit.  Facility-Administered Medications Ordered in Other Visits:    bupivacaine liposome (EXPAREL) 1.3 % injection 266 mg,  20 mL, Infiltration, Once, Almond Lint, MD   Medications ordered in this encounter:  No orders of the defined types were placed in this encounter.    *If you need refills on other medications prior to your next appointment, please contact your pharmacy*  Follow-Up: Call back or seek an in-person evaluation if the symptoms worsen or if the condition fails to improve as anticipated.  [facility] [phone]  Other  Instructions Please keep well-hydrated.  Try to rest. Ok to restart Sudafed and Flonase. Ok to continue Tylenol. Start Mucinex OTC. Take prednisone and antibiotic as directed. If symptoms are not resolving, or you note new or worsening symptoms despite treatment, please seek an in-person evaluation ASAP.   If you have been instructed to have an in-person evaluation today at a local Urgent Care facility, please use the link below. It will take you to a list of all of our available [facility], including address, phone number and hours of operation. Please do not delay care.  [facility]  If you or a family member do not have a primary care provider, use the link below to schedule a visit and establish care. When you choose a [facility] primary care physician or advanced practice provider, you gain a long-term partner in health. Find a Primary Care Provider  Learn more about [facility] in-office and virtual care options: [place] - Get Care Now

## 2022-11-17 NOTE — Progress Notes (Signed)
Virtual Visit Consent   Leah Baker, you are scheduled for a virtual visit with a [facility] provider today. Just as with appointments in the office, your consent must be obtained to participate. Your consent will be active for this visit and any virtual visit you may have with one of our providers in the next 365 days. If you have a MyChart account, a copy of this consent can be sent to you electronically.  As this is a virtual visit, video technology does not allow for your provider to perform a traditional examination. This may limit your provider's ability to fully assess your condition. If your provider identifies any concerns that need to be evaluated in person or the need to arrange testing (such as labs, EKG, etc.), we will make arrangements to do so. Although advances in technology are sophisticated, we cannot ensure that it will always work on either your end or our end. If the connection with a video visit is poor, the visit may have to be switched to a telephone visit. With either a video or telephone visit, we are not always able to ensure that we have a secure connection.  By engaging in this virtual visit, you consent to the provision of healthcare and authorize for your insurance to be billed (if applicable) for the services provided during this visit. Depending on your insurance coverage, you may receive a charge related to this service.  I need to obtain your verbal consent now. Are you willing to proceed with your visit today? Leah Baker has provided verbal consent on 11/17/2022 for a virtual visit (video or telephone). Piedad Climes, [place]  Date: 11/17/2022 3:04 PM  Virtual Visit via Video Note   I, Piedad Climes, connected with  Leah Baker  ([id number], 04/15/1981) on 11/17/22 at  3:00 PM EDT by a video-enabled telemedicine application and verified that I am speaking with the correct person using two identifiers.  Location: Patient: Virtual Visit Location  Patient: Home Provider: Virtual Visit Location Provider: Home Office   I discussed the limitations of evaluation and management by telemedicine and the availability of in person appointments. The patient expressed understanding and agreed to proceed.    History of Present Illness: Leah Baker is a 42 y.o. who identifies as a female who was assigned female at birth, and is being seen today for URI symptoms starting overnight. Notes felt feverish, checked temperature and noted temperature at 102. Took some Tylenol for the fever. With this has noted L ear pain, pressure and clogged sensation.  Also noting aches, chills and fatigue. Has substantial history of ear ache/infection, even as an adult, most recently in March. Now with some nasal and head congestion with cough but denies sinus pain. Denies recent travel or sick contact.  Just took a home COVID test which was negative.   HPI: HPI  Problems:  Patient Active Problem List   Diagnosis Date Noted   Recurrent acute suppurative otitis media without spontaneous rupture of tympanic membrane of both sides 08/19/2022   Primary hypertension 12/10/2021   Medication side effect 10/03/2021   Dysuria 10/03/2021   Hereditary hemochromatosis (HCC) 09/17/2021   Alopecia 09/17/2021   Dental caries 09/17/2021   Carrier of hemochromatosis HFE gene mutation 08/07/2020   Lymphoid hyperplasia 07/30/2020   Anxiety 06/22/2020   Menorrhagia 11/27/2018   Rectal bleeding 11/26/2018   Liver lesion 09/09/2018   Hemorrhoid 11/29/2014   Iron deficiency anemia 05/04/2014   Mood disorder (HCC) 02/20/2014  Tears of meniscus and ACL of left knee 09/08/2013   Chronic pain of left knee 05/19/2013   Depression 05/14/2011   H/O: cesarean section 08/28/2010    Allergies:  Allergies  Allergen Reactions   Latex Itching and Rash   Medications:  Current Outpatient Medications:    amoxicillin-clavulanate (AUGMENTIN) 875-125 MG tablet, Take 1 tablet by mouth 2  (two) times daily., Disp: 10 tablet, Rfl: 0   predniSONE (DELTASONE) 20 MG tablet, Take 2 tablets (40 mg total) by mouth daily with breakfast., Disp: 10 tablet, Rfl: 0   acetaminophen (TYLENOL) 500 MG tablet, Take 1 tablet (500 mg total) by mouth every 6 (six) hours as needed., Disp: 30 tablet, Rfl: 0   amLODipine (NORVASC) 10 MG tablet, Take 1 tablet (10 mg total) by mouth daily., Disp: 90 tablet, Rfl: 2   Blood Pressure Monitoring (BLOOD PRESSURE KIT) DEVI, Use to measure blood pressure, Disp: 1 each, Rfl: 0   busPIRone (BUSPAR) 7.5 MG tablet, Take 1 tablet (7.5 mg total) by mouth 3 (three) times daily., Disp: 270 tablet, Rfl: 2   cetirizine (ZYRTEC ALLERGY) 10 MG tablet, Take 1 tablet (10 mg total) by mouth daily., Disp: 30 tablet, Rfl: 11   diclofenac Sodium (VOLTAREN) 1 % GEL, Apply 2 g topically 4 (four) times daily., Disp: 50 g, Rfl: 2   Ferrous Sulfate (IRON) 325 (65 Fe) MG TABS, Take by mouth in the morning and at bedtime., Disp: , Rfl:    fluticasone (FLONASE) 50 MCG/ACT nasal spray, Place 2 sprays into both nostrils daily., Disp: 16 g, Rfl: 6   hydrocortisone (ANUSOL-HC) 2.5 % rectal cream, Place 1 application. rectally 2 (two) times daily., Disp: 30 g, Rfl: 2   Menthol-Camphor (TIGER BALM ARTHRITIS RUB EX), Apply 1 application topically daily as needed (knee pain)., Disp: , Rfl:    norethindrone (AYGESTIN) 5 MG tablet, Take 2 tablets (10 mg total) by mouth daily., Disp: 90 tablet, Rfl: 3   psyllium (METAMUCIL) 58.6 % packet, Take 1 packet by mouth daily., Disp: , Rfl:    sertraline (ZOLOFT) 100 MG tablet, Take 1 tablet (100 mg total) by mouth at bedtime., Disp: 60 tablet, Rfl: 3   Vitamin D, Ergocalciferol, (DRISDOL) 1.25 MG (50000 UNIT) CAPS capsule, Take 1 capsule (50,000 Units total) by mouth every 7 (seven) days., Disp: 16 capsule, Rfl: 0 No current facility-administered medications for this visit.  Facility-Administered Medications Ordered in Other Visits:    bupivacaine liposome  (EXPAREL) 1.3 % injection 266 mg, 20 mL, Infiltration, Once, Almond Lint, MD  Observations/Objective: Patient is well-developed, well-nourished in no acute distress.  Resting comfortably at home.  Head is normocephalic, atraumatic.  No labored breathing. Speech is clear and coherent with logical content.  Patient is alert and oriented at baseline.   Assessment and Plan: 1. Upper respiratory tract infection, unspecified type - predniSONE (DELTASONE) 20 MG tablet; Take 2 tablets (40 mg total) by mouth daily with breakfast.  Dispense: 10 tablet; Refill: 0  2. Right otitis media, unspecified otitis media type - amoxicillin-clavulanate (AUGMENTIN) 875-125 MG tablet; Take 1 tablet by mouth 2 (two) times daily.  Dispense: 10 tablet; Refill: 0 - predniSONE (DELTASONE) 20 MG tablet; Take 2 tablets (40 mg total) by mouth daily with breakfast.  Dispense: 10 tablet; Refill: 0  Supportive measures and OTC medications reviewed. Resume Flonase. Will add on burst of prednisone for inflammation, especially since she cannot take NSAIDs. Augmentin to cover for AOM as she had substantial history of this and substantial  otalgia at present.   Follow Up Instructions: I discussed the assessment and treatment plan with the patient. The patient was provided an opportunity to ask questions and all were answered. The patient agreed with the plan and demonstrated an understanding of the instructions.  A copy of instructions were sent to the patient via MyChart unless otherwise noted below.   The patient was advised to call back or seek an in-person evaluation if the symptoms worsen or if the condition fails to improve as anticipated.  Time:  I spent 10 minutes with the patient via telehealth technology discussing the above problems/concerns.    Piedad Climes, PA-C

## 2022-12-09 ENCOUNTER — Other Ambulatory Visit: Payer: Medicaid Other | Admitting: *Deleted

## 2022-12-09 NOTE — Patient Outreach (Signed)
  Medicaid Managed Care   Unsuccessful Attempt Note   12/09/2022 Name: Leah Baker MRN: [id number] [name]: [birth year]  Referred by: Storm Frisk, MD Reason for referral : High Risk Managed Medicaid (Unsuccessful RNCM follow up telephone outreach)   An unsuccessful telephone outreach was attempted today. The patient was referred to the case management team for assistance with care management and care coordination.    Follow Up Plan: A HIPAA compliant phone message was left for the patient providing contact information and requesting a return call. and The Managed Medicaid care management team will reach out to the patient again over the next 7 days.    Estanislado Emms RN, BSN [place]  Managed [facility] RN Care Coordinator [phone]

## 2022-12-09 NOTE — Patient Instructions (Signed)
Visit Information  [name]  - as a part of your Medicaid benefit, you are eligible for care management and care coordination services at no cost or copay. I was unable to reach you by phone today but would be happy to help you with your health related needs. Please feel free to call me @ [phone].   A member of the Managed Medicaid care management team will reach out to you again over the next 7 days.   Estanislado Emms RN, BSN [place]  Managed [facility] RN Care Coordinator [phone]

## 2022-12-22 ENCOUNTER — Other Ambulatory Visit: Payer: Self-pay | Admitting: Gastroenterology

## 2022-12-22 DIAGNOSIS — R590 Localized enlarged lymph nodes: Secondary | ICD-10-CM | POA: Diagnosis not present

## 2022-12-22 DIAGNOSIS — Z148 Genetic carrier of other disease: Secondary | ICD-10-CM | POA: Diagnosis not present

## 2022-12-22 DIAGNOSIS — K769 Liver disease, unspecified: Secondary | ICD-10-CM

## 2022-12-22 DIAGNOSIS — K625 Hemorrhage of anus and rectum: Secondary | ICD-10-CM | POA: Diagnosis not present

## 2022-12-22 DIAGNOSIS — K649 Unspecified hemorrhoids: Secondary | ICD-10-CM | POA: Diagnosis not present

## 2022-12-23 ENCOUNTER — Other Ambulatory Visit: Payer: Medicaid Other | Admitting: *Deleted

## 2022-12-23 ENCOUNTER — Encounter: Payer: Self-pay | Admitting: *Deleted

## 2022-12-23 NOTE — Patient Instructions (Signed)
Visit Information  [name] was given information about Medicaid Managed Care team care coordination services as a part of their [facility] Medicaid benefit. Leah Baker verbally consented to engagement with the [facility] Managed Care team.   If you are experiencing a medical emergency, please call 911 or report to your local emergency department or urgent care.   If you have a non-emergency medical problem during routine business hours, please contact your provider's office and ask to speak with a nurse.   For questions related to your [facility], please call: [phone] or visit the homepage here: [email or web address]  If you would like to schedule transportation through your [facility], please call the following number at least 2 days in advance of your appointment: [phone]   Rides for urgent appointments can also be made after hours by calling Member Services.  Call the Behavioral Health Crisis Line at [phone], at any time, 24 hours a day, 7 days a week. If you are in danger or need immediate medical attention call 911.  If you would like help to quit smoking, call 1-800-QUIT-NOW (318-416-3673) OR Espaol: 1-855-Djelo-Ya (3-500-938-1829) o para ms informacin haga clic aqu or Text READY to 937-169 to register via text  Leah Baker,   Please see education materials related to anxiety and Vitamin D Deficiency provided by MyChart link.  Patient verbalizes understanding of instructions and care plan provided today and agrees to view in MyChart. Active MyChart status and patient understanding of how to access instructions and care plan via MyChart confirmed with patient.     Telephone follow up appointment with Managed Medicaid care management team member scheduled for:02/23/23 @ 9am  Estanislado Emms RN, BSN [place]  Managed [facility] RN Care  Coordinator [phone]   Following is a copy of your plan of care:  Care Plan : RN Care Manager Plan of Care  Updates made by [name], RN since 12/23/2022 12:00 AM     Problem: Chronic Disease Management and Care Coordination Needs for Iron Deficiency Anemia, Internal hemorrhoid, Anxiety & Depression   Priority: High     Long-Range Goal: Development of Plan of Care for Chronic Disease Management and Care Coordination Needs (Iron Deficiency Anemia & Bleeding due to internal hemorroid; Anxiety & Depression)   Start Date: 07/24/2021  Expected End Date: 02/23/2023  Priority: High  Note:   Current Barriers:  Knowledge Deficits related to plan of care for management of Anxiety with Excessive Worry, Social Anxiety, and Depression: anxiety loss of energy/fatigue disturbed sleep decreased appetite and Iron Deficiency Anemia & Bleeding due to internal hemorrhoid  Care Coordination needs related to Lacks knowledge of community resource: Dental & Vision resources  Chronic Disease Management support and education needs related to Anxiety with Excessive Worry, Social Anxiety,, Depression: anxiety loss of energy/fatigue disturbed sleep decreased appetite, and Iron Deficiency Anemia  and Bleeding due to internal hemorroid  RNCM Clinical Goal(s):  RNCM will help patient verbalize understanding of plan for management of Anxiety, Depression, and Iron Deficiency Anemia & bleeding due to internal hemorrhoid as evidenced by providing education regarding these chronic diseases. verbalize basic understanding of Anxiety, Depression, and Iron Deficiency Anemia & bleeding due to internal hemorrhoid disease process and self health management plan as evidenced by improved management of these chronic diseases take all medications exactly as prescribed and will call provider for medication related questions as evidenced by medication compliance    attend all scheduled medical  appointments:  01/20/23 with  GYN and Colonoscopy in August ; as evidenced by attending all scheduled appointments        demonstrate ongoing adherence to prescribed treatment plan for Anxiety, Depression, and Iron Deficiency Anemia & bleeding due to internal hemorrhoid as evidenced by patient being compliant with treatment plan. continue to work with Medical illustrator and/or Social Worker to address care management and care coordination needs related to Anxiety, Depression, and Iron Deficiency Anemia & Bleeding due to internal hemorrhoid  as evidenced by adherence to CM Team Scheduled appointments     work with Child psychotherapist to address [employer] knowledge of community resource: Dental and Vision resources related to the management of Anxiety, Depression, and Iron Deficiency Anemia & bleeding due to internal hemorrhoid as evidenced by review of EMR and patient or Child psychotherapist report     through collaboration with Medical illustrator, provider, and care team.   Interventions: Inter-disciplinary care team collaboration (see longitudinal plan of care) Evaluation of current treatment plan related to  self management and patient's adherence to plan as established by provider   Anxiety & Depression  (Status:  Goal on track:  Yes.)  Long Term Goal Evaluation of current treatment plan related to Anxiety and Depression, Lacks knowledge of community resource: Dental & Vision resources,  self-management and patient's adherence to plan as established by provider. Discussed plans with patient for ongoing care management follow up and provided patient with direct contact information for care management team Evaluation of current treatment plan related to Anxiety & Depression and patient's adherence to plan as established by provider Reviewed medications with patient and discussed use of antidepressant Provided patient with MyChart educational materials related to stress management Reviewed scheduled/upcoming provider appointments including   11/03/22  with Oncology Provieded therapeutic listening Collaborated with [name] per patient request to continue meeting with her for [facility] needs 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 Deficiency ,  self-management and patient's adherence to plan as established by provider. Discussed plans with patient for ongoing care management follow up and provided patient with direct contact information for care management team Evaluation of current treatment plan related to Vitamin D Deficiency and patient's adherence to plan as established by provider Advised patient to spend 10-15 minutes outside every day, take a walk, sit by an open window Provided education to patient re: Vitamin D rich foods Reviewed medications with patient and discussed completion of vitamin D, advised to discuss with PCP plan of care  Assessed social determinant of health barriers Advised patient to follow up with PCP for reevaluation   Patient Goals/Self-Care Activities: Take medications as prescribed   Attend all scheduled provider appointments Call pharmacy for medication refills 3-7 days in advance of running out of medications Call provider office for new concerns or questions  Work with the social worker to address care coordination needs and will continue to work with the clinical team to address health care and disease management related needs

## 2022-12-23 NOTE — Patient Outreach (Signed)
Medicaid Managed Care   Nurse Care Manager Note  12/23/2022 Name:  Leah Baker MRN:  [id number] [name]:  [birth year]  Leah Baker is an 42 y.o. year old female who is a primary patient of Storm Frisk, MD.  The [facility] Managed Care Coordination team was consulted for assistance with:    Anxiety Depression Vitamin D Deficiency  Ms. Campi was given information about Medicaid Managed Care Coordination team services today. Leah Baker Patient agreed to services and verbal consent obtained.  Engaged with patient by telephone for follow up visit in response to provider referral for case management and/or care coordination services.   Assessments/Interventions:  Review of past medical history, allergies, medications, health status, including review of consultants reports, laboratory and other test data, was performed as part of comprehensive evaluation and provision of chronic care management services.  SDOH (Social Determinants of Health) assessments and interventions performed: SDOH Interventions    Flowsheet Row Patient Outreach Telephone from 12/23/2022 in [place] POPULATION HEALTH DEPARTMENT Patient Outreach Telephone from 10/16/2022 in [place] POPULATION HEALTH DEPARTMENT Patient Outreach Telephone from 10/09/2022 in [place] POPULATION HEALTH DEPARTMENT Patient Outreach Telephone from 08/19/2022 in [place] POPULATION HEALTH DEPARTMENT Patient Outreach Telephone from 08/05/2022 in [place] POPULATION HEALTH DEPARTMENT Patient Outreach Telephone from 07/04/2022 in [place] POPULATION HEALTH DEPARTMENT  SDOH Interventions        Food Insecurity Interventions Intervention Not Indicated -- Intervention Not Indicated -- Intervention Not Indicated --  Housing Interventions Intervention Not Indicated -- -- -- -- Intervention Not Indicated  Transportation Interventions Intervention Not Indicated -- Intervention Not Indicated -- -- Intervention Not Indicated  Utilities  Interventions Intervention Not Indicated -- -- -- -- Intervention Not Indicated  Stress Interventions -- [employer], Provide Counseling  [Reports interest in going back to old therapist. Resource provided. Patient denies needing a follow up call but was appreciative] -- [employer], Provide Counseling -- --       Care Plan  Allergies  Allergen Reactions   Latex Itching and Rash    Medications Reviewed Today     Reviewed by [name], RN (Registered Nurse) on 12/23/22 at [phone]  Med List Status: <None>   Medication Order Taking? Sig Documenting Provider Last Dose Status Informant  acetaminophen (TYLENOL) 500 MG tablet [id number] Yes Take 1 tablet (500 mg total) by mouth every 6 (six) hours as needed. Merrilee Jansky, MD Taking Active Self  amLODipine (NORVASC) 10 MG tablet [id number] Yes Take 1 tablet (10 mg total) by mouth daily. Reva Bores, MD Taking Active   amoxicillin-clavulanate (AUGMENTIN) 875-125 MG tablet [id number] No Take 1 tablet by mouth 2 (two) times daily.  Patient not taking: Reported on 12/23/2022   Noel Journey Not Taking Active   Blood Pressure Monitoring (BLOOD PRESSURE KIT) DEVI [id number] Yes Use to measure blood pressure Storm Frisk, MD Taking Active   busPIRone (BUSPAR) 7.5 MG tablet [id number] Yes Take 1 tablet (7.5 mg total) by mouth 3 (three) times daily. Reva Bores, MD Taking Active   cetirizine [facility] ALLERGY) 10 MG tablet [id number] Yes Take 1 tablet (10 mg total) by mouth daily. Mayers, Cari S, PA-C Taking Active   diclofenac Sodium (VOLTAREN) 1 % GEL [id number] Yes Apply 2 g topically 4 (four) times daily. Storm Frisk, MD Taking Active   Ferrous Sulfate (IRON) 325 (65 Fe) MG TABS [id number] Yes Take by mouth in the morning and at bedtime.  [provider] Taking Active Self           Med Note (Brynlie Daza A   Fri Feb 21, 2022  9:20 AM)    fluticasone (FLONASE) 50  MCG/ACT nasal spray [id number] Yes Place 2 sprays into both nostrils daily. Storm Frisk, MD Taking Active   hydrocortisone (ANUSOL-HC) 2.5 % rectal cream [id number] Yes Place 1 application. rectally 2 (two) times daily. Storm Frisk, MD Taking Active   Menthol-Camphor (TIGER [facility] ARTHRITIS RUB EX) [id number] Yes Apply 1 application topically daily as needed (knee pain). [provider] Taking Active Self  norethindrone (AYGESTIN) 5 MG tablet [id number] Yes Take 2 tablets (10 mg total) by mouth daily. Reva Bores, MD Taking Active   predniSONE (DELTASONE) 20 MG tablet [id number] No Take 2 tablets (40 mg total) by mouth daily with breakfast.  Patient not taking: Reported on 12/23/2022   Waldon Merl, PA-C Not Taking Active   psyllium (METAMUCIL) 58.6 % packet [id number] Yes Take 1 packet by mouth daily. [provider] Taking Active   sertraline (ZOLOFT) 100 MG tablet [id number] Yes Take 1 tablet (100 mg total) by mouth at bedtime. Storm Frisk, MD Taking Active   Vitamin D, Ergocalciferol, (DRISDOL) 1.25 MG (50000 UNIT) CAPS capsule [id number] No Take 1 capsule (50,000 Units total) by mouth every 7 (seven) days.  Patient not taking: Reported on 12/23/2022   Bary Richard Not Taking Active             Patient Active Problem List   Diagnosis Date Noted   Recurrent acute suppurative otitis media without spontaneous rupture of tympanic membrane of both sides 08/19/2022   Primary hypertension 12/10/2021   Medication side effect 10/03/2021   Dysuria 10/03/2021   Hereditary hemochromatosis (HCC) 09/17/2021   Alopecia 09/17/2021   Dental caries 09/17/2021   Carrier of hemochromatosis HFE gene mutation 08/07/2020   Lymphoid hyperplasia 07/30/2020   Anxiety 06/22/2020   Menorrhagia 11/27/2018   Rectal bleeding 11/26/2018   Liver lesion 09/09/2018   Hemorrhoid 11/29/2014   Iron deficiency anemia 05/04/2014   Mood disorder (HCC) 02/20/2014   Tears of  meniscus and ACL of left knee 09/08/2013   Chronic pain of left knee 05/19/2013   Depression 05/14/2011   H/O: cesarean section 08/28/2010    Conditions to be addressed/monitored per PCP order:  Anxiety, Depression, and Vitamin D Deficiency  Care Plan : RN Care Manager Plan of Care  Updates made by [name], RN since 12/23/2022 12:00 AM     Problem: Chronic Disease Management and Care Coordination Needs for Iron Deficiency Anemia, Internal hemorrhoid, Anxiety & Depression   Priority: High     Long-Range Goal: Development of Plan of Care for Chronic Disease Management and Care Coordination Needs (Iron Deficiency Anemia & Bleeding due to internal hemorroid; Anxiety & Depression)   Start Date: 07/24/2021  Expected End Date: 02/23/2023  Priority: High  Note:   Current Barriers:  Knowledge Deficits related to plan of care for management of Anxiety with Excessive Worry, Social Anxiety, and Depression: anxiety loss of energy/fatigue disturbed sleep decreased appetite and Iron Deficiency Anemia & Bleeding due to internal hemorrhoid  Care Coordination needs related to Lacks knowledge of community resource: Dental & Vision resources  Chronic Disease Management support and education needs related to Anxiety with Excessive Worry, Social Anxiety,, Depression: anxiety loss of energy/fatigue disturbed sleep decreased appetite, and Iron Deficiency Anemia  and Bleeding due to internal hemorroid  RNCM  Clinical Goal(s):  RNCM will help patient verbalize understanding of plan for management of Anxiety, Depression, and Iron Deficiency Anemia & bleeding due to internal hemorrhoid as evidenced by providing education regarding these chronic diseases. verbalize basic understanding of Anxiety, Depression, and Iron Deficiency Anemia & bleeding due to internal hemorrhoid disease process and self health management plan as evidenced by improved management of these chronic diseases take all medications  exactly as prescribed and will call provider for medication related questions as evidenced by medication compliance    attend all scheduled medical appointments:  01/20/23 with GYN and Colonoscopy in August ; as evidenced by attending all scheduled appointments        demonstrate ongoing adherence to prescribed treatment plan for Anxiety, Depression, and Iron Deficiency Anemia & bleeding due to internal hemorrhoid as evidenced by patient being compliant with treatment plan. continue to work with Medical illustrator and/or Social Worker to address care management and care coordination needs related to Anxiety, Depression, and Iron Deficiency Anemia & Bleeding due to internal hemorrhoid  as evidenced by adherence to CM Team Scheduled appointments     work with Child psychotherapist to address [employer] knowledge of community resource: Dental and Vision resources related to the management of Anxiety, Depression, and Iron Deficiency Anemia & bleeding due to internal hemorrhoid as evidenced by review of EMR and patient or Child psychotherapist report     through collaboration with Medical illustrator, provider, and care team.   Interventions: Inter-disciplinary care team collaboration (see longitudinal plan of care) Evaluation of current treatment plan related to  self management and patient's adherence to plan as established by provider   Anxiety & Depression  (Status:  Goal on track:  Yes.)  Long Term Goal Evaluation of current treatment plan related to Anxiety and Depression, Lacks knowledge of community resource: Dental & Vision resources,  self-management and patient's adherence to plan as established by provider. Discussed plans with patient for ongoing care management follow up and provided patient with direct contact information for care management team Evaluation of current treatment plan related to Anxiety & Depression and patient's adherence to plan as established by provider Reviewed medications with patient and discussed  use of antidepressant Provided patient with MyChart educational materials related to stress management Reviewed scheduled/upcoming provider appointments including   11/03/22 with Oncology Provieded therapeutic listening Collaborated with [name] per patient request to continue meeting with her for [facility] needs 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 Deficiency ,  self-management and patient's adherence to plan as established by provider. Discussed plans with patient for ongoing care management follow up and provided patient with direct contact information for care management team Evaluation of current treatment plan related to Vitamin D Deficiency and patient's adherence to plan as established by provider Advised patient to spend 10-15 minutes outside every day, take a walk, sit by an open window Provided education to patient re: Vitamin D rich foods Reviewed medications with patient and discussed completion of vitamin D, advised to discuss with PCP plan of care  Assessed social determinant of health barriers Advised patient to follow up with PCP for reevaluation   Patient Goals/Self-Care Activities: Take medications as prescribed   Attend all scheduled provider appointments Call pharmacy for medication refills 3-7  days in advance of running out of medications Call provider office for new concerns or questions  Work with the social worker to address care coordination needs and will continue to work with the clinical team to address health care and disease management related needs       Follow Up:  Patient agrees to Care Plan and Follow-up.  Plan: The Managed Medicaid care management team will reach out to the patient again over the next 60 days.  Date/time  of next scheduled RN care management/care coordination outreach:  02/23/23 @ 9am  Estanislado Emms RN, BSN [place]  Managed Medicaid RN Care Coordinator [phone]

## 2023-01-14 ENCOUNTER — Ambulatory Visit: Payer: Self-pay

## 2023-01-14 DIAGNOSIS — K625 Hemorrhage of anus and rectum: Secondary | ICD-10-CM | POA: Diagnosis not present

## 2023-01-14 NOTE — Telephone Encounter (Signed)
Patient scheduled an appointment with provider for 01/15/2023.

## 2023-01-14 NOTE — Telephone Encounter (Signed)
Summary: bp high/headache   Pt states that she has been out of her medication for a week and a half and she is having a headache, bp 167/108 and 157/107. amLODipine (NORVASC) 10 MG tablet [id number] [place], [place] - [address] [facility] [address] Phone: [phone] Fax: [phone]         Chief Complaint: Out of amlodipine 1-2 weeks, last refilled by Dr. Shawnie Pons. Asking Dr. Delford Field to refill today. Has mild headache, Tylenol helped.  Symptoms: Above Frequency: Today. Had colonoscopy today and BP high. Pertinent Negatives: Patient denies any other symptoms. Disposition: [] ED /[] Urgent Care (no appt availability in office) / [] Appointment(In office/virtual)/ []  [place] [facility]/ [] Home Care/ [] Refused Recommended Disposition /[] [place] [facility]/ [x]  Follow-up with PCP Additional Notes: Please advise pt.  Reason for Disposition  [1] Systolic BP  >= 180 OR Diastolic >= 110 AND [2] missed most recent dose of blood pressure medication  Answer Assessment - Initial Assessment Questions 1. BLOOD PRESSURE: "What is the blood pressure?" "Did you take at least two measurements 5 minutes apart?"     167/108  157/107 2. ONSET: "When did you take your blood pressure?"     Today 3. HOW: "How did you take your blood pressure?" (e.g., automatic home BP monitor, visiting nurse)     Nurse 4. HISTORY: "Do you have a history of high blood pressure?"     Yes 5. MEDICINES: "Are you taking any medicines for blood pressure?" "Have you missed any doses recently?"     Yes 6. OTHER SYMPTOMS: "Do you have any symptoms?" (e.g., blurred vision, chest pain, difficulty breathing, headache, weakness)     Headache 7. PREGNANCY: "Is there any chance you are pregnant?" "When was your last menstrual period?"     No  Protocols used: Blood Pressure - High-A-AH

## 2023-01-15 ENCOUNTER — Ambulatory Visit: Payer: Medicaid Other | Admitting: Critical Care Medicine

## 2023-01-20 ENCOUNTER — Other Ambulatory Visit: Payer: Self-pay

## 2023-01-20 ENCOUNTER — Ambulatory Visit (INDEPENDENT_AMBULATORY_CARE_PROVIDER_SITE_OTHER): Payer: Medicaid Other | Admitting: Family Medicine

## 2023-01-20 ENCOUNTER — Encounter: Payer: Self-pay | Admitting: Family Medicine

## 2023-01-20 DIAGNOSIS — N921 Excessive and frequent menstruation with irregular cycle: Secondary | ICD-10-CM | POA: Diagnosis not present

## 2023-01-20 MED ORDER — NORETHINDRONE ACETATE 5 MG PO TABS: 15.0000 mg | ORAL_TABLET | Freq: Every day | ORAL | 3 refills | Status: AC

## 2023-01-20 NOTE — Assessment & Plan Note (Addendum)
Discussed all options again, including increasing her Aygestin, IUD placement, hysterectomy. Risks of all reviewed. She still prefers to not have major surgery at this time. She is working on trying to get her hemorrhoids and rectal bleeding corrected. Will increase her Aygestin back to 15 mg daily.

## 2023-01-20 NOTE — Progress Notes (Signed)
   Subjective:    Patient ID: Leah Baker is a 42 y.o. female presenting with vasomotor symptoms  on 01/20/2023  HPI: Has been on aygestin and we decreased it from 15 mg to 10 mg. Attempted to decrease to 5 mg and had bleeding. She was essentially amenorrheic with the 15 mg dose. She reports breakthrough bleeding, headaches, and cramping. She is not bleeding so heavy but bleeding enough to cause her to have a flow. She is not anemic at this time, but has been in the past. She takes ibuprofen as needed. Has had some nausea with her cycle. Having rectal bleeding.  Has tried and failed an IUD which attached to her uterine wall. We attempted endometrial ablation, but had a uterine perforation.  Review of Systems  Constitutional:  Negative for chills and fever.  Respiratory:  Negative for shortness of breath.   Cardiovascular:  Negative for chest pain.  Gastrointestinal:  Negative for abdominal pain, nausea and vomiting.  Genitourinary:  Negative for dysuria.  Skin:  Negative for rash.      Objective:    BP (!) 134/91   Pulse 84   Ht 5\' 4"  (1.626 m)   Wt 167 lb (75.8 kg)   LMP  (LMP Unknown)   BMI 28.67 kg/m  Physical Exam Exam conducted with a chaperone present.  Constitutional:      General: She is not in acute distress.    Appearance: She is well-developed.  HENT:     Head: Normocephalic and atraumatic.  Eyes:     General: No scleral icterus. Cardiovascular:     Rate and Rhythm: Normal rate.  Pulmonary:     Effort: Pulmonary effort is normal.  Abdominal:     Palpations: Abdomen is soft.  Musculoskeletal:     Cervical back: Neck supple.  Skin:    General: Skin is warm and dry.  Neurological:     Mental Status: She is alert and oriented to person, place, and time.         Assessment & Plan:   Problem List Items Addressed This Visit       Unprioritized   Menorrhagia    Discussed all options again, including increasing her Aygestin, IUD placement,  hysterectomy. Risks of all reviewed. She still prefers to not have major surgery at this time. She is working on trying to get her hemorrhoids and rectal bleeding corrected. Will increase her Aygestin back to 15 mg daily.       Relevant Medications   norethindrone (AYGESTIN) 5 MG tablet    Return in about 2 months (around 03/22/2023).  Reva Bores, MD 01/20/2023 9:17 AM

## 2023-01-28 ENCOUNTER — Encounter: Payer: Self-pay | Admitting: Physician Assistant

## 2023-01-28 ENCOUNTER — Ambulatory Visit: Payer: Medicaid Other | Attending: Critical Care Medicine | Admitting: Physician Assistant

## 2023-01-28 VITALS — BP 133/86 | HR 81 | Wt 164.8 lb

## 2023-01-28 DIAGNOSIS — E559 Vitamin D deficiency, unspecified: Secondary | ICD-10-CM | POA: Diagnosis not present

## 2023-01-28 DIAGNOSIS — I10 Essential (primary) hypertension: Secondary | ICD-10-CM | POA: Diagnosis not present

## 2023-01-28 DIAGNOSIS — Z8489 Family history of other specified conditions: Secondary | ICD-10-CM

## 2023-01-28 DIAGNOSIS — F329 Major depressive disorder, single episode, unspecified: Secondary | ICD-10-CM | POA: Diagnosis not present

## 2023-01-28 DIAGNOSIS — F430 Acute stress reaction: Secondary | ICD-10-CM | POA: Diagnosis not present

## 2023-01-28 DIAGNOSIS — K648 Other hemorrhoids: Secondary | ICD-10-CM | POA: Diagnosis not present

## 2023-01-28 DIAGNOSIS — R5383 Other fatigue: Secondary | ICD-10-CM

## 2023-01-28 DIAGNOSIS — F419 Anxiety disorder, unspecified: Secondary | ICD-10-CM | POA: Diagnosis not present

## 2023-01-28 DIAGNOSIS — F411 Generalized anxiety disorder: Secondary | ICD-10-CM | POA: Diagnosis not present

## 2023-01-28 MED ORDER — SERTRALINE HCL 100 MG PO TABS: 100.0000 mg | ORAL_TABLET | Freq: Every day | ORAL | 3 refills | Status: AC

## 2023-01-28 MED ORDER — PROCTOFOAM HC 1-1 % EX FOAM: Freq: Three times a day (TID) | CUTANEOUS | 3 refills | Status: DC

## 2023-01-28 MED ORDER — BUSPIRONE HCL 7.5 MG PO TABS: 7.5000 mg | ORAL_TABLET | Freq: Three times a day (TID) | ORAL | 2 refills | Status: DC

## 2023-01-28 MED ORDER — AMLODIPINE BESYLATE 10 MG PO TABS: 10.0000 mg | ORAL_TABLET | Freq: Every day | ORAL | 2 refills | Status: DC

## 2023-01-28 NOTE — Patient Instructions (Signed)
Hemorrhoids Hemorrhoids are swollen veins that may form: In the butt (rectum). These are called internal hemorrhoids. Around the opening of the butt (anus). These are called external hemorrhoids. Most hemorrhoids do not cause very bad problems. They often get better with changes to your lifestyle and what you eat. What are the causes? Having trouble pooping (constipation) or watery poop (diarrhea). Pushing too hard when you poop. Pregnancy. Being very overweight (obese). Sitting for too long. Riding a bike for a long time. Heavy lifting or other things that take a lot of effort. Anal sex. What are the signs or symptoms? Pain. Itching or soreness in the butt. Bleeding from the butt. Leaking poop. Swelling. One or more lumps around the opening of your butt. How is this treated? In most cases, hemorrhoids can be treated at home. You may be told to: Change what you eat. Make changes to your lifestyle. If these treatments do not help, you may need to have a procedure done. Your doctor may need to: Place rubber bands at the bottom of the hemorrhoids to make them fall off. Put medicine into the hemorrhoids to shrink them. Shine a type of light on the hemorrhoids to cause them to fall off. Do surgery to get rid of the hemorrhoids. Follow these instructions at home: Medicines Take over-the-counter and prescription medicines only as told by your doctor. Use creams with medicine in them or medicines that you put in your butt as told by your doctor. Eating and drinking  Eat foods that have a lot of fiber in them. These include whole grains, beans, nuts, fruits, and vegetables. Ask your doctor about taking products that have fiber added to them (fibersupplements). Take in less fat. You can do this by: Eating low-fat dairy products. Eating less red meat. Staying away from processed foods. Drink enough fluid to keep your pee (urine) pale yellow. Managing pain and swelling  Take a  warm-water bath (sitz bath) for 20 minutes to ease pain. Do this 3-4 times a day. You may do this in a bathtub. You may also use a portable sitz bath that fits over the toilet. If told, put ice on the painful area. It may help to use ice between your warm baths. Put ice in a plastic bag. Place a towel between your skin and the bag. Leave the ice on for 20 minutes, 2-3 times a day. If your skin turns bright red, take off the ice right away to prevent skin damage. The risk of damage is higher if you cannot feel pain, heat, or cold. General instructions Exercise. Ask your doctor how much and what kind of exercise is best for you. Go to the bathroom when you need to poop. Do not wait. Try not to push too hard when you poop. Keep your butt dry and clean. Use wet toilet paper or moist towelettes after you poop. Do not sit on the toilet for a long time. Contact a doctor if: You have pain and swelling that do not get better with treatment. You have trouble pooping. You cannot poop. You have pain or swelling outside the area of the hemorrhoids. Get help right away if: You have bleeding from the butt that will not stop. This information is not intended to replace advice given to you by your health care provider. Make sure you discuss any questions you have with your health care provider. Document Revised: 01/29/2022 Document Reviewed: 01/29/2022 Elsevier Patient Education  2024 [employer]  

## 2023-01-28 NOTE — Progress Notes (Signed)
Patient ID: Leah Baker, female   [name]: June 13, 1980, 42 y.o.   MRN: [id number]   Leah Baker, is a 42 y.o. female  ONG:[id number]  GMW:[id number]  [name] - 11/24/1980  Chief Complaint  Patient presents with   Hypertension   Medication Refill       Subjective:   Leah Baker is a 42 y.o. female here today for several things.  Needs med RF.  Resumed BP meds about 2 weeks ago.    Stable on anxiety and depression meds.  No SI/HI.    Dad was found to have a pituitary tumor that is benign and she wants to get checked.  No galactorrhea.  Some HA that improved with BP meds.    Had colonoscopy last week and having painful hemorrhoids.  GI is in same building as [place] and she is going down after this appointment to have them check the area since procedure was just done.    Needs vitamin D rechecked-was 8 in February.    No problems updated.  ALLERGIES: Allergies  Allergen Reactions   Latex Itching and Rash    PAST MEDICAL HISTORY: Past Medical History:  Diagnosis Date   Abnormal uterine bleeding (AUB)    Anemia    Phreesia 06/08/2020   Anxiety    Blood in stool last 2 years   Depression    Depression    Phreesia 06/08/2020   Hemorrhoids    History of blood transfusion 05/2019 2 units   iron transfusion given also   History of lower GI bleeding dec 2021 active problem   s/p  colonoscopy 09-01-2018,  hemorrhoids   IDA (iron deficiency anemia)    Inguinal lymphadenopathy    CT 09-14-2018  right side, followed by pcp   PONV (postoperative nausea and vomiting)    only happened once   Wears glasses    Wears glasses     MEDICATIONS AT HOME: Prior to Admission medications   Medication Sig Start Date End Date Taking? Authorizing Provider  hydrocortisone-pramoxine (PROCTOFOAM HC) rectal foam Place rectally 3 (three) times daily. 01/28/23  Yes Anders Simmonds, PA-C  acetaminophen (TYLENOL) 500 MG tablet Take 1 tablet (500 mg total) by mouth every 6 (six) hours as needed.  11/11/19   Merrilee Jansky, MD  amLODipine (NORVASC) 10 MG tablet Take 1 tablet (10 mg total) by mouth daily. 01/28/23   Anders Simmonds, PA-C  busPIRone (BUSPAR) 7.5 MG tablet Take 1 tablet (7.5 mg total) by mouth 3 (three) times daily. 01/28/23   Anders Simmonds, PA-C  cetirizine (ZYRTEC ALLERGY) 10 MG tablet Take 1 tablet (10 mg total) by mouth daily. 06/10/22   Mayers, Cari S, PA-C  diclofenac Sodium (VOLTAREN) 1 % GEL Apply 2 g topically 4 (four) times daily. 01/22/22   Storm Frisk, MD  Ferrous Sulfate (IRON) 325 (65 Fe) MG TABS Take by mouth in the morning and at bedtime.    [provider]  fluticasone (FLONASE) 50 MCG/ACT nasal spray Place 2 sprays into both nostrils daily. 09/17/21   Storm Frisk, MD  hydrocortisone (ANUSOL-HC) 2.5 % rectal cream Place 1 application. rectally 2 (two) times daily. 09/17/21   Storm Frisk, MD  Menthol-Camphor (TIGER BALM ARTHRITIS RUB EX) Apply 1 application topically daily as needed (knee pain).    [provider]  norethindrone (AYGESTIN) 5 MG tablet Take 3 tablets (15 mg total) by mouth daily. 01/20/23   Reva Bores, MD  psyllium (METAMUCIL) 58.6 % packet  Take 1 packet by mouth daily.    [provider]  sertraline (ZOLOFT) 100 MG tablet Take 1 tablet (100 mg total) by mouth at bedtime. 01/28/23   Anders Simmonds, PA-C  Vitamin D, Ergocalciferol, (DRISDOL) 1.25 MG (50000 UNIT) CAPS capsule Take 1 capsule (50,000 Units total) by mouth every 7 (seven) days. 07/09/22   Claire Bridge, Marzella Schlein, PA-C    ROS: Neg HEENT Neg resp Neg cardiac Neg GI Neg GU Neg MS Neg psych Neg neuro  Objective:   Vitals:   01/28/23 0842  BP: 133/86  Pulse: 81  SpO2: 100%  Weight: 164 lb 12.8 oz (74.8 kg)   Exam General appearance : Awake, alert, not in any distress. Speech Clear. Not toxic looking HEENT: Atraumatic and Normocephalic Neck: Supple, no JVD. No cervical lymphadenopathy.  Chest: Good air entry bilaterally, CTAB.   No rales/rhonchi/wheezing CVS: S1 S2 regular, no murmurs.  Extremities: B/L Lower Ext shows no edema, both legs are warm to touch Neurology: Awake alert, and oriented X 3, CN II-XII intact, Non focal Skin: No Rash  Data Review Lab Results  Component Value Date   HGBA1C 5.5 07/07/2022    Assessment & Plan   1. Primary hypertension Controlled but did not take meds this morning.  Reviewed labs from 11/2022 - amLODipine (NORVASC) 10 MG tablet; Take 1 tablet (10 mg total) by mouth daily.  Dispense: 90 tablet; Refill: 2  2. Reactive depression stable - sertraline (ZOLOFT) 100 MG tablet; Take 1 tablet (100 mg total) by mouth at bedtime.  Dispense: 60 tablet; Refill: 3  3. Anxiety as acute reaction to exceptional stress stable - sertraline (ZOLOFT) 100 MG tablet; Take 1 tablet (100 mg total) by mouth at bedtime.  Dispense: 60 tablet; Refill: 3  4. Anxiety stable - busPIRone (BUSPAR) 7.5 MG tablet; Take 1 tablet (7.5 mg total) by mouth 3 (three) times daily.  Dispense: 270 tablet; Refill: 2  5. Vitamin D deficiency - Vitamin D, 25-hydroxy  6. Family history of benign neoplasm of pituitary gland and craniopharyngeal duct - Prolactin  7. Other hemorrhoids  - hydrocortisone-pramoxine (PROCTOFOAM HC) rectal foam; Place rectally 3 (three) times daily.  Dispense: 10 g; Refill: 3 - Ambulatory referral to General Surgery  8. Other fatigue - TSH    Return in about 6 months (around 07/31/2023) for assign PCP since Delford Field will be retired.  The patient was given clear instructions to go to ER or return to medical center if symptoms don't improve, worsen or new problems develop. The patient verbalized understanding. The patient was told to call to get lab results if they haven't heard anything in the next week.      Georgian Co, PA-C [facility] and [facility] [place], [place] [phone]   01/28/2023, 9:02 AM

## 2023-01-29 ENCOUNTER — Other Ambulatory Visit: Payer: Self-pay | Admitting: Physician Assistant

## 2023-01-29 DIAGNOSIS — E559 Vitamin D deficiency, unspecified: Secondary | ICD-10-CM

## 2023-01-29 LAB — VITAMIN D 25 HYDROXY (VIT D DEFICIENCY, FRACTURES): Vit D, 25-Hydroxy: 19.8 ng/mL — ABNORMAL LOW (ref 30.0–100.0)

## 2023-01-29 LAB — TSH: TSH: 0.727 u[IU]/mL (ref 0.450–4.500)

## 2023-01-29 LAB — PROLACTIN: Prolactin: 7.4 ng/mL (ref 4.8–33.4)

## 2023-01-29 MED ORDER — VITAMIN D (ERGOCALCIFEROL) 1.25 MG (50000 UNIT) PO CAPS: 50000.0000 [IU] | ORAL_CAPSULE | ORAL | 0 refills | Status: DC

## 2023-01-30 ENCOUNTER — Telehealth: Payer: Self-pay

## 2023-01-30 NOTE — Telephone Encounter (Signed)
 Called patient following up on results and  has seen them in [place]

## 2023-01-30 NOTE — Telephone Encounter (Signed)
-----   Message from Georgian Co sent at 01/29/2023  3:46 PM EDT ----- Your vitamin D is still low but improved since February.  This can contribute to muscle aches, anxiety, fatigue, and depression.  I have sent a prescription to the pharmacy for you to take once a week.  We will recheck this level in 3-4 months.   Thyroid is normal and prolactin levels are normal which make a tumor of the pituitary gland unlikely.  Thanks, Georgian Co, PA-C

## 2023-02-03 ENCOUNTER — Telehealth: Payer: Self-pay

## 2023-02-03 DIAGNOSIS — K625 Hemorrhage of anus and rectum: Secondary | ICD-10-CM

## 2023-02-03 DIAGNOSIS — K649 Unspecified hemorrhoids: Secondary | ICD-10-CM

## 2023-02-03 NOTE — Telephone Encounter (Signed)
Copied from CRM [id number]. Topic: General - Other >> Feb 03, 2023  1:39 PM Everette C wrote: Reason for CRM: Cat with [facility] has called to share the patient's insurance is out of network and their referral for the patient's hemorrhoid concerns would require out of pocket coverage   The patient has been notified of this as well    Please contact Cat at [phone] if needed

## 2023-02-09 NOTE — Telephone Encounter (Signed)
Ref updated

## 2023-02-09 NOTE — Addendum Note (Signed)
Addended by: Storm Frisk on: 02/09/2023 04:48 AM   Modules accepted: Orders

## 2023-02-10 NOTE — Telephone Encounter (Signed)
Noted thank you

## 2023-02-12 ENCOUNTER — Other Ambulatory Visit: Payer: Medicaid Other

## 2023-02-23 ENCOUNTER — Other Ambulatory Visit: Payer: Medicaid Other | Admitting: *Deleted

## 2023-02-23 NOTE — Patient Outreach (Signed)
Care Coordination  02/23/2023  CHARNEA VALDIVIEZO 1980/07/20 [id number]   Ms. Altemus emailed LCSW and shared that she needs to reschedule the telephone outreach with [facility] on 02/23/23. RNCM rescheduled this appointment to 02/24/23 at 11:15am.   Estanislado Emms RN, BSN [place]  [facility] Health RN Care Coordinator [phone]

## 2023-02-24 ENCOUNTER — Other Ambulatory Visit: Payer: Medicaid Other | Admitting: *Deleted

## 2023-02-24 NOTE — Patient Instructions (Signed)
Visit Information  Ms. Debroah Loop  - as a part of your Medicaid benefit, you are eligible for care management and care coordination services at no cost or copay. I was unable to reach you by phone today but would be happy to help you with your health related needs. Please feel free to call me @ [phone].   A member of the Managed Medicaid care management team will reach out to you again over the next 7 days.   Estanislado Emms RN, BSN [place]  [facility] RN Care Coordinator [phone]

## 2023-02-24 NOTE — Patient Outreach (Signed)
Medicaid Managed Care   Unsuccessful Attempt Note   02/24/2023 Name: Leah Baker MRN: [id number] [name]: 1981-05-17  Referred by: Storm Frisk, MD Reason for referral : High Risk Managed Medicaid (Unsuccessful RNCM follow up outreach)   An unsuccessful telephone outreach was attempted today. The patient was referred to the case management team for assistance with care management and care coordination.    Follow Up Plan: A HIPAA compliant phone message was left for the patient providing contact information and requesting a return call. and The Managed Medicaid care management team will reach out to the patient again over the next 7 days.    Estanislado Emms RN, BSN [place]  [facility] RN Care Coordinator [phone]

## 2023-03-09 ENCOUNTER — Encounter: Payer: Self-pay | Admitting: Hematology

## 2023-03-10 ENCOUNTER — Telehealth: Payer: Self-pay

## 2023-03-10 NOTE — Telephone Encounter (Signed)
..   Medicaid Managed Care   Unsuccessful Outreach Note  03/10/2023 Name: Leah Baker MRN: [id number] [name]: 1981/03/20  Referred by: Storm Frisk, MD Reason for referral : Appointment (I called the patient today to get her missed phone appointment rescheduled with the [facility] RNCM.I left my name and number on her VM.)   A second unsuccessful telephone outreach was attempted today. The patient was referred to the case management team for assistance with care management and care coordination.   Follow Up Plan: A HIPAA compliant phone message was left for the patient providing contact information and requesting a return call.  The care management team will reach out to the patient again over the next 7 days.   Weston Settle Care Guide  [facility] Managed  Care Guide [facility]  [phone]

## 2023-03-15 ENCOUNTER — Ambulatory Visit
Admission: RE | Admit: 2023-03-15 | Discharge: 2023-03-15 | Disposition: A | Discharge status: Home / Self Care | Payer: Medicaid Other | Source: Ambulatory Visit | Attending: Gastroenterology | Admitting: Gastroenterology

## 2023-03-15 DIAGNOSIS — K769 Liver disease, unspecified: Secondary | ICD-10-CM | POA: Diagnosis not present

## 2023-03-15 MED ORDER — GADOPICLENOL 0.5 MMOL/ML IV SOLN
7.0000 mL | Freq: Once | INTRAVENOUS | Status: AC | PRN
  Administered 2023-03-15: 7 mL via INTRAVENOUS

## 2023-03-25 ENCOUNTER — Other Ambulatory Visit: Payer: Self-pay | Admitting: *Deleted

## 2023-03-25 NOTE — Patient Outreach (Signed)
  Medicaid Managed Care   Unsuccessful Attempt Note   03/25/2023 Name: Leah Baker MRN: [id number] [name]: 1980-09-16  Referred by: Storm Frisk, MD Reason for referral : High Risk Managed Medicaid (Unsuccessful RNCM follow up telephone outreach)   Third unsuccessful telephone outreach was attempted today. The patient was referred to the case management team for assistance with care management and care coordination. The patient's primary care provider has been notified of our unsuccessful attempts to make or maintain contact with the patient. The care management team is pleased to engage with this patient at any time in the future should he/she be interested in assistance from the care management team.    Follow Up Plan: The Managed Medicaid care management team is available to follow up with the patient after provider conversation with the patient regarding recommendation for care management engagement and subsequent re-referral to the care management team.     Estanislado Emms RN, BSN [place]  [facility] RN Care Coordinator [phone]

## 2023-03-29 ENCOUNTER — Other Ambulatory Visit: Payer: Self-pay

## 2023-03-29 ENCOUNTER — Encounter (HOSPITAL_COMMUNITY): Payer: Self-pay | Admitting: Pharmacy Technician

## 2023-03-29 ENCOUNTER — Emergency Department (HOSPITAL_COMMUNITY): Payer: Medicaid Other

## 2023-03-29 ENCOUNTER — Emergency Department (HOSPITAL_COMMUNITY)
Admission: EM | Admit: 2023-03-29 | Discharge: 2023-03-29 | Disposition: A | Discharge status: Home / Self Care | Payer: Medicaid Other | Attending: Emergency Medicine | Admitting: Emergency Medicine

## 2023-03-29 DIAGNOSIS — Z91040 Latex allergy status: Secondary | ICD-10-CM | POA: Diagnosis not present

## 2023-03-29 DIAGNOSIS — R0789 Other chest pain: Secondary | ICD-10-CM | POA: Diagnosis not present

## 2023-03-29 LAB — COMPREHENSIVE METABOLIC PANEL
ALT: 23 U/L (ref 0–44)
AST: 24 U/L (ref 15–41)
Albumin: 4.2 g/dL (ref 3.5–5.0)
Alkaline Phosphatase: 37 U/L — ABNORMAL LOW (ref 38–126)
Anion gap: 12 (ref 5–15)
BUN: 13 mg/dL (ref 6–20)
CO2: 17 mmol/L — ABNORMAL LOW (ref 22–32)
Calcium: 8.9 mg/dL (ref 8.9–10.3)
Chloride: 107 mmol/L (ref 98–111)
Creatinine, Ser: 0.84 mg/dL (ref 0.44–1.00)
GFR, Estimated: >60 mL/min (ref >60)
Glucose, Bld: 95 mg/dL (ref 70–99)
Potassium: 3.9 mmol/L (ref 3.5–5.1)
Sodium: 136 mmol/L (ref 135–145)
Total Bilirubin: 0.2 mg/dL — ABNORMAL LOW (ref 0.3–1.2)
Total Protein: 7.3 g/dL (ref 6.5–8.1)

## 2023-03-29 LAB — CBC
HCT: 37.6 % (ref 36.0–46.0)
Hemoglobin: 11.8 g/dL — ABNORMAL LOW (ref 12.0–15.0)
MCH: 29.1 pg (ref 26.0–34.0)
MCHC: 31.4 g/dL (ref 30.0–36.0)
MCV: 92.6 fL (ref 80.0–100.0)
Platelets: 457 10*3/uL — ABNORMAL HIGH (ref 150–400)
RBC: 4.06 MIL/uL (ref 3.87–5.11)
RDW: 13.7 % (ref 11.5–15.5)
WBC: 5.3 10*3/uL (ref 4.0–10.5)
nRBC: 0 % (ref 0.0–0.2)

## 2023-03-29 LAB — HCG, SERUM, QUALITATIVE: Preg, Serum: NEGATIVE

## 2023-03-29 LAB — TROPONIN I (HIGH SENSITIVITY): Troponin I (High Sensitivity): 4 ng/L (ref <18)

## 2023-03-29 LAB — D-DIMER, QUANTITATIVE: D-Dimer, Quant: <0.27 ug{FEU}/mL (ref 0.00–0.50)

## 2023-03-29 LAB — LIPASE, BLOOD: Lipase: 47 U/L (ref 11–51)

## 2023-03-29 MED ORDER — IBUPROFEN 400 MG PO TABS
600.0000 mg | ORAL_TABLET | Freq: Once | ORAL | Status: AC
  Administered 2023-03-29: 600 mg via ORAL
  Filled 2023-03-29: qty 1

## 2023-03-29 NOTE — ED Triage Notes (Signed)
Pt here with reports of tightness in her chest onset yesterday. Today developed shortness of breath along with sharp chest pain. Hx anxiety, states this feels different. Also endorses an uneasy feeling in her stomach.

## 2023-03-29 NOTE — ED Provider Notes (Signed)
[place] EMERGENCY DEPARTMENT AT [facility] Provider Note   CSN: [id number] Arrival date & time: 03/29/23  8295     History  Chief Complaint  Patient presents with   Chest Pain   Shortness of Breath    Leah Baker is a 42 y.o. female.  42 year old female with prior medical history as detailed below presents for evaluation.  Patient reports diffuse anterior chest wall pain.  Symptoms been ongoing for at least 2 to 3 days.  Patient reports recent heavy lifting that could have strained her chest wall muscles.  She denies fever or cough.  She reports some intermittent shortness of breath along with her chest discomfort.  She denies prior history of cardiac disease.  The history is provided by the patient and medical records.       Home Medications Prior to Admission medications   Medication Sig Start Date End Date Taking? Authorizing Provider  acetaminophen (TYLENOL) 500 MG tablet Take 1 tablet (500 mg total) by mouth every 6 (six) hours as needed. Patient taking differently: Take 1,000 mg by mouth every 6 (six) hours as needed for mild pain (pain score 1-3) or headache. 11/11/19  Yes Lamptey, Britta Mccreedy, MD  amLODipine (NORVASC) 10 MG tablet Take 1 tablet (10 mg total) by mouth daily. 01/28/23  Yes McClung, Marzella Schlein, PA-C  busPIRone (BUSPAR) 7.5 MG tablet Take 1 tablet (7.5 mg total) by mouth 3 (three) times daily. 01/28/23  Yes Anders Simmonds, PA-C  cetirizine (ZYRTEC ALLERGY) 10 MG tablet Take 1 tablet (10 mg total) by mouth daily. Patient taking differently: Take 10 mg by mouth daily as needed for allergies. 06/10/22  Yes Mayers, Cari S, PA-C  diclofenac Sodium (VOLTAREN) 1 % GEL Apply 2 g topically 4 (four) times daily. Patient taking differently: Apply 2 g topically 4 (four) times daily as needed (pain). 01/22/22  Yes Storm Frisk, MD  Ferrous Sulfate (IRON) 325 (65 Fe) MG TABS Take by mouth in the morning and at bedtime.   Yes [provider]   fluticasone (FLONASE) 50 MCG/ACT nasal spray Place 2 sprays into both nostrils daily. Patient taking differently: Place 2 sprays into both nostrils daily as needed for allergies. 09/17/21  Yes Storm Frisk, MD  hydrocortisone-pramoxine (PROCTOFOAM [facility]) rectal foam Place rectally 3 (three) times daily. Patient taking differently: Place 1 applicator rectally daily. 01/28/23  Yes McClung, Marzella Schlein, PA-C  Menthol-Camphor (TIGER BALM ARTHRITIS RUB EX) Apply 1 application topically daily as needed (knee pain).   Yes [provider]  norethindrone (AYGESTIN) 5 MG tablet Take 3 tablets (15 mg total) by mouth daily. Patient taking differently: Take 10 mg by mouth daily. 01/20/23  Yes Reva Bores, MD  Psyllium (METAMUCIL PO) Take 1 tablet by mouth daily.   Yes [provider]  sertraline (ZOLOFT) 100 MG tablet Take 1 tablet (100 mg total) by mouth at bedtime. 01/28/23  Yes McClung, Marzella Schlein, PA-C  Vitamin D, Ergocalciferol, (DRISDOL) 1.25 MG (50000 UNIT) CAPS capsule Take 1 capsule (50,000 Units total) by mouth every 7 (seven) days. Patient taking differently: Take 50,000 Units by mouth every Friday. 01/29/23  Yes McClung, Angela M, PA-C  hydrocortisone (ANUSOL-HC) 2.5 % rectal cream Place 1 application. rectally 2 (two) times daily. Patient not taking: Reported on 03/29/2023 09/17/21   Storm Frisk, MD      Allergies    Latex    Review of Systems   Review of Systems  All other systems  reviewed and are negative.   Physical Exam Updated Vital Signs BP 123/85   Pulse 83   Temp 98.3 F (36.8 C) (Oral)   Resp 17   SpO2 100%  Physical Exam Vitals and nursing note reviewed.  Constitutional:      General: She is not in acute distress.    Appearance: Normal appearance. She is well-developed.  HENT:     Head: Normocephalic and atraumatic.  Eyes:     Conjunctiva/sclera: Conjunctivae normal.     Pupils: Pupils are equal, round, and reactive to light.  Cardiovascular:      Rate and Rhythm: Normal rate and regular rhythm.     Heart sounds: Normal heart sounds.  Pulmonary:     Effort: Pulmonary effort is normal. No respiratory distress.     Breath sounds: Normal breath sounds.  Chest:     Chest wall: Tenderness present.     Comments: Mild diffuse tenderness to the anterior chest wall that appears to reproduce the patient's symptoms as reported Abdominal:     General: There is no distension.     Palpations: Abdomen is soft.     Tenderness: There is no abdominal tenderness.  Musculoskeletal:        General: No deformity. Normal range of motion.     Cervical back: Normal range of motion and neck supple.  Skin:    General: Skin is warm and dry.  Neurological:     General: No focal deficit present.     Mental Status: She is alert and oriented to person, place, and time.     ED Results / Procedures / Treatments   Labs (all labs ordered are listed, but only abnormal results are displayed) Labs Reviewed  CBC - Abnormal; Notable for the following components:      Result Value   Hemoglobin 11.8 (*)    Platelets 457 (*)    All other components within normal limits  COMPREHENSIVE METABOLIC PANEL - Abnormal; Notable for the following components:   CO2 17 (*)    Alkaline Phosphatase 37 (*)    Total Bilirubin 0.2 (*)    All other components within normal limits  HCG, SERUM, QUALITATIVE  LIPASE, BLOOD  D-DIMER, QUANTITATIVE (NOT AT [facility])  TROPONIN I (HIGH SENSITIVITY)    EKG EKG Interpretation Date/Time:  Sunday March 29 2023 07:19:28 EDT Ventricular Rate:  94 PR Interval:  122 QRS Duration:  72 QT Interval:  368 QTC Calculation: 460 R Axis:   72  Text Interpretation: Normal sinus rhythm Normal ECG When compared with ECG of 14-May-2019 17:55, PREVIOUS ECG IS PRESENT Confirmed by Kristine Royal [phone]) on 03/29/2023 7:50:41 AM  Radiology DG Chest 2 View  Result Date: 03/29/2023 CLINICAL DATA:  42 year old female with history of chest pain  since yesterday. EXAM: CHEST - 2 VIEW COMPARISON:  Chest x-ray 07/07/2022. FINDINGS: Lung volumes are normal. No consolidative airspace disease. No pleural effusions. No pneumothorax. No pulmonary nodule or mass noted. Pulmonary vasculature and the cardiomediastinal silhouette are within normal limits. IMPRESSION: No radiographic evidence of acute cardiopulmonary disease. Electronically Signed   By: Trudie Reed [name]   On: 03/29/2023 09:11    Procedures Procedures    Medications Ordered in ED Medications  ibuprofen (ADVIL) tablet 600 mg (600 mg Oral Given 03/29/23 1601)    ED Course/ Medical Decision Making/ A&P  Medical Decision Making Amount and/or Complexity of Data Reviewed Labs: ordered. Radiology: ordered.    Medical Screen Complete  This patient presented to the ED with complaint of chest pain.  This complaint involves an extensive number of treatment options. The initial differential diagnosis includes, but is not limited to, chest wall pain, ACS, metabolic abnormality, pulmonary embolism, etc.  This presentation is: Acute, Self-Limited, Previously Undiagnosed, Uncertain Prognosis, Complicated, Systemic Symptoms, and Threat to Life/Bodily Function  Patient presents with complaint of anterior chest wall pain.  Described symptoms are not consistent with likely ACS.  EKG is without acute ischemia.  Troponin is without significant elevation.  Additionally, patient with D-dimer less than 0.27.  Chest x-ray is without acute abnormality.  Screening labs including CBC, CMP, lipase are also without significant abnormality.  Patient does feel improved after ED evaluation and treatment.  Patient understands need for close outpatient follow-up.  Strict return precautions given understood.  Additional history obtained: External records from outside sources obtained and reviewed including prior ED visits and prior Inpatient records.    Lab  Tests:  I ordered and personally interpreted labs.  The pertinent results include: CBC, CMP, troponin, lipase, D-dimer, ECG   Imaging Studies ordered:  I ordered imaging studies including chest x-ray I independently visualized and interpreted obtained imaging which showed NAD I agree with the radiologist interpretation.   Cardiac Monitoring:  The patient was maintained on a cardiac monitor.  I personally viewed and interpreted the cardiac monitor which showed an underlying rhythm of: NSR   Medicines ordered:  I ordered medication including ibuprofen for pain Reevaluation of the patient after these medicines showed that the patient: improved  Problem List / ED Course:  Atypical chest pain   Reevaluation:  After the interventions noted above, I reevaluated the patient and found that they have: improved   Disposition:  After consideration of the diagnostic results and the patients response to treatment, I feel that the patent would benefit from close outpatient follow-up.          Final Clinical Impression(s) / ED Diagnoses Final diagnoses:  Atypical chest pain    Rx / DC Orders ED Discharge Orders     None         Wynetta Fines, MD 03/29/23 [phone]

## 2023-03-29 NOTE — Discharge Instructions (Signed)
Return for any problem.  ?

## 2023-03-31 ENCOUNTER — Inpatient Hospital Stay (INDEPENDENT_AMBULATORY_CARE_PROVIDER_SITE_OTHER): Payer: Medicaid Other | Admitting: Primary Care

## 2023-03-31 ENCOUNTER — Telehealth: Payer: Medicaid Other | Admitting: Physician Assistant

## 2023-03-31 ENCOUNTER — Ambulatory Visit: Payer: Self-pay

## 2023-03-31 DIAGNOSIS — R0789 Other chest pain: Secondary | ICD-10-CM | POA: Diagnosis not present

## 2023-03-31 DIAGNOSIS — M542 Cervicalgia: Secondary | ICD-10-CM | POA: Diagnosis not present

## 2023-03-31 MED ORDER — CYCLOBENZAPRINE HCL 10 MG PO TABS: 10.0000 mg | ORAL_TABLET | Freq: Three times a day (TID) | ORAL | 0 refills | Status: DC | PRN

## 2023-03-31 NOTE — Progress Notes (Signed)
Virtual Visit Consent   Leah Baker, you are scheduled for a virtual visit with a [facility] provider today. Just as with appointments in the office, your consent must be obtained to participate. Your consent will be active for this visit and any virtual visit you may have with one of our providers in the next 365 days. If you have a MyChart account, a copy of this consent can be sent to you electronically.  As this is a virtual visit, video technology does not allow for your provider to perform a traditional examination. This may limit your provider's ability to fully assess your condition. If your provider identifies any concerns that need to be evaluated in person or the need to arrange testing (such as labs, EKG, etc.), we will make arrangements to do so. Although advances in technology are sophisticated, we cannot ensure that it will always work on either your end or our end. If the connection with a video visit is poor, the visit may have to be switched to a telephone visit. With either a video or telephone visit, we are not always able to ensure that we have a secure connection.  By engaging in this virtual visit, you consent to the provision of healthcare and authorize for your insurance to be billed (if applicable) for the services provided during this visit. Depending on your insurance coverage, you may receive a charge related to this service.  I need to obtain your verbal consent now. Are you willing to proceed with your visit today? Leah Baker has provided verbal consent on 03/31/2023 for a virtual visit (video or telephone). Leah Baker, [place]  Date: 03/31/2023 12:01 PM  Virtual Visit via Video Note   I, Leah Baker, connected with  Leah Baker  ([id number], [birth year]) on 03/31/23 at 11:45 AM EDT by a video-enabled telemedicine application and verified that I am speaking with the correct person using two identifiers.  Location: Patient: Virtual Visit  Location Patient: Home Provider: Virtual Visit Location Provider: Home Office   I discussed the limitations of evaluation and management by telemedicine and the availability of in person appointments. The patient expressed understanding and agreed to proceed.    History of Present Illness: Leah Baker is a 42 y.o. who identifies as a female who was assigned female at birth, and is being seen today for continued soreness of R chest and breast after incident with heavy lifting but then actually bumped up her R chest/breast against a cabinet. Left small brusie on the breast but felt fine other than mild soreness. , now with some associated tightness and pulling sensation int he R side of her neck and shoulder over past couple of days. Initially was evaluated Saturday at the ER. Workup included EKG (NSR), Labs (negative troponin and d-dimer) and CXr (unremarkable).  OTC -- Advil/Tylenol combo since Sunday, compresses  HPI: HPI  Problems:  Patient Active Problem List   Diagnosis Date Noted   Recurrent acute suppurative otitis media without spontaneous rupture of tympanic membrane of both sides 08/19/2022   Primary hypertension 12/10/2021   Medication side effect 10/03/2021   Dysuria 10/03/2021   Hereditary hemochromatosis (HCC) 09/17/2021   Alopecia 09/17/2021   Dental caries 09/17/2021   Carrier of hemochromatosis HFE gene mutation 08/07/2020   Lymphoid hyperplasia 07/30/2020   Anxiety 06/22/2020   Menorrhagia 11/27/2018   Rectal bleeding 11/26/2018   Liver lesion 09/09/2018   Hemorrhoid 11/29/2014   Iron deficiency anemia 05/04/2014   Mood  disorder (HCC) 02/20/2014   Tears of meniscus and ACL of left knee 09/08/2013   Chronic pain of left knee 05/19/2013   Depression 05/14/2011   H/O: cesarean section 08/28/2010    Allergies:  Allergies  Allergen Reactions   Latex Itching and Rash   Medications:  Current Outpatient Medications:    cyclobenzaprine (FLEXERIL) 10 MG tablet,  Take 1 tablet (10 mg total) by mouth 3 (three) times daily as needed., Disp: 15 tablet, Rfl: 0   acetaminophen (TYLENOL) 500 MG tablet, Take 1 tablet (500 mg total) by mouth every 6 (six) hours as needed. (Patient taking differently: Take 1,000 mg by mouth every 6 (six) hours as needed for mild pain (pain score 1-3) or headache.), Disp: 30 tablet, Rfl: 0   amLODipine (NORVASC) 10 MG tablet, Take 1 tablet (10 mg total) by mouth daily., Disp: 90 tablet, Rfl: 2   busPIRone (BUSPAR) 7.5 MG tablet, Take 1 tablet (7.5 mg total) by mouth 3 (three) times daily., Disp: 270 tablet, Rfl: 2   cetirizine (ZYRTEC ALLERGY) 10 MG tablet, Take 1 tablet (10 mg total) by mouth daily. (Patient taking differently: Take 10 mg by mouth daily as needed for allergies.), Disp: 30 tablet, Rfl: 11   diclofenac Sodium (VOLTAREN) 1 % GEL, Apply 2 g topically 4 (four) times daily. (Patient taking differently: Apply 2 g topically 4 (four) times daily as needed (pain).), Disp: 50 g, Rfl: 2   Ferrous Sulfate (IRON) 325 (65 Fe) MG TABS, Take by mouth in the morning and at bedtime., Disp: , Rfl:    fluticasone (FLONASE) 50 MCG/ACT nasal spray, Place 2 sprays into both nostrils daily. (Patient taking differently: Place 2 sprays into both nostrils daily as needed for allergies.), Disp: 16 g, Rfl: 6   hydrocortisone (ANUSOL-HC) 2.5 % rectal cream, Place 1 application. rectally 2 (two) times daily. (Patient not taking: Reported on 03/29/2023), Disp: 30 g, Rfl: 2   hydrocortisone-pramoxine (PROCTOFOAM HC) rectal foam, Place rectally 3 (three) times daily. (Patient taking differently: Place 1 applicator rectally daily.), Disp: 10 g, Rfl: 3   Menthol-Camphor (TIGER BALM ARTHRITIS RUB EX), Apply 1 application topically daily as needed (knee pain)., Disp: , Rfl:    norethindrone (AYGESTIN) 5 MG tablet, Take 3 tablets (15 mg total) by mouth daily. (Patient taking differently: Take 10 mg by mouth daily.), Disp: 90 tablet, Rfl: 3   Psyllium (METAMUCIL  PO), Take 1 tablet by mouth daily., Disp: , Rfl:    sertraline (ZOLOFT) 100 MG tablet, Take 1 tablet (100 mg total) by mouth at bedtime., Disp: 60 tablet, Rfl: 3   Vitamin D, Ergocalciferol, (DRISDOL) 1.25 MG (50000 UNIT) CAPS capsule, Take 1 capsule (50,000 Units total) by mouth every 7 (seven) days. (Patient taking differently: Take 50,000 Units by mouth every Friday.), Disp: 16 capsule, Rfl: 0 No current facility-administered medications for this visit.  Facility-Administered Medications Ordered in Other Visits:    bupivacaine liposome (EXPAREL) 1.3 % injection 266 mg, 20 mL, Infiltration, Once, Almond Lint, MD  Observations/Objective: Patient is well-developed, well-nourished in no acute distress.  Resting comfortably at home.  Head is normocephalic, atraumatic.  No labored breathing. Speech is clear and coherent with logical content.  Patient is alert and oriented at baseline.   Assessment and Plan: 1. Musculoskeletal chest pain - cyclobenzaprine (FLEXERIL) 10 MG tablet; Take 1 tablet (10 mg total) by mouth 3 (three) times daily as needed.  Dispense: 15 tablet; Refill: 0  2. Neck pain on right side  Suspect continued  soft tissue inflammation from trauma coupled with underlying muscular inflammation, tension and spam, now including her trapezius. Supportive measures and OTC medications reviewed. Will add on Flexeril. Strict in-person precautions reviewed.   Follow Up Instructions: I discussed the assessment and treatment plan with the patient. The patient was provided an opportunity to ask questions and all were answered. The patient agreed with the plan and demonstrated an understanding of the instructions.  A copy of instructions were sent to the patient via MyChart unless otherwise noted below.   The patient was advised to call back or seek an in-person evaluation if the symptoms worsen or if the condition fails to improve as anticipated.    Leah Climes, PA-C

## 2023-03-31 NOTE — Patient Instructions (Addendum)
Debroah Loop, thank you for joining Piedad Climes, PA-C for today's virtual visit.  While this provider is not your primary care provider (PCP), if your PCP is located in our provider database this encounter information will be shared with them immediately following your visit.   A [facility] MyChart account gives you access to today's visit and all your visits, tests, and labs performed at [facility] " click here if you don't have a [facility] MyChart account or go to [email or web address]  Consent: (Patient) Leah Baker provided verbal consent for this virtual visit at the beginning of the encounter.  Current Medications:  Current Outpatient Medications:    cyclobenzaprine (FLEXERIL) 10 MG tablet, Take 1 tablet (10 mg total) by mouth 3 (three) times daily as needed., Disp: 15 tablet, Rfl: 0   acetaminophen (TYLENOL) 500 MG tablet, Take 1 tablet (500 mg total) by mouth every 6 (six) hours as needed. (Patient taking differently: Take 1,000 mg by mouth every 6 (six) hours as needed for mild pain (pain score 1-3) or headache.), Disp: 30 tablet, Rfl: 0   amLODipine (NORVASC) 10 MG tablet, Take 1 tablet (10 mg total) by mouth daily., Disp: 90 tablet, Rfl: 2   busPIRone (BUSPAR) 7.5 MG tablet, Take 1 tablet (7.5 mg total) by mouth 3 (three) times daily., Disp: 270 tablet, Rfl: 2   cetirizine (ZYRTEC ALLERGY) 10 MG tablet, Take 1 tablet (10 mg total) by mouth daily. (Patient taking differently: Take 10 mg by mouth daily as needed for allergies.), Disp: 30 tablet, Rfl: 11   diclofenac Sodium (VOLTAREN) 1 % GEL, Apply 2 g topically 4 (four) times daily. (Patient taking differently: Apply 2 g topically 4 (four) times daily as needed (pain).), Disp: 50 g, Rfl: 2   Ferrous Sulfate (IRON) 325 (65 Fe) MG TABS, Take by mouth in the morning and at bedtime., Disp: , Rfl:    fluticasone (FLONASE) 50 MCG/ACT nasal spray, Place 2 sprays into both nostrils daily. (Patient taking  differently: Place 2 sprays into both nostrils daily as needed for allergies.), Disp: 16 g, Rfl: 6   hydrocortisone (ANUSOL-HC) 2.5 % rectal cream, Place 1 application. rectally 2 (two) times daily. (Patient not taking: Reported on 03/29/2023), Disp: 30 g, Rfl: 2   hydrocortisone-pramoxine (PROCTOFOAM HC) rectal foam, Place rectally 3 (three) times daily. (Patient taking differently: Place 1 applicator rectally daily.), Disp: 10 g, Rfl: 3   Menthol-Camphor (TIGER BALM ARTHRITIS RUB EX), Apply 1 application topically daily as needed (knee pain)., Disp: , Rfl:    norethindrone (AYGESTIN) 5 MG tablet, Take 3 tablets (15 mg total) by mouth daily. (Patient taking differently: Take 10 mg by mouth daily.), Disp: 90 tablet, Rfl: 3   Psyllium (METAMUCIL PO), Take 1 tablet by mouth daily., Disp: , Rfl:    sertraline (ZOLOFT) 100 MG tablet, Take 1 tablet (100 mg total) by mouth at bedtime., Disp: 60 tablet, Rfl: 3   Vitamin D, Ergocalciferol, (DRISDOL) 1.25 MG (50000 UNIT) CAPS capsule, Take 1 capsule (50,000 Units total) by mouth every 7 (seven) days. (Patient taking differently: Take 50,000 Units by mouth every Friday.), Disp: 16 capsule, Rfl: 0 No current facility-administered medications for this visit.  Facility-Administered Medications Ordered in Other Visits:    bupivacaine liposome (EXPAREL) 1.3 % injection 266 mg, 20 mL, Infiltration, Once, Almond Lint, MD   Medications ordered in this encounter:  Meds ordered this encounter  Medications   cyclobenzaprine (FLEXERIL) 10 MG tablet    Sig: Take 1  tablet (10 mg total) by mouth 3 (three) times daily as needed.    Dispense:  15 tablet    Refill:  0    Order Specific Question:   Supervising Provider    Answer:   Merrilee Jansky [id number]     *If you need refills on other medications prior to your next appointment, please contact your pharmacy*  Follow-Up: Call back or seek an in-person evaluation if the symptoms worsen or if the condition fails  to improve as anticipated.  [facility] [phone]  Other Instructions Continue alternating Tylenol and Ibuprofen OTC as needed. Take the Flexeril as directed -- remember no driving while on medication in the daytime.  Continue use of heat as discussed. If not easing up over next 48 hours or any progressing or new symptoms, you need an in-person reevaluation ASAP.    If you have been instructed to have an in-person evaluation today at a local Urgent Care facility, please use the link below. It will take you to a list of all of our available [facility], including address, phone number and hours of operation. Please do not delay care.  [facility]  If you or a family member do not have a primary care provider, use the link below to schedule a visit and establish care. When you choose a [facility] primary care physician or advanced practice provider, you gain a long-term partner in health. Find a Primary Care Provider  Learn more about [facility]'s in-office and virtual care options: [facility] - Get Care Now

## 2023-03-31 NOTE — Telephone Encounter (Signed)
Chief Complaint: Chest injury to the right breast/chest Symptoms: 8/10 pain, bruising to the breast, swelling to the right neck up from breast Frequency: Injury happened last Tues/Wednesday Pertinent Negatives: Patient denies other symptoms Disposition: [] ED /[] Urgent Care (no appt availability in office) / [] Appointment(In office/virtual)/ [x]  [facility]/ [] Home Care/ [] Refused Recommended Disposition /[] [facility]/ []  Follow-up with PCP Additional Notes: Patient says she hit her right breast on a cabinet door last Tuesday/Wednesday and she ended up going to the ED on Sunday due to SOB and pain in the chest. She says the ED doctor told her it was muscular and to take Tylenol/Ibuprofen combination pill. She says she's done everything that she was told to do, but is still hurting, no SOB. Advised another visit. No availability at [facility] Offered to schedule HFU at another community clinic location, patient declined saying she would not have a ride to get there. Mobile is in [place] and unable to get there. Offered virtual UC visit, she agreed, scheduled for 1145 today.   Reason for Disposition  [1] MODERATE pain (e.g., interferes with normal activities) AND [2] high-risk adult (e.g., age > 60 years, osteoporosis, chronic steroid use)  Answer Assessment - Initial Assessment Questions 1. MECHANISM: "How did the injury happen?"     Bumped against cabinet Right breast 2. ONSET: "When did the injury happen?" (Minutes or hours ago)     Tuesday/Wednesday last week 3. LOCATION: "Where on the chest is the injury located?"     Right breast, bruising 4. APPEARANCE: "What does the injury look like?"     Bruise right breast 5. SEVERITY: "Any difficulty with breathing?"     Not since Sunday when went to ED 6. SIZE: For cuts, bruises, or swelling, ask: "How large is it?" (e.g., inches or centimeters)     Swelling up around neck to the right 7. PAIN: "Is there pain?" If Yes, ask: "How bad is  the pain?"   (e.g., Scale 1-10; or mild, moderate, severe)     8  Protocols used: Chest Injury-A-AH

## 2023-04-19 ENCOUNTER — Other Ambulatory Visit: Payer: Self-pay | Admitting: Physician Assistant

## 2023-04-19 DIAGNOSIS — E559 Vitamin D deficiency, unspecified: Secondary | ICD-10-CM

## 2023-04-20 NOTE — Telephone Encounter (Signed)
Requested medications are due for refill today.  no  Requested medications are on the active medications list.  yes  Last refill. 01/29/2023 #16 0 rf  Future visit scheduled.   no  Notes to clinic.  Medication refill needs to be reviewed by provider.    Requested Prescriptions  Pending Prescriptions Disp Refills   Vitamin D, Ergocalciferol, (DRISDOL) 1.25 MG (50000 UNIT) CAPS capsule [Pharmacy Med Name: Vitamin D (Ergocalciferol) 1.25 MG (50000 UT) Oral Capsule] 16 capsule 0    Sig: Take 1 capsule by mouth once a week     Endocrinology:  Vitamins - Vitamin D Supplementation 2 Failed - 04/19/2023 11:12 AM      Failed - Manual Review: Route requests for 50,000 IU strength to the provider      Failed - Vitamin D in normal range and within 360 days    Vit D, 25-Hydroxy  Date Value Ref Range Status  01/28/2023 19.8 (L) 30.0 - 100.0 ng/mL Final    Comment:    Vitamin D deficiency has been defined by the Institute of Medicine and an Endocrine Society practice guideline as a level of serum 25-OH vitamin D less than 20 ng/mL (1,2). The Endocrine Society went on to further define vitamin D insufficiency as a level between 21 and 29 ng/mL (2). 1. IOM (Institute of Medicine). 2010. Dietary reference    intakes for calcium and D. [place]: The    [employer]. 2. Holick MF, Binkley [place], Bischoff-Ferrari HA, et al.    Evaluation, treatment, and prevention of vitamin D    deficiency: an Endocrine Society clinical practice    guideline. JCEM. 2011 Jul; 96(7):1911-30.          Passed - Ca in normal range and within 360 days    Calcium  Date Value Ref Range Status  03/29/2023 8.9 8.9 - 10.3 mg/dL Final         Passed - Valid encounter within last 12 months    Recent Outpatient Visits           2 months ago Vitamin D deficiency   [facility] [place]. [facility], Marylene Land M, [place]   9 months ago Dysfunction of Eustachian  tube, unspecified laterality   [facility] [place]. [facility], Marylene Land M, [place]   10 months ago Acute bacterial pharyngitis   [facility], Kasandra Knudsen, [place]   1 year ago Primary hypertension   [facility] [place]. [facility] Drucilla Chalet, RPH-CPP   1 year ago Primary hypertension   [facility] [place]. [facility] Drucilla Chalet, RPH-CPP

## 2023-05-05 ENCOUNTER — Inpatient Hospital Stay: Payer: Medicaid Other | Admitting: Hematology

## 2023-05-05 ENCOUNTER — Inpatient Hospital Stay: Payer: Medicaid Other

## 2023-05-21 ENCOUNTER — Ambulatory Visit: Payer: Medicaid Other | Attending: Physician Assistant | Admitting: Physician Assistant

## 2023-05-21 VITALS — BP 113/76 | HR 78 | Ht 64.0 in | Wt 172.8 lb

## 2023-05-21 DIAGNOSIS — Z8719 Personal history of other diseases of the digestive system: Secondary | ICD-10-CM | POA: Diagnosis not present

## 2023-05-21 DIAGNOSIS — Z1231 Encounter for screening mammogram for malignant neoplasm of breast: Secondary | ICD-10-CM

## 2023-05-21 DIAGNOSIS — R109 Unspecified abdominal pain: Secondary | ICD-10-CM | POA: Diagnosis not present

## 2023-05-21 DIAGNOSIS — K648 Other hemorrhoids: Secondary | ICD-10-CM

## 2023-05-21 LAB — POCT URINALYSIS DIP (CLINITEK)
Bilirubin, UA: NEGATIVE
Glucose, UA: NEGATIVE mg/dL
Ketones, POC UA: NEGATIVE mg/dL
Leukocytes, UA: NEGATIVE
Nitrite, UA: NEGATIVE
POC PROTEIN,UA: NEGATIVE
Spec Grav, UA: 1.02 (ref 1.010–1.025)
Urobilinogen, UA: 0.2 U/dL
pH, UA: 6.5 (ref 5.0–8.0)

## 2023-05-21 MED ORDER — OMEPRAZOLE 20 MG PO CPDR: 20.0000 mg | DELAYED_RELEASE_CAPSULE | Freq: Every day | ORAL | 3 refills | Status: AC

## 2023-05-21 NOTE — Progress Notes (Signed)
Patient ID: MARKEYLA CUTBIRTH, female   [name]: [birth year], 42 y.o.   MRN: [id number]   Aprile Fagnani, is a 42 y.o. female  WNU:[id number]  IHK:[id number]  [name] - 22-May-1981  Chief Complaint  Patient presents with   Dysmenorrhea    Severe menstrual cramping for the past 2 day and 4 days of rectal bleeding after a bowel movement.         Subjective:   Beau Costantino is a 42 y.o. female here today for abdominal cramping feeling like she's about to have a period.  She is on a progestin only pill and usu doesn't have periods on that  But also her hemorrhoids are flaring and she has had blood with that.  She previously had a banding procedure done on hemorrhoids which was extremely painful and did not prove effective.     No problems updated.  ALLERGIES: Allergies  Allergen Reactions   Latex Itching and Rash    PAST MEDICAL HISTORY: Past Medical History:  Diagnosis Date   Abnormal uterine bleeding (AUB)    Anemia    Phreesia 06/08/2020   Anxiety    Blood in stool last 2 years   Depression    Depression    Phreesia 06/08/2020   Hemorrhoids    History of blood transfusion 05/2019 2 units   iron transfusion given also   History of lower GI bleeding dec 2021 active problem   s/p  colonoscopy 09-01-2018,  hemorrhoids   IDA (iron deficiency anemia)    Inguinal lymphadenopathy    CT 09-14-2018  right side, followed by pcp   PONV (postoperative nausea and vomiting)    only happened once   Wears glasses    Wears glasses     MEDICATIONS AT HOME: Prior to Admission medications   Medication Sig Start Date End Date Taking? Authorizing Provider  acetaminophen (TYLENOL) 500 MG tablet Take 1 tablet (500 mg total) by mouth every 6 (six) hours as needed. Patient taking differently: Take 1,000 mg by mouth every 6 (six) hours as needed for mild pain (pain score 1-3) or headache. 11/11/19  Yes Lamptey, Britta Mccreedy, MD  amLODipine (NORVASC) 10 MG tablet Take 1 tablet (10 mg total) by mouth daily.  01/28/23  Yes Badr Piedra, Marzella Schlein, PA-C  busPIRone (BUSPAR) 7.5 MG tablet Take 1 tablet (7.5 mg total) by mouth 3 (three) times daily. 01/28/23  Yes Anders Simmonds, PA-C  cetirizine (ZYRTEC ALLERGY) 10 MG tablet Take 1 tablet (10 mg total) by mouth daily. Patient taking differently: Take 10 mg by mouth daily as needed for allergies. 06/10/22  Yes Mayers, Cari S, PA-C  diclofenac Sodium (VOLTAREN) 1 % GEL Apply 2 g topically 4 (four) times daily. Patient taking differently: Apply 2 g topically 4 (four) times daily as needed (pain). 01/22/22  Yes Storm Frisk, MD  Ferrous Sulfate (IRON) 325 (65 Fe) MG TABS Take by mouth in the morning and at bedtime.   Yes [provider]  fluticasone (FLONASE) 50 MCG/ACT nasal spray Place 2 sprays into both nostrils daily. Patient taking differently: Place 2 sprays into both nostrils daily as needed for allergies. 09/17/21  Yes Storm Frisk, MD  hydrocortisone (ANUSOL-HC) 2.5 % rectal cream Place 1 application. rectally 2 (two) times daily. 09/17/21  Yes Storm Frisk, MD  hydrocortisone-pramoxine (PROCTOFOAM [facility]) rectal foam Place rectally 3 (three) times daily. Patient taking differently: Place 1 applicator rectally daily. 01/28/23  Yes Yanilen Adamik, Marzella Schlein, PA-C  Menthol-Camphor (TIGER BALM ARTHRITIS RUB  EX) Apply 1 application topically daily as needed (knee pain).   Yes [provider]  norethindrone (AYGESTIN) 5 MG tablet Take 3 tablets (15 mg total) by mouth daily. Patient taking differently: Take 10 mg by mouth daily. 01/20/23  Yes Reva Bores, MD  omeprazole (PRILOSEC) 20 MG capsule Take 1 capsule (20 mg total) by mouth daily. 05/21/23  Yes Gracen Southwell M, PA-C  Psyllium (METAMUCIL PO) Take 1 tablet by mouth daily.   Yes [provider]  sertraline (ZOLOFT) 100 MG tablet Take 1 tablet (100 mg total) by mouth at bedtime. 01/28/23  Yes Anders Simmonds, PA-C  Vitamin D, Ergocalciferol, (DRISDOL) 1.25 MG (50000 UNIT) CAPS  capsule Take 1 capsule by mouth once a week 04/20/23  Yes Newlin, Enobong, MD    ROS: Neg HEENT Neg resp Neg cardiac Neg GU Neg MS Neg psych Neg neuro  Objective:   Vitals:   05/21/23 0959  BP: 113/76  Pulse: 78  Weight: 172 lb 12.8 oz (78.4 kg)  Height: 5\' 4"  (1.626 m)   Exam General appearance : Awake, alert, not in any distress. Speech Clear. Not toxic looking HEENT: Atraumatic and Normocephalic Neck: Supple, no JVD. No cervical lymphadenopathy.  Chest: Good air entry bilaterally, CTAB.  No rales/rhonchi/wheezing CVS: S1 S2 regular, no murmurs.  Extremities: B/L Lower Ext shows no edema, both legs are warm to touch Neurology: Awake alert, and oriented X 3, CN II-XII intact, Non focal Skin: No Rash  Data Review Lab Results  Component Value Date   HGBA1C 5.5 07/07/2022    Assessment & Plan   1. Abdominal cramping Non acute abdomen - POCT URINALYSIS DIP (CLINITEK) - omeprazole (PRILOSEC) 20 MG capsule; Take 1 capsule (20 mg total) by mouth daily.  Dispense: 30 capsule; Refill: 3 - CBC with Differential  2. Encounter for screening mammogram for malignant neoplasm of breast (Primary) - MS 3D SCR MAMMO BILAT BR (aka MM); Future  3. Other hemorrhoids I advised OTC enabrel from [facility] - Ambulatory referral to General Surgery  4. H/O: GI bleed No s/sx currently - Ambulatory referral to General Surgery - omeprazole (PRILOSEC) 20 MG capsule; Take 1 capsule (20 mg total) by mouth daily.  Dispense: 30 capsule; Refill: 3 - CBC with Differential    Return in about 3 months (around 08/19/2023) for PCP for chronic conditions-please assign her to someone here other than Dr Delford Field.  The patient was given clear instructions to go to ER or return to medical center if symptoms don't improve, worsen or new problems develop. The patient verbalized understanding. The patient was told to call to get lab results if they haven't heard anything in the next week.      Georgian Co, PA-C [facility] [place], [place] [phone]   05/21/2023, 12:29 PM

## 2023-05-22 ENCOUNTER — Telehealth: Payer: Self-pay | Admitting: Critical Care Medicine

## 2023-05-22 ENCOUNTER — Telehealth: Payer: Self-pay

## 2023-05-22 LAB — CBC WITH DIFFERENTIAL/PLATELET
Basophils Absolute: 0.1 10*3/uL (ref 0.0–0.2)
Basos: 1 %
EOS (ABSOLUTE): 0.1 10*3/uL (ref 0.0–0.4)
Eos: 2 %
Hematocrit: 35.4 % (ref 34.0–46.6)
Hemoglobin: 11.5 g/dL (ref 11.1–15.9)
Immature Grans (Abs): 0 10*3/uL (ref 0.0–0.1)
Immature Granulocytes: 0 %
Lymphocytes Absolute: 2.5 10*3/uL (ref 0.7–3.1)
Lymphs: 33 %
MCH: 29.6 pg (ref 26.6–33.0)
MCHC: 32.5 g/dL (ref 31.5–35.7)
MCV: 91 fL (ref 79–97)
Monocytes Absolute: 0.6 10*3/uL (ref 0.1–0.9)
Monocytes: 8 %
Neutrophils Absolute: 4.3 10*3/uL (ref 1.4–7.0)
Neutrophils: 56 %
Platelets: 475 10*3/uL — ABNORMAL HIGH (ref 150–450)
RBC: 3.88 x10E6/uL (ref 3.77–5.28)
RDW: 12.6 % (ref 11.7–15.4)
WBC: 7.6 10*3/uL (ref 3.4–10.8)

## 2023-05-22 NOTE — Telephone Encounter (Signed)
Marylene Land from  [facility]  call saying that they can treat the Hemorrhoids  but we need a  Gi referral for  H/O: GI bleed   Thank you .

## 2023-05-22 NOTE — Telephone Encounter (Signed)
Patient viewed results and Doctor comment through [facility]

## 2023-05-22 NOTE — Telephone Encounter (Signed)
Leah Baker I have retired and am prn. Pls route to [employer]. I do not have daily acess to place orders

## 2023-05-22 NOTE — Telephone Encounter (Signed)
-----   Message from [facility] sent at 05/22/2023  8:21 AM EST ----- No anemia or signs of blood loss.  Make sure you are drinking 64 to 80 ounces water daily.  Thanks, Georgian Co, PA-C

## 2023-05-24 ENCOUNTER — Other Ambulatory Visit: Payer: Self-pay | Admitting: Family Medicine

## 2023-05-24 DIAGNOSIS — N921 Excessive and frequent menstruation with irregular cycle: Secondary | ICD-10-CM

## 2023-06-01 ENCOUNTER — Other Ambulatory Visit: Payer: Self-pay | Admitting: Physician Assistant

## 2023-06-01 DIAGNOSIS — K649 Unspecified hemorrhoids: Secondary | ICD-10-CM

## 2023-06-08 ENCOUNTER — Telehealth: Payer: Self-pay | Admitting: Critical Care Medicine

## 2023-06-08 NOTE — Telephone Encounter (Signed)
 Copied from CRM [id number]. Topic: General - Other >> Jun 05, 2023 10:22 AM Turkey B wrote: Reason for CRM: Dawn from [facility] states they have a referral for pt for hemorrhoids but they are not inn with pt's insurance

## 2023-06-15 ENCOUNTER — Other Ambulatory Visit: Payer: Self-pay

## 2023-06-15 DIAGNOSIS — D500 Iron deficiency anemia secondary to blood loss (chronic): Secondary | ICD-10-CM

## 2023-06-16 ENCOUNTER — Inpatient Hospital Stay: Payer: Medicaid Other | Admitting: Hematology

## 2023-06-16 ENCOUNTER — Inpatient Hospital Stay: Payer: Medicaid Other

## 2023-06-19 ENCOUNTER — Other Ambulatory Visit: Payer: Medicaid Other

## 2023-08-19 ENCOUNTER — Encounter: Payer: Self-pay | Admitting: Internal Medicine

## 2023-08-19 ENCOUNTER — Ambulatory Visit: Payer: Medicaid Other | Attending: Internal Medicine | Admitting: Internal Medicine

## 2023-08-19 VITALS — BP 126/78 | HR 103 | Temp 98.2°F | Ht 64.0 in | Wt 174.0 lb

## 2023-08-19 DIAGNOSIS — Z2821 Immunization not carried out because of patient refusal: Secondary | ICD-10-CM | POA: Diagnosis not present

## 2023-08-19 DIAGNOSIS — F411 Generalized anxiety disorder: Secondary | ICD-10-CM | POA: Diagnosis not present

## 2023-08-19 DIAGNOSIS — I10 Essential (primary) hypertension: Secondary | ICD-10-CM | POA: Diagnosis not present

## 2023-08-19 DIAGNOSIS — D500 Iron deficiency anemia secondary to blood loss (chronic): Secondary | ICD-10-CM | POA: Diagnosis not present

## 2023-08-19 DIAGNOSIS — K625 Hemorrhage of anus and rectum: Secondary | ICD-10-CM

## 2023-08-19 DIAGNOSIS — K649 Unspecified hemorrhoids: Secondary | ICD-10-CM | POA: Diagnosis not present

## 2023-08-19 DIAGNOSIS — N3281 Overactive bladder: Secondary | ICD-10-CM | POA: Diagnosis not present

## 2023-08-19 DIAGNOSIS — R631 Polydipsia: Secondary | ICD-10-CM | POA: Diagnosis not present

## 2023-08-19 DIAGNOSIS — F339 Major depressive disorder, recurrent, unspecified: Secondary | ICD-10-CM | POA: Diagnosis not present

## 2023-08-19 DIAGNOSIS — Z1231 Encounter for screening mammogram for malignant neoplasm of breast: Secondary | ICD-10-CM

## 2023-08-19 LAB — GLUCOSE, POCT (MANUAL RESULT ENTRY): POC Glucose: 119 mg/dL — AB (ref 70–99)

## 2023-08-19 LAB — POCT GLYCOSYLATED HEMOGLOBIN (HGB A1C): HbA1c POC (<> result, manual entry): 5.7 % (ref 4.0–5.6)

## 2023-08-19 MED ORDER — BUSPIRONE HCL 10 MG PO TABS: 10.0000 mg | ORAL_TABLET | Freq: Three times a day (TID) | ORAL | 3 refills | Status: AC

## 2023-08-19 MED ORDER — OXYBUTYNIN CHLORIDE ER 5 MG PO TB24: 5.0000 mg | ORAL_TABLET | Freq: Every day | ORAL | 6 refills | Status: AC

## 2023-08-19 NOTE — Progress Notes (Signed)
 Patient ID: DELYNDA Baker, female    [name]: January 31, 1981  MRN: [id number]  CC: Hypertension (HTN f/u. Herold Harms a vitamin D level check - experiencing pains in joints /Discuss kidney liver function - frequent urination & thirst, pressure on lower back /Requesting new referral to surgeon that accepts Medicaid/Yes to mammogram )   Subjective: Leah Baker is a 43 y.o. female who presents for chronic ds management and to establish with me.  Previous PCP was Dr. Delford Field. Her concerns today include:  History of HTN, hemorrhoids, anemia, mild thrombocytosis, anxiety/depression, vitamin D deficiency  Discussed the use of AI scribe software for clinical note transcription with the patient, who gave verbal consent to proceed.  History of Present Illness   The patient, with a history of hypertension, depression, and anxiety, presents for a blood pressure follow-up.   HTN:  She reports consistent adherence to her amlodipine 10mg  daily regimen. She monitors her blood pressure at home daily, noting a recent reading of 137/76, which is slightly above her usual range.  Some of her other readings were 128/92, 132/84.  She also reports occasional dizziness, but does not believe it is related to her blood pressure.  Polyuria/Polydipsia:  In the past month, the patient has noticed an increase in urination frequency and thirst. She describes the urine as clear, almost like water. She was previously on oxybutynin for similar symptoms but has been off the medication for some time.  She has not had any incontinence of urine but states that when she gets the urge to urinate she has to go right away to avoid having an accident.  The patient also reports ongoing issues with rectal bleeding due to hemorrhoids, despite multiple surgeries and treatments, including hemorrhoid banding.  Had colonoscopy last year by her gastroenterologist at [facility] Dr. Doretha Imus.  She takes Dulcolax daily to keep bowel movements soft and  regular.  Not seen a surgeon at [facility], Dr. Drue Dun, last year and had banding procedure but states it was very painful and she was awake during the procedure.  The surgeon told her that she may need a repeat procedure again.  She was referred to [facility] surgery but was called on told that they do not accept Medicaid. -She has history of anemia associated with the rectal bleeding.  She states that her gynecologist has her on a medication that has stopped her menses..  She expresses frustration and anxiety over the unresolved issue of rectal bleeding, which she believes is contributing to her mental health symptoms. She is currently on Zoloft 100mg  and Buspar 7.5 mg, taken three times a day, for depression and anxiety. 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 (Patient taking differently: Take 1,000 mg by mouth every 6 (six) hours as needed for mild pain (pain score  1-3) or headache.) 30 tablet 0   amLODipine (NORVASC) 10 MG tablet Take 1 tablet (10 mg total) by mouth daily. 90 tablet 2   cetirizine (ZYRTEC ALLERGY) 10 MG tablet Take 1 tablet (10 mg total) by mouth daily. (Patient taking differently: Take 10 mg by mouth daily as needed for allergies.) 30 tablet 11   diclofenac Sodium (VOLTAREN) 1 % GEL Apply 2 g topically 4 (four) times daily. (Patient taking differently: Apply 2 g topically 4 (four) times daily as needed (pain).) 50 g 2   Ferrous Sulfate (IRON) 325 (65 Fe) MG TABS Take by mouth in the morning and at bedtime.     fluticasone (FLONASE) 50 MCG/ACT nasal spray Place 2 sprays into both nostrils daily. (Patient taking differently: Place 2 sprays into both nostrils daily as needed for allergies.) 16 g 6   hydrocortisone (ANUSOL-HC) 2.5 % rectal cream Place 1 application. rectally 2 (two) times daily. 30 g 2   hydrocortisone-pramoxine (PROCTOFOAM HC) rectal foam Place rectally 3 (three) times daily. (Patient taking differently: Place 1 applicator rectally daily.) 10 g 3   Menthol-Camphor (TIGER BALM ARTHRITIS RUB EX) Apply 1 application topically daily as needed (knee pain).     norethindrone (AYGESTIN) 5 MG tablet Take 3 tablets (15 mg total) by mouth daily. (Patient taking differently: Take 10 mg by mouth daily.) 90 tablet 3   omeprazole (PRILOSEC) 20 MG capsule Take 1 capsule (20 mg total) by mouth daily. 30 capsule 3   Psyllium (METAMUCIL PO) Take 1 tablet by mouth daily.     sertraline (ZOLOFT) 100 MG tablet Take 1 tablet (100 mg total) by mouth at bedtime. 60 tablet 3   Vitamin D, Ergocalciferol, (DRISDOL) 1.25 MG (50000 UNIT) CAPS capsule Take 1 capsule by mouth once a week (Patient not taking: Reported on 08/19/2023) 16 capsule 0   Current Facility-Administered Medications on File Prior to Visit  Medication Dose Route Frequency Provider Last Rate Last Admin   bupivacaine liposome (EXPAREL) 1.3 % injection 266 mg  20 mL Infiltration Once Almond Lint, MD        Allergies  Allergen Reactions   Latex Itching and Rash    Social History   Socioeconomic History   Marital status: Significant Other    Spouse name: Not on file   Number of children: Not on file   Years of education: Not on file   Highest education level: Some college, no degree  Occupational History   Not on file  Tobacco Use   Smoking status: Former    Current packs/day: 0.00    Types: Cigarettes    Start date: 08/23/2003    Quit date: 08/22/2008    Years since quitting: 15.0   Smokeless tobacco: Never  Vaping Use   Vaping status: Never Used  Substance and Sexual Activity   Alcohol use: Not Currently   Drug use: Not Currently    Comment: per pt younger in college marijuana, none since   Sexual activity: Yes    Birth control/protection: Surgical  Other Topics Concern   Not on file  Social History Narrative   Not on file   Social  Drivers of Health   Financial Resource Strain: Medium Risk (08/19/2023)   Overall Financial Resource Strain (CARDIA)    Difficulty of Paying Living Expenses: Somewhat hard  Food Insecurity: Food Insecurity Present (08/19/2023)   Hunger Vital Sign    Worried About Running Out of Food in the Last Year: Sometimes true    Ran Out of Food in the Last Year: Sometimes true  Transportation Needs: No Transportation Needs (08/19/2023)   PRAPARE - Administrator, Civil Service (Medical): No    Lack of Transportation (Non-Medical): No  Physical Activity: Insufficiently Active (08/19/2023)   Exercise Vital Sign    Days of Exercise per Week: 3 days    Minutes of Exercise per Session: 20 min  Stress: Stress Concern Present (08/19/2023)   [employer] of Occupational Health - Occupational Stress Questionnaire    Feeling of Stress : Rather much  Social Connections: Socially Isolated (08/19/2023)   Social Connection and Isolation Panel [NHANES]    Frequency of Communication with Friends and Family: Once a week    Frequency  of Social Gatherings with Friends and Family: Once a week    Attends Religious Services: More than 4 times per year    Active Member of [employer] or Organizations: No    Attends Banker Meetings: Never    Marital Status: Never married  Intimate Partner Violence: Not At Risk (08/19/2023)   Humiliation, Afraid, Rape, and Kick questionnaire    Fear of Current or Ex-Partner: No    Emotionally Abused: No    Physically Abused: No    Sexually Abused: No    Family History  Problem Relation Age of Onset   Cancer Mother    Kidney failure Mother    Healthy Father    Breast cancer Neg Hx     Past Surgical History:  Procedure Laterality Date   BIOPSY  09/01/2018   Procedure: BIOPSY;  Surgeon: Kathi Der, MD;  Location: [facility];  Service: Gastroenterology;;   CESAREAN SECTION  01/15/2002   @[facility]    CESAREAN SECTION  03/17/2011   Procedure: CESAREAN SECTION;  Surgeon: Tereso Newcomer, MD;  Location: [facility];  Service: Gynecology;  Laterality: N/A;   COLONOSCOPY WITH PROPOFOL N/A 09/01/2018   Procedure: COLONOSCOPY WITH PROPOFOL;  Surgeon: Kathi Der, MD;  Location: [facility];  Service: Gastroenterology;  Laterality: N/A;   DILITATION & CURRETTAGE/HYSTROSCOPY WITH HYDROTHERMAL ABLATION N/A 08/22/2020   Procedure: DILATATION & CURETTAGE/HYSTEROSCOPY CERVICAL REPAIR;  Surgeon: Reva Bores, MD;  Location: [facility];  Service: Gynecology;  Laterality: N/A;   HYSTEROSCOPY WITH D & C  06-21-2009  dr constant @[facility]    w/  REMOVAL IUD    INGUINAL LYMPH NODE BIOPSY Right 04/12/2020   Procedure: RIGHT INGUINAL LYMPH NODE EXCISIONAL BIOPSY;  Surgeon: Almond Lint, MD;  Location: [facility] OR;  Service: General;  Laterality: Right;   IUD REMOVAL  06/21/2009   KNEE ARTHROSCOPY WITH ANTERIOR CRUCIATE LIGAMENT (ACL) REPAIR Left 09-08-2013   dr Eulah Pont  @[facility]    w/  meniscal repair   MULTIPLE TOOTH EXTRACTIONS  2012   RECTAL EXAM UNDER ANESTHESIA N/A 11/18/2018   Procedure:  ANORECTAL EXAM UNDER ANESTHESIA;  Surgeon: Andria Meuse, MD;  Location: [facility];  Service: General;  Laterality: N/A;   TUBAL LIGATION Bilateral 2012    ROS: Review of Systems Negative except as stated above  PHYSICAL EXAM: BP 126/78 (BP Location: Left Arm, Patient Position: Sitting, Cuff Size: Normal)   Pulse Marland Kitchen)  103   Temp 98.2 F (36.8 C) (Oral)   Ht 5\' 4"  (1.626 m)   Wt 174 lb (78.9 kg)   SpO2 99%   BMI 29.87 kg/m   Wt Readings from Last 3 Encounters:  08/19/23 174 lb (78.9 kg)  05/21/23 172 lb 12.8 oz (78.4 kg)  01/28/23 164 lb 12.8 oz (74.8 kg)    Physical Exam  General appearance - alert, well appearing, and in no distress Mental status - normal mood, behavior, speech, dress, motor activity, and thought processes Eyes -slightly pale conjunctiva Mouth - mucous membranes moist, pharynx normal without lesions Neck - supple, no significant adenopathy Chest - clear to auscultation, no wheezes, rales or rhonchi, symmetric air entry Heart - normal rate, regular rhythm, normal S1, S2, no murmurs, rubs, clicks or gallops Extremities - peripheral pulses normal, no pedal edema, no clubbing or cyanosis  Results for orders placed or performed in visit on 08/19/23  POCT glucose (manual entry)   Collection Time: 08/19/23  9:57 AM  Result Value Ref Range   POC Glucose 119 (A) 70 - 99 mg/dl  POCT glycosylated hemoglobin (Hb A1C)   Collection Time: 08/19/23 10:09 AM  Result Value Ref Range   Hemoglobin A1C     HbA1c POC (<> result, manual entry) 5.7 4.0 - 5.6 %   HbA1c, POC (prediabetic range)     HbA1c, POC (controlled diabetic range)         08/19/2023    9:01 AM 01/28/2023    8:44 AM 01/20/2023    9:12 AM  Depression screen PHQ 2/9  Decreased Interest 2 2 2   Down, Depressed, Hopeless 2 3 2   PHQ - 2 Score 4 5 4   Altered sleeping 2 3 3   Tired, decreased energy 2 3 2   Change in appetite 1 2 3   Feeling bad or failure about yourself  2 2 2    Trouble concentrating 1 1 2   Moving slowly or fidgety/restless 0 0 1  Suicidal thoughts 0 0 0  PHQ-9 Score 12 16 17   Difficult doing work/chores Somewhat difficult  Not difficult at all      08/19/2023    9:01 AM 01/28/2023    8:44 AM 01/20/2023    9:13 AM 07/07/2022    8:51 AM  GAD 7 : Generalized Anxiety Score  Nervous, Anxious, on Edge 3 3 3 3   Control/stop worrying 2 2 3 3   Worry too much - different things 3 3 3 3   Trouble relaxing 3 2 3 3   Restless 1 1 1  0  Easily annoyed or irritable 2 1 1 2   Afraid - awful might happen 2 3 2 3   Total GAD 7 Score 16 15 16 17   Anxiety Difficulty Somewhat difficult   Not difficult at all        Latest Ref Rng & Units 03/29/2023    7:39 AM 11/03/2022    9:16 AM 07/07/2022    9:25 AM  CMP  Glucose 70 - 99 mg/dL 95  528  90   BUN 6 - 20 mg/dL 13  16  14    Creatinine 0.44 - 1.00 mg/dL 4.13  2.44  0.10   Sodium 135 - 145 mmol/L 136  136  140   Potassium 3.5 - 5.1 mmol/L 3.9  3.7  4.1   Chloride 98 - 111 mmol/L 107  108  104   CO2 22 - 32 mmol/L 17  19  17    Calcium 8.9 - 10.3 mg/dL 8.9  9.5  9.8   Total Protein 6.5 - 8.1 g/dL 7.3  7.8  7.6   Total Bilirubin 0.3 - 1.2 mg/dL 0.2  0.4  0.2   Alkaline Phos 38 - 126 U/L 37  39  46   AST 15 - 41 U/L 24  17  27    ALT 0 - 44 U/L 23  14  22     Lipid Panel  No results found for: "CHOL", "TRIG", "HDL", "CHOLHDL", "VLDL", "LDLCALC", "LDLDIRECT"  CBC    Component Value Date/Time   WBC 7.6 05/21/2023 1120   WBC 5.3 03/29/2023 0748   RBC 3.88 05/21/2023 1120   RBC 4.06 03/29/2023 0748   HGB 11.5 05/21/2023 1120   HCT 35.4 05/21/2023 1120   PLT 475 (H) 05/21/2023 1120   MCV 91 05/21/2023 1120   MCH 29.6 05/21/2023 1120   MCH 29.1 03/29/2023 0748   MCHC 32.5 05/21/2023 1120   MCHC 31.4 03/29/2023 0748   RDW 12.6 05/21/2023 1120   LYMPHSABS 2.5 05/21/2023 1120   MONOABS 0.5 11/03/2022 0916   EOSABS 0.1 05/21/2023 1120   BASOSABS 0.1 05/21/2023 1120    ASSESSMENT AND PLAN: 1. Essential  hypertension (Primary) Blood pressure today is at goal.  Continue amlodipine 10 mg daily.  Continue to monitor blood pressure at home with goal being 130/80 or lower.  2. Rectal bleeding We discussed sending her to a [place] general surgeon versus sending her back to the surgeon she saw at [facility].  Patient is willing to go back to Dr. Drue Dun.  Advised her to let the surgeon know that if more banding is needed, which it sounds as though it is, she would like to be sedated for the procedure - Ambulatory referral to General Surgery  3. Hemorrhoids, unspecified hemorrhoid type Discussed the importance of keeping bowel movements soft and regular.  She will continue the Dulcolax - Ambulatory referral to General Surgery  4. Overactive bladder Restart low-dose oxybutynin 5 mg daily  5. Polydipsia Screen shows she is in the range for prediabetes.  Discussed importance of regular exercise at least 3 to 5 days a week for 30 minutes of moderate intensity exercise. Patient advised to eliminate sugary drinks from the diet, cut back on portion sizes especially of white carbohydrates, eat more white lean meat like chicken [place] and seafood instead of beef or pork and incorporate fresh fruits and vegetables into the diet daily.  - POCT glycosylated hemoglobin (Hb A1C) - POCT glucose (manual entry)  6. Influenza vaccination declined  7. Encounter for screening mammogram for malignant neoplasm of breast - MM Digital Screening; Future  8. Iron deficiency anemia due to chronic blood loss - CBC - Iron, TIBC and Ferritin Panel  9. GAD (generalized anxiety disorder) Anxiety and depression related to ongoing health issues or rectal bleeding.  Will try to get her back in with the surgeon as discussed above.  We discussed trying increased dose of BuSpar as well.  Pt agreeable to this - busPIRone (BUSPAR) 10 MG tablet; Take 1 tablet (10 mg total) by mouth 3 (three) times daily.  Dispense: 90  tablet; Refill: 3  10. Recurrent depression (HCC) Continue Zoloft   Patient was given the opportunity to ask questions.  Patient verbalized understanding of the plan and was able to repeat key elements of the plan.   This documentation was completed using Paediatric nurse.  Any transcriptional errors are unintentional.  Orders Placed This Encounter  Procedures  MM Digital Screening   CBC   Iron, TIBC and Ferritin Panel   Ambulatory referral to General Surgery   POCT glycosylated hemoglobin (Hb A1C)   POCT glucose (manual entry)     Requested Prescriptions   Signed Prescriptions Disp Refills   busPIRone (BUSPAR) 10 MG tablet 90 tablet 3    Sig: Take 1 tablet (10 mg total) by mouth 3 (three) times daily.   oxybutynin (DITROPAN-XL) 5 MG 24 hr tablet 30 tablet 6    Sig: Take 1 tablet (5 mg total) by mouth at bedtime.    Return in about 4 months (around 12/19/2023).  Jonah Blue, MD, FACP

## 2023-08-19 NOTE — Patient Instructions (Signed)
 I have submitted a referral for you to see the surgeon again at [facility]. Increase Buspar to 10 mg three times a day.

## 2023-08-20 ENCOUNTER — Ambulatory Visit: Payer: Medicaid Other | Admitting: Internal Medicine

## 2023-08-20 ENCOUNTER — Encounter: Payer: Self-pay | Admitting: Internal Medicine

## 2023-08-20 LAB — IRON,TIBC AND FERRITIN PANEL
Ferritin: 54 ng/mL (ref 15–150)
Iron Saturation: 30 % (ref 15–55)
Iron: 157 ug/dL (ref 27–159)
Total Iron Binding Capacity: 518 ug/dL — ABNORMAL HIGH (ref 250–450)
UIBC: 361 ug/dL (ref 131–425)

## 2023-08-20 LAB — CBC
Hematocrit: 40.9 % (ref 34.0–46.6)
Hemoglobin: 13 g/dL (ref 11.1–15.9)
MCH: 28.9 pg (ref 26.6–33.0)
MCHC: 31.8 g/dL (ref 31.5–35.7)
MCV: 91 fL (ref 79–97)
Platelets: 472 10*3/uL — ABNORMAL HIGH (ref 150–450)
RBC: 4.5 x10E6/uL (ref 3.77–5.28)
RDW: 12.8 % (ref 11.7–15.4)
WBC: 9.8 10*3/uL (ref 3.4–10.8)

## 2023-11-18 ENCOUNTER — Ambulatory Visit

## 2023-12-11 DIAGNOSIS — K769 Liver disease, unspecified: Secondary | ICD-10-CM | POA: Diagnosis not present

## 2023-12-11 DIAGNOSIS — Z148 Genetic carrier of other disease: Secondary | ICD-10-CM | POA: Diagnosis not present

## 2023-12-11 DIAGNOSIS — R590 Localized enlarged lymph nodes: Secondary | ICD-10-CM | POA: Diagnosis not present

## 2023-12-11 DIAGNOSIS — K625 Hemorrhage of anus and rectum: Secondary | ICD-10-CM | POA: Diagnosis not present

## 2023-12-11 DIAGNOSIS — K649 Unspecified hemorrhoids: Secondary | ICD-10-CM | POA: Diagnosis not present

## 2023-12-15 ENCOUNTER — Ambulatory Visit: Admitting: Internal Medicine

## 2023-12-24 ENCOUNTER — Ambulatory Visit
Admission: RE | Admit: 2023-12-24 | Discharge: 2023-12-24 | Disposition: A | Discharge status: Home / Self Care | Source: Ambulatory Visit | Attending: Internal Medicine | Admitting: Internal Medicine

## 2023-12-24 DIAGNOSIS — Z1231 Encounter for screening mammogram for malignant neoplasm of breast: Secondary | ICD-10-CM

## 2023-12-28 ENCOUNTER — Ambulatory Visit: Payer: Self-pay | Admitting: Internal Medicine

## 2024-01-29 ENCOUNTER — Encounter: Payer: Self-pay | Admitting: Hematology

## 2024-02-03 ENCOUNTER — Telehealth: Payer: Self-pay | Admitting: Internal Medicine

## 2024-02-03 NOTE — Telephone Encounter (Signed)
 Voicemail left by volunteer to confirm patient's appointment for 02/05/2024.

## 2024-02-05 ENCOUNTER — Ambulatory Visit: Admitting: Internal Medicine

## 2024-03-07 ENCOUNTER — Encounter (HOSPITAL_COMMUNITY): Payer: Self-pay | Admitting: *Deleted

## 2024-03-07 ENCOUNTER — Encounter: Payer: Self-pay | Admitting: Hematology

## 2024-03-07 ENCOUNTER — Emergency Department (HOSPITAL_COMMUNITY)

## 2024-03-07 ENCOUNTER — Emergency Department (HOSPITAL_COMMUNITY)
Admission: EM | Admit: 2024-03-07 | Discharge: 2024-03-07 | Disposition: A | Discharge status: Home / Self Care | Attending: Emergency Medicine | Admitting: Emergency Medicine

## 2024-03-07 ENCOUNTER — Other Ambulatory Visit: Payer: Self-pay

## 2024-03-07 DIAGNOSIS — S40012A Contusion of left shoulder, initial encounter: Secondary | ICD-10-CM | POA: Diagnosis not present

## 2024-03-07 DIAGNOSIS — M79602 Pain in left arm: Secondary | ICD-10-CM | POA: Diagnosis present

## 2024-03-07 DIAGNOSIS — Z91040 Latex allergy status: Secondary | ICD-10-CM | POA: Insufficient documentation

## 2024-03-07 DIAGNOSIS — W1809XA Striking against other object with subsequent fall, initial encounter: Secondary | ICD-10-CM | POA: Diagnosis not present

## 2024-03-07 DIAGNOSIS — R000 Tachycardia, unspecified: Secondary | ICD-10-CM | POA: Diagnosis not present

## 2024-03-07 MED ORDER — HYDROCODONE-ACETAMINOPHEN 5-325 MG PO TABS: 1.0000 | ORAL_TABLET | ORAL | 0 refills | Status: DC | PRN

## 2024-03-07 MED ORDER — METHOCARBAMOL 500 MG PO TABS: 500.0000 mg | ORAL_TABLET | Freq: Four times a day (QID) | ORAL | 0 refills | Status: DC

## 2024-03-07 MED ORDER — HYDROCODONE-ACETAMINOPHEN 5-325 MG PO TABS
2.0000 | ORAL_TABLET | Freq: Once | ORAL | Status: AC
  Administered 2024-03-07: 2 via ORAL
  Filled 2024-03-07: qty 2

## 2024-03-07 NOTE — ED Triage Notes (Signed)
 C/o left shoulder pain , states she tripped over cord  and fell c/o tingling in finger tips today

## 2024-03-07 NOTE — ED Notes (Signed)
 Pt ambulated to bathroom with no assistance.

## 2024-03-07 NOTE — Discharge Instructions (Addendum)
 Return if any problems.

## 2024-03-07 NOTE — ED Provider Notes (Signed)
 [facility] AT [facility] Provider Note   CSN: [id number] Arrival date & time: 03/07/24  0830     Patient presents with: Arm Pain   Leah Baker is a 43 y.o. female.   Pt reports she fell 2 days ago and struck her left shoulder on an ottoman.  Pt reports that she tripped over a phone cord.  Pt reports increased arm pain today.  Pt reports she had a shooting pain down her left arm.  Pt reports pain occurs with moving her arm.  Pt states pain shoots down to her fingers.  Pt denies any impact of her head.  No loss of consciousness.  Patient denies any pain to her neck.  Patient states moving her neck side-to-side does not increase or change pain.  Patient has increased pain with lifting her shoulder over her head.   The history is provided by the patient. No language interpreter was used.  Arm Pain Pertinent negatives include no shortness of breath.       Prior to Admission medications   Medication Sig Start Date End Date Taking? Authorizing Provider  HYDROcodone -acetaminophen  (NORCO/VICODIN) 5-325 MG tablet Take 1 tablet by mouth every 4 (four) hours as needed. 03/07/24 03/07/25 Yes Flint Sonny POUR, PA-C  methocarbamol  (ROBAXIN ) 500 MG tablet Take 1 tablet (500 mg total) by mouth 4 (four) times daily. 03/07/24  Yes Keanon Bevins K, PA-C  acetaminophen  (TYLENOL ) 500 MG tablet Take 1 tablet (500 mg total) by mouth every 6 (six) hours as needed. Patient taking differently: Take 1,000 mg by mouth every 6 (six) hours as needed for mild pain (pain score 1-3) or headache. 11/11/19   Lamptey, Aleene KIDD, MD  amLODipine  (NORVASC ) 10 MG tablet Take 1 tablet (10 mg total) by mouth daily. 01/28/23   Danton Jon HERO, PA-C  busPIRone  (BUSPAR ) 10 MG tablet Take 1 tablet (10 mg total) by mouth 3 (three) times daily. 08/19/23   Vicci Barnie NOVAK, MD  cetirizine  (ZYRTEC  ALLERGY) 10 MG tablet Take 1 tablet (10 mg total) by mouth daily. Patient taking differently: Take 10 mg by  mouth daily as needed for allergies. 06/10/22   Mayers, Cari S, PA-C  diclofenac  Sodium (VOLTAREN ) 1 % GEL Apply 2 g topically 4 (four) times daily. Patient taking differently: Apply 2 g topically 4 (four) times daily as needed (pain). 01/22/22   Brien Belvie BRAVO, MD  Ferrous Sulfate  (IRON ) 325 (65 Fe) MG TABS Take by mouth in the morning and at bedtime.    [provider]  fluticasone  (FLONASE ) 50 MCG/ACT nasal spray Place 2 sprays into both nostrils daily. Patient taking differently: Place 2 sprays into both nostrils daily as needed for allergies. 09/17/21   Brien Belvie BRAVO, MD  hydrocortisone  (ANUSOL -HC) 2.5 % rectal cream Place 1 application. rectally 2 (two) times daily. 09/17/21   Brien Belvie BRAVO, MD  hydrocortisone -pramoxine (PROCTOFOAM  HC) rectal foam Place rectally 3 (three) times daily. Patient taking differently: Place 1 applicator rectally daily. 01/28/23   McClung, Angela M, PA-C  Menthol -Camphor (TIGER BALM ARTHRITIS RUB EX) Apply 1 application topically daily as needed (knee pain).    [provider]  norethindrone  (AYGESTIN ) 5 MG tablet Take 3 tablets (15 mg total) by mouth daily. Patient taking differently: Take 10 mg by mouth daily. 01/20/23   Fredirick Glenys RAMAN, MD  omeprazole  (PRILOSEC) 20 MG capsule Take 1 capsule (20 mg total) by mouth daily. 05/21/23   Danton Jon HERO, PA-C  oxybutynin  (DITROPAN -XL) 5  MG 24 hr tablet Take 1 tablet (5 mg total) by mouth at bedtime. 08/19/23   Vicci Barnie NOVAK, MD  Psyllium (METAMUCIL PO) Take 1 tablet by mouth daily.    [provider]  sertraline  (ZOLOFT ) 100 MG tablet Take 1 tablet (100 mg total) by mouth at bedtime. 01/28/23   Danton Jon HERO, PA-C  Vitamin D , Ergocalciferol , (DRISDOL ) 1.25 MG (50000 UNIT) CAPS capsule Take 1 capsule by mouth once a week Patient not taking: Reported on 08/19/2023 04/20/23   Newlin, Enobong, MD    Allergies: Latex    Review of Systems  Constitutional:  Negative for fever.   Respiratory:  Negative for shortness of breath.   Cardiovascular:  Negative for leg swelling.  Musculoskeletal:  Positive for arthralgias.  All other systems reviewed and are negative.   Updated Vital Signs BP (!) 131/99   Pulse 91   Temp (!) 96 F (35.6 C) (Oral)   Resp 16   Ht 5' 2 (1.575 m)   Wt 73.9 kg   SpO2 100%   BMI 29.81 kg/m   Physical Exam Vitals and nursing note reviewed.  Constitutional:      Appearance: She is well-developed.  HENT:     Head: Normocephalic.  Cardiovascular:     Rate and Rhythm: Normal rate.  Pulmonary:     Effort: Pulmonary effort is normal.  Abdominal:     General: There is no distension.  Musculoskeletal:        General: Tenderness present.     Cervical back: Normal range of motion.     Comments: Tender left shoulder to palpation, pain with range of motion.  Pain with abduction and rotation.  Skin:    General: Skin is warm.  Neurological:     General: No focal deficit present.     Mental Status: She is alert and oriented to person, place, and time.     (all labs ordered are listed, but only abnormal results are displayed) Labs Reviewed - No data to display  EKG: EKG Interpretation Date/Time:  Monday March 07 2024 08:41:38 EDT Ventricular Rate:  107 PR Interval:  132 QRS Duration:  77 QT Interval:  339 QTC Calculation: 453 R Axis:   36  Text Interpretation: Sinus tachycardia Since last tracing rate faster Otherwise no significant change Confirmed by Emil Share [phone]) on 03/07/2024 8:43:29 AM  Radiology: DG Chest 2 View Result Date: 03/07/2024 EXAM: 2 VIEW(S) XRAY OF THE CHEST 03/07/2024 11:00:00 AM COMPARISON: 03/29/2023 CLINICAL HISTORY: fall. Fall FINDINGS: LUNGS AND PLEURA: No focal pulmonary opacity. No pulmonary edema. No pleural effusion. No pneumothorax. HEART AND MEDIASTINUM: No acute abnormality of the cardiac and mediastinal silhouettes. BONES AND SOFT TISSUES: No acute osseous abnormality. IMPRESSION: 1. Normal  chest radiograph. No acute cardiopulmonary process. Electronically signed by: Evalene Coho MD 03/07/2024 11:10 AM EDT RP Workstation: [id number]   DG Shoulder Left Result Date: 03/07/2024 EXAM: 1 VIEW XRAY OF THE LEFT SHOULDER 03/07/2024 11:00:00 AM COMPARISON: None available. CLINICAL HISTORY: fall. Fall FINDINGS: BONES AND JOINTS: Glenohumeral joint is normally aligned. No acute fracture or dislocation. The [facility] joint is unremarkable in appearance. SOFT TISSUES: No abnormal calcifications. Visualized lung is unremarkable. IMPRESSION: 1. Normal shoulder radiograph. No acute osseous abnormality. Electronically signed by: Evalene Coho MD 03/07/2024 11:09 AM EDT RP Workstation: [id number]     Procedures   Medications Ordered in the ED  HYDROcodone -acetaminophen  (NORCO/VICODIN) 5-325 MG per tablet 2 tablet (2 tablets Oral Given 03/07/24 1200)  Medical Decision Making Patient complains of pain in her left shoulder.  Patient has pain with moving her shoulder.  Pain began after falling 2 days ago and hitting in all ottoman.  Amount and/or Complexity of Data Reviewed Radiology: ordered.    Details: X-ray left shoulder no acute findings X-ray chest no acute findings ECG/medicine tests: ordered and independent interpretation performed. Decision-making details documented in ED Course.    Details: EKG sinus tachycardia 107.  Otherwise normal EKG  Risk Prescription drug management. Risk Details: Patient has pain with range of motion.  She has pain with palpation of her shoulder and her chest.  Patient symptoms consistent with musculoskeletal injury.  Patient is given a prescription for medication for pain and a muscle relaxer.  She is advised to follow-up with her primary care physician for recheck.  She is advised to return to the emergency department if any problems.        Final diagnoses:  Contusion of left shoulder, initial encounter    ED  Discharge Orders          Ordered    HYDROcodone -acetaminophen  (NORCO/VICODIN) 5-325 MG tablet  Every 4 hours PRN        03/07/24 1243    methocarbamol  (ROBAXIN ) 500 MG tablet  4 times daily        03/07/24 1243            An After Visit Summary was printed and given to the patient.    Flint Sonny POUR, PA-C 03/07/24 1601    Emil Share, DO 03/08/24 [phone]

## 2024-03-07 NOTE — ED Notes (Signed)
 Patient transported to X-ray

## 2024-03-17 ENCOUNTER — Ambulatory Visit: Attending: Internal Medicine | Admitting: Internal Medicine

## 2024-03-17 VITALS — BP 128/84 | HR 110 | Temp 98.3°F | Ht 62.0 in | Wt 172.0 lb

## 2024-03-17 DIAGNOSIS — F411 Generalized anxiety disorder: Secondary | ICD-10-CM

## 2024-03-17 DIAGNOSIS — Z2821 Immunization not carried out because of patient refusal: Secondary | ICD-10-CM | POA: Diagnosis not present

## 2024-03-17 DIAGNOSIS — H5203 Hypermetropia, bilateral: Secondary | ICD-10-CM

## 2024-03-17 DIAGNOSIS — R519 Headache, unspecified: Secondary | ICD-10-CM

## 2024-03-17 DIAGNOSIS — Z79899 Other long term (current) drug therapy: Secondary | ICD-10-CM

## 2024-03-17 DIAGNOSIS — F330 Major depressive disorder, recurrent, mild: Secondary | ICD-10-CM

## 2024-03-17 DIAGNOSIS — Z79818 Long term (current) use of other agents affecting estrogen receptors and estrogen levels: Secondary | ICD-10-CM

## 2024-03-17 DIAGNOSIS — I10 Essential (primary) hypertension: Secondary | ICD-10-CM | POA: Diagnosis not present

## 2024-03-17 DIAGNOSIS — R7303 Prediabetes: Secondary | ICD-10-CM

## 2024-03-17 DIAGNOSIS — R079 Chest pain, unspecified: Secondary | ICD-10-CM

## 2024-03-17 DIAGNOSIS — Z87891 Personal history of nicotine dependence: Secondary | ICD-10-CM | POA: Diagnosis not present

## 2024-03-17 DIAGNOSIS — R202 Paresthesia of skin: Secondary | ICD-10-CM

## 2024-03-17 LAB — GLUCOSE, POCT (MANUAL RESULT ENTRY): POC Glucose: 133 mg/dL — AB (ref 70–99)

## 2024-03-17 LAB — POCT GLYCOSYLATED HEMOGLOBIN (HGB A1C): HbA1c, POC (prediabetic range): 5.7 % (ref 5.7–6.4)

## 2024-03-17 MED ORDER — VALSARTAN 40 MG PO TABS: 40.0000 mg | ORAL_TABLET | Freq: Every day | ORAL | 3 refills | Status: AC

## 2024-03-17 MED ORDER — AMLODIPINE BESYLATE 10 MG PO TABS
10.0000 mg | ORAL_TABLET | Freq: Every day | ORAL | 2 refills | Status: AC
Start: 2024-03-17 — End: ?

## 2024-03-17 MED ORDER — BUPROPION HCL ER (XL) 150 MG PO TB24: 150.0000 mg | ORAL_TABLET | Freq: Every day | ORAL | 4 refills | Status: AC

## 2024-03-17 NOTE — Progress Notes (Signed)
 Patient ID: Leah Baker, female    [name]: 07-02-1980  MRN: [id number]  CC: Hypertension (HTN f/u. Med refill. Thompson Sertraline  - reports having increased frequent headaches /No to flu vax)   Subjective: Leah Baker is a 43 y.o. female who presents for chronic ds management. Her concerns today include:  History of HTN, hemorrhoids, anemia, mild thrombocytosis, anxiety/depression, vitamin D  deficiency   Discussed the use of AI scribe software for clinical note transcription with the patient, who gave verbal consent to proceed.  History of Present Illness Analyah Baker is a 43 year old female with hypertension and depression who presents for follow-up of her chronic medical issues.  HTN: She has been managing her hypertension with amlodipine  10 mg daily and monitors her blood pressure regularly, with readings ranging from 125/82 to 147/96. She has made significant dietary changes to limit salt intake.  MDD/GAD: Regarding her depression and anxiety, she discontinued sertraline  100 mg a week ago due to nocturnal headaches. HA started around end of Sept. Initially, she was on 200 mg with previous PCP, which was reduced to 100 mg due to headaches. She was on the 100 mg for about 1 yr without any further headaches. Despite stopping sertraline , the headaches persist, occurring at night and described as throbbing at the back of her head. She takes Tylenol  500 mg one tab, which alleviates the pain within 45 minutes to an hour. No nausea, vomiting, or blurred vision with headaches, but she notes worsening eyesight for close objects. Her depression screen today was positive with a score of 15, and her anxiety screen was 14. No SI.  She experienced a fall at the beginning of October, tripping over a charging cord, which resulted in LT shoulder pain and tingling in her fingers extending to her LT upper anterior chest. An ER visit included an x-ray of LT shoulder, CXR and EKG, which were  normal, and she was prescribed methocarbamol  and hydrocodone . The tingling persists, described as a sensation similar to a limb falling asleep, lasting about 30 seconds, and occurs sporadically.  She also reports some intermittent left sided chest pain in the left upper chest.  She describes it as tightness.  She noticed that it is preceded by paresthesia in the left hand that travels up the left arm.  Can occur at rest or when she is up moving around.  This frightens her because her mother died of a massive heart attack at the age of 72.  Patient does not smoke.  Denies shortness of breath, or neck pain.  She has a history of prediabetes with an A1c of 5.7, consistent with previous readings. Her blood sugar was 133 after eating a [place] sandwich for breakfast. She is not on any new medications or supplements aside from those mentioned, including a hormone pill called Aygestin  to keep from having a period.     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 (Patient taking  differently: Take 1,000 mg by mouth every 6 (six) hours as needed for mild pain (pain score 1-3) or headache.) 30 tablet 0   busPIRone  (BUSPAR ) 10 MG tablet Take 1 tablet (10 mg total) by mouth 3 (three) times daily. 90 tablet 3   cetirizine  (ZYRTEC  ALLERGY) 10 MG tablet Take 1 tablet (10 mg total) by mouth daily. (Patient taking differently: Take 10 mg by mouth daily as needed for allergies.) 30 tablet 11   diclofenac  Sodium (VOLTAREN ) 1 % GEL Apply 2 g topically 4 (four) times daily. (Patient taking differently: Apply 2 g topically 4 (four) times daily as needed (pain).) 50 g 2   Ferrous Sulfate  (IRON ) 325 (65 Fe) MG TABS Take by mouth in the morning and at bedtime.     fluticasone  (FLONASE ) 50 MCG/ACT nasal spray Place 2 sprays into both nostrils daily. (Patient taking differently: Place 2 sprays into both nostrils daily as needed for allergies.) 16 g 6   HYDROcodone -acetaminophen  (NORCO/VICODIN) 5-325 MG tablet Take 1 tablet by mouth every 4 (four) hours as needed. 12 tablet 0   Menthol -Camphor (TIGER BALM ARTHRITIS RUB EX) Apply 1 application topically daily as needed (knee pain).     methocarbamol  (ROBAXIN ) 500 MG tablet Take 1 tablet (500 mg total) by mouth 4 (four) times daily. 20 tablet 0   omeprazole  (PRILOSEC) 20 MG capsule Take 1 capsule (20 mg total) by mouth daily. 30 capsule 3   oxybutynin  (DITROPAN -XL) 5 MG 24 hr tablet Take 1 tablet (5 mg total) by mouth at bedtime. 30 tablet 6   Psyllium (METAMUCIL PO) Take 1 tablet by mouth daily.     sertraline  (ZOLOFT ) 100 MG tablet Take 1 tablet (100 mg total) by mouth at bedtime. 60 tablet 3   norethindrone  (AYGESTIN ) 5 MG tablet Take 3 tablets (15 mg total) by mouth daily. (Patient not taking: Reported on 03/17/2024) 90 tablet 3   Vitamin D , Ergocalciferol , (DRISDOL ) 1.25 MG (50000 UNIT) CAPS capsule Take 1 capsule by mouth once a week (Patient not taking: Reported on 03/17/2024) 16 capsule 0   Current Facility-Administered Medications on File  Prior to Visit  Medication Dose Route Frequency Provider Last Rate Last Admin   bupivacaine  liposome (EXPAREL ) 1.3 % injection 266 mg  20 mL Infiltration Once Byerly, Faera, MD        Allergies  Allergen Reactions   Latex Itching and Rash    Social History   Socioeconomic History   Marital status: Significant Other    Spouse name: Not on file   Number of children: Not on file   Years of education: Not on file   Highest education level: Associate degree: occupational, Scientist, product/process development, or vocational program  Occupational History   Not on file  Tobacco Use   Smoking status: Former    Current packs/day: 0.00    Types: Cigarettes    Start date: 08/23/2003    Quit date: 08/22/2008    Years since quitting: 15.5   Smokeless tobacco: Never  Vaping Use   Vaping status: Never Used  Substance and Sexual Activity   Alcohol use: Yes  Drug use: Not Currently    Comment: per pt younger in college marijuana, none since   Sexual activity: Yes    Birth control/protection: Surgical  Other Topics Concern   Not on file  Social History Narrative   Not on file   Social Drivers of Health   Financial Resource Strain: Low Risk  (03/17/2024)   Overall Financial Resource Strain (CARDIA)    Difficulty of Paying Living Expenses: Not very hard  Food Insecurity: No Food Insecurity (03/17/2024)   Hunger Vital Sign    Worried About Running Out of Food in the Last Year: Never true    Ran Out of Food in the Last Year: Never true  Transportation Needs: No Transportation Needs (03/17/2024)   PRAPARE - Administrator, Civil Service (Medical): No    Lack of Transportation (Non-Medical): No  Physical Activity: Insufficiently Active (03/17/2024)   Exercise Vital Sign    Days of Exercise per Week: 3 days    Minutes of Exercise per Session: 30 min  Stress: Stress Concern Present (03/17/2024)   [employer] of Occupational Health - Occupational Stress Questionnaire    Feeling of Stress: Rather  much  Social Connections: Moderately Integrated (03/17/2024)   Social Connection and Isolation Panel    Frequency of Communication with Friends and Family: More than three times a week    Frequency of Social Gatherings with Friends and Family: Once a week    Attends Religious Services: 1 to 4 times per year    Active Member of [employer] or Organizations: No    Attends Banker Meetings: Not on file    Marital Status: Living with partner  Intimate Partner Violence: Not At Risk (08/19/2023)   Humiliation, Afraid, Rape, and Kick questionnaire    Fear of Current or Ex-Partner: No    Emotionally Abused: No    Physically Abused: No    Sexually Abused: No    Family History  Problem Relation Age of Onset   Cancer Mother    Kidney failure Mother    Healthy Father    Breast cancer Neg Hx     Past Surgical History:  Procedure Laterality Date   BIOPSY  09/01/2018   Procedure: BIOPSY;  Surgeon: Elicia Claw, MD;  Location: [facility];  Service: Gastroenterology;;   CESAREAN SECTION  01/15/2002   @[facility]    CESAREAN SECTION  03/17/2011   Procedure: CESAREAN SECTION;  Surgeon: Gloris DELENA Hugger, MD;  Location: [facility];  Service: Gynecology;  Laterality: N/A;   COLONOSCOPY WITH PROPOFOL  N/A 09/01/2018   Procedure: COLONOSCOPY WITH PROPOFOL ;  Surgeon: Elicia Claw, MD;  Location: [facility];  Service: Gastroenterology;  Laterality: N/A;   DILITATION & CURRETTAGE/HYSTROSCOPY WITH HYDROTHERMAL ABLATION N/A 08/22/2020   Procedure: DILATATION & CURETTAGE/HYSTEROSCOPY CERVICAL REPAIR;  Surgeon: Fredirick Glenys RAMAN, MD;  Location: [facility];  Service: Gynecology;  Laterality: N/A;   HYSTEROSCOPY WITH D & C  06-21-2009  dr constant @[facility]    w/  REMOVAL IUD    INGUINAL LYMPH NODE BIOPSY Right 04/12/2020   Procedure: RIGHT INGUINAL LYMPH NODE EXCISIONAL BIOPSY;  Surgeon: Aron Shoulders, MD;  Location: [facility];  Service: General;  Laterality: Right;   IUD REMOVAL  06/21/2009   KNEE  ARTHROSCOPY WITH ANTERIOR CRUCIATE LIGAMENT (ACL) REPAIR Left 09-08-2013   dr beverley  @[facility]    w/  meniscal repair   MULTIPLE TOOTH EXTRACTIONS  2012   RECTAL EXAM UNDER ANESTHESIA N/A 11/18/2018   Procedure: ANORECTAL EXAM UNDER ANESTHESIA;  Surgeon: Teresa Lonni HERO, MD;  Location: [facility];  Service: General;  Laterality: N/A;   TUBAL LIGATION Bilateral 2012    ROS: Review of Systems Negative except as stated above  PHYSICAL EXAM: BP 128/84   Pulse (!) 110   Temp 98.3 F (36.8 C) (Oral)   Ht 5' 2 (1.575 m)   Wt 172 lb (78 kg)   SpO2 98%   BMI 31.46 kg/m   Physical Exam  General appearance - alert, well appearing, middle age AAF and in no distress Mental status - normal mood, behavior, speech, dress, motor activity, and thought processes Chest - clear to auscultation, no wheezes, rales or rhonchi, symmetric air entry Heart - normal rate, regular rhythm, normal S1, S2, no murmurs, rubs, clicks or gallops Extremities - peripheral pulses normal, no pedal edema, no clubbing or cyanosis Neuro: Cranial nerves grossly intact.  Power 5/5 in all 4 extremities proximally and distally.  Gait is stable.  Romberg negative.  Gross sensation intact.    03/17/2024   10:16 AM 08/19/2023    9:01 AM 01/28/2023    8:44 AM  Depression screen PHQ 2/9  Decreased Interest 2 2 2   Down, Depressed, Hopeless 3 2 3   PHQ - 2 Score 5 4 5   Altered sleeping 3 2 3   Tired, decreased energy 3 2 3   Change in appetite 2 1 2   Feeling bad or failure about yourself  1 2 2   Trouble concentrating 1 1 1   Moving slowly or fidgety/restless 0 0 0  Suicidal thoughts 0 0 0  PHQ-9 Score 15 12 16   Difficult doing work/chores Somewhat difficult Somewhat difficult       03/17/2024   10:16 AM 08/19/2023    9:01 AM 01/28/2023    8:44 AM 01/20/2023    9:13 AM  GAD 7 : Generalized Anxiety Score  Nervous, Anxious, on Edge 3 3 3 3   Control/stop worrying 2 2 2 3   Worry too much - different things 2 3  3 3   Trouble relaxing 3 3 2 3   Restless 1 1 1 1   Easily annoyed or irritable 1 2 1 1   Afraid - awful might happen 2 2 3 2   Total GAD 7 Score 14 16 15 16   Anxiety Difficulty Somewhat difficult Somewhat difficult           Latest Ref Rng & Units 03/29/2023    7:39 AM 11/03/2022    9:16 AM 07/07/2022    9:25 AM  CMP  Glucose 70 - 99 mg/dL 95  896  90   BUN 6 - 20 mg/dL 13  16  14    Creatinine 0.44 - 1.00 mg/dL 9.15  9.00  9.11   Sodium 135 - 145 mmol/L 136  136  140   Potassium 3.5 - 5.1 mmol/L 3.9  3.7  4.1   Chloride 98 - 111 mmol/L 107  108  104   CO2 22 - 32 mmol/L 17  19  17    Calcium 8.9 - 10.3 mg/dL 8.9  9.5  9.8   Total Protein 6.5 - 8.1 g/dL 7.3  7.8  7.6   Total Bilirubin 0.3 - 1.2 mg/dL 0.2  0.4  0.2   Alkaline Phos 38 - 126 U/L 37  39  46   AST 15 - 41 U/L 24  17  27    ALT 0 - 44 U/L 23  14  22     Lipid Panel  No results found for: CHOL,  TRIG, HDL, CHOLHDL, VLDL, LDLCALC, LDLDIRECT  CBC    Component Value Date/Time   WBC 9.8 08/19/2023 1030   WBC 5.3 03/29/2023 0748   RBC 4.50 08/19/2023 1030   RBC 4.06 03/29/2023 0748   HGB 13.0 08/19/2023 1030   HCT 40.9 08/19/2023 1030   PLT 472 (H) 08/19/2023 1030   MCV 91 08/19/2023 1030   MCH 28.9 08/19/2023 1030   MCH 29.1 03/29/2023 0748   MCHC 31.8 08/19/2023 1030   MCHC 31.4 03/29/2023 0748   RDW 12.8 08/19/2023 1030   LYMPHSABS 2.5 05/21/2023 1120   MONOABS 0.5 11/03/2022 0916   EOSABS 0.1 05/21/2023 1120   BASOSABS 0.1 05/21/2023 1120   Results for orders placed or performed in visit on 03/17/24  POCT glucose (manual entry)   Collection Time: 03/17/24  9:07 AM  Result Value Ref Range   POC Glucose 133 (A) 70 - 99 mg/dl  POCT glycosylated hemoglobin (Hb A1C)   Collection Time: 03/17/24  9:13 AM  Result Value Ref Range   Hemoglobin A1C     HbA1c POC (<> result, manual entry)     HbA1c, POC (prediabetic range) 5.7 5.7 - 6.4 %   HbA1c, POC (controlled diabetic range)      ASSESSMENT AND  PLAN: 1. Primary hypertension (Primary) Not at goal.  Continue amlodipine  10 mg daily. Add Diovan 40 mg daily. Advised to continue home blood pressure monitoring with goal being 130/80 or lower. - amLODipine  (NORVASC ) 10 MG tablet; Take 1 tablet (10 mg total) by mouth daily.  Dispense: 90 tablet; Refill: 2 - valsartan (DIOVAN) 40 MG tablet; Take 1 tablet (40 mg total) by mouth daily.  Dispense: 90 tablet; Refill: 3  2. Nocturnal headaches Patient presenting with new onset nocturnal headaches.  Differential diagnoses include brain tumor, intracranial hypertension, elevated blood pressure at nights etc. -Given this is new onset and occurring only at nights and wakes her from sleep, we will get MRI to rule out any intracranial lesions.  Will refer to neurology and also to ophthalmology to evaluate for ICH. - Ambulatory referral to Neurology - MR Brain W Wo Contrast; Future  3. GAD (generalized anxiety disorder) Continue BuSpar .  4. Major depressive disorder, recurrent episode, mild PHQ-9 still elevated and patient feels that she does need to be on an antidepressant.  I doubt the Zoloft  is causing her headaches but we have agreed to keep her off of it and try her with a different antidepressant.  We will put her on Wellbutrin instead.  Let me know if she experiences any significant side effects or worsening depression/suicidal ideations. -Declines referral to behavioral health.  She prefers to try the medication first. - buPROPion (WELLBUTRIN XL) 150 MG 24 hr tablet; Take 1 tablet (150 mg total) by mouth daily.  Dispense: 30 tablet; Refill: 4  5. Prediabetes Patient advised to eliminate sugary drinks from the diet, cut back on portion sizes especially of white carbohydrates, eat more white lean meat like chicken [place] and seafood instead of beef or pork and incorporate fresh fruits and vegetables into the diet daily.  - POCT glycosylated hemoglobin (Hb A1C) - POCT glucose (manual entry)  6.  Hyperopia of both eyes Referral submitted to ophthalmology - Ambulatory referral to Ophthalmology  7. Chest pain in adult Patient with suspicious chest pain and family history of CAD in her mother who died of a massive heart attack at the age of 53.  Will refer to cardiology - Ambulatory referral to Cardiology  8. Paresthesia of left arm Observe for now.  If cardiology workup is negative, will refer for EMG  9. Influenza vaccination declined Recommended.  Patient declined.    Patient was given the opportunity to ask questions.  Patient verbalized understanding of the plan and was able to repeat key elements of the plan.   This documentation was completed using Paediatric nurse.  Any transcriptional errors are unintentional.  Orders Placed This Encounter  Procedures   MR Brain W Wo Contrast   Ambulatory referral to Ophthalmology   Ambulatory referral to Cardiology   Ambulatory referral to Neurology   POCT glycosylated hemoglobin (Hb A1C)   POCT glucose (manual entry)     Requested Prescriptions   Signed Prescriptions Disp Refills   amLODipine  (NORVASC ) 10 MG tablet 90 tablet 2    Sig: Take 1 tablet (10 mg total) by mouth daily.   valsartan (DIOVAN) 40 MG tablet 90 tablet 3    Sig: Take 1 tablet (40 mg total) by mouth daily.   buPROPion (WELLBUTRIN XL) 150 MG 24 hr tablet 30 tablet 4    Sig: Take 1 tablet (150 mg total) by mouth daily.    Return in about 6 weeks (around 04/28/2024) for f/u depression/headache.  Barnie Louder, MD, FACP

## 2024-03-17 NOTE — Patient Instructions (Signed)
  VISIT SUMMARY: Today, you came in for a follow-up on your chronic medical issues, including hypertension and depression. We discussed your recent experiences with headaches, shoulder pain and tingling after a fall, and changes in your vision. We also reviewed your blood pressure and blood sugar levels.  YOUR PLAN: -CHRONIC NOCTURNAL HEADACHE: You have been experiencing new throbbing headaches at night without nausea, vomiting, or blurred vision. To rule out serious conditions like a brain tumor or intracranial hypertension, we will order a CT or MRI of your head and refer you to an eye specialist and a neurologist for further evaluation.  -LEFT UPPER EXTREMITY AND CHEST PARESTHESIA AFTER FALL: You have tingling in your left arm and chest after a fall. To ensure there are no serious issues, we will refer you to a heart specialist and a neurologist for further tests.  -DEPRESSION AND GENERALIZED ANXIETY DISORDER: You stopped taking sertraline  due to headaches, but your depression and anxiety symptoms persist. We will start you on Wellbutrin 150 mg extended release once daily and refer you to behavioral health for counseling. Please monitor for any thoughts of self-harm and seek immediate care if they occur.  -ESSENTIAL HYPERTENSION: Your blood pressure is slightly above the target range. We will continue your current medication, amlodipine  10 mg daily, and add valsartan 40 mg daily to help lower your blood pressure. Please continue to monitor your blood pressure regularly with a goal of 130/80 or lower.  -PREDIABETES: Your A1c level is 5.7%, which is consistent with prediabetes. To prevent progression, we recommend dietary changes such as avoiding sugary drinks, reducing portion sizes of white carbs, and incorporating fresh fruits and vegetables. Whole grain wheat bread is a better option than white bread.  -IMPAIRED NEAR VISION: You have noticed difficulty with near vision. We will refer you for an  eye exam to determine the cause and appropriate treatment.  INSTRUCTIONS: 1. Schedule a CT or MRI of your head. 2. Schedule an appointment with an eye specialist for an eye exam. 3. Schedule an appointment with a neurologist for further evaluation of your headaches and tingling. 4. Schedule an appointment with a heart specialist to rule out any cardiac causes for your tingling. 5. Start taking Wellbutrin 150 mg extended release once daily for your depression and anxiety. 6. Continue taking amlodipine  10 mg daily and add valsartan 40 mg daily for your blood pressure. 7. Monitor your blood pressure regularly with a goal of 130/80 or lower. 8. Follow the dietary recommendations to manage your prediabetes. 9. Monitor for any thoughts of self-harm and seek immediate care if they occur.                      Contains text generated by [employer]bridge.                                 Contains text generated by [employer]bridge.

## 2024-03-21 ENCOUNTER — Encounter: Payer: Self-pay | Admitting: Neurology

## 2024-03-21 NOTE — Progress Notes (Signed)
 Cardiology Office Note   Date:  03/30/2024  ID:  GUYLA BLESS, [name] 24-Nov-1980, MRN [id number] PCP: Vicci Barnie NOVAK, MD  [facility] Providers Cardiologist:  None    History of Present Illness Leah Baker is a 43 y.o. female with a past medical history of HTN, hemorrhoids, depression, alopecia, anxiety. Patient presents today for evaluation of chest pain   Patient had been seen in the ED 03/07/24. She had fallen 2 days prior and struck her left shoulder on an ottoman. She had shooting pain down her left arm that occurred when she tried to move it. No chest pain. Left shoulder was tender to palpation and there was pain with abduction and rotation. Shoulder X-ray normal. CXR normal. EKG showed sinus tachycardia with HR 107 BPM   She was later seen by her PCP. She reported chest pain and was referred to cardiology   Today, patient tells me that a few weeks ago she was getting out of bed when she tripped on her phone charger.  She ended up catching herself with her left arm.  Started to have left shoulder pain after this.  Later in the day, she also started to have a feeling of numbness/tingling in her left fingers and arm.  She has continued to have some left shoulder soreness as well as the numbness/tingling in her left arm.  Notes that a couple of times per day, she will randomly start to feel some tingling in her left fingers.  This spreads up her left arm and into her left shoulder/chest.  She has noted that if she holds her arm up in the air, the tingling and pain seem to resolve quickly.  She notes that this occurs randomly.  She does not notice that symptoms worsen with exertion.  She exercises at least twice per week by going on long walks.  Able to tolerate walking without chest pain or shortness of breath.  She denies palpitations, dizziness, syncope, near syncope.   Patient's mother died of a heart attack when she was 43 years old.  Her father had congestive heart failure.   She does not smoke.  Has a history of high blood pressure and is on medications for this, blood pressure well-controlled.  Does not know if she has a history of hyperlipidemia.   Studies Reviewed   Risk Assessment/Calculations           Physical Exam VS:  BP 110/70   Pulse 90   Ht 5' 2 (1.575 m)   Wt 173 lb 3.2 oz (78.6 kg)   SpO2 100%   BMI 31.68 kg/m        Wt Readings from Last 3 Encounters:  03/30/24 173 lb 3.2 oz (78.6 kg)  03/17/24 172 lb (78 kg)  03/07/24 163 lb (73.9 kg)    GEN: Well nourished, well developed in no acute distress. Sitting comfortably on the exam table  NECK: No JVD; No carotid bruits CARDIAC:  RRR, no murmurs, rubs, gallops. Radial pulses 2+ bilaterally  RESPIRATORY:  Clear to auscultation without rales, wheezing or rhonchi. Normal WOB on room air   ABDOMEN: Soft, non-tender, non-distended EXTREMITIES:  No edema in BLE; No deformity   ASSESSMENT AND PLAN  Left Arm Pain  Family history of CAD  - Patient tripped over a phone cord a few weeks ago and fell, catching herself with her left arm.  Since then has had left shoulder pain.  Also has numbness that starts in her left fingers,  spreads up her left arm into her shoulder, and into her chest.  With this gets a mild tightness in her chest. - Numbness in fingers/arm occurs randomly.  Symptoms resolve if she holds her arm above her head - Patient exercises by walking a few days per week.  Able to tolerate exertion without chest pain or shortness of breath. - EKG 03/07/2024 showed sinus tachycardia with heart rate 107 bpm, no ischemic changes -  Patient does not smoke, no history of diabetes or hyperlipidemia.  Does have history of high blood pressure which has been well-controlled. - Pain very atypical for cardiac cause.  Suspect musculoskeletal/nerve pain after her fall - Patient does have family history of premature cardiac disease.  Her mom had a heart attack when she was 48. - Ordered coronary  calcium score  - Ordered fasting lipid panel and CMP  Hypertension -BP well-controlled.  No symptoms of orthostatic hypotension -Continue amlodipine  10 mg daily and valsartan 40 mg daily -Ordered CMP   Dispo: Follow up with Dr. Lavona PRN pending coronary CT results   Signed, Rollo FABIENE Louder, PA-C

## 2024-03-30 ENCOUNTER — Encounter: Payer: Self-pay | Admitting: Cardiology

## 2024-03-30 ENCOUNTER — Ambulatory Visit: Attending: Cardiology | Admitting: Cardiology

## 2024-03-30 ENCOUNTER — Other Ambulatory Visit (HOSPITAL_COMMUNITY)

## 2024-03-30 VITALS — BP 110/70 | HR 90 | Ht 62.0 in | Wt 173.2 lb

## 2024-03-30 DIAGNOSIS — Z79899 Other long term (current) drug therapy: Secondary | ICD-10-CM | POA: Insufficient documentation

## 2024-03-30 DIAGNOSIS — I10 Essential (primary) hypertension: Secondary | ICD-10-CM | POA: Insufficient documentation

## 2024-03-30 DIAGNOSIS — M79602 Pain in left arm: Secondary | ICD-10-CM | POA: Diagnosis not present

## 2024-03-30 LAB — LIPID PANEL

## 2024-03-30 NOTE — Patient Instructions (Signed)
 Medication Instructions:  Your physician recommends that you continue on your current medications as directed. Please refer to the Current Medication list given to you today.  *If you need a refill on your cardiac medications before your next appointment, please call your pharmacy*  Lab Work: TODAY:  CMET & LIPID  If you have labs (blood work) drawn today and your tests are completely normal, you will receive your results only by: MyChart Message (if you have MyChart) OR A paper copy in the mail If you have any lab test that is abnormal or we need to change your treatment, we will call you to review the results.  Testing/Procedures: Your physician recommends that you have a CT Calcium Scoring Study.  Follow-Up: At [facility], you and your health needs are our priority.  As part of our continuing mission to provide you with exceptional heart care, our providers are all part of one team.  This team includes your primary Cardiologist (physician) and Advanced Practice Providers or APPs (Physician Assistants and Nurse Practitioners) who all work together to provide you with the care you need, when you need it.  Your next appointment:   As needed   Provider:   Rollo Louder, PA-C          We recommend signing up for the patient portal called MyChart.  Sign up information is provided on this After Visit Summary.  MyChart is used to connect with patients for Virtual Visits (Telemedicine).  Patients are able to view lab/test results, encounter notes, upcoming appointments, etc.  Non-urgent messages can be sent to your provider as well.   To learn more about what you can do with MyChart, go to [email or web address].   Other Instructions

## 2024-03-31 ENCOUNTER — Ambulatory Visit: Payer: Self-pay | Admitting: Cardiology

## 2024-03-31 DIAGNOSIS — Z79899 Other long term (current) drug therapy: Secondary | ICD-10-CM

## 2024-03-31 DIAGNOSIS — I10 Essential (primary) hypertension: Secondary | ICD-10-CM

## 2024-03-31 LAB — COMPREHENSIVE METABOLIC PANEL WITH GFR
ALT: 19 IU/L (ref 0–32)
AST: 21 IU/L (ref 0–40)
Albumin: 4.6 g/dL (ref 3.9–4.9)
Alkaline Phosphatase: 48 IU/L (ref 41–116)
BUN/Creatinine Ratio: 10 (ref 9–23)
BUN: 9 mg/dL (ref 6–24)
Bilirubin Total: <0.2 mg/dL (ref 0.0–1.2)
CO2: 19 mmol/L — AB (ref 20–29)
Calcium: 9.7 mg/dL (ref 8.7–10.2)
Chloride: 106 mmol/L (ref 96–106)
Creatinine, Ser: 0.89 mg/dL (ref 0.57–1.00)
Globulin, Total: 2.4 g/dL (ref 1.5–4.5)
Glucose: 87 mg/dL (ref 70–99)
Potassium: 4.2 mmol/L (ref 3.5–5.2)
Sodium: 141 mmol/L (ref 134–144)
Total Protein: 7 g/dL (ref 6.0–8.5)
eGFR: 81 mL/min/1.73 (ref >59)

## 2024-03-31 LAB — LIPID PANEL
Cholesterol, Total: 220 mg/dL — AB (ref 100–199)
HDL: 58 mg/dL (ref >39)
LDL CALC COMMENT:: 3.8 ratio (ref 0.0–4.4)
LDL Chol Calc (NIH): 139 mg/dL — AB (ref 0–99)
Triglycerides: 131 mg/dL (ref 0–149)
VLDL Cholesterol Cal: 23 mg/dL (ref 5–40)

## 2024-03-31 NOTE — Telephone Encounter (Signed)
 Lab orders entered (future, for 3 months out). Need to send in RX once pt replies.

## 2024-04-01 MED ORDER — ROSUVASTATIN CALCIUM 10 MG PO TABS: 10.0000 mg | ORAL_TABLET | Freq: Every day | ORAL | 3 refills | Status: AC

## 2024-04-13 ENCOUNTER — Ambulatory Visit (HOSPITAL_COMMUNITY)

## 2024-04-18 ENCOUNTER — Encounter: Payer: Self-pay | Admitting: Internal Medicine

## 2024-04-20 ENCOUNTER — Encounter: Payer: Self-pay | Admitting: Internal Medicine

## 2024-04-20 ENCOUNTER — Other Ambulatory Visit: Payer: Self-pay | Admitting: Internal Medicine

## 2024-04-20 MED ORDER — DIAZEPAM 2 MG PO TABS: ORAL_TABLET | ORAL | 0 refills | Status: AC

## 2024-04-26 ENCOUNTER — Ambulatory Visit
Admission: RE | Admit: 2024-04-26 | Discharge: 2024-04-26 | Disposition: A | Source: Ambulatory Visit | Attending: Internal Medicine | Admitting: Internal Medicine

## 2024-04-26 DIAGNOSIS — R519 Headache, unspecified: Secondary | ICD-10-CM

## 2024-04-26 DIAGNOSIS — G932 Benign intracranial hypertension: Secondary | ICD-10-CM | POA: Diagnosis not present

## 2024-04-26 MED ORDER — GADOPICLENOL 0.5 MMOL/ML IV SOLN
8.0000 mL | Freq: Once | INTRAVENOUS | Status: AC | PRN
  Administered 2024-04-26: 8 mL via INTRAVENOUS

## 2024-05-02 NOTE — Telephone Encounter (Unsigned)
 Copied from CRM #[id number]. Topic: Appointments - Scheduling Inquiry for Clinic >> May 02, 2024  9:48 AM Terri MATSU wrote: Reason for CRM: Patient wanted to know if she could come in virtually for her appointment tomorrow because her whole house is sick. Can Dr.Johnson or her nurse call her to confirm and then schedule it for her. Callback number [phone]

## 2024-05-03 ENCOUNTER — Ambulatory Visit: Admitting: Internal Medicine

## 2024-05-03 ENCOUNTER — Encounter: Payer: Self-pay | Admitting: Internal Medicine

## 2024-05-03 DIAGNOSIS — J069 Acute upper respiratory infection, unspecified: Secondary | ICD-10-CM | POA: Diagnosis not present

## 2024-05-03 DIAGNOSIS — E236 Other disorders of pituitary gland: Secondary | ICD-10-CM

## 2024-05-03 DIAGNOSIS — I10 Essential (primary) hypertension: Secondary | ICD-10-CM | POA: Diagnosis not present

## 2024-05-03 DIAGNOSIS — R519 Headache, unspecified: Secondary | ICD-10-CM | POA: Diagnosis not present

## 2024-05-03 DIAGNOSIS — F330 Major depressive disorder, recurrent, mild: Secondary | ICD-10-CM | POA: Diagnosis not present

## 2024-05-03 MED ORDER — BENZONATATE 100 MG PO CAPS: 100.0000 mg | ORAL_CAPSULE | Freq: Three times a day (TID) | ORAL | 0 refills | Status: DC | PRN

## 2024-05-03 NOTE — Telephone Encounter (Signed)
 Patient seen for a virtual appointment on 05/03/24. Please see OV for more information. No further assistance needed at this time.

## 2024-05-03 NOTE — Progress Notes (Signed)
 Patient ID: Leah Baker, female   [name]: 1980/11/08, 43 y.o.   MRN: [id number] Virtual Visit via Video Note  I connected with Leah Baker on 05/03/2024 at 10:57 AM by a video enabled telemedicine application and verified that I am speaking with the correct person using two identifiers.  Location: Patient: home Provider: Office   I discussed the limitations of evaluation and management by telemedicine and the availability of in person appointments. The patient expressed understanding and agreed to proceed.  History of Present Illness: History of HTN, hemorrhoids, anemia, mild thrombocytosis, anxiety/depression, vitamin D  deficiency   Discussed the use of AI scribe software for clinical note transcription with the patient, who gave verbal consent to proceed.  History of Present Illness Leah Baker is a 43 year old female with hypertension and depression who presents with persistent nighttime headaches and respiratory symptoms.  She has been experiencing nighttime headaches, initially thought to be related to Zoloft . After discontinuing Zoloft  and switching to bupropion , an MRI was performed, revealing no mass or tumor but showing a partial empty sella. She is scheduled to see a neurologist on January 27 and an ophthalmologist in March, and is on the cancellation list to secure an earlier appointment.  HTN: Her hypertension is managed with amlodipine  10 mg and valsartan  40 mg, the latter being added on last visit in 03/2024. Reports significant improvement in blood pressure readings of 118/82, 122/84, and 117/80. She monitors her blood pressure twice daily. Reports headaches have improved with BP being better controlled.  Regarding her depression, we switch from Zoloft  to bupropion  but has not noticed a significant change in symptoms yet. She started bupropion  about a week and a half after her last visit and plans to continue it for further assessment of its effectiveness. She is  tolerating the medication.  C/O CP on last visit and was referred to cardiology. She was seen and a coronary CT scan was ordered. However, due to insurance issues, the test has not been completed. She has started a cholesterol medication.  Recently, she developed respiratory symptoms, including a cough that worsens at night, productive of phlegm, and a fever of 101.66F. Started 3 days ago. No ear pain or shortness of breath, but her ears feel 'juicy.' She has been using over-the-counter medications like Nyquil, Mucinex, DayQuil, and Delsym without relief. Her daughter, who has similar symptoms, was tested for COVID and flu, with results pending. At-home COVID tests for both were negative 3 days ago. She has not received a flu shot in years due to a past adverse reaction.  Outpatient Encounter Medications as of 05/03/2024  Medication Sig   benzonatate  (TESSALON ) 100 MG capsule Take 1 capsule (100 mg total) by mouth 3 (three) times daily as needed for cough.   acetaminophen  (TYLENOL ) 500 MG tablet Take 1 tablet (500 mg total) by mouth every 6 (six) hours as needed.   amLODipine  (NORVASC ) 10 MG tablet Take 1 tablet (10 mg total) by mouth daily.   buPROPion  (WELLBUTRIN  XL) 150 MG 24 hr tablet Take 1 tablet (150 mg total) by mouth daily.   busPIRone  (BUSPAR ) 10 MG tablet Take 1 tablet (10 mg total) by mouth 3 (three) times daily.   cetirizine  (ZYRTEC  ALLERGY) 10 MG tablet Take 1 tablet (10 mg total) by mouth daily.   diazepam  (VALIUM ) 2 MG tablet Take 1 tab 1/2-1 hr prior to MRI   diclofenac  Sodium (VOLTAREN ) 1 % GEL Apply 2 g topically 4 (four) times daily.  Ferrous Sulfate  (IRON ) 325 (65 Fe) MG TABS Take by mouth in the morning and at bedtime.   fluticasone  (FLONASE ) 50 MCG/ACT nasal spray Place 2 sprays into both nostrils daily.   Menthol -Camphor (TIGER BALM ARTHRITIS RUB EX) Apply 1 application topically daily as needed (knee pain).   norethindrone  (AYGESTIN ) 5 MG tablet Take 3 tablets (15 mg  total) by mouth daily.   omeprazole  (PRILOSEC) 20 MG capsule Take 1 capsule (20 mg total) by mouth daily.   oxybutynin  (DITROPAN -XL) 5 MG 24 hr tablet Take 1 tablet (5 mg total) by mouth at bedtime.   Psyllium (METAMUCIL PO) Take 1 tablet by mouth daily.   rosuvastatin  (CRESTOR ) 10 MG tablet Take 1 tablet (10 mg total) by mouth daily.   sertraline  (ZOLOFT ) 100 MG tablet Take 1 tablet (100 mg total) by mouth at bedtime.   valsartan  (DIOVAN ) 40 MG tablet Take 1 tablet (40 mg total) by mouth daily.   Facility-Administered Encounter Medications as of 05/03/2024  Medication   bupivacaine  liposome (EXPAREL ) 1.3 % injection 266 mg      Observations/Objective: Middle age AAF sitting in chair in NAD  Assessment and Plan: 1. Nocturnal headaches (Primary) -HA have improved with better BP control and pt reports no nocturnal HA in past 2 nights -MRI showed partial empty sella which can be an incidental finding without clinical significance but can also be assoc with intracranial HTN. She has upcoming appt with neurology. Will see if our referral coordinator can get [facility] to move up her eye appt  2. Major depressive disorder, recurrent episode, mild Discussed increasing dose of Wellbutrin  but pt wants to give current dose a few more wks  3. Essential hypertension Improved with goal being 130/80 or lower. Continue with amlodipine  10 mg and valsartan  40 mg,  4. Empty sella See #1 above  5. Viral upper respiratory illness Symptoms suggest viral etiology. Negative COVID test. Family members with similar symptoms. - Prescribed Tessalon  Perles for cough. - Advised use of Tylenol  for fever and body aches. - Instructed to repeat COVID test if daughter tests positive for COVID. - Instructed to notify me if daughter tests positive for flu so that she can be treated as well - benzonatate  (TESSALON ) 100 MG capsule; Take 1 capsule (100 mg total) by mouth 3 (three) times daily as needed for cough.   Dispense: 30 capsule; Refill: 0   Follow Up Instructions: 3 mths   I discussed the assessment and treatment plan with the patient. The patient was provided an opportunity to ask questions and all were answered. The patient agreed with the plan and demonstrated an understanding of the instructions.   The patient was advised to call back or seek an in-person evaluation if the symptoms worsen or if the condition fails to improve as anticipated.  I spent  21 minutes dedicated to the care of this patient on the date of this encounter to include previsit review of my last note, MRI report, face to face time with pt discussing dx and management and post visit entering order  This note has been created with Dragon speech recognition software and paediatric nurse. Any transcriptional errors are unintentional.  Barnie Louder, MD

## 2024-05-04 NOTE — Addendum Note (Signed)
 Addended by: VICCI SOBER B on: 05/04/2024 08:37 AM   Modules accepted: Level of Service

## 2024-05-30 ENCOUNTER — Other Ambulatory Visit: Payer: Self-pay | Admitting: Internal Medicine

## 2024-05-30 DIAGNOSIS — J069 Acute upper respiratory infection, unspecified: Secondary | ICD-10-CM

## 2024-05-30 MED ORDER — BENZONATATE 100 MG PO CAPS: 100.0000 mg | ORAL_CAPSULE | Freq: Three times a day (TID) | ORAL | 0 refills | Status: AC | PRN

## 2024-06-14 ENCOUNTER — Other Ambulatory Visit: Payer: Self-pay | Admitting: *Deleted

## 2024-06-14 DIAGNOSIS — Z79899 Other long term (current) drug therapy: Secondary | ICD-10-CM

## 2024-06-14 DIAGNOSIS — I10 Essential (primary) hypertension: Secondary | ICD-10-CM

## 2024-06-28 ENCOUNTER — Ambulatory Visit: Admitting: Neurology

## 2024-08-09 ENCOUNTER — Ambulatory Visit: Admitting: Neurology
